# Patient Record
Sex: Male | Born: 1955 | Race: White | Hispanic: No | Marital: Single | Smoking: Current every day smoker
Health system: Southern US, Community
[De-identification: ages and names within clinical notes are randomized; demographics above are authoritative.]

## PROBLEM LIST (undated history)

## (undated) DIAGNOSIS — F209 Schizophrenia, unspecified: Secondary | ICD-10-CM

## (undated) DIAGNOSIS — F0390 Unspecified dementia without behavioral disturbance: Secondary | ICD-10-CM

## (undated) HISTORY — PX: SHOULDER SURGERY: SHX246

---

## 2002-03-22 ENCOUNTER — Encounter: Payer: Self-pay | Admitting: Endocrinology

## 2002-03-22 ENCOUNTER — Ambulatory Visit (HOSPITAL_COMMUNITY): Admission: RE | Admit: 2002-03-22 | Discharge: 2002-03-22 | Payer: Self-pay | Admitting: Endocrinology

## 2002-04-04 ENCOUNTER — Encounter: Payer: Self-pay | Admitting: Endocrinology

## 2002-04-04 ENCOUNTER — Ambulatory Visit (HOSPITAL_COMMUNITY): Admission: RE | Admit: 2002-04-04 | Discharge: 2002-04-04 | Payer: Self-pay | Admitting: Endocrinology

## 2004-01-15 ENCOUNTER — Ambulatory Visit: Payer: Self-pay | Admitting: Cardiology

## 2004-01-21 ENCOUNTER — Ambulatory Visit: Payer: Self-pay | Admitting: Cardiology

## 2004-01-29 ENCOUNTER — Ambulatory Visit: Payer: Self-pay | Admitting: Cardiology

## 2004-02-10 ENCOUNTER — Ambulatory Visit: Payer: Self-pay | Admitting: Cardiology

## 2004-03-30 ENCOUNTER — Ambulatory Visit: Payer: Self-pay | Admitting: Cardiology

## 2005-12-30 ENCOUNTER — Emergency Department (HOSPITAL_COMMUNITY): Admission: EM | Admit: 2005-12-30 | Discharge: 2005-12-30 | Payer: Self-pay | Admitting: Emergency Medicine

## 2007-10-19 ENCOUNTER — Ambulatory Visit: Payer: Self-pay | Admitting: Psychiatry

## 2007-10-19 ENCOUNTER — Inpatient Hospital Stay (HOSPITAL_COMMUNITY): Admission: AD | Admit: 2007-10-19 | Discharge: 2007-11-17 | Payer: Self-pay | Admitting: Psychiatry

## 2007-10-26 ENCOUNTER — Other Ambulatory Visit: Payer: Self-pay | Admitting: Emergency Medicine

## 2007-11-08 ENCOUNTER — Other Ambulatory Visit (HOSPITAL_COMMUNITY): Payer: Self-pay | Admitting: Psychiatry

## 2007-11-11 ENCOUNTER — Other Ambulatory Visit (HOSPITAL_COMMUNITY): Payer: Self-pay | Admitting: Psychiatry

## 2007-11-12 ENCOUNTER — Other Ambulatory Visit (HOSPITAL_COMMUNITY): Payer: Self-pay | Admitting: Psychiatry

## 2008-01-10 ENCOUNTER — Emergency Department (HOSPITAL_COMMUNITY): Admission: EM | Admit: 2008-01-10 | Discharge: 2008-01-10 | Payer: Self-pay | Admitting: Emergency Medicine

## 2008-09-14 ENCOUNTER — Ambulatory Visit: Payer: Self-pay | Admitting: Psychiatry

## 2008-09-14 ENCOUNTER — Other Ambulatory Visit: Payer: Self-pay

## 2008-09-15 ENCOUNTER — Inpatient Hospital Stay (HOSPITAL_COMMUNITY): Admission: AD | Admit: 2008-09-15 | Discharge: 2008-09-23 | Payer: Self-pay | Admitting: Psychiatry

## 2008-10-11 ENCOUNTER — Emergency Department (HOSPITAL_COMMUNITY): Admission: EM | Admit: 2008-10-11 | Discharge: 2008-10-11 | Payer: Self-pay | Admitting: Emergency Medicine

## 2009-03-22 ENCOUNTER — Other Ambulatory Visit: Payer: Self-pay | Admitting: Emergency Medicine

## 2009-03-22 ENCOUNTER — Ambulatory Visit: Payer: Self-pay | Admitting: Psychiatry

## 2009-03-23 ENCOUNTER — Inpatient Hospital Stay (HOSPITAL_COMMUNITY): Admission: AD | Admit: 2009-03-23 | Discharge: 2009-04-11 | Payer: Self-pay | Admitting: Psychiatry

## 2009-06-27 ENCOUNTER — Other Ambulatory Visit: Payer: Self-pay

## 2009-06-27 ENCOUNTER — Inpatient Hospital Stay (HOSPITAL_COMMUNITY): Admission: EM | Admit: 2009-06-27 | Discharge: 2009-07-25 | Payer: Self-pay | Admitting: Psychiatry

## 2009-06-27 ENCOUNTER — Ambulatory Visit: Payer: Self-pay | Admitting: Psychiatry

## 2009-08-11 ENCOUNTER — Other Ambulatory Visit: Payer: Self-pay | Admitting: Emergency Medicine

## 2009-08-12 ENCOUNTER — Ambulatory Visit: Payer: Self-pay | Admitting: Psychiatry

## 2009-08-12 ENCOUNTER — Inpatient Hospital Stay (HOSPITAL_COMMUNITY): Admission: AD | Admit: 2009-08-12 | Discharge: 2009-09-13 | Payer: Self-pay | Admitting: Psychiatry

## 2010-05-10 LAB — DIFFERENTIAL
Basophils Absolute: 0 10*3/uL (ref 0.0–0.1)
Basophils Absolute: 0 10*3/uL (ref 0.0–0.1)
Basophils Relative: 0 % (ref 0–1)
Eosinophils Absolute: 0 10*3/uL (ref 0.0–0.7)
Eosinophils Relative: 0 % (ref 0–5)
Eosinophils Relative: 0 % (ref 0–5)
Lymphocytes Relative: 10 % — ABNORMAL LOW (ref 12–46)
Lymphocytes Relative: 12 % (ref 12–46)
Lymphs Abs: 1 10*3/uL (ref 0.7–4.0)
Lymphs Abs: 1.5 10*3/uL (ref 0.7–4.0)
Monocytes Absolute: 0.4 10*3/uL (ref 0.1–1.0)
Monocytes Relative: 4 % (ref 3–12)
Neutro Abs: 8.7 10*3/uL — ABNORMAL HIGH (ref 1.7–7.7)
Neutro Abs: 9.8 10*3/uL — ABNORMAL HIGH (ref 1.7–7.7)
Neutrophils Relative %: 86 % — ABNORMAL HIGH (ref 43–77)
Neutrophils Relative %: 81 % — ABNORMAL HIGH (ref 43–77)

## 2010-05-10 LAB — BASIC METABOLIC PANEL
BUN: 3 mg/dL — ABNORMAL LOW (ref 6–23)
CO2: 28 mEq/L (ref 19–32)
Calcium: 9.6 mg/dL (ref 8.4–10.5)
Chloride: 104 mEq/L (ref 96–112)
Creatinine, Ser: 0.93 mg/dL (ref 0.4–1.5)
GFR calc Af Amer: >60 mL/min (ref >60)
GFR calc non Af Amer: >60 mL/min (ref >60)
Glucose, Bld: 143 mg/dL — ABNORMAL HIGH (ref 70–99)
Potassium: 3.6 mEq/L (ref 3.5–5.1)
Sodium: 140 mEq/L (ref 135–145)

## 2010-05-10 LAB — COMPREHENSIVE METABOLIC PANEL
AST: 65 U/L — ABNORMAL HIGH (ref 0–37)
BUN: 15 mg/dL (ref 6–23)
CO2: 28 mEq/L (ref 19–32)
Calcium: 9.9 mg/dL (ref 8.4–10.5)
Chloride: 103 mEq/L (ref 96–112)
Creatinine, Ser: 1.06 mg/dL (ref 0.4–1.5)
GFR calc Af Amer: >60 mL/min (ref >60)
GFR calc non Af Amer: >60 mL/min (ref >60)
Total Bilirubin: 1.4 mg/dL — ABNORMAL HIGH (ref 0.3–1.2)

## 2010-05-10 LAB — RAPID URINE DRUG SCREEN, HOSP PERFORMED
Amphetamines: NOT DETECTED
Amphetamines: NOT DETECTED
Barbiturates: NOT DETECTED
Barbiturates: NOT DETECTED
Benzodiazepines: NOT DETECTED
Benzodiazepines: POSITIVE — AB
Cocaine: NOT DETECTED
Cocaine: NOT DETECTED
Opiates: NOT DETECTED
Opiates: NOT DETECTED
Tetrahydrocannabinol: NOT DETECTED

## 2010-05-10 LAB — URINALYSIS, ROUTINE W REFLEX MICROSCOPIC
Bilirubin Urine: NEGATIVE
Glucose, UA: NEGATIVE mg/dL
Hgb urine dipstick: NEGATIVE
Ketones, ur: NEGATIVE mg/dL
Nitrite: NEGATIVE
Protein, ur: NEGATIVE mg/dL
Specific Gravity, Urine: <1.005 — ABNORMAL LOW (ref 1.005–1.030)
Urobilinogen, UA: 0.2 mg/dL (ref 0.0–1.0)
pH: 5.5 (ref 5.0–8.0)

## 2010-05-10 LAB — ACETAMINOPHEN LEVEL: Acetaminophen (Tylenol), Serum: <10 ug/mL — ABNORMAL LOW (ref 10–30)

## 2010-05-10 LAB — CBC
HCT: 49.3 % (ref 39.0–52.0)
HCT: 48 % (ref 39.0–52.0)
Hemoglobin: 17.1 g/dL — ABNORMAL HIGH (ref 13.0–17.0)
MCHC: 34.6 g/dL (ref 30.0–36.0)
MCHC: 34 g/dL (ref 30.0–36.0)
MCV: 91.4 fL (ref 78.0–100.0)
MCV: 92.4 fL (ref 78.0–100.0)
Platelets: 189 10*3/uL (ref 150–400)
RBC: 5.4 MIL/uL (ref 4.22–5.81)
RBC: 5.2 MIL/uL (ref 4.22–5.81)
RDW: 12.7 % (ref 11.5–15.5)
WBC: 10.1 10*3/uL (ref 4.0–10.5)
WBC: 12.1 10*3/uL — ABNORMAL HIGH (ref 4.0–10.5)

## 2010-05-10 LAB — ETHANOL: Alcohol, Ethyl (B): <5 mg/dL (ref 0–10)

## 2010-05-10 LAB — TSH: TSH: 19.697 u[IU]/mL — ABNORMAL HIGH (ref 0.350–4.500)

## 2010-05-12 LAB — BASIC METABOLIC PANEL
BUN: 7 mg/dL (ref 6–23)
CO2: 30 mEq/L (ref 19–32)
Chloride: 102 mEq/L (ref 96–112)
Creatinine, Ser: 0.76 mg/dL (ref 0.4–1.5)
Potassium: 3.9 mEq/L (ref 3.5–5.1)

## 2010-05-12 LAB — CBC
HCT: 44.6 % (ref 39.0–52.0)
MCHC: 35.7 g/dL (ref 30.0–36.0)
MCV: 91.8 fL (ref 78.0–100.0)
Platelets: 148 10*3/uL — ABNORMAL LOW (ref 150–400)
WBC: 5.1 10*3/uL (ref 4.0–10.5)

## 2010-05-12 LAB — DIFFERENTIAL
Basophils Relative: 0 % (ref 0–1)
Eosinophils Absolute: 0 10*3/uL (ref 0.0–0.7)
Eosinophils Relative: 1 % (ref 0–5)
Lymphs Abs: 1.3 10*3/uL (ref 0.7–4.0)
Monocytes Relative: 7 % (ref 3–12)

## 2010-05-12 LAB — GLUCOSE, RANDOM: Glucose, Bld: 138 mg/dL — ABNORMAL HIGH (ref 70–99)

## 2010-05-12 LAB — HEMOGLOBIN A1C
Hgb A1c MFr Bld: 5.8 % — ABNORMAL HIGH (ref <5.7)
Mean Plasma Glucose: 120 mg/dL — ABNORMAL HIGH (ref <117)

## 2010-05-12 LAB — RAPID URINE DRUG SCREEN, HOSP PERFORMED
Amphetamines: NOT DETECTED
Barbiturates: NOT DETECTED
Opiates: NOT DETECTED

## 2010-05-12 LAB — ETHANOL: Alcohol, Ethyl (B): <5 mg/dL (ref 0–10)

## 2010-05-12 LAB — TSH: TSH: 4.925 u[IU]/mL — ABNORMAL HIGH (ref 0.350–4.500)

## 2010-05-30 LAB — BASIC METABOLIC PANEL
CO2: 31 mEq/L (ref 19–32)
Calcium: 9.7 mg/dL (ref 8.4–10.5)
Chloride: 106 mEq/L (ref 96–112)
GFR calc Af Amer: >60 mL/min (ref >60)
Sodium: 142 mEq/L (ref 135–145)

## 2010-05-30 LAB — CBC
Hemoglobin: 16.1 g/dL (ref 13.0–17.0)
MCHC: 35 g/dL (ref 30.0–36.0)
RBC: 5.02 MIL/uL (ref 4.22–5.81)
WBC: 7.4 10*3/uL (ref 4.0–10.5)

## 2010-05-30 LAB — URINALYSIS, ROUTINE W REFLEX MICROSCOPIC
Bilirubin Urine: NEGATIVE
Hgb urine dipstick: NEGATIVE
Nitrite: NEGATIVE
Specific Gravity, Urine: 1.01 (ref 1.005–1.030)
pH: 7.5 (ref 5.0–8.0)

## 2010-05-30 LAB — ETHANOL: Alcohol, Ethyl (B): <5 mg/dL (ref 0–10)

## 2010-05-30 LAB — DIFFERENTIAL
Basophils Relative: 1 % (ref 0–1)
Lymphs Abs: 2.2 10*3/uL (ref 0.7–4.0)
Monocytes Absolute: 0.5 10*3/uL (ref 0.1–1.0)
Monocytes Relative: 7 % (ref 3–12)
Neutro Abs: 4.5 10*3/uL (ref 1.7–7.7)

## 2010-05-30 LAB — RAPID URINE DRUG SCREEN, HOSP PERFORMED
Barbiturates: NOT DETECTED
Opiates: NOT DETECTED

## 2010-05-31 LAB — DIFFERENTIAL
Basophils Absolute: 0 10*3/uL (ref 0.0–0.1)
Basophils Relative: 1 % (ref 0–1)
Eosinophils Relative: 2 % (ref 0–5)
Monocytes Absolute: 0.4 10*3/uL (ref 0.1–1.0)
Monocytes Relative: 5 % (ref 3–12)

## 2010-05-31 LAB — BASIC METABOLIC PANEL
CO2: 28 mEq/L (ref 19–32)
Calcium: 9.1 mg/dL (ref 8.4–10.5)
Chloride: 103 mEq/L (ref 96–112)
GFR calc Af Amer: >60 mL/min (ref >60)
Glucose, Bld: 127 mg/dL — ABNORMAL HIGH (ref 70–99)
Potassium: 3.6 mEq/L (ref 3.5–5.1)
Sodium: 139 mEq/L (ref 135–145)

## 2010-05-31 LAB — CBC
HCT: 45.4 % (ref 39.0–52.0)
Hemoglobin: 16 g/dL (ref 13.0–17.0)
MCHC: 35.2 g/dL (ref 30.0–36.0)
MCV: 91.5 fL (ref 78.0–100.0)
RBC: 4.96 MIL/uL (ref 4.22–5.81)
RDW: 14.2 % (ref 11.5–15.5)

## 2010-05-31 LAB — RAPID URINE DRUG SCREEN, HOSP PERFORMED
Cocaine: NOT DETECTED
Tetrahydrocannabinol: NOT DETECTED

## 2010-07-07 NOTE — Discharge Summary (Signed)
[name], MAURIELLO               ACCOUNT NO.:  [id number]   MEDICAL RECORD NO.:  [id number]          PATIENT TYPE:  IPS   LOCATION:  0401                          FACILITY:  BH   PHYSICIAN:  Anselm Jungling, MD  DATE OF BIRTH:  1955-04-26   DATE OF ADMISSION:  10/19/2007  DATE OF DISCHARGE:  10/26/2007                               DISCHARGE SUMMARY   IDENTIFYING DATA AND REASON FOR ADMISSION:  The patient is a 55 year old  single white male who was admitted due to exacerbation of symptoms of  chronic paranoid schizophrenia.  He came to [place] as a client of [facility] in [place].  He lives alone with this cat in a trailer,  and usually takes Prolixin prescribed by Dr. [place]ena Edis.  However, the  patient had been off his medication for an undetermined period of time.  The reasons for this were not clear.  Nonetheless, the patient presented  with strong auditory hallucinations and thought disorganization.  Please  refer to the admission note for further details pertaining to the  symptoms, circumstances and history that led to his hospitalization.  He  was given an initial Axis I diagnosis of schizophrenia, chronic paranoid  type, acute exacerbation.   MEDICAL AND LABORATORY:  The patient was medically and physically  assessed by the psychiatric nurse practitioner.  He was in good health  without any active or chronic medical problems.  There were no  significant medical issues.   HOSPITAL COURSE:  The patient was admitted to the adult inpatient  psychiatric service.  He presented as a well-nourished, normally-  developed, but disheveled male with disorganized thinking and impaired  communication.  He acknowledged auditory hallucinations.  He was  pleasant and cooperative, however.   We learned that he had received a Prolixin Decanoate injection on  approximately October 09, 2007, when his sister had taken him back to the  mental health center to get him caught up on his  medications.  Nonetheless, Prolixin Decanoate 25 mg was given here on October 21, 2007.  In addition, to more rapidly bring about his stabilization, he was  started on Prolixin orally 10 mg b.i.d.  He had prominent parkinsonian  EPS in response to this, so Cogentin was increased to 2 mg t.i.d. with  very good results.  The patient improved gradually over the week-long  hospitalization.  He was pleasant and cooperative throughout.  By the  eighth hospital day, he appeared appropriate for discharge.  He agreed  to the following aftercare plan.   AFTERCARE:  The patient was to follow up with Dr. [place]ena Edis at [facility],  with an appointment on October 31, 2007.   DISCHARGE MEDICATIONS:  1. Prolixin Decanoate 25 mg IM, last given October 21, 2007; next      dosage to be determined by Dr. [place]ena Edis.  2. Prolixin 10 mg b.i.d.  3. Cogentin 2 mg t.i.d.   DISCHARGE DIAGNOSES:  AXIS I:  Schizophrenia, chronic paranoid type,  acute exacerbation, resolving.  AXIS II:  Deferred.  AXIS III:  No acute or chronic illnesses.  AXIS IV:  Stressors severe.  AXIS V:  GAF on discharge 50.      Anselm Jungling, MD  Electronically Signed     SPB/MEDQ  D:  10/26/2007  T:  10/26/2007  Job:  [id number]

## 2010-07-07 NOTE — H&P (Signed)
NAMEARJAN, [name]               ACCOUNT NO.:  [id number]   MEDICAL RECORD NO.:  [id number]          PATIENT TYPE:  IPS   LOCATION:  0401                          FACILITY:  BH   PHYSICIAN:  Anselm Jungling, MD  DATE OF BIRTH:  10-30-1955   DATE OF ADMISSION:  10/19/2007  DATE OF DISCHARGE:                       PSYCHIATRIC ADMISSION ASSESSMENT   This is a voluntary admission to the services of Dr. Geralyn Flash.   IDENTIFYING INFORMATION:  This is a 55 year old single white male.  Why  is he here?  He states that the police came by and told me they had to  take me somewhere.  The commitment papers indicated that the patient  was actively psychotic and delusional.  He thought people were doing  things to him and that they needed to burn in hell.  He was not supposed  to be sleeping and pacing all night.  His sister is the one who took out  the commitment papers.  It indicated that he had recently been to [employer]. We called [facility], and they stated that he was admitted on  August 23, discharging on August 25.  His discharge medications were  Cogentin 1 mg b.i.d., Seroquel XR 200 mg nightly, Prolixin 5 mg p.o.  nightly, and Klonopin 0.5 mg p.r.n., no number of times a day given.  He  is also thought to get Prolixin Decanoate IM every three weeks, and they  think he is about six weeks out at this point.  He is known to be a  patient at [facility] [place].   SOCIAL HISTORY:  He reports that he graduated high school in 1976.  He  has never married.  He has no children.  He was last employed in the  [employer] in [place].  He lives alone with his cat in a trailer.   FAMILY HISTORY:  Negative.   ALCOHOL/DRUG HISTORY:  Negative.  Indeed, his UDS is completely clean,  and he has no alcohol.   PRIMARY CARE Natalyn Szymanowski:  Dr. Sherril Croon.   PSYCHIATRIST:  Dr. [place]ena Edis.  He can tell me all this.   MEDICAL PROBLEMS:  He has no known medical  problems.   MEDICATIONS:  He knows that he gets Prolixin 25 every 3 weeks.   DRUG ALLERGIES:  No known drug allergies.   POSITIVE PHYSICAL FINDINGS:  He was medically cleared in the ED at [facility].  He had no remarkable findings.  He has no physical complaints.  His vital signs on admission to the unit shows he is 68 inches tall.  He  weighs 180.  Temperature 98.1, blood pressure 134/82 to 110/79.  His  pulse was 115 to 107.  Respirations are 18.  MENTAL STATUS EXAM:  Tonight he is alert.  He was somewhat unkempt.  He  needs to shave and bathe.  He had appropriate clothing on.  He appears  to be adequately nourished.  His speech was brief and halting.  His mood  is anxiously depressed.  His affect is congruent.  He is somewhat  guarded.  Thought processes were clear, rationale, and goal-oriented.  Apparently, he has already had some Haldol today.  Judgment and insight  are fair.  Concentration and memory are superficially intact.  Intelligence is at least average.  He denies being suicidal or  homicidal.  He does not have any recall for any of the statements in the  commitment papers.  Earlier this morning, he did tell Dr. Electa Sniff that  he was hearing people talk to him, and he was religiously preoccupied.  He was found to be pleasant, soft-spoken, cooperative, and he agreed to  resume the Prolixin.   DIAGNOSES:   AXIS I:  Schizophrenic, paranoid.   AXIS II:  None known.   AXIS III:  None known.   AXIS IV:  Apparent noncompliance with medications post discharge.   AXIS V:  25.   PLAN:  Admit for safety and stabilization, to restart his medication,  and to get him back into [facility] as soon as possible.  Estimated length of stay is 3-5 days.      Mickie Leonarda Salon, P.A.-C.      Anselm Jungling, MD  Electronically Signed    MD/MEDQ  D:  10/20/2007  T:  10/20/2007  Job:  [id number]

## 2010-07-07 NOTE — H&P (Signed)
[name], [name]               ACCOUNT NO.:  [id number]   MEDICAL RECORD NO.:  [id number]          PATIENT TYPE:  IPS   LOCATION:  0402                          FACILITY:  BH   PHYSICIAN:  Anselm Jungling, MD  DATE OF BIRTH:  [birth year]   DATE OF ADMISSION:  09/15/2008  DATE OF DISCHARGE:                       PSYCHIATRIC ADMISSION ASSESSMENT   IDENTIFYING INFORMATION:  A 55 year old male, single.  This is a  voluntary admission.   HISTORY OF PRESENT ILLNESS:  Third [facility] admission for this 55 year old  with a history of chronic schizophrenia, paranoid type, who was brought  to the emergency room by his sister and brother-in-law who live across  the road from him.  He says he does not know why he is here.  Was told  that he was acting strange but really has no memory of the events.  Says  that he was minding his own business.  Mostly likes to smoke cigarettes  and watch TV.  The emergency room transcript notes that he was making  statements of having auditory hallucinations with voices telling him to  kill other people.  He denies any hallucinations today.   PAST PSYCHIATRIC HISTORY:  [facility] with [facility].  Currently is followed as an outpatient by mental health.  Reports he has  visited monthly at home for his injections.  Third [facility] admission.  History of schizophrenia, chronic paranoid type.  Has received Prolixin  Decanoate in the past.  Reports he is currently on Haldol Decanoate and  received an injection this past week, he believes.  Says that he drinks  alcohol rarely and denies any drug abuse.   SOCIAL HISTORY:  Never married.  No children.  Graduated high school in  1976.  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  Now disabled  due to his mental illness.   FAMILY HISTORY:  Not available.   HABITS:  Alcohol and drug history noted above.  He does also smoke  tobacco.   MEDICAL HISTORY:  Primary care practitioner, Dr. Sherril Croon, [facility].   MEDICAL PROBLEMS:  None.   PAST MEDICAL HISTORY:  Hyperthyroidism, stabilized.   CURRENT MEDICATIONS:  Benztropine 2 mg b.i.d., Haldol 25 mg IM once  monthly (last dose not known), Klonopin 0.5 mg b.i.d.  He received 20 mg  of Geodon in the emergency room and Ativan 4 mg in divided doses.   DRUG ALLERGIES:  None.   PHYSICAL EXAMINATION:  Physical exam was done in the emergency room as  noted in the record.   LABORATORY STUDIES:  CBC:  WBC 7.3, hemoglobin 16, hematocrit 45.4,  platelets 170,000.  Chemistry:  Sodium 139, potassium 3.6, chloride 103,  carbon dioxide 28, BUN 9, creatinine 0.86, and random glucose 127.  Alcohol level less than 5ive.  Urine drug screen positive for  benzodiazepines.   MENTAL STATUS EXAM:  Fully alert.  Blunted affect.  Somewhat dazed look.  Cooperative.  Good focus.  Asking why he is here.  Reporting that he  wants to go home.  Denying any hallucinations today.  Memory is  basically intact,  although he does not remember acting out or having any  problems with his behavior yesterday.  Insight limited.  No active  suicidal or homicidal thoughts.   DIAGNOSES:  AXIS I:     Schizophrenia, paranoid type, chronic, acute,  rule out acute exacerbation.  AXIS II:    No diagnosis.  AXIS III:   No diagnosis.  AXIS IV:    Deferred.  AXIS V:     Current 40.  Past year not known.   PLAN:  To voluntarily admit him to our intensive care unit for  stabilization.  We are going to start by getting in touch with his  sister and brother-in-law and just hear what their concerns were and why  they felt they needed to bring him to the hospital.  We will check with  mental health on his last dose of his Haldol and have provided Haldol  p.r.n. dose in case he does become agitated or have a recurrence of  hallucinations.  He is in our stabilization group team.      Young Berry. Lorin Picket, N.P.      Anselm Jungling, MD  Electronically Signed    MAS/MEDQ   D:  09/16/2008  T:  09/16/2008  Job:  [id number]

## 2010-07-10 NOTE — Discharge Summary (Signed)
[name], [name]               ACCOUNT NO.:  [id number]   MEDICAL RECORD NO.:  [id number]          PATIENT TYPE:  IPS   LOCATION:  0405                          FACILITY:  BH   PHYSICIAN:  Anselm Jungling, MD  DATE OF BIRTH:  [birth year]   DATE OF ADMISSION:  10/26/2007  DATE OF DISCHARGE:  11/17/2007                               DISCHARGE SUMMARY   IDENTIFYING DATA/REASON FOR ADMISSION:  The patient is a 55 year old  unmarried Caucasian male who was admitted for the second time in the  same month to the inpatient psychiatric service.  He came to [place] with a  long history of chronic schizophrenia.  He had a lengthy hospital stay  earlier during the month and was sent home to live in his usual  situation, in his own trailer, living alone with outpatient follow-up.  However, on the way home with his sister driving him, he became acutely  agitated and began making delusional statements about Satan.  He was  brought back and readmitted.  Please refer to the admission note for  further details pertaining to the symptoms, circumstances and history  that led to his hospitalization.  He was given an initial Axis I  diagnosis of schizophrenia, chronic paranoid type.   MEDICAL/LABORATORY:  The patient was medically and physically assessed  by the psychiatric nurse practitioner again.  He was in good health  without any active or chronic medical problems.   HOSPITAL COURSE:  The patient was readmitted to the inpatient service.  He presented as a well-nourished, adequately-developed adult male who  was disheveled, withdrawn, and anxious.  He gave very brief verbal  responses, but at times when it was possible to get  more verbal  response from him he tended to make delusional comments.  For instance,  he indicated that he had become frightened of going back to his home  after his last discharge because he stated his sister had planted a baby  snake in his home that he was supposed to have  sex with.   The patient had been previously treated with Prolixin over many years  and had done relatively well on that regimen.  In previous hospital stay  we renewed his Prolixin therapy anticipating that it would work as well  again as it had in the past.  With this rehospitalization we reexamined  our medication strategy and determined that Prolixin was probably not  likely to achieve an adequate level of recovery and that a trial of  Clozaril was indicated.  The patient was begun on Clozaril 25 mg daily  and it was increased in a stepwise fashion over the 3 weeks of his  inpatient stay.  It was well tolerated.  Over the course of his stay he  appeared to become more verbal, more spontaneous, and demonstrated more  affect.  We took this is a good sign of progress and response to  Clozaril.   Prior to discharge we made arrangements for him to be followed by the  [facility] and the patient and his  sister were  both in agreement about this.  He agreed to following the  aftercare plan.   AFTERCARE:  As above.  He was to begin working with [facility] on November 20, 2007.   DISCHARGE MEDICATIONS:  Clozaril 150 mg q.h.s., Prolixin 10 mg q.h.s.,  and Cogentin 2 mg t.i.d.   DISCHARGE DIAGNOSES:  AXIS I: Schizophrenia, chronic paranoid type,  chronic, resolving.  AXIS II: Deferred.  AXIS III: No acute or chronic illnesses.  AXIS IV: Stressors severe.  AXIS V: GAF on discharge 55.      Anselm Jungling, MD  Electronically Signed     SPB/MEDQ  D:  11/30/2007  T:  11/30/2007  Job:  [id number]

## 2010-07-10 NOTE — Discharge Summary (Signed)
[name], [name]               ACCOUNT NO.:  [id number]   MEDICAL RECORD NO.:  [id number]          PATIENT TYPE:  IPS   LOCATION:  0403                          FACILITY:  BH   PHYSICIAN:  Anselm Jungling, MD  DATE OF BIRTH:  08/25/1955   DATE OF ADMISSION:  09/15/2008  DATE OF DISCHARGE:  09/23/2008                               DISCHARGE SUMMARY   IDENTIFYING DATA AND REASON FOR ADMISSION:  This was an inpatient  psychiatric admission for [name], 55 year old single Caucasian male with  a long history of chronic schizophrenia.  He was admitted due to  exacerbation of symptoms.  Please refer to the admission note for  further details pertaining to symptoms, circumstances and history that  led to his hospitalization.  He was given an initial Axis I diagnosis of  schizophrenia, chronic paranoid type, acute exacerbation.   MEDICAL AND LABORATORY:  Nilay is in good health with a history of  hyperthyroidism, which apparently has been treated successfully in the  past.  There were no accidents, problems or issues.  His usual primary  care doctor is Dr. Sherril Croon.   HOSPITAL COURSE:  The patient was admitted to the adult inpatient  psychiatric service.  He presented as a disheveled, but normally-  developed adult male with obvious thought disorder including some  latency, thought blocking and disorganized thinking.   He has had previous admissions with [place], and in the those admissions he  had more flagrant paranoid delusions, but these were not in __________  at this time.   Per this admission, his family, who [place] beach vacation, dropped Ramondo off at the emergency department.  They reported that he was worsening, but it is not clear that that was  the case.  It seemed more likely that they were uncomfortable leaving  him home alone.   During his stay, Ivery was cooperative with medications including Haldol  and Cogentin.  He did report some  auditory hallucinations, but he  stated, They are not that bad.   Vonzell was generally pleasant and cooperative, although he kept to  himself and was rather withdrawn through most of his stay.   Zaquan did have a history of unpredictable behaviors in the past, some of  them dangerous, some of them at times when he otherwise appeared to be  calm and well compensated.  Because of this, it is somewhat  understandable that his family was not comfortable leaving him alone.  Also, the patient had been followed by the assertive community treatment  team.  However, because of the change in his funding to the better, due  to the apparently some back payment that he received with respect to  disability, rendered him disqualified for further acting services.  This  was unfortunate.   We contacted his family at the beach and made a decision that it would  be in the patient's best interest for him to remain with [place] until his  family returns.  Upon his family's return from their travels, that is,  approximately the ninth  hospital day, the patient appeared to be at  baseline level of functioning.  He and his family agreed to the  following aftercare plan.   AFTERCARE:  The patient is to follow up at [facility] in  [place], [place], with an appointment on August 4 at 8 a.m.   DISCHARGE MEDICATIONS:  1. Haldol Decanoate 50 mg IM q.4 weeks, last given on July 8.  2. Haldol 10 mg p.o. nightly.  3. Cogentin 2 mg b.i.d.   DISCHARGE DIAGNOSES:  Axis I: Schizophrenia, chronic paranoid type,  acute exacerbation, resolving.  Axis II: Deferred.  Axis III: No acute or chronic illnesses.  Axis IV: Stressors, severe.  Axis V:  Global Assessment of Functioning on discharge 55.      Anselm Jungling, MD  Electronically Signed     SPB/MEDQ  D:  09/25/2008  T:  09/25/2008  Job:  [id number]

## 2010-11-23 LAB — CBC
HCT: 38.5 — ABNORMAL LOW
HCT: 40.5
MCV: 90.8
MCV: 91
Platelets: 197
Platelets: 210
RBC: 4.24
RDW: 14.4
WBC: 5.6

## 2010-11-23 LAB — DIFFERENTIAL
Basophils Absolute: 0
Basophils Relative: 1
Eosinophils Absolute: 0.1
Eosinophils Absolute: 0.1
Eosinophils Relative: 1
Eosinophils Relative: 2
Lymphocytes Relative: 40
Lymphs Abs: 2.2
Monocytes Relative: 9
Neutrophils Relative %: 49
Neutrophils Relative %: 49

## 2010-11-23 LAB — HEPATIC FUNCTION PANEL
ALT: 30
Albumin: 4.1
Alkaline Phosphatase: 59
Total Bilirubin: 1.1
Total Protein: 7.2

## 2010-11-24 LAB — DIFFERENTIAL
Basophils Relative: 1
Eosinophils Relative: 1
Monocytes Absolute: 0.3
Monocytes Relative: 5
Neutro Abs: 3.6

## 2010-11-24 LAB — CBC
HCT: 46.7
Hemoglobin: 16
MCHC: 34.3
RBC: 5.27

## 2010-11-24 LAB — BASIC METABOLIC PANEL
CO2: 30
Calcium: 9.3
Chloride: 107
GFR calc Af Amer: >60
Potassium: 3.8
Sodium: 140

## 2010-11-24 LAB — RAPID URINE DRUG SCREEN, HOSP PERFORMED
Barbiturates: NOT DETECTED
Opiates: NOT DETECTED

## 2010-11-25 LAB — URINALYSIS, ROUTINE W REFLEX MICROSCOPIC
Bilirubin Urine: NEGATIVE
Hgb urine dipstick: NEGATIVE
Protein, ur: NEGATIVE
Urobilinogen, UA: 0.2

## 2010-11-25 LAB — CBC
MCHC: 34.9
MCV: 89.7
Platelets: 239
RDW: 14.9

## 2010-11-25 LAB — RAPID URINE DRUG SCREEN, HOSP PERFORMED
Amphetamines: NOT DETECTED
Barbiturates: NOT DETECTED
Benzodiazepines: NOT DETECTED
Opiates: NOT DETECTED
Tetrahydrocannabinol: NOT DETECTED

## 2010-11-25 LAB — BASIC METABOLIC PANEL
BUN: 7
CO2: 27
Chloride: 104
Creatinine, Ser: 0.87

## 2010-11-25 LAB — DIFFERENTIAL
Basophils Relative: 1
Eosinophils Absolute: 0
Neutrophils Relative %: 52

## 2012-09-16 DIAGNOSIS — E079 Disorder of thyroid, unspecified: Secondary | ICD-10-CM

## 2012-09-20 ENCOUNTER — Emergency Department (HOSPITAL_COMMUNITY): Admission: EM | Admit: 2012-09-20 | Discharge: 2012-09-21 | Payer: Medicare Other | Source: Home / Self Care

## 2012-09-20 ENCOUNTER — Encounter (HOSPITAL_COMMUNITY): Payer: Self-pay | Admitting: Emergency Medicine

## 2012-09-20 DIAGNOSIS — F17200 Nicotine dependence, unspecified, uncomplicated: Secondary | ICD-10-CM | POA: Insufficient documentation

## 2012-09-20 DIAGNOSIS — Z79899 Other long term (current) drug therapy: Secondary | ICD-10-CM

## 2012-09-20 DIAGNOSIS — F200 Paranoid schizophrenia: Principal | ICD-10-CM | POA: Diagnosis present

## 2012-09-20 DIAGNOSIS — F29 Unspecified psychosis not due to a substance or known physiological condition: Secondary | ICD-10-CM | POA: Insufficient documentation

## 2012-09-20 DIAGNOSIS — Z8659 Personal history of other mental and behavioral disorders: Secondary | ICD-10-CM | POA: Insufficient documentation

## 2012-09-20 HISTORY — DX: Schizophrenia, unspecified: F20.9

## 2012-09-20 LAB — COMPREHENSIVE METABOLIC PANEL
ALT: 15 U/L (ref 0–53)
AST: 18 U/L (ref 0–37)
Albumin: 4.2 g/dL (ref 3.5–5.2)
Alkaline Phosphatase: 52 U/L (ref 39–117)
GFR calc Af Amer: >90 mL/min (ref >90)
Glucose, Bld: 134 mg/dL — ABNORMAL HIGH (ref 70–99)
Potassium: 3.6 mEq/L (ref 3.5–5.1)
Sodium: 136 mEq/L (ref 135–145)
Total Protein: 8 g/dL (ref 6.0–8.3)

## 2012-09-20 LAB — CBC WITH DIFFERENTIAL/PLATELET
Eosinophils Absolute: 0.1 10*3/uL (ref 0.0–0.7)
Lymphocytes Relative: 19 % (ref 12–46)
Lymphs Abs: 2 10*3/uL (ref 0.7–4.0)
MCH: 33.2 pg (ref 26.0–34.0)
Neutrophils Relative %: 72 % (ref 43–77)
Platelets: 175 10*3/uL (ref 150–400)
RBC: 5.15 MIL/uL (ref 4.22–5.81)
WBC: 10 10*3/uL (ref 4.0–10.5)

## 2012-09-20 LAB — ETHANOL: Alcohol, Ethyl (B): <11 mg/dL (ref 0–11)

## 2012-09-20 MED ORDER — LORAZEPAM 1 MG PO TABS
2.0000 mg | ORAL_TABLET | Freq: Four times a day (QID) | ORAL | Status: DC | PRN
  Administered 2012-09-21: 2 mg via ORAL
  Filled 2012-09-20 (×3): qty 2

## 2012-09-20 MED ORDER — LEVOTHYROXINE SODIUM 75 MCG PO TABS
75.0000 ug | ORAL_TABLET | Freq: Every day | ORAL | Status: DC
  Administered 2012-09-21: 75 ug via ORAL
  Filled 2012-09-20 (×3): qty 1

## 2012-09-20 MED ORDER — CLONAZEPAM 0.5 MG PO TABS
1.0000 mg | ORAL_TABLET | Freq: Every day | ORAL | Status: DC
  Administered 2012-09-20: 1 mg via ORAL
  Filled 2012-09-20: qty 2

## 2012-09-20 MED ORDER — ZIPRASIDONE MESYLATE 20 MG IM SOLR: 20.0000 mg | Freq: Two times a day (BID) | INTRAMUSCULAR | Status: DC | PRN

## 2012-09-20 MED ORDER — FLUPHENAZINE HCL 5 MG PO TABS
30.0000 mg | ORAL_TABLET | Freq: Every day | ORAL | Status: DC
  Administered 2012-09-20: 30 mg via ORAL
  Filled 2012-09-20 (×2): qty 3

## 2012-09-20 MED ORDER — BENZTROPINE MESYLATE 1 MG PO TABS
2.0000 mg | ORAL_TABLET | Freq: Two times a day (BID) | ORAL | Status: DC
  Administered 2012-09-20 – 2012-09-21 (×2): 2 mg via ORAL
  Filled 2012-09-20 (×2): qty 2

## 2012-09-20 MED ORDER — TRAZODONE HCL 50 MG PO TABS
200.0000 mg | ORAL_TABLET | Freq: Every day | ORAL | Status: DC
  Administered 2012-09-20: 200 mg via ORAL
  Filled 2012-09-20 (×2): qty 2

## 2012-09-20 MED ORDER — PANTOPRAZOLE SODIUM 40 MG PO TBEC
40.0000 mg | DELAYED_RELEASE_TABLET | Freq: Every day | ORAL | Status: DC
  Administered 2012-09-21: 40 mg via ORAL
  Filled 2012-09-20 (×2): qty 1

## 2012-09-20 NOTE — ED Notes (Signed)
Pt had flat affect, laying in bed with arms crossed, denies wanting to hurt himself or anyone else. Pt states his sister was concerned about him and call for him to get a check up.

## 2012-09-20 NOTE — ED Provider Notes (Signed)
  CSN: [id number]     Arrival date & time 09/20/12  2022 History     First MD Initiated Contact with Patient 09/20/12 2023     Chief Complaint  Patient presents with  . V70.1   (Consider location/radiation/quality/duration/timing/severity/associated sxs/prior Treatment) HPI Comments: Patient was brought here by EMS after his sister called 911 complaining that he is behaving erratically.  He has a history of psychiatric issues.  His cogentin was recently changed and he is not behaving normally.  Per the sister, he is talking to himself and "acting like a robot".  His walking around in circles and not taking care of himself.  She is worried that he may wander off and get hurt.  He denies to me he is having any pain or other symptoms.  He denies any recent drug or alcohol use.  According to the sister he has had a prior admission to [facility] in 2011.  He was also recently seen at [facility] and sent home after an extended stay in the ED.  The history is provided by the patient.    Past Medical History  Diagnosis Date  . Schizophrenia    Past Surgical History  Procedure Laterality Date  . Shoulder surgery     No family history on file. History  Substance Use Topics  . Smoking status: Current Every Day Smoker -- 1.00 packs/day    Types: Cigarettes  . Smokeless tobacco: Not on file  . Alcohol Use: Yes     Comment: occasionaly    Review of Systems  All other systems reviewed and are negative.    Allergies  Review of patient's allergies indicates no known allergies.  Home Medications  No current outpatient prescriptions on file. There were no vitals taken for this visit. Physical Exam  Nursing note and vitals reviewed. Constitutional: He is oriented to person, place, and time. He appears well-developed and well-nourished.  Patient appears somewhat disheveled and unkepmt.  He is otherwise alert, and appears oriented to person, place, time, and somewhat to  situation.  HENT:  Head: Normocephalic and atraumatic.  Mouth/Throat: Oropharynx is clear and moist.  Eyes: Pupils are equal, round, and reactive to light.  Neck: Normal range of motion. Neck supple.  Cardiovascular: Normal rate, regular rhythm and normal heart sounds.   No murmur heard. Pulmonary/Chest: Effort normal and breath sounds normal. No respiratory distress. He has no wheezes.  Abdominal: Soft. Bowel sounds are normal. He exhibits no distension. There is no tenderness.  Musculoskeletal: Normal range of motion. He exhibits no edema.  Neurological: He is alert and oriented to person, place, and time. No cranial nerve deficit. He exhibits normal muscle tone. Coordination normal.  His movements are slow and drawn out.    Skin: Skin is warm and dry.  Psychiatric: His affect is blunt. His speech is delayed. He is slowed. He expresses no homicidal and no suicidal ideation. He expresses no suicidal plans and no homicidal plans.    ED Course   Procedures (including critical care time)  Labs Reviewed  CBC WITH DIFFERENTIAL  COMPREHENSIVE METABOLIC PANEL  URINE RAPID DRUG SCREEN (HOSP PERFORMED)  ETHANOL  URINALYSIS, ROUTINE W REFLEX MICROSCOPIC   No results found. No diagnosis found.  MDM  The patient appears somewhat disheveled and withdrawn.  He is slow to communicate.  He is seen talking to himself in the ED.  Medical workup initiated, telepsych consult placed.    Geoffery Lyons, MD 09/21/12 1054

## 2012-09-20 NOTE — ED Notes (Signed)
Assumed care of patient at this time.. Labs and urine ordered. Pt changed into paper scrubs. Belongings searched and place in belongings bag and labels and secured in nursing station. MD at the bedside. Pt is not suicidal.

## 2012-09-20 NOTE — ED Notes (Signed)
Sister [name] [phone].  Called to check on pt, pt lives with her.

## 2012-09-20 NOTE — ED Notes (Signed)
Pt states he is hungry, Steward Drone found meal tray for pt. Pt sitting on side of bed eating.

## 2012-09-20 NOTE — ED Notes (Signed)
Telepsych being done at the bedside

## 2012-09-20 NOTE — ED Notes (Signed)
Pt ambulatory to the bathroom. Heard commode flush, pt did not get specimen in cup. Pt said he would try again next time. Pt back in room in bed, laying down.

## 2012-09-20 NOTE — ED Provider Notes (Signed)
Tele psych consult has been completed. It is recommended that the patient be admitted to psychiatry unit. Patient will be given his normal medications, await placement.  Gilda Crease, MD 09/20/12 2228

## 2012-09-20 NOTE — ED Notes (Signed)
Pt ask if I knew anyone that had any sex instruments. Was told No, ask why he wanted to know, he did not know, was just asking.

## 2012-09-20 NOTE — ED Notes (Signed)
Sister called EMS, states pt had his medication adjusted 2 weeks ago and states he is acting strange, like a robot.

## 2012-09-21 ENCOUNTER — Encounter (HOSPITAL_COMMUNITY): Payer: Self-pay

## 2012-09-21 ENCOUNTER — Encounter (HOSPITAL_COMMUNITY): Payer: Self-pay | Admitting: Behavioral Health

## 2012-09-21 ENCOUNTER — Inpatient Hospital Stay (HOSPITAL_COMMUNITY)
Admission: AD | Admit: 2012-09-21 | Discharge: 2012-09-28 | DRG: 885 | Disposition: A | Discharge status: Home / Self Care | Payer: Medicare Other | Source: Intra-hospital | Attending: Psychiatry | Admitting: Psychiatry

## 2012-09-21 DIAGNOSIS — F200 Paranoid schizophrenia: Principal | ICD-10-CM

## 2012-09-21 LAB — URINALYSIS, ROUTINE W REFLEX MICROSCOPIC
Hgb urine dipstick: NEGATIVE
Ketones, ur: NEGATIVE mg/dL
Protein, ur: NEGATIVE mg/dL
Urobilinogen, UA: 1 mg/dL (ref 0.0–1.0)

## 2012-09-21 LAB — RAPID URINE DRUG SCREEN, HOSP PERFORMED
Amphetamines: NOT DETECTED
Barbiturates: NOT DETECTED
Tetrahydrocannabinol: NOT DETECTED

## 2012-09-21 MED ORDER — NICOTINE 21 MG/24HR TD PT24
21.0000 mg | MEDICATED_PATCH | Freq: Every day | TRANSDERMAL | Status: DC
  Administered 2012-09-22 – 2012-09-28 (×7): 21 mg via TRANSDERMAL
  Filled 2012-09-21 (×10): qty 1

## 2012-09-21 MED ORDER — PANTOPRAZOLE SODIUM 40 MG PO TBEC
40.0000 mg | DELAYED_RELEASE_TABLET | Freq: Every day | ORAL | Status: DC
  Administered 2012-09-22 – 2012-09-28 (×7): 40 mg via ORAL
  Filled 2012-09-21 (×12): qty 1

## 2012-09-21 MED ORDER — TRAZODONE HCL 100 MG PO TABS
200.0000 mg | ORAL_TABLET | Freq: Every day | ORAL | Status: DC
  Administered 2012-09-21 – 2012-09-27 (×7): 200 mg via ORAL
  Filled 2012-09-21 (×5): qty 2
  Filled 2012-09-21: qty 4
  Filled 2012-09-21 (×2): qty 2
  Filled 2012-09-21: qty 4
  Filled 2012-09-21 (×2): qty 2

## 2012-09-21 MED ORDER — MAGNESIUM HYDROXIDE 400 MG/5ML PO SUSP: 30.0000 mL | Freq: Every day | ORAL | Status: DC | PRN

## 2012-09-21 MED ORDER — LEVOTHYROXINE SODIUM 75 MCG PO TABS
75.0000 ug | ORAL_TABLET | Freq: Every day | ORAL | Status: DC
  Administered 2012-09-22 – 2012-09-28 (×7): 75 ug via ORAL
  Filled 2012-09-21 (×10): qty 1

## 2012-09-21 MED ORDER — ZIPRASIDONE MESYLATE 20 MG IM SOLR: 20.0000 mg | Freq: Two times a day (BID) | INTRAMUSCULAR | Status: DC | PRN

## 2012-09-21 MED ORDER — CLONAZEPAM 1 MG PO TABS
1.0000 mg | ORAL_TABLET | Freq: Every day | ORAL | Status: DC
  Administered 2012-09-21 – 2012-09-27 (×7): 1 mg via ORAL
  Filled 2012-09-21 (×7): qty 1

## 2012-09-21 MED ORDER — ALUM & MAG HYDROXIDE-SIMETH 200-200-20 MG/5ML PO SUSP: 30.0000 mL | ORAL | Status: DC | PRN

## 2012-09-21 MED ORDER — FLUPHENAZINE HCL 5 MG PO TABS
30.0000 mg | ORAL_TABLET | Freq: Every day | ORAL | Status: DC
  Administered 2012-09-21 – 2012-09-27 (×7): 30 mg via ORAL
  Filled 2012-09-21 (×2): qty 3
  Filled 2012-09-21: qty 4
  Filled 2012-09-21: qty 2
  Filled 2012-09-21 (×2): qty 3
  Filled 2012-09-21: qty 12
  Filled 2012-09-21 (×2): qty 3
  Filled 2012-09-21: qty 12
  Filled 2012-09-21 (×2): qty 3

## 2012-09-21 MED ORDER — BENZTROPINE MESYLATE 2 MG PO TABS
2.0000 mg | ORAL_TABLET | Freq: Two times a day (BID) | ORAL | Status: DC
  Administered 2012-09-21 – 2012-09-22 (×2): 2 mg via ORAL
  Filled 2012-09-21: qty 2
  Filled 2012-09-21 (×4): qty 1
  Filled 2012-09-21: qty 2

## 2012-09-21 MED ORDER — ACETAMINOPHEN 325 MG PO TABS: 650.0000 mg | ORAL_TABLET | Freq: Four times a day (QID) | ORAL | Status: DC | PRN

## 2012-09-21 NOTE — ED Notes (Signed)
Ambulatory to restroom with steady gait.

## 2012-09-21 NOTE — ED Provider Notes (Signed)
Pt accepted at [facility]. Bernette Mayers, MD 09/21/12 [phone]

## 2012-09-21 NOTE — Progress Notes (Signed)
Admission Note  D: Patient admitted to [facility] from [facility]. Patient is pleasant and cooperative. Per report, the patient was observed having erratic behavior and talking to himself, verbalizing that the doctor is a robot. Also, when speaking to self, the patient stated, "he's talking to God". During admission, pt. was logical and coherent. Per report, patient has a history of schizophrenia and recent change in medications.  A: Support and encouragement provided to patient. Oriented patient to the unit and informed of the rules/policies. Initiated Q15 minute checks for safety.  R: Patient receptive. Denies SI/HI/AVH. Patient remains safe on the unit.

## 2012-09-21 NOTE — ED Notes (Signed)
Patient up walking around in room talking to himself. Assist to restroom continue to be unable to urinate just a few drops.

## 2012-09-21 NOTE — BH Assessment (Signed)
Assessment Note   [name] is an 57 y.o. male.   Brought to ED by his sister who observed erratic behavior, talking to self, and pacing in circles. Pt has history of Schizophrenia and medications have been changed recently. On admission he told the ED physician that he is a robot. He feels that he is a robot because, "I broke my shoulder one time". He hears voices that he identifies as "God's voice" and heard God talking to him this morning. He has seen God before, but not recently. Pt is alert, oriented to self only, believes that he is still in a group home. He is cooperative and able to do ADLs with set up. He appears depressed and disheveled, but denies depression. States that he sleeps six hours a night and his appetite is good. Admission status changed from voluntary to involuntary for safety. Telepsych advised inpatient admission for stabilization of Schizophrenia. Plan is to submit for admission to [facility].   Axis I: Psychotic Disorder NOS Axis II: Deferred Axis III:  Past Medical History  Diagnosis Date  . Schizophrenia    Axis IV: medication changes Axis V: 11-20 some danger of hurting self or others possible OR occasionally fails to maintain minimal personal hygiene OR gross impairment in communication  Past Medical History:  Past Medical History  Diagnosis Date  . Schizophrenia     Past Surgical History  Procedure Laterality Date  . Shoulder surgery      Family History: No family history on file.  Social History:  reports that he has been smoking Cigarettes.  He has been smoking about 1.00 pack per day. He does not have any smokeless tobacco history on file. He reports that  drinks alcohol. He reports that he does not use illicit drugs.  Additional Social History:     CIWA: CIWA-Ar BP: 119/55 mmHg Pulse Rate: 99 COWS:    Allergies: No Known Allergies  Home Medications:  (Not in a hospital admission)  OB/GYN Status:  No LMP for male patient.  General  Assessment Data Location of Assessment: [facility] ACT Assessment: Yes Living Arrangements: Other relatives Can pt return to current living arrangement?: Yes Admission Status: Involuntary Is patient capable of signing voluntary admission?: No Transfer from: Home Referral Source: MD  Education Status Is patient currently in school?: No  Risk to self Suicidal Ideation: No Suicidal Intent: No Is patient at risk for suicide?: No Suicidal Plan?: No What has been your use of drugs/alcohol within the last 12 months?: denies Other Self Harm Risks: psychosis Family Suicide History: Unknown Recent stressful life event(s): Other (Comment) (medication changes) Persecutory voices/beliefs?: Yes Depression: No Substance abuse history and/or treatment for substance abuse?: No Suicide prevention information given to non-admitted patients: Not applicable  Risk to Others Homicidal Ideation: No Thoughts of Harm to Others: No Current Homicidal Intent: No Current Homicidal Plan: No Access to Homicidal Means: No History of harm to others?: No Assessment of Violence: None Noted Does patient have access to weapons?: No Criminal Charges Pending?: No Does patient have a court date: No  Psychosis Hallucinations: Auditory Delusions: Grandiose  Mental Status Report Appear/Hygiene: Disheveled Eye Contact: Fair Motor Activity: Freedom of movement Speech: Tangential;Incoherent Level of Consciousness: Alert Mood: Depressed Affect: Blunted Anxiety Level: None Thought Processes: Tangential;Flight of Ideas Judgement: Impaired Orientation: Person Obsessive Compulsive Thoughts/Behaviors: Severe  Cognitive Functioning Concentration: Decreased Memory: Recent Impaired;Remote Impaired IQ: Average Insight: Poor Impulse Control: Fair Appetite: Good Sleep: No Change Total Hours of Sleep: 6 Vegetative  Symptoms: None  ADLScreening [facility] Assessment Services) Patient's cognitive ability adequate to  safely complete daily activities?: No Patient able to express need for assistance with ADLs?: Yes Independently performs ADLs?: Yes (appropriate for developmental age)  Abuse/Neglect [facility]) Physical Abuse: Denies Verbal Abuse: Denies Sexual Abuse: Denies  Prior Inpatient Therapy Prior Inpatient Therapy: Yes Prior Therapy Dates: unknown  Prior Outpatient Therapy Prior Outpatient Therapy: Yes  ADL Screening (condition at time of admission) Patient's cognitive ability adequate to safely complete daily activities?: No Patient able to express need for assistance with ADLs?: Yes Independently performs ADLs?: Yes (appropriate for developmental age)       Abuse/Neglect Assessment (Assessment to be complete while patient is alone) Physical Abuse: Denies Verbal Abuse: Denies Sexual Abuse: Denies Values / Beliefs Cultural Requests During Hospitalization: None Spiritual Requests During Hospitalization: None        Additional Information CIRT Risk: No Elopement Risk: Yes Does patient have medical clearance?: Yes     Disposition:  Disposition Initial Assessment Completed for this Encounter: Yes Disposition of Patient: Inpatient treatment program Type of inpatient treatment program: Adult  On Site Evaluation by:   Reviewed with Physician:     Genia Del 09/21/2012 12:22 PM

## 2012-09-21 NOTE — ED Notes (Signed)
rpd called at this time to pick up ivc pt at this time going to [facility]. [name]

## 2012-09-21 NOTE — ED Notes (Addendum)
Patient is talking, making promises to quit smoking, states he is talking to God. Stated he would quit smoking if God would give him one last cigarette before bed. Pt stated is God is talking to him. Pt is now yelling stop talking like that Renae Fickle, " I will break your neck" . Ask pt who is Renae Fickle? He stated his daddy.

## 2012-09-21 NOTE — ED Notes (Signed)
Patient assist to restroom, unable to urinate at this time

## 2012-09-21 NOTE — Progress Notes (Signed)
Patient ID: [name], male   DOB: 08/31/1955, 57 y.o.   MRN: [id number]  D: Pt denies SI/HI/AVH. Pt is pleasant and cooperative. Pt lying in bed upon approach. Pt did not go to group tonight because he said he wanted to sleep. Pt was incoherent at times but redirectable. Pt states " I will take my meds when I get out of here, my sister gets them for me"   A: Pt was offered support and encouragement. Pt was given scheduled medications. Pt was encourage to attend groups. Q 15 minute checks were done for safety.   R:Pt  interacts well with peers and staff. Pt is taking medication. Pt has no complaints at this time.Pt receptive to treatment and safety maintained on unit.

## 2012-09-21 NOTE — ED Notes (Signed)
Pt is sleeping

## 2012-09-21 NOTE — ED Notes (Signed)
Pt given meal tray.

## 2012-09-21 NOTE — Tx Team (Signed)
Initial Interdisciplinary Treatment Plan  PATIENT STRENGTHS: (choose at least two) Ability for insight General fund of knowledge Motivation for treatment/growth  PATIENT STRESSORS: Health problems Medication change or noncompliance   PROBLEM LIST: Problem List/Patient Goals Date to be addressed Date deferred Reason deferred Estimated date of resolution  History of Schizophrenia      Medication Non-compliance                                                 DISCHARGE CRITERIA:  Ability to meet basic life and health needs Improved stabilization in mood, thinking, and/or behavior Motivation to continue treatment in a less acute level of care  PRELIMINARY DISCHARGE PLAN: Attend aftercare/continuing care group Outpatient therapy Return to previous living arrangement  PATIENT/FAMIILY INVOLVEMENT: This treatment plan has been presented to and reviewed with the patient, [name].  The patient and family have been given the opportunity to ask questions and make suggestions.  Harold Barban E 09/21/2012, 5:50 PM

## 2012-09-22 ENCOUNTER — Encounter (HOSPITAL_COMMUNITY): Payer: Self-pay | Admitting: Psychiatry

## 2012-09-22 DIAGNOSIS — F200 Paranoid schizophrenia: Principal | ICD-10-CM | POA: Diagnosis present

## 2012-09-22 MED ORDER — BENZTROPINE MESYLATE 2 MG PO TABS
2.0000 mg | ORAL_TABLET | Freq: Every day | ORAL | Status: DC
  Administered 2012-09-22 – 2012-09-27 (×6): 2 mg via ORAL
  Filled 2012-09-22: qty 1
  Filled 2012-09-22: qty 2
  Filled 2012-09-22 (×3): qty 1
  Filled 2012-09-22: qty 2
  Filled 2012-09-22 (×3): qty 1

## 2012-09-22 NOTE — Progress Notes (Signed)
Patient ID: [name], male   DOB: September 01, 1955, 57 y.o.   MRN: [id number]  D: Pt denies SI/HI/AV. Pt is pleasant and cooperative. "good day, enjoyed Karaoke last night. I got a phone call today I may be going home Monday."  A: Pt was offered support and encouragement. Pt was given scheduled medications. Pt was encourage to attend groups. Q 15 minute checks were done for safety.   R:Pt attends groups and interacts well with peers and staff. Pt is taking medication. Pt has no complaints at this time.Pt receptive to treatment and safety maintained on unit.

## 2012-09-22 NOTE — [facility] Group Notes (Signed)
[facility] LCSW Aftercare Discharge Planning Group Note   09/22/2012 7:58 AM  Participation Quality:  Engaged  Mood/Affect:  Flat  Depression Rating:  denies  Anxiety Rating:  denies  Thoughts of Suicide:  No Will you contract for safety?   NA  Current AVH:  No  Plan for Discharge/Comments:  States he is here because his sister was concerned about him.  When asked why, said it was because of his caffeine and nicotine intake, "but I quit both yesterday."  Limited insight.  Sees Dr Betti Cruz at [facility].  Says he was not sleeping well, and trazodone was added at his last visit.  Transportation Means: family  Supports: family  [name], [name]

## 2012-09-22 NOTE — Tx Team (Signed)
  Interdisciplinary Treatment Plan Update   Date Reviewed:  09/22/2012  Time Reviewed:  7:58 AM  Progress in Treatment:   Attending groups: Yes Participating in groups: Yes Taking medication as prescribed: Yes  Tolerating medication: Yes Family/Significant other contact made: No Patient understands diagnosis: No  Limited insight  Discussing patient identified problems/goals with staff: Yes  See initial plan Medical problems stabilized or resolved: Yes Denies suicidal/homicidal ideation: Yes  In tx team Patient has not harmed self or others: Yes  For review of initial/current patient goals, please see plan of care.  Estimated Length of Stay:  4-5 days  Reason for Continuation of Hospitalization: Hallucinations Medication stabilization  New Problems/Goals identified:  N/A  Discharge Plan or Barriers:   return home, follow up outpt  Additional Comments: Brought to ED by his sister who observed erratic behavior, talking to self, and pacing in circles. Pt has history of Schizophrenia and medications have been changed recently. On admission he told the ED physician that he is a robot. He feels that he is a robot because, "I broke my shoulder one time". He hears voices that he identifies as "God's voice" and heard God talking to him this morning. He has seen God before, but not recently. Pt is alert, oriented to self only, believes that he is still in a group home    Attendees:  Signature: Thedore Mins, MD 09/22/2012 7:58 AM   Signature: Richelle Ito, LCSW 09/22/2012 7:58 AM  Signature: Fransisca Kaufmann, NP 09/22/2012 7:58 AM  Signature: Nestor Ramp, RN  09/22/2012 7:58 AM  Signature:  09/22/2012 7:58 AM  Signature:  09/22/2012 7:58 AM  Signature:   09/22/2012 7:58 AM  Signature:    Signature:    Signature:    Signature:    Signature:    Signature:      Scribe for Treatment Team:   Richelle Ito, LCSW  09/22/2012 7:58 AM

## 2012-09-22 NOTE — BHH Counselor (Signed)
Adult Comprehensive Assessment  Patient ID: OMIR COOPRIDER, male   DOB: [birth year], 56 y.o.   MRN: [id number]  Information Source:    Current Stressors:  Educational / Learning stressors: N/A Employment / Job issues: Yes  Occupational Family Relationships: N/A Surveyor, quantity / Lack of resources (include bankruptcy): N/A Housing / Lack of housing: N/A Physical health (include injuries & life threatening diseases): N/A Social relationships: Yes  None Substance abuse: N/A Bereavement / Loss: N/A  Living/Environment/Situation:  Living Arrangements: Alone Living conditions (as described by patient or guardian): trailer on family property How long has patient lived in current situation?: 2 years-previous to that in different trailer What is atmosphere in current home: Comfortable  Family History:  Marital status: Single Does patient have children?: No  Childhood History:  By whom was/is the patient raised?: Both parents Description of patient's relationship with caregiver when they were a child: good Patient's description of current relationship with people who raised him/her: both deceased Does patient have siblings?: Yes Number of Siblings: 2 Description of patient's current relationship with siblings: sisters look in on me, worry about me Did patient suffer any verbal/emotional/physical/sexual abuse as a child?: No Did patient suffer from severe childhood neglect?: No Has patient ever been sexually abused/assaulted/raped as an adolescent or adult?: No Was the patient ever a victim of a crime or a disaster?: No Witnessed domestic violence?: No Has patient been effected by domestic violence as an adult?: No  Education:  Highest grade of school patient has completed: 12 Currently a student?: No Learning disability?: No  Employment/Work Situation:   Employment situation: On disability Why is patient on disability: mental health How long has patient been on disability:  13 Patient's job has been impacted by current illness: No What is the longest time patient has a held a job?: N/A Where was the patient employed at that time?: N/A Has patient ever been in the [employer]?: No Has patient ever served in Buyer, retail?: No  Financial Resources:   Surveyor, quantity resources: Insurance claims handler Does patient have a Lawyer or guardian?: Yes  Alcohol/Substance Abuse:   Alcohol/Substance Abuse Treatment Hx: Denies past history Has alcohol/substance abuse ever caused legal problems?: No  Social Support System:   Conservation officer, nature Support System: Passenger transport manager Support System: sisters Type of faith/religion: LDS How does patient's faith help to cope with current illness?: No  Leisure/Recreation:   Leisure and Hobbies: bowling  Strengths/Needs:   What things does the patient do well?: play cards In what areas does patient struggle / problems for patient: N/A  Discharge Plan:   Does patient have access to transportation?: Yes Will patient be returning to same living situation after discharge?: Yes Currently receiving community mental health services: Yes (From Whom) Arna Medici) Does patient have financial barriers related to discharge medications?: No  Summary/Recommendations:   Summary and Recommendations (to be completed by the evaluator): Shaquil is a 57 YO Caucasian male diagnosed with schizophrenia who has been with [place] several times in the past. and  has limited insight.  According to his sister, he has become more symptomatic recentely, and based on past experience wants to get him help before he becomes worse.  He can benefit from crises stabilization, referral for services, medication managment and therapeutic milieu.  [name] B. 09/22/2012

## 2012-09-22 NOTE — [facility] Group Notes (Signed)
[facility] LCSW Group Therapy  09/22/2012  1:05 PM  Type of Therapy:  Group therapy  Participation Level:  Active  Participation Quality:  Attentive  Affect:  Flat  Cognitive:  Oriented  Insight:  Limited  Engagement in Therapy:  Limited  Modes of Intervention:  Discussion, Socialization  Summary of Progress/Problems:  Chaplain was here to lead a group on themes of hope and courage.  [name] stated that hope and faith are the same for him.  They are both not seen.  He worked Holiday representative on [employer], and when finished, excused himself to go to the restroom and did not return.  [name] B 09/22/2012 1:40 PM

## 2012-09-22 NOTE — Progress Notes (Signed)
Patient ID: [name], male   DOB: [birth year], 56 y.o.   MRN: [id number] D. Patient presents with flat affect mood depressed. Patient states ''Oh I'm ok'' Patient states ''my sister brought me in because I'm not living right '' insight into illness remains poor. Patient denies any auditory or visual hallucinations however noted throughout shift patient mumbling to self at times. Patient responses with writer have been appropriate and denies any acute concerns at this time. Pt has been visible on the unit attending unit programming. A. Allowed patient to ventilate support and encouragement provided. R. Pt currently calm and cooperative. Will continue to monitor q 15 minutes for safety.
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
## 2012-09-22 NOTE — [facility] Suicide Risk Assessment (Signed)
Suicide Risk Assessment  Admission Assessment     Nursing information obtained from:  Patient Demographic factors:  Unemployed Current Mental Status:  NA Loss Factors:  NA Historical Factors:  NA Risk Reduction Factors:  Living with another person, especially a relative;Positive social support  CLINICAL FACTORS:   Schizophrenia:   Paranoid or undifferentiated type Currently Psychotic Previous Psychiatric Diagnoses and Treatments  COGNITIVE FEATURES THAT CONTRIBUTE TO RISK:  Closed-mindedness Polarized thinking Thought constriction (tunnel vision)    SUICIDE RISK:   Minimal: No identifiable suicidal ideation.  Patients presenting with no risk factors but with morbid ruminations; may be classified as minimal risk based on the severity of the depressive symptoms  PLAN OF CARE:1. Admit for crisis management and stabilization. 2. Medication management to reduce current symptoms to base line and improve the     patient's overall level of functioning 3. Treat health problems as indicated. 4. Develop treatment plan to decrease risk of relapse upon discharge and the need for     readmission. 5. Psycho-social education regarding relapse prevention and self care. 6. Health care follow up as needed for medical problems. 7. Restart home medications where appropriate.   I certify that inpatient services furnished can reasonably be expected to improve the patient's condition.  Thedore Mins, MD 09/22/2012, 11:15 AM

## 2012-09-23 MED ORDER — HYDROXYZINE HCL 50 MG PO TABS
50.0000 mg | ORAL_TABLET | Freq: Every evening | ORAL | Status: DC | PRN
Start: 2012-09-23 — End: 2012-09-28
  Filled 2012-09-23: qty 2

## 2012-09-23 MED ORDER — LORAZEPAM 1 MG PO TABS: 1.0000 mg | ORAL_TABLET | Freq: Every evening | ORAL | Status: DC | PRN

## 2012-09-23 NOTE — Progress Notes (Signed)
Patient ID: [name], male   DOB: [birth year], 58 y.o.   MRN: [id number] D. Patient presents with flat affect affect, apathetic mood. Patient with minimal interaction with peers and staff. Patient states '' I'm fine ''  He denies any acute concerns , only given blunted short responses '' I'm good. Don't need anything '' . A. Medications given as ordered. Patient denies any AH/VH or SI/HI. Patient declined to complete self inventory sheet despite encouragement.  Report given from prior shift that patient with poor sleep at hs, and noted to pace, patient did endorse poor sleep as well last night. Writer discussed with Vernona Rieger NP pt lack of sleep. R. No further voiced concerns at this time. Patient has been isolative to room, has not attended am unit programming but cooperative and calm.  Will continue to monitor q 15 minutes for safety and redirect as needed.
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
## 2012-09-23 NOTE — [facility] Group Notes (Signed)
[facility] Group Notes: (Clinical Social Work)   09/23/2012      Type of Therapy:  Group Therapy   Participation Level:  Did Not Attend    Ambrose Mantle, LCSW 09/23/2012, 12:17 PM

## 2012-09-23 NOTE — [facility] Group Notes (Signed)
[facility] Group Notes:  (Nursing/MHT/Case Management/Adjunct)  Date:  09/23/2012  Time:  10:25 AM  Type of Therapy:  Psychoeducational Skills  Participation Level:  Did Not Attend  Participation Quality:  did not attend  Affect:  did not attend  Cognitive:  n/a  Insight:  None  Engagement in Group:  did not attend  Modes of Intervention:  na  Summary of Progress/Problems:  [name] 09/23/2012, 10:25 AM

## 2012-09-24 NOTE — Progress Notes (Signed)
Spoke with pt 1:1 who denies any concerns, needs. Initially denies AVH but with prompting admits to hearing God's voice. States God reads scripture to him throughout the day. Pt also continues with delusional thinking and believes he is a robot who is God-like. Medicated per orders. Supported, encouraged. Pt isolative, guarded, keeping to self this evening. Currently resting in bed. Denies SI/HI/VH and remains safe.[name]

## 2012-09-24 NOTE — [facility] Group Notes (Signed)
[facility] Group Notes:  (Nursing/MHT/Case Management/Adjunct)  Date:  09/24/2012  Time:  11:01 AM  Type of Therapy:  Psychoeducational Skills  Participation Level:  None  Participation Quality:  Inattentive  Affect:  Flat  Cognitive:  Confused  Insight:  None  Engagement in Group:  None  Modes of Intervention:  Discussion, Education and Exploration  Summary of Progress/Problems: pschoeducational review with healthy support systems and review of self inventory pt did not engage in group with prompts. Malva Limes 09/24/2012, 11:01 AM

## 2012-09-24 NOTE — Progress Notes (Signed)
Patient ID: [name], male   DOB: 03/21/1955, 57 y.o.   MRN: [id number] D. Patient presents with flat affect, apathetic mood. Patient with minimal interaction with peers and staff,. Patient denies any acute concerns. He denies depression, or any A/V Hallucinations denies SI/HI. Patient remains with fixed delusions but does not discuss this without prompts. In no acute distress, no signs of acute decompensation. A. Medications given as ordered. Patient has attended unit programming, and noted to be more engaged during spirituality group, however continues to be isolative with minimal interaction with peers throughout rest of shift. R. No further voiced concerns at this time. Patient has been calm and coopeartive. Will continue to monitor q 15 minutes for safety and redirect as needed.

## 2012-09-24 NOTE — [facility] Group Notes (Signed)
[facility] Group Notes:  (Clinical Social Work)  09/24/2012   11:15am-12:00pm  Summary of Progress/Problems:  The main focus of today's process group was to listen to a variety of genres of music and to identify that different types of music provoke different responses.  The patient then was able to identify personally what was soothing for them, as well as energizing.  Handouts were used to record feelings evoked, as well as how patient can personally use this knowledge in sleep habits, with depression, and with other symptoms.  The patient expressed understanding of concepts, as well as knowledge of how each type of music affected them and how this can be used when they are at home as a tool in their recovery.  He had a flat affect throughout but was very engaged in the group, responding promptly with comments about his feelings evoked by the music genres.  Type of Therapy:  Music Therapy   Participation Level:  Active  Participation Quality:  Attentive and Sharing  Affect:  Blunted  Cognitive:  Oriented  Insight:  Engaged  Engagement in Therapy:  Engaged  Modes of Intervention:   Activity, Exploration  Ambrose Mantle, LCSW 09/24/2012, 12:28 PM

## 2012-09-24 NOTE — Progress Notes (Signed)
Clarke's status remains fairly unchanged. He appears at baseline. Initially denies AH but when specifically asked about hearing God's voice he confirms he does. When asked if he hears God all the time he responds "yes, all day, everyday" and implies this is a comfort to him. No VH. Medicated per orders. Support, encouragement given. He did not attend group this evening. Denies SI/HI and remains safe. Lawrence Marseilles

## 2012-09-24 NOTE — Progress Notes (Signed)
[facility] MD Progress Note  09/24/2012 1:32 PM [name]  MRN:  [id number]  Subjective:  Patient denies symptoms of depression and suicidal ideations, and has no hallucinations.  Sleep and appetite are "good", no physical discomforts voiced.  He was alert and oriented, appears to be at his baseline.  Mr. [name] stated he has been here at [name] before and needs to talk to Rod, Clifford social worker, tomorrow to see when he will be discharged.  He lives with his sister who is his guardian and support system along with his ACT team.  Diagnosis:   Axis I: Chronic Paranoid Schizophrenia Axis II: Deferred Axis III:  Past Medical History  Diagnosis Date  . Schizophrenia    Axis IV: other psychosocial or environmental problems, problems related to social environment and problems with primary support group Axis V: 41-50 serious symptoms  ADL's:  Intact  Sleep: Good  Appetite:  Good  Suicidal Ideation:  Denies Homicidal Ideation:  Denies  Psychiatric Specialty Exam: Review of Systems  Constitutional: Negative.   HENT: Negative.   Eyes: Negative.   Respiratory: Negative.   Cardiovascular: Negative.   Gastrointestinal: Negative.   Genitourinary: Negative.   Musculoskeletal: Negative.   Skin: Negative.   Neurological: Negative.   Endo/Heme/Allergies: Negative.   Psychiatric/Behavioral: Positive for hallucinations.    Blood pressure 126/69, pulse 83, temperature 97.3 F (36.3 C), temperature source Oral, resp. rate 24, height 5\' 8"  (1.727 m), weight 78.472 kg (173 lb).Body mass index is 26.31 kg/(m^2).  General Appearance: Casual  Eye Contact::  Fair  Speech:  Slow  Volume:  Decreased  Mood:  Depressed  Affect:  Flat  Thought Process:  Coherent  Orientation:  Full (Time, Place, and Person)  Thought Content:  WDL  Suicidal Thoughts:  No  Homicidal Thoughts:  No  Memory:  Immediate;   Fair Recent;   Fair Remote;   Fair  Judgement:  Fair  Insight:  Fair  Psychomotor Activity:   Decreased  Concentration:  Fair  Recall:  Fair  Akathisia:  No  Handed:  Right  AIMS (if indicated):     Assets:  Communication Skills Resilience Social Support  Sleep:  Number of Hours: 2.25   Current Medications: Current Facility-Administered Medications  Medication Dose Route Frequency Provider Last Rate Last Dose  . acetaminophen (TYLENOL) tablet 650 mg  650 mg Oral Q6H PRN Nelly Rout, MD      . alum & mag hydroxide-simeth (MAALOX/MYLANTA) 200-200-20 MG/5ML suspension 30 mL  30 mL Oral Q4H PRN Nelly Rout, MD      . benztropine (COGENTIN) tablet 2 mg  2 mg Oral QHS Mojeed Akintayo   2 mg at 09/23/12 2101  . clonazePAM (KLONOPIN) tablet 1 mg  1 mg Oral QHS Nelly Rout, MD   1 mg at 09/23/12 2101  . fluPHENAZine (PROLIXIN) tablet 30 mg  30 mg Oral QHS Nelly Rout, MD   30 mg at 09/23/12 2101  . hydrOXYzine (ATARAX/VISTARIL) tablet 50 mg  50 mg Oral QHS PRN Fransisca Kaufmann, NP      . levothyroxine (SYNTHROID, LEVOTHROID) tablet 75 mcg  75 mcg Oral QAC breakfast Nelly Rout, MD   75 mcg at 09/24/12 [phone]  . LORazepam (ATIVAN) tablet 1 mg  1 mg Oral QHS PRN Fransisca Kaufmann, NP      . magnesium hydroxide (MILK OF MAGNESIA) suspension 30 mL  30 mL Oral Daily PRN Nelly Rout, MD      . nicotine (NICODERM CQ - dosed in  mg/24 hours) patch 21 mg  21 mg Transdermal Q0600 Nelly Rout, MD   21 mg at 09/24/12 0813  . pantoprazole (PROTONIX) EC tablet 40 mg  40 mg Oral Daily Nelly Rout, MD   40 mg at 09/24/12 [id number]  . traZODone (DESYREL) tablet 200 mg  200 mg Oral QHS Nelly Rout, MD   200 mg at 09/23/12 2101  . ziprasidone (GEODON) injection 20 mg  20 mg Intramuscular Q12H PRN Nelly Rout, MD        Lab Results: No results found for this or any previous visit (from Clifford past 48 hour(s)).  Physical Findings: AIMS:  , ,  ,  ,    CIWA:    COWS:     Treatment Plan Summary: Daily contact with patient to assess and evaluate symptoms and progress in treatment Medication  management  Plan:  Review of chart, vital signs, medications, and notes. 1-Individual and group therapy 2-Medication management for psychosis related to his schizophrenia:  Medications reviewed with Clifford patient and no untoward effects voiced, no changes made 3-Coping skills for chronic mental illness 4-Continue crisis stabilization and management 5-Address health issues--monitoring vital signs, stable 6-Treatment plan in progress to prevent relapse of psychosis  Medical Decision Making Problem Points:  Established problem, stable/improving (1) and Review of psycho-social stressors (1) Data Points:  Review of medication regiment & side effects (2)  I certify that inpatient services furnished can reasonably be expected to improve Clifford patient's condition.   Nanine Means, PMH-NP 09/24/2012, 1:32 PM  Reviewed Clifford information documented and agree with Clifford treatment plan.  Achille Xiang,JANARDHAHA R. 09/25/2012 12:22 PM

## 2012-09-25 NOTE — Progress Notes (Signed)
Patient ID: [name], male   DOB: Oct 31, 1955, 57 y.o.   MRN: [id number] Patient remains slightly confused and has minimal interaction with staff and others.  He is mostly isolative to his room.  His sister came to visit today and they had a good visit.  He denies any SI/HI/AVH.  Continue to redirect patient as necessary; his behavior has been appropriate on the milieu.  Continue to monitor medication management and MD orders.  Safety checks completed every 15 minutes per protocol.

## 2012-09-25 NOTE — Progress Notes (Signed)
Recreation Therapy Notes  Date: 08.04.2014 Time: 9:30am Location: [address] Day Room  Group Topic: Leisure Education  Goal Area(s) Addresses:  Patient will verbalize activity of interest by end of group session. Patient will verbalize the ability to use positive leisure/recreation as a coping mechanism.  Behavioral Response: Engaged, Fixated, Attentive  Intervention: Game  Activity: Letters of Leisure. Patients were asked to select a card from LRT, using the card patients were asked to identify a recreation activity to start with the letter of the alphabet on the card. Additionally patient was asked to identify an emotion they experience when participating in that activity.   Education:  Leisure Education, Pharmacologist, Discharge Planning  Education Outcome: Needs additional education  Clinical Observations/Feedback: Patient presented with flat affect and soft, slurred speech. Patient selected X from LRT, patient stated "x-ray." LRT attempted to investigate if patient thinks taking an x-ray is a leisure/recreation activity, however patient was fixated on the act of taking an x-ray. Patient peer suggested exercise, which LRT stated patient could use for this activity, however patient remained fixated on "x-ray" being the correct answer. Patient did not state an emotion associated with taking x-rays.   Marykay Lex Dahl Higinbotham, LRT/CTRS  Jearl Klinefelter 09/25/2012 12:59 PM

## 2012-09-25 NOTE — [facility] Group Notes (Signed)
[facility] LCSW Group Therapy  09/25/2012 1:15 pm  Type of Therapy: Process Group Therapy  Participation Level:  Minimal  Participation Quality:  Appropriate  Affect:  Flat  Cognitive:  Oriented  Insight:  Improving  Engagement in Group:  Limited  Engagement in Therapy:  Limited  Modes of Intervention:  Activity, Clarification, Education, Problem-solving and Support  Summary of Progress/Problems: Today's group addressed the issue of overcoming obstacles.  Patients were asked to identify their biggest obstacle post d/c that stands in the way of their on-going success, and then problem solve as to how to manage this.  Geremy could think of no barriers.  He takes his medication regularly and goes to his follow up appointments.  The only thing he offered was that maybe he could cut back on his caffeine intake.  When he stated his sister takes care of everything for him, I pointed out that she is not able to care for him as well as she used to because of some physical limitations, and how hard it must be for him to think about having less and less support from her.  Other group members picked up on this, but Basilio did not/could not.  [name] B 09/25/2012   4:08 PM

## 2012-09-25 NOTE — Progress Notes (Signed)
Patient ID: [name], male   DOB: 1955-06-09, 57 y.o.   MRN: [id number]  D: Patient lying in bed. Appears a little preoccupied but responds appropriately. Reports that he will probably discharge sometime this week. Currently denies any SI, HI, or a/v hallucinations. Staying in room initially but did come out and watch some tv with other peers. A: Staff will monitor on q 15 minute checks, follow treatment plan, and give meds as ordered. R: Cooperative on unit at present.

## 2012-09-25 NOTE — Progress Notes (Signed)
Patient ID: [name], male   DOB: 1956/01/10, 57 y.o.   MRN: [id number] [facility] MD Progress Note  09/25/2012 10:56 AM [name]  MRN:  [id number] Subjective:   Patient will deny symptoms but when questioned about information written in the chart states "Yes I am hearing the voice of God. He tells me not to worry because I am a supreme robot."   Objective: Patient is visible on the unit today. Staff reports that patient has been acting confused and having trouble finding his room. When patient was asked basic mental status exam questions by writer he was able to answer all of them correctly and appeared oriented.   Diagnosis:   Axis I: Chronic Paranoid Schizophrenia Axis II: Deferred Axis III:  Past Medical History  Diagnosis Date  . Schizophrenia    Axis IV: other psychosocial or environmental problems, problems related to social environment and problems with primary support group Axis V: 41-50 serious symptoms  ADL's:  Intact  Sleep: Good  Appetite:  Good  Suicidal Ideation:  Denies Homicidal Ideation:  Denies  Psychiatric Specialty Exam: Review of Systems  Constitutional: Negative.   HENT: Negative.   Eyes: Negative.   Respiratory: Negative.   Cardiovascular: Negative.   Gastrointestinal: Negative.   Genitourinary: Negative.   Musculoskeletal: Negative.   Skin: Negative.   Neurological: Negative.   Endo/Heme/Allergies: Negative.   Psychiatric/Behavioral: Positive for hallucinations.    Blood pressure 92/62, pulse 104, temperature 98.1 F (36.7 C), temperature source Oral, resp. rate 20, height 5\' 8"  (1.727 m), weight 78.472 kg (173 lb).Body mass index is 26.31 kg/(m^2).  General Appearance: Casual  Eye Contact::  Fair  Speech:  Slow  Volume:  Decreased  Mood:  Depressed  Affect:  Flat  Thought Process:  Coherent  Orientation:  Full (Time, Place, and Person)  Thought Content:  WDL  Suicidal Thoughts:  No  Homicidal Thoughts:  No  Memory:  Immediate;    Fair Recent;   Fair Remote;   Fair  Judgement:  Fair  Insight:  Fair  Psychomotor Activity:  Decreased  Concentration:  Fair  Recall:  Fair  Akathisia:  No  Handed:  Right  AIMS (if indicated):     Assets:  Communication Skills Resilience Social Support  Sleep:  Number of Hours: 1.25   Current Medications: Current Facility-Administered Medications  Medication Dose Route Frequency Provider Last Rate Last Dose  . acetaminophen (TYLENOL) tablet 650 mg  650 mg Oral Q6H PRN Nelly Rout, MD      . alum & mag hydroxide-simeth (MAALOX/MYLANTA) 200-200-20 MG/5ML suspension 30 mL  30 mL Oral Q4H PRN Nelly Rout, MD      . benztropine (COGENTIN) tablet 2 mg  2 mg Oral QHS Mojeed Akintayo   2 mg at 09/24/12 2112  . clonazePAM (KLONOPIN) tablet 1 mg  1 mg Oral QHS Nelly Rout, MD   1 mg at 09/24/12 2112  . fluPHENAZine (PROLIXIN) tablet 30 mg  30 mg Oral QHS Nelly Rout, MD   30 mg at 09/24/12 2112  . hydrOXYzine (ATARAX/VISTARIL) tablet 50 mg  50 mg Oral QHS PRN Fransisca Kaufmann, NP      . levothyroxine (SYNTHROID, LEVOTHROID) tablet 75 mcg  75 mcg Oral QAC breakfast Nelly Rout, MD   75 mcg at 09/25/12 [phone]  . LORazepam (ATIVAN) tablet 1 mg  1 mg Oral QHS PRN Fransisca Kaufmann, NP      . magnesium hydroxide (MILK OF MAGNESIA) suspension 30 mL  30 mL  Oral Daily PRN Nelly Rout, MD      . nicotine (NICODERM CQ - dosed in mg/24 hours) patch 21 mg  21 mg Transdermal Q0600 Nelly Rout, MD   21 mg at 09/25/12 0803  . pantoprazole (PROTONIX) EC tablet 40 mg  40 mg Oral Daily Nelly Rout, MD   40 mg at 09/25/12 0800  . traZODone (DESYREL) tablet 200 mg  200 mg Oral QHS Nelly Rout, MD   200 mg at 09/24/12 2112  . ziprasidone (GEODON) injection 20 mg  20 mg Intramuscular Q12H PRN Nelly Rout, MD        Lab Results: No results found for this or any previous visit (from the past 48 hour(s)).  Physical Findings: AIMS:  , ,  ,  ,    CIWA:    COWS:     Treatment Plan Summary: Daily contact  with patient to assess and evaluate symptoms and progress in treatment Medication management  Plan:  Review of chart, vital signs, medications, and notes. 1-Individual and group therapy 2-Medication management for psychosis related to his schizophrenia:  Medications reviewed with the patient and no untoward effects voiced, no changes made 3-Coping skills for chronic mental illness. 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  4-Continue crisis stabilization and management 5-Address health issues--monitoring vital signs, stable 6-Treatment plan in progress to prevent relapse of psychosis  Medical Decision Making Problem Points:  Established problem, stable/improving (1) and Review of psycho-social stressors (1) Data Points:  Review of medication regiment & side effects (2)  I certify that inpatient services furnished can reasonably be expected to improve the patient's condition.   Fransisca Kaufmann, NP-C 09/25/2012, 10:56 AM

## 2012-09-25 NOTE — [facility] Group Notes (Signed)
[facility] LCSW Aftercare Discharge Planning Group Note   09/25/2012 8:24 AM  Participation Quality:  Minimal  Mood/Affect:  Flat  Depression Rating:  denies  Anxiety Rating:  denies  Thoughts of Suicide:  No Will you contract for safety?   NA  Current AVH:  No  Plan for Discharge/Comments:  [name] states he is great and ready to go home.  I noticed we had put up a yellow sign with his name outside his door, and asked him why that was there.  He had no idea, but others were quick to point out that he had been wandering into others' rooms.  Apparently has some confusion going on.  Transportation Means: sister   Supports: sister  Ida Rogue

## 2012-09-25 NOTE — Care Management Utilization Note (Signed)
   Per State Regulation 482.30  This chart was reviewed for necessity with respect to the patient's Admission/ Duration of stay.  Next review date: 09/28/12  Taejon Irani Morrison RN, BSN 

## 2012-09-25 NOTE — Progress Notes (Signed)
Adult Psychoeducational Group Note  Date:  09/25/2012 Time:  11:00am  Group Topic/Focus:  Goals Group:   The focus of this group is to help patients establish daily goals to achieve during treatment and discuss how the patient can incorporate goal setting into their daily lives to aide in recovery.  Participation Level:  Did Not Attend  Participation Quality:    Affect:    Cognitive:    Insight:   Engagement in Group:    Modes of Intervention:   Additional Comments:  Pt did not attend.  Shelly Bombard D 09/25/2012, 11:44 AM

## 2012-09-26 NOTE — [facility] Group Notes (Signed)
Adult Psychoeducational Group Note  Date:  09/26/2012 Time:  9:02 PM  Group Topic/Focus:  Wrap-Up Group:   The focus of this group is to help patients review their daily goal of treatment and discuss progress on daily workbooks.  Participation Level:  Did Not Attend  Participation Quality:  None  Affect:  None  Cognitive:  None  Insight: None  Engagement in Group:  None  Modes of Intervention:  Discussion  Additional Comments:  Advay did not attend group.  [name] A 09/26/2012, 9:02 PM

## 2012-09-26 NOTE — [facility] Group Notes (Signed)
[facility] LCSW Group Therapy  09/26/2012 , 12:30 PM   Type of Therapy:  Group Therapy  Participation Level:  Pt attended, but declined to participate.  Sat silently for entire group, and nodded off several times.      Summary of Progress/Problems: Today's group focused on the term Diagnosis.  Participants were asked to define the term, and then pronounce whether it is a negative, positive or neutral term.  [name] B 09/26/2012 , 12:30 PM

## 2012-09-26 NOTE — Progress Notes (Signed)
Patient ID: [name], male   DOB: [birth year], 57 y.o.   MRN: [id number]  [facility] MD Progress Note  09/26/2012 2:46 PM [name]  MRN:  [id number] Subjective:   Patient states "I'm still doing good. I'm just waiting to see if when I can go back and live with my sister.   Objective:  The patient continues to be at his baseline. He forwards little but will discuss his delusions when prompted which appear to be fixed.   Diagnosis:   Axis I: Chronic Paranoid Schizophrenia Axis II: Deferred Axis III:  Past Medical History  Diagnosis Date  . Schizophrenia    Axis IV: other psychosocial or environmental problems, problems related to social environment and problems with primary support group Axis V: 41-50 serious symptoms  ADL's:  Intact  Sleep: Good  Appetite:  Good  Suicidal Ideation:  Denies Homicidal Ideation:  Denies  Psychiatric Specialty Exam: Review of Systems  Constitutional: Negative.   HENT: Negative.   Eyes: Negative.   Respiratory: Negative.   Cardiovascular: Negative.   Gastrointestinal: Negative.   Genitourinary: Negative.   Musculoskeletal: Negative.   Skin: Negative.   Neurological: Negative.   Endo/Heme/Allergies: Negative.   Psychiatric/Behavioral: Positive for hallucinations.    Blood pressure 111/65, pulse 70, temperature 98 F (36.7 C), temperature source Oral, resp. rate 21, height 5\' 8"  (1.727 m), weight 78.472 kg (173 lb).Body mass index is 26.31 kg/(m^2).  General Appearance: Casual  Eye Contact::  Fair  Speech:  Slow  Volume:  Decreased  Mood:  Depressed  Affect:  Flat  Thought Process:  Coherent  Orientation:  Full (Time, Place, and Person)  Thought Content:  WDL  Suicidal Thoughts:  No  Homicidal Thoughts:  No  Memory:  Immediate;   Fair Recent;   Fair Remote;   Fair  Judgement:  Fair  Insight:  Fair  Psychomotor Activity:  Decreased  Concentration:  Fair  Recall:  Fair  Akathisia:  No  Handed:  Right  AIMS (if  indicated):     Assets:  Communication Skills Resilience Social Support  Sleep:  Number of Hours: 3   Current Medications: Current Facility-Administered Medications  Medication Dose Route Frequency Provider Last Rate Last Dose  . acetaminophen (TYLENOL) tablet 650 mg  650 mg Oral Q6H PRN Nelly Rout, MD      . alum & mag hydroxide-simeth (MAALOX/MYLANTA) 200-200-20 MG/5ML suspension 30 mL  30 mL Oral Q4H PRN Nelly Rout, MD      . benztropine (COGENTIN) tablet 2 mg  2 mg Oral QHS Mojeed Akintayo   2 mg at 09/25/12 2127  . clonazePAM (KLONOPIN) tablet 1 mg  1 mg Oral QHS Nelly Rout, MD   1 mg at 09/25/12 2127  . fluPHENAZine (PROLIXIN) tablet 30 mg  30 mg Oral QHS Nelly Rout, MD   30 mg at 09/25/12 2127  . hydrOXYzine (ATARAX/VISTARIL) tablet 50 mg  50 mg Oral QHS PRN Fransisca Kaufmann, NP      . levothyroxine (SYNTHROID, LEVOTHROID) tablet 75 mcg  75 mcg Oral QAC breakfast Nelly Rout, MD   75 mcg at 09/26/12 [phone]  . magnesium hydroxide (MILK OF MAGNESIA) suspension 30 mL  30 mL Oral Daily PRN Nelly Rout, MD      . nicotine (NICODERM CQ - dosed in mg/24 hours) patch 21 mg  21 mg Transdermal Q0600 Nelly Rout, MD   21 mg at 09/26/12 0744  . pantoprazole (PROTONIX) EC tablet 40 mg  40 mg Oral Daily  Nelly Rout, MD   40 mg at 09/26/12 0744  . traZODone (DESYREL) tablet 200 mg  200 mg Oral QHS Nelly Rout, MD   200 mg at 09/25/12 2127  . ziprasidone (GEODON) injection 20 mg  20 mg Intramuscular Q12H PRN Nelly Rout, MD        Lab Results: No results found for this or any previous visit (from the past 48 hour(s)).  Physical Findings: AIMS:  , ,  ,  ,    CIWA:    COWS:     Treatment Plan Summary: Daily contact with patient to assess and evaluate symptoms and progress in treatment Medication management  Plan:  Review of chart, vital signs, medications, and notes. 1-Individual and group therapy 2-Medication management for psychosis related to his schizophrenia:   Medications reviewed with the patient and no untoward effects voiced, no changes made 3-Coping skills for chronic mental illness. 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  4-Continue crisis stabilization and management. Case manager to contact patient's sister regarding discharge. Anticipate d/c 1-2 days.  5-Address health issues--monitoring vital signs, stable 6-Treatment plan in progress to prevent relapse of psychosis  Medical Decision Making Problem Points:  Established problem, stable/improving (1) and Review of psycho-social stressors (1) Data Points:  Review of medication regiment & side effects (2)  I certify that inpatient services furnished can reasonably be expected to improve the patient's condition.   Fransisca Kaufmann, NP-C 09/26/2012, 2:46 PM

## 2012-09-26 NOTE — Progress Notes (Signed)
Patient ID: [name], male   DOB: 05/04/1955, 57 y.o.   MRN: [id number] D: Patient remains with flat, blunted affect.  He stated he had a good visit with his sister yesterday.  Patient is probably at baseline now.  There is not much change in the past few days.  He goes to groups with minimal participation.  He denies any SI/HI.  Patient appears to have internal stimuli.  A: Continue to monitor medication management and MD orders.  Safety checks completed every 15 minutes per protocol. R:  Patient's behavior has been appropriate on the milieu.

## 2012-09-26 NOTE — Progress Notes (Signed)
Patient ID: KION HUNTSBERRY, male   DOB: 1955-04-26, 57 y.o.   MRN: [id number]  D:  Pt +ve AH, but they are not command. Pt denies SI/HI/VH. Pt is pleasant and cooperative. Pt states he had a good day "I ate everything on my plate and went to meetings".  A: Pt was offered support and encouragement. Pt was given scheduled medications. Pt was encourage to attend groups. Q 15 minute checks were done for safety.   R:Pt attends groups and interacts well with peers and staff. Pt is taking medication. Pt has no complaints at this time.Pt receptive to treatment and safety maintained on unit.

## 2012-09-27 NOTE — Progress Notes (Signed)
D:  [name] reports that he slept well and has no complaints at this time.  He denies SI/HI/AVH at this time.  He is attending groups, he does appear flat and depressed. A:  Safety checks q 15 minutes.  Emotional support provided.  Medications given as ordered. R: Safety  Maintained on unit.

## 2012-09-27 NOTE — Progress Notes (Signed)
Patient ID: [name], male   DOB: 1955/10/28, 57 y.o.   MRN: [id number] D: Patient stated he is doing well and ready for discharge tomorrow. When asked he will be different after discharge pt stated he plans to continue taking his medication as prescribed. Pt mood seem depressed with flat affect. Pt denies SI/HI/AVH and pain. Pt does not appear to be responding to internal stimuli. Pt attended evening wrap up group but was not engaged in discussion. Pt denies any needs or concerns. Cooperative with assessment. No acute distressed noted at this time.   A: Met with pt 1:1. Medications administered as prescribed. Writer encouraged pt to discuss feelings. Pt encouraged to come to staff with any question or concerns. 15 minutes checks for safety.   R: Patient remains safe. He is complaint with medications and denies any adverse reaction. Continue current POC.

## 2012-09-27 NOTE — Progress Notes (Signed)
Patient ID: [name], male   DOB: 1955-12-31, 57 y.o.   MRN: [id number]  [facility] MD Progress Note  09/27/2012 10:15 AM [name]  MRN:  [id number] Subjective:  "I am doing much better."  Objective: Patient reports that he has been sleeping better and thinks his medications are working for him. His thought process is more organized. However, he continues to have a fixed delusions about being a supreme  Robots that can communicate with God. He denies suicidal/homicidal ideation, intent and plan.  Diagnosis:   Axis I: Chronic Paranoid Schizophrenia Axis II: Deferred Axis III:  Past Medical History  Diagnosis Date   Axis IV: other psychosocial or environmental problems, problems related to social environment and problems with primary support group Axis V: 50-60 moderate symptoms  ADL's:  Intact  Sleep: Good  Appetite:  Good  Suicidal Ideation:  Denies Homicidal Ideation:  Denies  Psychiatric Specialty Exam: Review of Systems  Constitutional: Negative.   HENT: Negative.   Eyes: Negative.   Respiratory: Negative.   Cardiovascular: Negative.   Gastrointestinal: Negative.   Genitourinary: Negative.   Musculoskeletal: Negative.   Skin: Negative.   Neurological: Negative.   Endo/Heme/Allergies: Negative.     Blood pressure 111/65, pulse 70, temperature 98 F (36.7 C), temperature source Oral, resp. rate 21, height 5\' 8"  (1.727 m), weight 78.472 kg (173 lb).Body mass index is 26.31 kg/(m^2).  General Appearance: Casual  Eye Contact::  Fair  Speech:  Slow  Volume:  Decreased  Mood:  Depressed  Affect:  Flat  Thought Process:  Coherent  Orientation:  Full (Time, Place, and Person)  Thought Content:  WDL  Suicidal Thoughts:  No  Homicidal Thoughts:  No  Memory:  Immediate;   Fair Recent;   Fair Remote;   Fair  Judgement:  Fair  Insight:  Fair  Psychomotor Activity:  Decreased  Concentration:  Fair  Recall:  Fair  Akathisia:  No  Handed:  Right  AIMS (if  indicated):     Assets:  Communication Skills Resilience Social Support  Sleep:  Number of Hours: 5   Current Medications: Current Facility-Administered Medications  Medication Dose Route Frequency Provider Last Rate Last Dose  . acetaminophen (TYLENOL) tablet 650 mg  650 mg Oral Q6H PRN Nelly Rout, MD      . alum & mag hydroxide-simeth (MAALOX/MYLANTA) 200-200-20 MG/5ML suspension 30 mL  30 mL Oral Q4H PRN Nelly Rout, MD      . benztropine (COGENTIN) tablet 2 mg  2 mg Oral QHS Kynleigh Artz   2 mg at 09/26/12 2107  . clonazePAM (KLONOPIN) tablet 1 mg  1 mg Oral QHS Nelly Rout, MD   1 mg at 09/26/12 2107  . fluPHENAZine (PROLIXIN) tablet 30 mg  30 mg Oral QHS Nelly Rout, MD   30 mg at 09/26/12 2107  . hydrOXYzine (ATARAX/VISTARIL) tablet 50 mg  50 mg Oral QHS PRN Fransisca Kaufmann, NP      . levothyroxine (SYNTHROID, LEVOTHROID) tablet 75 mcg  75 mcg Oral QAC breakfast Nelly Rout, MD   75 mcg at 09/27/12 0701  . magnesium hydroxide (MILK OF MAGNESIA) suspension 30 mL  30 mL Oral Daily PRN Nelly Rout, MD      . nicotine (NICODERM CQ - dosed in mg/24 hours) patch 21 mg  21 mg Transdermal Q0600 Nelly Rout, MD   21 mg at 09/27/12 0924  . pantoprazole (PROTONIX) EC tablet 40 mg  40 mg Oral Daily Nelly Rout, MD  40 mg at 09/27/12 0756  . traZODone (DESYREL) tablet 200 mg  200 mg Oral QHS Nelly Rout, MD   200 mg at 09/26/12 2107  . ziprasidone (GEODON) injection 20 mg  20 mg Intramuscular Q12H PRN Nelly Rout, MD        Lab Results: No results found for this or any previous visit (from the past 48 hour(s)).  Physical Findings: AIMS:  , ,  ,  ,    CIWA:    COWS:     Treatment Plan Summary: Daily contact with patient to assess and evaluate symptoms and progress in treatment Medication management  Plan:  Review of chart, vital signs, medications, and notes. 1-Individual and group therapy 2-Medication management for psychosis related to his schizophrenia:   Medications reviewed with the patient and no untoward effects voiced, no changes made 3-Coping skills for chronic mental illness. 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  4-Continue crisis stabilization and management. Case manager to contact patient's sister regarding discharge.  5-Address health issues--monitoring vital signs, stable 6-Possible discharge on 09/28/12  Medical Decision Making Problem Points:  Established problem, stable/improving (1) and Review of psycho-social stressors (1) Data Points:  Review of medication regiment & side effects (2)  I certify that inpatient services furnished can reasonably be expected to improve the patient's condition.   Quavion Boule,MD 09/27/2012, 10:15 AM

## 2012-09-27 NOTE — [facility] Group Notes (Signed)
[facility] LCSW Aftercare Discharge Planning Group Note   09/27/2012 10:32 AM  Participation Quality:  Minimal  Mood/Affect:  Flat  Depression Rating:  denies  Anxiety Rating:  denies  Thoughts of Suicide:  No Will you contract for safety?   NA  Current AVH:  No  Plan for Discharge/Comments:  Denies all symptoms.  Happy that he will be returning home tomorrow.  Transportation Means: sister  Supports: sister  Ida Rogue

## 2012-09-27 NOTE — Progress Notes (Signed)
Recreation Therapy Notes  Date: 08.06.2014 Time: 9:30am Location: [address] Dayroom  Group Topic: Leisure Education  Goal Area(s) Addresses:  Patient will verbalize activity of interest by end of group session. Patient will verbalize the ability to use positive leisure/recreation as a coping mechanism.  Behavioral Response: Did not attend.  Marykay Lex Lorn Butcher, LRT/CTRS  Athalia Setterlund L 09/27/2012 1:06 PM

## 2012-09-27 NOTE — [facility] Group Notes (Signed)
Adult Psychoeducational Group Note  Date:  09/27/2012 Time:  10:08 PM  Group Topic/Focus:  Wrap-Up Group:   The focus of this group is to help patients review their daily goal of treatment and discuss progress on daily workbooks.  Participation Level:  Minimal  Participation Quality:  Appropriate  Affect:  Flat  Cognitive:  Lacking  Insight: Limited  Engagement in Group:  Limited  Modes of Intervention:  Discussion  Additional Comments:  [name] stated he had a good day and that he goes home tomorrow.  He said he stated on the unit, watched television and drank coffee.  [name] A 09/27/2012, 10:08 PM

## 2012-09-27 NOTE — [facility] Suicide Risk Assessment (Signed)
[facility] INPATIENT:  Family/Significant Other Suicide Prevention Education  Suicide Prevention Education:  Education Completed; No one has been identified by the patient as the family member/significant other with whom the patient will be residing, and identified as the person(s) who will aid the patient in the event of a mental health crisis (suicidal ideations/suicide attempt).  With written consent from the patient, the family member/significant other has been provided the following suicide prevention education, prior to the and/or following the discharge of the patient.  The suicide prevention education provided includes the following:  Suicide risk factors  Suicide prevention and interventions  National Suicide Hotline telephone number  [facility] assessment telephone number  [facility] Emergency Assistance 911  [facility] and/or Residential Mobile Crisis Unit telephone number  Request made of family/significant other to:  Remove weapons (e.g., guns, rifles, knives), all items previously/currently identified as safety concern.    Remove drugs/medications (over-the-counter, prescriptions, illicit drugs), all items previously/currently identified as a safety concern.  The family member/significant other verbalizes understanding of the suicide prevention education information provided.  The family member/significant other agrees to remove the items of safety concern listed above.  The patient did not endorse SI at the time of admission, nor did the patient c/o SI during the stay here.  SPE not required.   [name] B 09/27/2012, 12:35 PM

## 2012-09-27 NOTE — [facility] Suicide Risk Assessment (Signed)
Suicide Risk Assessment  Discharge Assessment     Demographic Factors:  Male, Low socioeconomic status and Unemployed  Mental Status Per Nursing Assessment::   On Admission:  NA  Current Mental Status by Physician: patient denies suicidal ideaton, intent or plan  Loss Factors: Financial problems/change in socioeconomic status  Historical Factors: Impulsivity  Risk Reduction Factors:   Sense of responsibility to family, Living with another person, especially a relative and Positive social support  Continued Clinical Symptoms:  Schizophrenia:   Paranoid or undifferentiated type  Cognitive Features That Contribute To Risk:  Closed-mindedness Polarized thinking    Suicide Risk:  Minimal: No identifiable suicidal ideation.  Patients presenting with no risk factors but with morbid ruminations; may be classified as minimal risk based on the severity of the depressive symptoms  Discharge Diagnoses:   AXIS I:  Paranoid schizophrenia, chronic condition  AXIS II:  Deferred AXIS III:   Past Medical History  Diagnosis Date  . Schizophrenia    AXIS IV:  other psychosocial or environmental problems and problems related to social environment AXIS V:  61-70 mild symptoms  Plan Of Care/Follow-up recommendations:  Activity:  as tolerated Diet:  healthy Tests:  routine blood test Other:  patient to keep her after care appointment  Is patient on multiple antipsychotic therapies at discharge:  No   Has Patient had three or more failed trials of antipsychotic monotherapy by history:  No  Recommended Plan for Multiple Antipsychotic Therapies: N/A  Gianluca Chhim,MD 09/28/2012, 9:20 AM

## 2012-09-27 NOTE — [facility] Group Notes (Signed)
[facility] Group Therapy  09/27/2012 , 1:17 PM    Type of Therapy:  [facility] [employer] Presentation  Participation Level:  Active  Participation Quality:  Attentive  Affect:  Blunted  Cognitive:  Oriented  Insight:  Limited  Engagement in Therapy:  Engaged  Modes of Intervention:  Discussion, Education and Socialization  Summary of Progress/Problems:  [name] from [facility] came to present his recovery story and play the guitar.  [name] sat quietly through group, sometimes listening intently and other times nodding off.  He did not wander in and out as he sometimes does.  Not disruptive.  No interaction with leader.  [name] B 09/27/2012 , 1:17 PM

## 2012-09-28 MED ORDER — CLONAZEPAM 1 MG PO TABS: 1.0000 mg | ORAL_TABLET | Freq: Every day | ORAL | Status: DC

## 2012-09-28 MED ORDER — LEVOTHYROXINE SODIUM 75 MCG PO TABS: 75.0000 ug | ORAL_TABLET | Freq: Every day | ORAL | Status: AC

## 2012-09-28 MED ORDER — TRAZODONE HCL 100 MG PO TABS: 200.0000 mg | ORAL_TABLET | Freq: Every day | ORAL | Status: DC

## 2012-09-28 MED ORDER — OMEPRAZOLE 20 MG PO CPDR: 20.0000 mg | DELAYED_RELEASE_CAPSULE | Freq: Every day | ORAL | Status: AC

## 2012-09-28 MED ORDER — BENZTROPINE MESYLATE 2 MG PO TABS: 2.0000 mg | ORAL_TABLET | Freq: Every day | ORAL | Status: AC

## 2012-09-28 MED ORDER — FLUPHENAZINE HCL 10 MG PO TABS: 30.0000 mg | ORAL_TABLET | Freq: Every day | ORAL | Status: DC

## 2012-09-28 MED ORDER — HYDROXYZINE HCL 50 MG PO TABS: 50.0000 mg | ORAL_TABLET | Freq: Every evening | ORAL | Status: DC | PRN

## 2012-09-28 NOTE — Progress Notes (Signed)
[facility] Adult Case Management Discharge Plan :  Will you be returning to the same living situation after discharge: Yes,  home At discharge, do you have transportation home?:Yes,  sister Do you have the ability to pay for your medications:Yes,  mental health  Release of information consent forms completed and in the chart;  Patient's signature needed at discharge.  Patient to Follow up at: Follow-up Information   Follow up with [facility] On 10/03/2012. (Go to your hospital follow up appointment between 8 and 9 on Tuesday)    Contact information:   [address]  [place]  [phone]      Patient denies SI/HI:   Yes,  yes    Safety Planning and Suicide Prevention discussed:  Yes,  yes  [name] B 09/28/2012, 10:06 AM

## 2012-09-28 NOTE — Discharge Summary (Signed)
Physician Discharge Summary Note  Patient:  [name] is an 57 y.o., male MRN:  [id number] DOB:  [birth year] Patient phone:  [phone] (home)  Patient address:   [address] [place] [address]   Date of Admission:  09/21/2012 Date of Discharge: 09/28/12  Discharge Diagnoses: Principal Problem:   Paranoid schizophrenia, chronic condition  Axis Diagnosis:  AXIS I: Paranoid schizophrenia, chronic condition  AXIS II: Deferred  AXIS III:  Past Medical History   Diagnosis  Date   .  Schizophrenia     AXIS IV: other psychosocial or environmental problems and problems related to social environment  AXIS V: 61-70 mild symptoms   Level of Care:  OP  Hospital Course:   Patient is 57 year old man, single, unemployed, disabled with long history of Paranoid Schizophrenia who was brought to the hospital by his sister who has observed erratic behavior, talking to self, and pacing in circles. Patient presents with delusions and psychosis. On admission he told the ED physician that he is a robot. He feels that he is a robot because, "I broke my shoulder one time". He claim to be hearing voices that he identifies as "God's voice" and heard God talking to him this morning telling him to read the scripture and follow him. He reports that he sees God occasionally but not recently. Pt is alert, oriented to self only, believes that he is still in a group home.   While a patient in this hospital, [name] was enrolled in group counseling and activities as well as received the following medication Current facility-administered medications:acetaminophen (TYLENOL) tablet 650 mg, 650 mg, Oral, Q6H PRN, Nelly Rout, MD;  alum & mag hydroxide-simeth (MAALOX/MYLANTA) 200-200-20 MG/5ML suspension 30 mL, 30 mL, Oral, Q4H PRN, Nelly Rout, MD;  benztropine (COGENTIN) tablet 2 mg, 2 mg, Oral, QHS, Mojeed Akintayo, 2 mg at 09/27/12 2116;  clonazePAM (KLONOPIN) tablet 1 mg, 1 mg, Oral, QHS, Nelly Rout,  MD, 1 mg at 09/27/12 2116 fluPHENAZine (PROLIXIN) tablet 30 mg, 30 mg, Oral, QHS, Nelly Rout, MD, 30 mg at 09/27/12 2116;  hydrOXYzine (ATARAX/VISTARIL) tablet 50 mg, 50 mg, Oral, QHS PRN, Fransisca Kaufmann, NP;  levothyroxine (SYNTHROID, LEVOTHROID) tablet 75 mcg, 75 mcg, Oral, QAC breakfast, Nelly Rout, MD, 75 mcg at 09/28/12 2130;  magnesium hydroxide (MILK OF MAGNESIA) suspension 30 mL, 30 mL, Oral, Daily PRN, Nelly Rout, MD nicotine (NICODERM CQ - dosed in mg/24 hours) patch 21 mg, 21 mg, Transdermal, Q0600, Nelly Rout, MD, 21 mg at 09/28/12 0618;  pantoprazole (PROTONIX) EC tablet 40 mg, 40 mg, Oral, Daily, Nelly Rout, MD, 40 mg at 09/28/12 8657;  traZODone (DESYREL) tablet 200 mg, 200 mg, Oral, QHS, Nelly Rout, MD, 200 mg at 09/27/12 2116;  ziprasidone (GEODON) injection 20 mg, 20 mg, Intramuscular, Q12H PRN, Nelly Rout, MD The patient's prior to admission medications were continued to include Prolixin 30 mg at hs for psychosis. The patient exhibited no behavior problems during his admission. He attended groups but remained to himself. The patient when prompted would talk about being "a supreme robot" which appears to be a fixed delusion that has been noted during prior admissions to this facility. [name] was medication compliant during his stay and was deemed to be at his baseline at discharge. The case worker contacted the patient's sister who the patient will be returning to live with. Patient attended treatment team meeting this am and met with treatment team members. Pt symptoms, treatment plan and response to  treatment discussed. [name] endorsed that their symptoms have improved. Pt also stated that they are stable for discharge.  In other to control Principal Problem:   Paranoid schizophrenia, chronic condition , they will continue psychiatric care on outpatient basis. They will follow-up at      Follow-up Information   Follow up with [facility] On 10/03/2012. (Go to your  hospital follow up appointment between 8 and 9 on Tuesday)    Contact information:   [address]  [place]  [phone]    .  In addition they were instructed to take all your medications as prescribed by your mental healthcare provider, to report any adverse effects and or reactions from your medicines to your outpatient provider promptly, patient is instructed and cautioned to not engage in alcohol and or illegal drug use while on prescription medicines, in the event of worsening symptoms, patient is instructed to call the crisis hotline, 911 and or go to the nearest ED for appropriate evaluation and treatment of symptoms.   Upon discharge, patient adamantly denies suicidal, homicidal ideations, auditory, visual hallucinations and or delusional thinking. They left [facility] with all personal belongings in no apparent distress.  Consults:  See electronic record for details  Significant Diagnostic Studies:  See electronic record for details  Discharge Vitals:   Blood pressure 104/65, pulse 69, temperature 97.5 F (36.4 C), temperature source Oral, resp. rate 18, height 5\' 8"  (1.727 m), weight 78.472 kg (173 lb)..  Mental Status Exam: See Mental Status Examination and Suicide Risk Assessment completed by Attending Physician prior to discharge.  Discharge destination:  Home  Is patient on multiple antipsychotic therapies at discharge:  No  Has Patient had three or more failed trials of antipsychotic monotherapy by history: N/A Recommended Plan for Multiple Antipsychotic Therapies: N/A    Medication List       Indication   benztropine 2 MG tablet  Commonly known as:  COGENTIN  Take 1 tablet (2 mg total) by mouth at bedtime.   Indication:  Extrapyramidal Reaction caused by Medications     clonazePAM 1 MG tablet  Commonly known as:  KLONOPIN  Take 1 tablet (1 mg total) by mouth at bedtime.   Indication:  Panic Disorder     fluPHENAZine 10 MG tablet  Commonly known as:  PROLIXIN  Take  3 tablets (30 mg total) by mouth at bedtime. For mood control.   Indication:  Psychosis     hydrOXYzine 50 MG tablet  Commonly known as:  ATARAX/VISTARIL  Take 1 tablet (50 mg total) by mouth at bedtime as needed (sleep).   Indication:  Sedation     levothyroxine 75 MCG tablet  Commonly known as:  SYNTHROID, LEVOTHROID  Take 1 tablet (75 mcg total) by mouth daily.   Indication:  Underactive Thyroid     omeprazole 20 MG capsule  Commonly known as:  PRILOSEC  Take 1 capsule (20 mg total) by mouth daily.   Indication:  Gastroesophageal Reflux Disease with Current Symptoms     traZODone 100 MG tablet  Commonly known as:  DESYREL  Take 2 tablets (200 mg total) by mouth at bedtime. For sleep.   Indication:  Trouble Sleeping       Follow-up Information   Follow up with [facility] On 10/03/2012. (Go to your hospital follow up appointment between 8 and 9 on Tuesday)    Contact information:   [address]  [place]  [phone]  Follow-up recommendations:   Activities: Resume typical activities Diet: Resume typical diet Tests: none Other: Follow up with outpatient provider and report any side effects to out patient prescriber.  Comments:  Take all your medications as prescribed by your mental healthcare provider. Report any adverse effects and or reactions from your medicines to your outpatient provider promptly. Patient is instructed and cautioned to not engage in alcohol and or illegal drug use while on prescription medicines. In the event of worsening symptoms, patient is instructed to call the crisis hotline, 911 and or go to the nearest ED for appropriate evaluation and treatment of symptoms. Follow-up with your primary care provider for your other medical issues, concerns and or health care needs.  SignedFransisca Kaufmann NP-C 09/28/2012 12:07 PM

## 2012-09-28 NOTE — Progress Notes (Signed)
Patient ID: [name], male   DOB: 05/26/1955, 57 y.o.   MRN: [id number] Patient discharged home per MD order.  Patient received all personal belongings, prescriptions, discharge instructions and medication samples.  Patient will be returning to his sister's home and he will follow up with Arna Medici. Patient denies any SI/HI/AVH.

## 2012-10-02 NOTE — Discharge Summary (Signed)
Seen and agreed. Teondre Jarosz, MD 

## 2012-10-03 NOTE — Progress Notes (Addendum)
Patient Discharge Instructions:  After Visit Summary (AVS):   Faxed to:  10/03/12 Discharge Summary Note:   Faxed to:  10/03/12 Psychiatric Admission Assessment Note:   Faxed to:  10/03/12 Suicide Risk Assessment - Discharge Assessment:   Faxed to:  10/03/12 Faxed/Sent to the Next Level Care provider:  10/03/12 Faxed to [facility] @ [phone]  Jerelene Redden, 10/03/2012, 3:17 PM

## 2015-03-21 DIAGNOSIS — I10 Essential (primary) hypertension: Secondary | ICD-10-CM | POA: Diagnosis not present

## 2015-03-21 DIAGNOSIS — F209 Schizophrenia, unspecified: Secondary | ICD-10-CM | POA: Diagnosis not present

## 2015-03-21 DIAGNOSIS — Z6833 Body mass index (BMI) 33.0-33.9, adult: Secondary | ICD-10-CM | POA: Diagnosis not present

## 2015-03-21 DIAGNOSIS — F17200 Nicotine dependence, unspecified, uncomplicated: Secondary | ICD-10-CM | POA: Diagnosis not present

## 2015-03-21 DIAGNOSIS — Z720 Tobacco use: Secondary | ICD-10-CM | POA: Diagnosis not present

## 2015-04-22 DIAGNOSIS — F209 Schizophrenia, unspecified: Secondary | ICD-10-CM | POA: Diagnosis not present

## 2015-05-19 DIAGNOSIS — F209 Schizophrenia, unspecified: Secondary | ICD-10-CM | POA: Diagnosis not present

## 2015-05-29 DIAGNOSIS — K219 Gastro-esophageal reflux disease without esophagitis: Secondary | ICD-10-CM | POA: Diagnosis not present

## 2015-05-29 DIAGNOSIS — R440 Auditory hallucinations: Secondary | ICD-10-CM | POA: Diagnosis not present

## 2015-05-29 DIAGNOSIS — R000 Tachycardia, unspecified: Secondary | ICD-10-CM | POA: Diagnosis not present

## 2015-05-29 DIAGNOSIS — Z72820 Sleep deprivation: Secondary | ICD-10-CM | POA: Diagnosis not present

## 2015-05-29 DIAGNOSIS — F209 Schizophrenia, unspecified: Secondary | ICD-10-CM | POA: Diagnosis not present

## 2015-05-29 DIAGNOSIS — Z79899 Other long term (current) drug therapy: Secondary | ICD-10-CM | POA: Diagnosis not present

## 2015-05-30 DIAGNOSIS — F250 Schizoaffective disorder, bipolar type: Secondary | ICD-10-CM | POA: Diagnosis present

## 2015-05-30 DIAGNOSIS — F209 Schizophrenia, unspecified: Secondary | ICD-10-CM | POA: Diagnosis not present

## 2015-05-30 DIAGNOSIS — K219 Gastro-esophageal reflux disease without esophagitis: Secondary | ICD-10-CM | POA: Diagnosis present

## 2015-05-30 DIAGNOSIS — E039 Hypothyroidism, unspecified: Secondary | ICD-10-CM | POA: Diagnosis present

## 2015-05-30 DIAGNOSIS — Z72820 Sleep deprivation: Secondary | ICD-10-CM | POA: Diagnosis not present

## 2015-05-30 DIAGNOSIS — R000 Tachycardia, unspecified: Secondary | ICD-10-CM | POA: Diagnosis not present

## 2015-05-30 DIAGNOSIS — Z79899 Other long term (current) drug therapy: Secondary | ICD-10-CM | POA: Diagnosis not present

## 2015-06-12 DIAGNOSIS — F209 Schizophrenia, unspecified: Secondary | ICD-10-CM | POA: Diagnosis not present

## 2015-06-12 DIAGNOSIS — Z720 Tobacco use: Secondary | ICD-10-CM | POA: Diagnosis not present

## 2015-06-12 DIAGNOSIS — F17210 Nicotine dependence, cigarettes, uncomplicated: Secondary | ICD-10-CM | POA: Diagnosis not present

## 2015-06-12 DIAGNOSIS — I10 Essential (primary) hypertension: Secondary | ICD-10-CM | POA: Diagnosis not present

## 2015-06-12 DIAGNOSIS — Z299 Encounter for prophylactic measures, unspecified: Secondary | ICD-10-CM | POA: Diagnosis not present

## 2015-06-12 DIAGNOSIS — N400 Enlarged prostate without lower urinary tract symptoms: Secondary | ICD-10-CM | POA: Diagnosis not present

## 2015-06-24 DIAGNOSIS — F209 Schizophrenia, unspecified: Secondary | ICD-10-CM | POA: Diagnosis not present

## 2015-07-15 DIAGNOSIS — F209 Schizophrenia, unspecified: Secondary | ICD-10-CM | POA: Diagnosis not present

## 2015-08-11 DIAGNOSIS — F209 Schizophrenia, unspecified: Secondary | ICD-10-CM | POA: Diagnosis not present

## 2015-08-13 DIAGNOSIS — M79604 Pain in right leg: Secondary | ICD-10-CM | POA: Diagnosis not present

## 2015-08-13 DIAGNOSIS — I10 Essential (primary) hypertension: Secondary | ICD-10-CM | POA: Diagnosis not present

## 2015-08-13 DIAGNOSIS — N400 Enlarged prostate without lower urinary tract symptoms: Secondary | ICD-10-CM | POA: Diagnosis not present

## 2015-08-13 DIAGNOSIS — F209 Schizophrenia, unspecified: Secondary | ICD-10-CM | POA: Diagnosis not present

## 2015-09-15 DIAGNOSIS — F209 Schizophrenia, unspecified: Secondary | ICD-10-CM | POA: Diagnosis not present

## 2015-11-07 DIAGNOSIS — I10 Essential (primary) hypertension: Secondary | ICD-10-CM | POA: Diagnosis not present

## 2015-11-07 DIAGNOSIS — N400 Enlarged prostate without lower urinary tract symptoms: Secondary | ICD-10-CM | POA: Diagnosis not present

## 2015-11-07 DIAGNOSIS — Z299 Encounter for prophylactic measures, unspecified: Secondary | ICD-10-CM | POA: Diagnosis not present

## 2015-11-07 DIAGNOSIS — G4700 Insomnia, unspecified: Secondary | ICD-10-CM | POA: Diagnosis not present

## 2015-11-07 DIAGNOSIS — F209 Schizophrenia, unspecified: Secondary | ICD-10-CM | POA: Diagnosis not present

## 2015-11-21 DIAGNOSIS — Z23 Encounter for immunization: Secondary | ICD-10-CM | POA: Diagnosis not present

## 2015-11-24 DIAGNOSIS — F209 Schizophrenia, unspecified: Secondary | ICD-10-CM | POA: Diagnosis not present

## 2015-12-15 DIAGNOSIS — Z6832 Body mass index (BMI) 32.0-32.9, adult: Secondary | ICD-10-CM | POA: Diagnosis not present

## 2015-12-15 DIAGNOSIS — R030 Elevated blood-pressure reading, without diagnosis of hypertension: Secondary | ICD-10-CM | POA: Diagnosis not present

## 2015-12-15 DIAGNOSIS — Z713 Dietary counseling and surveillance: Secondary | ICD-10-CM | POA: Diagnosis not present

## 2015-12-15 DIAGNOSIS — F209 Schizophrenia, unspecified: Secondary | ICD-10-CM | POA: Diagnosis not present

## 2015-12-15 DIAGNOSIS — Z299 Encounter for prophylactic measures, unspecified: Secondary | ICD-10-CM | POA: Diagnosis not present

## 2015-12-15 DIAGNOSIS — L03211 Cellulitis of face: Secondary | ICD-10-CM | POA: Diagnosis not present

## 2015-12-15 DIAGNOSIS — L0201 Cutaneous abscess of face: Secondary | ICD-10-CM | POA: Diagnosis not present

## 2015-12-30 DIAGNOSIS — R5383 Other fatigue: Secondary | ICD-10-CM | POA: Diagnosis not present

## 2015-12-30 DIAGNOSIS — Z125 Encounter for screening for malignant neoplasm of prostate: Secondary | ICD-10-CM | POA: Diagnosis not present

## 2015-12-30 DIAGNOSIS — Z79899 Other long term (current) drug therapy: Secondary | ICD-10-CM | POA: Diagnosis not present

## 2015-12-30 DIAGNOSIS — E039 Hypothyroidism, unspecified: Secondary | ICD-10-CM | POA: Diagnosis not present

## 2015-12-30 DIAGNOSIS — Z1389 Encounter for screening for other disorder: Secondary | ICD-10-CM | POA: Diagnosis not present

## 2015-12-30 DIAGNOSIS — Z7189 Other specified counseling: Secondary | ICD-10-CM | POA: Diagnosis not present

## 2015-12-30 DIAGNOSIS — Z0000 Encounter for general adult medical examination without abnormal findings: Secondary | ICD-10-CM | POA: Diagnosis not present

## 2015-12-30 DIAGNOSIS — Z1211 Encounter for screening for malignant neoplasm of colon: Secondary | ICD-10-CM | POA: Diagnosis not present

## 2015-12-30 DIAGNOSIS — Z299 Encounter for prophylactic measures, unspecified: Secondary | ICD-10-CM | POA: Diagnosis not present

## 2016-02-19 DIAGNOSIS — F209 Schizophrenia, unspecified: Secondary | ICD-10-CM | POA: Diagnosis not present

## 2016-04-06 DIAGNOSIS — F209 Schizophrenia, unspecified: Secondary | ICD-10-CM | POA: Diagnosis not present

## 2016-04-06 DIAGNOSIS — Z6832 Body mass index (BMI) 32.0-32.9, adult: Secondary | ICD-10-CM | POA: Diagnosis not present

## 2016-04-06 DIAGNOSIS — Z713 Dietary counseling and surveillance: Secondary | ICD-10-CM | POA: Diagnosis not present

## 2016-04-06 DIAGNOSIS — Z299 Encounter for prophylactic measures, unspecified: Secondary | ICD-10-CM | POA: Diagnosis not present

## 2016-04-06 DIAGNOSIS — E119 Type 2 diabetes mellitus without complications: Secondary | ICD-10-CM | POA: Diagnosis not present

## 2016-04-06 DIAGNOSIS — F17210 Nicotine dependence, cigarettes, uncomplicated: Secondary | ICD-10-CM | POA: Diagnosis not present

## 2016-04-06 DIAGNOSIS — N400 Enlarged prostate without lower urinary tract symptoms: Secondary | ICD-10-CM | POA: Diagnosis not present

## 2016-04-06 DIAGNOSIS — I10 Essential (primary) hypertension: Secondary | ICD-10-CM | POA: Diagnosis not present

## 2016-06-07 DIAGNOSIS — F209 Schizophrenia, unspecified: Secondary | ICD-10-CM | POA: Diagnosis not present

## 2016-07-12 DIAGNOSIS — I10 Essential (primary) hypertension: Secondary | ICD-10-CM | POA: Diagnosis not present

## 2016-07-12 DIAGNOSIS — Z299 Encounter for prophylactic measures, unspecified: Secondary | ICD-10-CM | POA: Diagnosis not present

## 2016-07-12 DIAGNOSIS — Z6830 Body mass index (BMI) 30.0-30.9, adult: Secondary | ICD-10-CM | POA: Diagnosis not present

## 2016-07-12 DIAGNOSIS — F209 Schizophrenia, unspecified: Secondary | ICD-10-CM | POA: Diagnosis not present

## 2016-07-12 DIAGNOSIS — E039 Hypothyroidism, unspecified: Secondary | ICD-10-CM | POA: Diagnosis not present

## 2016-07-12 DIAGNOSIS — N400 Enlarged prostate without lower urinary tract symptoms: Secondary | ICD-10-CM | POA: Diagnosis not present

## 2016-07-12 DIAGNOSIS — M79604 Pain in right leg: Secondary | ICD-10-CM | POA: Diagnosis not present

## 2016-07-12 DIAGNOSIS — L409 Psoriasis, unspecified: Secondary | ICD-10-CM | POA: Diagnosis not present

## 2016-07-12 DIAGNOSIS — E119 Type 2 diabetes mellitus without complications: Secondary | ICD-10-CM | POA: Diagnosis not present

## 2016-07-12 DIAGNOSIS — F17210 Nicotine dependence, cigarettes, uncomplicated: Secondary | ICD-10-CM | POA: Diagnosis not present

## 2016-07-12 DIAGNOSIS — K219 Gastro-esophageal reflux disease without esophagitis: Secondary | ICD-10-CM | POA: Diagnosis not present

## 2016-09-28 DIAGNOSIS — F209 Schizophrenia, unspecified: Secondary | ICD-10-CM | POA: Diagnosis not present

## 2016-10-18 DIAGNOSIS — Z299 Encounter for prophylactic measures, unspecified: Secondary | ICD-10-CM | POA: Diagnosis not present

## 2016-10-18 DIAGNOSIS — Z713 Dietary counseling and surveillance: Secondary | ICD-10-CM | POA: Diagnosis not present

## 2016-10-18 DIAGNOSIS — F17210 Nicotine dependence, cigarettes, uncomplicated: Secondary | ICD-10-CM | POA: Diagnosis not present

## 2016-10-18 DIAGNOSIS — N400 Enlarged prostate without lower urinary tract symptoms: Secondary | ICD-10-CM | POA: Diagnosis not present

## 2016-10-18 DIAGNOSIS — I10 Essential (primary) hypertension: Secondary | ICD-10-CM | POA: Diagnosis not present

## 2016-10-18 DIAGNOSIS — Z6828 Body mass index (BMI) 28.0-28.9, adult: Secondary | ICD-10-CM | POA: Diagnosis not present

## 2016-10-18 DIAGNOSIS — E1165 Type 2 diabetes mellitus with hyperglycemia: Secondary | ICD-10-CM | POA: Diagnosis not present

## 2016-10-18 DIAGNOSIS — F209 Schizophrenia, unspecified: Secondary | ICD-10-CM | POA: Diagnosis not present

## 2016-10-18 DIAGNOSIS — E039 Hypothyroidism, unspecified: Secondary | ICD-10-CM | POA: Diagnosis not present

## 2016-10-18 DIAGNOSIS — E119 Type 2 diabetes mellitus without complications: Secondary | ICD-10-CM | POA: Diagnosis not present

## 2016-11-18 DIAGNOSIS — Z23 Encounter for immunization: Secondary | ICD-10-CM | POA: Diagnosis not present

## 2017-01-05 DIAGNOSIS — F209 Schizophrenia, unspecified: Secondary | ICD-10-CM | POA: Diagnosis not present

## 2017-01-25 DIAGNOSIS — Z7189 Other specified counseling: Secondary | ICD-10-CM | POA: Diagnosis not present

## 2017-01-25 DIAGNOSIS — E039 Hypothyroidism, unspecified: Secondary | ICD-10-CM | POA: Diagnosis not present

## 2017-01-25 DIAGNOSIS — E1165 Type 2 diabetes mellitus with hyperglycemia: Secondary | ICD-10-CM | POA: Diagnosis not present

## 2017-01-25 DIAGNOSIS — Z6829 Body mass index (BMI) 29.0-29.9, adult: Secondary | ICD-10-CM | POA: Diagnosis not present

## 2017-01-25 DIAGNOSIS — Z79899 Other long term (current) drug therapy: Secondary | ICD-10-CM | POA: Diagnosis not present

## 2017-01-25 DIAGNOSIS — Z1331 Encounter for screening for depression: Secondary | ICD-10-CM | POA: Diagnosis not present

## 2017-01-25 DIAGNOSIS — Z1211 Encounter for screening for malignant neoplasm of colon: Secondary | ICD-10-CM | POA: Diagnosis not present

## 2017-01-25 DIAGNOSIS — I10 Essential (primary) hypertension: Secondary | ICD-10-CM | POA: Diagnosis not present

## 2017-01-25 DIAGNOSIS — Z1339 Encounter for screening examination for other mental health and behavioral disorders: Secondary | ICD-10-CM | POA: Diagnosis not present

## 2017-01-25 DIAGNOSIS — Z0000 Encounter for general adult medical examination without abnormal findings: Secondary | ICD-10-CM | POA: Diagnosis not present

## 2017-01-25 DIAGNOSIS — R5383 Other fatigue: Secondary | ICD-10-CM | POA: Diagnosis not present

## 2017-01-25 DIAGNOSIS — Z125 Encounter for screening for malignant neoplasm of prostate: Secondary | ICD-10-CM | POA: Diagnosis not present

## 2017-01-25 DIAGNOSIS — Z299 Encounter for prophylactic measures, unspecified: Secondary | ICD-10-CM | POA: Diagnosis not present

## 2017-04-27 DIAGNOSIS — Z6829 Body mass index (BMI) 29.0-29.9, adult: Secondary | ICD-10-CM | POA: Diagnosis not present

## 2017-04-27 DIAGNOSIS — E1165 Type 2 diabetes mellitus with hyperglycemia: Secondary | ICD-10-CM | POA: Diagnosis not present

## 2017-04-27 DIAGNOSIS — F17210 Nicotine dependence, cigarettes, uncomplicated: Secondary | ICD-10-CM | POA: Diagnosis not present

## 2017-04-27 DIAGNOSIS — I10 Essential (primary) hypertension: Secondary | ICD-10-CM | POA: Diagnosis not present

## 2017-04-27 DIAGNOSIS — Z299 Encounter for prophylactic measures, unspecified: Secondary | ICD-10-CM | POA: Diagnosis not present

## 2017-04-27 DIAGNOSIS — E039 Hypothyroidism, unspecified: Secondary | ICD-10-CM | POA: Diagnosis not present

## 2017-04-29 DIAGNOSIS — F209 Schizophrenia, unspecified: Secondary | ICD-10-CM | POA: Diagnosis not present

## 2017-08-04 DIAGNOSIS — F209 Schizophrenia, unspecified: Secondary | ICD-10-CM | POA: Diagnosis not present

## 2017-08-04 DIAGNOSIS — Z6827 Body mass index (BMI) 27.0-27.9, adult: Secondary | ICD-10-CM | POA: Diagnosis not present

## 2017-08-04 DIAGNOSIS — I10 Essential (primary) hypertension: Secondary | ICD-10-CM | POA: Diagnosis not present

## 2017-08-04 DIAGNOSIS — F17210 Nicotine dependence, cigarettes, uncomplicated: Secondary | ICD-10-CM | POA: Diagnosis not present

## 2017-08-04 DIAGNOSIS — E1165 Type 2 diabetes mellitus with hyperglycemia: Secondary | ICD-10-CM | POA: Diagnosis not present

## 2017-08-04 DIAGNOSIS — Z299 Encounter for prophylactic measures, unspecified: Secondary | ICD-10-CM | POA: Diagnosis not present

## 2017-08-17 DIAGNOSIS — F209 Schizophrenia, unspecified: Secondary | ICD-10-CM | POA: Diagnosis not present

## 2017-11-10 DIAGNOSIS — Z6825 Body mass index (BMI) 25.0-25.9, adult: Secondary | ICD-10-CM | POA: Diagnosis not present

## 2017-11-10 DIAGNOSIS — I10 Essential (primary) hypertension: Secondary | ICD-10-CM | POA: Diagnosis not present

## 2017-11-10 DIAGNOSIS — E1165 Type 2 diabetes mellitus with hyperglycemia: Secondary | ICD-10-CM | POA: Diagnosis not present

## 2017-11-10 DIAGNOSIS — E039 Hypothyroidism, unspecified: Secondary | ICD-10-CM | POA: Diagnosis not present

## 2017-11-10 DIAGNOSIS — Z299 Encounter for prophylactic measures, unspecified: Secondary | ICD-10-CM | POA: Diagnosis not present

## 2017-11-10 DIAGNOSIS — F17210 Nicotine dependence, cigarettes, uncomplicated: Secondary | ICD-10-CM | POA: Diagnosis not present

## 2017-11-10 DIAGNOSIS — F209 Schizophrenia, unspecified: Secondary | ICD-10-CM | POA: Diagnosis not present

## 2017-11-14 DIAGNOSIS — Z23 Encounter for immunization: Secondary | ICD-10-CM | POA: Diagnosis not present

## 2017-12-07 DIAGNOSIS — F209 Schizophrenia, unspecified: Secondary | ICD-10-CM | POA: Diagnosis not present

## 2018-02-02 DIAGNOSIS — F209 Schizophrenia, unspecified: Secondary | ICD-10-CM | POA: Diagnosis not present

## 2018-03-02 DIAGNOSIS — F209 Schizophrenia, unspecified: Secondary | ICD-10-CM | POA: Diagnosis not present

## 2018-03-10 DIAGNOSIS — Z6825 Body mass index (BMI) 25.0-25.9, adult: Secondary | ICD-10-CM | POA: Diagnosis not present

## 2018-03-10 DIAGNOSIS — I10 Essential (primary) hypertension: Secondary | ICD-10-CM | POA: Diagnosis not present

## 2018-03-10 DIAGNOSIS — Z0000 Encounter for general adult medical examination without abnormal findings: Secondary | ICD-10-CM | POA: Diagnosis not present

## 2018-03-10 DIAGNOSIS — F17210 Nicotine dependence, cigarettes, uncomplicated: Secondary | ICD-10-CM | POA: Diagnosis not present

## 2018-03-10 DIAGNOSIS — Z1211 Encounter for screening for malignant neoplasm of colon: Secondary | ICD-10-CM | POA: Diagnosis not present

## 2018-03-10 DIAGNOSIS — F209 Schizophrenia, unspecified: Secondary | ICD-10-CM | POA: Diagnosis not present

## 2018-03-10 DIAGNOSIS — Z1339 Encounter for screening examination for other mental health and behavioral disorders: Secondary | ICD-10-CM | POA: Diagnosis not present

## 2018-03-10 DIAGNOSIS — R5383 Other fatigue: Secondary | ICD-10-CM | POA: Diagnosis not present

## 2018-03-10 DIAGNOSIS — Z299 Encounter for prophylactic measures, unspecified: Secondary | ICD-10-CM | POA: Diagnosis not present

## 2018-03-10 DIAGNOSIS — Z7189 Other specified counseling: Secondary | ICD-10-CM | POA: Diagnosis not present

## 2018-03-10 DIAGNOSIS — Z1331 Encounter for screening for depression: Secondary | ICD-10-CM | POA: Diagnosis not present

## 2018-03-10 DIAGNOSIS — E1165 Type 2 diabetes mellitus with hyperglycemia: Secondary | ICD-10-CM | POA: Diagnosis not present

## 2018-03-30 DIAGNOSIS — F209 Schizophrenia, unspecified: Secondary | ICD-10-CM | POA: Diagnosis not present

## 2018-04-05 DIAGNOSIS — I10 Essential (primary) hypertension: Secondary | ICD-10-CM | POA: Diagnosis not present

## 2018-04-05 DIAGNOSIS — E1165 Type 2 diabetes mellitus with hyperglycemia: Secondary | ICD-10-CM | POA: Diagnosis not present

## 2018-04-05 DIAGNOSIS — Z299 Encounter for prophylactic measures, unspecified: Secondary | ICD-10-CM | POA: Diagnosis not present

## 2018-04-05 DIAGNOSIS — F17210 Nicotine dependence, cigarettes, uncomplicated: Secondary | ICD-10-CM | POA: Diagnosis not present

## 2018-04-05 DIAGNOSIS — E039 Hypothyroidism, unspecified: Secondary | ICD-10-CM | POA: Diagnosis not present

## 2018-04-05 DIAGNOSIS — Z6825 Body mass index (BMI) 25.0-25.9, adult: Secondary | ICD-10-CM | POA: Diagnosis not present

## 2018-04-05 DIAGNOSIS — F209 Schizophrenia, unspecified: Secondary | ICD-10-CM | POA: Diagnosis not present

## 2018-04-27 DIAGNOSIS — F209 Schizophrenia, unspecified: Secondary | ICD-10-CM | POA: Diagnosis not present

## 2018-06-01 DIAGNOSIS — I10 Essential (primary) hypertension: Secondary | ICD-10-CM | POA: Diagnosis not present

## 2018-06-01 DIAGNOSIS — Z6825 Body mass index (BMI) 25.0-25.9, adult: Secondary | ICD-10-CM | POA: Diagnosis not present

## 2018-06-01 DIAGNOSIS — E1165 Type 2 diabetes mellitus with hyperglycemia: Secondary | ICD-10-CM | POA: Diagnosis not present

## 2018-06-01 DIAGNOSIS — F209 Schizophrenia, unspecified: Secondary | ICD-10-CM | POA: Diagnosis not present

## 2018-06-01 DIAGNOSIS — E039 Hypothyroidism, unspecified: Secondary | ICD-10-CM | POA: Diagnosis not present

## 2018-06-01 DIAGNOSIS — Z299 Encounter for prophylactic measures, unspecified: Secondary | ICD-10-CM | POA: Diagnosis not present

## 2018-06-22 DIAGNOSIS — F209 Schizophrenia, unspecified: Secondary | ICD-10-CM | POA: Diagnosis not present

## 2018-07-11 DIAGNOSIS — Z6824 Body mass index (BMI) 24.0-24.9, adult: Secondary | ICD-10-CM | POA: Diagnosis not present

## 2018-07-11 DIAGNOSIS — F209 Schizophrenia, unspecified: Secondary | ICD-10-CM | POA: Diagnosis not present

## 2018-07-11 DIAGNOSIS — Z299 Encounter for prophylactic measures, unspecified: Secondary | ICD-10-CM | POA: Diagnosis not present

## 2018-07-11 DIAGNOSIS — I10 Essential (primary) hypertension: Secondary | ICD-10-CM | POA: Diagnosis not present

## 2018-07-11 DIAGNOSIS — F17210 Nicotine dependence, cigarettes, uncomplicated: Secondary | ICD-10-CM | POA: Diagnosis not present

## 2018-07-11 DIAGNOSIS — E1165 Type 2 diabetes mellitus with hyperglycemia: Secondary | ICD-10-CM | POA: Diagnosis not present

## 2018-07-20 DIAGNOSIS — F209 Schizophrenia, unspecified: Secondary | ICD-10-CM | POA: Diagnosis not present

## 2018-08-17 DIAGNOSIS — F209 Schizophrenia, unspecified: Secondary | ICD-10-CM | POA: Diagnosis not present

## 2018-09-14 DIAGNOSIS — F209 Schizophrenia, unspecified: Secondary | ICD-10-CM | POA: Diagnosis not present

## 2018-10-12 DIAGNOSIS — F209 Schizophrenia, unspecified: Secondary | ICD-10-CM | POA: Diagnosis not present

## 2018-10-19 DIAGNOSIS — E1165 Type 2 diabetes mellitus with hyperglycemia: Secondary | ICD-10-CM | POA: Diagnosis not present

## 2018-10-19 DIAGNOSIS — E039 Hypothyroidism, unspecified: Secondary | ICD-10-CM | POA: Diagnosis not present

## 2018-10-19 DIAGNOSIS — Z6822 Body mass index (BMI) 22.0-22.9, adult: Secondary | ICD-10-CM | POA: Diagnosis not present

## 2018-10-19 DIAGNOSIS — I10 Essential (primary) hypertension: Secondary | ICD-10-CM | POA: Diagnosis not present

## 2018-10-19 DIAGNOSIS — F209 Schizophrenia, unspecified: Secondary | ICD-10-CM | POA: Diagnosis not present

## 2018-10-19 DIAGNOSIS — E43 Unspecified severe protein-calorie malnutrition: Secondary | ICD-10-CM | POA: Diagnosis not present

## 2018-10-19 DIAGNOSIS — Z299 Encounter for prophylactic measures, unspecified: Secondary | ICD-10-CM | POA: Diagnosis not present

## 2018-11-09 DIAGNOSIS — Z23 Encounter for immunization: Secondary | ICD-10-CM | POA: Diagnosis not present

## 2018-11-09 DIAGNOSIS — F209 Schizophrenia, unspecified: Secondary | ICD-10-CM | POA: Diagnosis not present

## 2018-12-05 DIAGNOSIS — F209 Schizophrenia, unspecified: Secondary | ICD-10-CM | POA: Diagnosis not present

## 2018-12-05 DIAGNOSIS — Z299 Encounter for prophylactic measures, unspecified: Secondary | ICD-10-CM | POA: Diagnosis not present

## 2018-12-05 DIAGNOSIS — Z6822 Body mass index (BMI) 22.0-22.9, adult: Secondary | ICD-10-CM | POA: Diagnosis not present

## 2018-12-05 DIAGNOSIS — E1165 Type 2 diabetes mellitus with hyperglycemia: Secondary | ICD-10-CM | POA: Diagnosis not present

## 2018-12-05 DIAGNOSIS — E039 Hypothyroidism, unspecified: Secondary | ICD-10-CM | POA: Diagnosis not present

## 2018-12-05 DIAGNOSIS — I10 Essential (primary) hypertension: Secondary | ICD-10-CM | POA: Diagnosis not present

## 2018-12-05 DIAGNOSIS — E43 Unspecified severe protein-calorie malnutrition: Secondary | ICD-10-CM | POA: Diagnosis not present

## 2018-12-07 DIAGNOSIS — F209 Schizophrenia, unspecified: Secondary | ICD-10-CM | POA: Diagnosis not present

## 2019-01-04 DIAGNOSIS — F209 Schizophrenia, unspecified: Secondary | ICD-10-CM | POA: Diagnosis not present

## 2019-02-01 DIAGNOSIS — F209 Schizophrenia, unspecified: Secondary | ICD-10-CM | POA: Diagnosis not present

## 2019-03-01 DIAGNOSIS — F209 Schizophrenia, unspecified: Secondary | ICD-10-CM | POA: Diagnosis not present

## 2019-03-14 DIAGNOSIS — R5383 Other fatigue: Secondary | ICD-10-CM | POA: Diagnosis not present

## 2019-03-14 DIAGNOSIS — Z1339 Encounter for screening examination for other mental health and behavioral disorders: Secondary | ICD-10-CM | POA: Diagnosis not present

## 2019-03-14 DIAGNOSIS — Z0000 Encounter for general adult medical examination without abnormal findings: Secondary | ICD-10-CM | POA: Diagnosis not present

## 2019-03-14 DIAGNOSIS — Z125 Encounter for screening for malignant neoplasm of prostate: Secondary | ICD-10-CM | POA: Diagnosis not present

## 2019-03-14 DIAGNOSIS — Z299 Encounter for prophylactic measures, unspecified: Secondary | ICD-10-CM | POA: Diagnosis not present

## 2019-03-14 DIAGNOSIS — E039 Hypothyroidism, unspecified: Secondary | ICD-10-CM | POA: Diagnosis not present

## 2019-03-14 DIAGNOSIS — Z79899 Other long term (current) drug therapy: Secondary | ICD-10-CM | POA: Diagnosis not present

## 2019-03-14 DIAGNOSIS — Z7189 Other specified counseling: Secondary | ICD-10-CM | POA: Diagnosis not present

## 2019-03-14 DIAGNOSIS — Z1331 Encounter for screening for depression: Secondary | ICD-10-CM | POA: Diagnosis not present

## 2019-03-14 DIAGNOSIS — I10 Essential (primary) hypertension: Secondary | ICD-10-CM | POA: Diagnosis not present

## 2019-03-14 DIAGNOSIS — Z1211 Encounter for screening for malignant neoplasm of colon: Secondary | ICD-10-CM | POA: Diagnosis not present

## 2019-03-14 DIAGNOSIS — Z6824 Body mass index (BMI) 24.0-24.9, adult: Secondary | ICD-10-CM | POA: Diagnosis not present

## 2019-03-14 DIAGNOSIS — E7800 Pure hypercholesterolemia, unspecified: Secondary | ICD-10-CM | POA: Diagnosis not present

## 2019-03-14 DIAGNOSIS — F17210 Nicotine dependence, cigarettes, uncomplicated: Secondary | ICD-10-CM | POA: Diagnosis not present

## 2019-03-22 DIAGNOSIS — F209 Schizophrenia, unspecified: Secondary | ICD-10-CM | POA: Diagnosis not present

## 2019-03-29 DIAGNOSIS — F209 Schizophrenia, unspecified: Secondary | ICD-10-CM | POA: Diagnosis not present

## 2019-04-26 DIAGNOSIS — F209 Schizophrenia, unspecified: Secondary | ICD-10-CM | POA: Diagnosis not present

## 2019-05-10 DIAGNOSIS — Z23 Encounter for immunization: Secondary | ICD-10-CM | POA: Diagnosis not present

## 2019-05-24 DIAGNOSIS — Z299 Encounter for prophylactic measures, unspecified: Secondary | ICD-10-CM | POA: Diagnosis not present

## 2019-05-24 DIAGNOSIS — E1165 Type 2 diabetes mellitus with hyperglycemia: Secondary | ICD-10-CM | POA: Diagnosis not present

## 2019-05-24 DIAGNOSIS — F209 Schizophrenia, unspecified: Secondary | ICD-10-CM | POA: Diagnosis not present

## 2019-05-24 DIAGNOSIS — I10 Essential (primary) hypertension: Secondary | ICD-10-CM | POA: Diagnosis not present

## 2019-05-24 DIAGNOSIS — F17210 Nicotine dependence, cigarettes, uncomplicated: Secondary | ICD-10-CM | POA: Diagnosis not present

## 2019-06-07 DIAGNOSIS — Z23 Encounter for immunization: Secondary | ICD-10-CM | POA: Diagnosis not present

## 2019-06-18 DIAGNOSIS — E11319 Type 2 diabetes mellitus with unspecified diabetic retinopathy without macular edema: Secondary | ICD-10-CM | POA: Diagnosis not present

## 2019-06-21 DIAGNOSIS — F209 Schizophrenia, unspecified: Secondary | ICD-10-CM | POA: Diagnosis not present

## 2019-07-12 DIAGNOSIS — F209 Schizophrenia, unspecified: Secondary | ICD-10-CM | POA: Diagnosis not present

## 2019-07-19 DIAGNOSIS — F209 Schizophrenia, unspecified: Secondary | ICD-10-CM | POA: Diagnosis not present

## 2019-09-05 DIAGNOSIS — F17210 Nicotine dependence, cigarettes, uncomplicated: Secondary | ICD-10-CM | POA: Diagnosis not present

## 2019-09-05 DIAGNOSIS — I10 Essential (primary) hypertension: Secondary | ICD-10-CM | POA: Diagnosis not present

## 2019-09-05 DIAGNOSIS — E1165 Type 2 diabetes mellitus with hyperglycemia: Secondary | ICD-10-CM | POA: Diagnosis not present

## 2019-09-05 DIAGNOSIS — D692 Other nonthrombocytopenic purpura: Secondary | ICD-10-CM | POA: Diagnosis not present

## 2019-09-05 DIAGNOSIS — Z299 Encounter for prophylactic measures, unspecified: Secondary | ICD-10-CM | POA: Diagnosis not present

## 2019-09-21 DIAGNOSIS — E039 Hypothyroidism, unspecified: Secondary | ICD-10-CM | POA: Diagnosis not present

## 2019-09-21 DIAGNOSIS — E1165 Type 2 diabetes mellitus with hyperglycemia: Secondary | ICD-10-CM | POA: Diagnosis not present

## 2019-09-21 DIAGNOSIS — E785 Hyperlipidemia, unspecified: Secondary | ICD-10-CM | POA: Diagnosis not present

## 2019-10-23 DIAGNOSIS — E1165 Type 2 diabetes mellitus with hyperglycemia: Secondary | ICD-10-CM | POA: Diagnosis not present

## 2019-10-23 DIAGNOSIS — E7849 Other hyperlipidemia: Secondary | ICD-10-CM | POA: Diagnosis not present

## 2019-10-23 DIAGNOSIS — I10 Essential (primary) hypertension: Secondary | ICD-10-CM | POA: Diagnosis not present

## 2019-11-22 DIAGNOSIS — E785 Hyperlipidemia, unspecified: Secondary | ICD-10-CM | POA: Diagnosis not present

## 2019-11-22 DIAGNOSIS — E1165 Type 2 diabetes mellitus with hyperglycemia: Secondary | ICD-10-CM | POA: Diagnosis not present

## 2019-11-22 DIAGNOSIS — E039 Hypothyroidism, unspecified: Secondary | ICD-10-CM | POA: Diagnosis not present

## 2019-12-21 DIAGNOSIS — E1165 Type 2 diabetes mellitus with hyperglycemia: Secondary | ICD-10-CM | POA: Diagnosis not present

## 2019-12-21 DIAGNOSIS — E785 Hyperlipidemia, unspecified: Secondary | ICD-10-CM | POA: Diagnosis not present

## 2019-12-21 DIAGNOSIS — E039 Hypothyroidism, unspecified: Secondary | ICD-10-CM | POA: Diagnosis not present

## 2019-12-25 DIAGNOSIS — Z23 Encounter for immunization: Secondary | ICD-10-CM | POA: Diagnosis not present

## 2020-01-11 DIAGNOSIS — E1165 Type 2 diabetes mellitus with hyperglycemia: Secondary | ICD-10-CM | POA: Diagnosis not present

## 2020-01-11 DIAGNOSIS — Z299 Encounter for prophylactic measures, unspecified: Secondary | ICD-10-CM | POA: Diagnosis not present

## 2020-01-11 DIAGNOSIS — I10 Essential (primary) hypertension: Secondary | ICD-10-CM | POA: Diagnosis not present

## 2020-01-11 DIAGNOSIS — Z2821 Immunization not carried out because of patient refusal: Secondary | ICD-10-CM | POA: Diagnosis not present

## 2020-01-22 DIAGNOSIS — E039 Hypothyroidism, unspecified: Secondary | ICD-10-CM | POA: Diagnosis not present

## 2020-01-22 DIAGNOSIS — E785 Hyperlipidemia, unspecified: Secondary | ICD-10-CM | POA: Diagnosis not present

## 2020-01-22 DIAGNOSIS — E1165 Type 2 diabetes mellitus with hyperglycemia: Secondary | ICD-10-CM | POA: Diagnosis not present

## 2020-02-22 DIAGNOSIS — E785 Hyperlipidemia, unspecified: Secondary | ICD-10-CM | POA: Diagnosis not present

## 2020-02-22 DIAGNOSIS — E039 Hypothyroidism, unspecified: Secondary | ICD-10-CM | POA: Diagnosis not present

## 2020-02-22 DIAGNOSIS — E1165 Type 2 diabetes mellitus with hyperglycemia: Secondary | ICD-10-CM | POA: Diagnosis not present

## 2020-03-24 DIAGNOSIS — Z87891 Personal history of nicotine dependence: Secondary | ICD-10-CM | POA: Diagnosis not present

## 2020-03-24 DIAGNOSIS — Z299 Encounter for prophylactic measures, unspecified: Secondary | ICD-10-CM | POA: Diagnosis not present

## 2020-03-24 DIAGNOSIS — R5383 Other fatigue: Secondary | ICD-10-CM | POA: Diagnosis not present

## 2020-03-24 DIAGNOSIS — Z7189 Other specified counseling: Secondary | ICD-10-CM | POA: Diagnosis not present

## 2020-03-24 DIAGNOSIS — Z0000 Encounter for general adult medical examination without abnormal findings: Secondary | ICD-10-CM | POA: Diagnosis not present

## 2020-03-24 DIAGNOSIS — E785 Hyperlipidemia, unspecified: Secondary | ICD-10-CM | POA: Diagnosis not present

## 2020-03-24 DIAGNOSIS — E039 Hypothyroidism, unspecified: Secondary | ICD-10-CM | POA: Diagnosis not present

## 2020-03-24 DIAGNOSIS — E1165 Type 2 diabetes mellitus with hyperglycemia: Secondary | ICD-10-CM | POA: Diagnosis not present

## 2020-03-24 DIAGNOSIS — E7800 Pure hypercholesterolemia, unspecified: Secondary | ICD-10-CM | POA: Diagnosis not present

## 2020-03-24 DIAGNOSIS — I10 Essential (primary) hypertension: Secondary | ICD-10-CM | POA: Diagnosis not present

## 2020-03-24 DIAGNOSIS — Z79899 Other long term (current) drug therapy: Secondary | ICD-10-CM | POA: Diagnosis not present

## 2020-05-13 DIAGNOSIS — E039 Hypothyroidism, unspecified: Secondary | ICD-10-CM | POA: Diagnosis not present

## 2020-05-13 DIAGNOSIS — E1165 Type 2 diabetes mellitus with hyperglycemia: Secondary | ICD-10-CM | POA: Diagnosis not present

## 2020-05-13 DIAGNOSIS — I10 Essential (primary) hypertension: Secondary | ICD-10-CM | POA: Diagnosis not present

## 2020-05-13 DIAGNOSIS — Z299 Encounter for prophylactic measures, unspecified: Secondary | ICD-10-CM | POA: Diagnosis not present

## 2020-05-21 DIAGNOSIS — E039 Hypothyroidism, unspecified: Secondary | ICD-10-CM | POA: Diagnosis not present

## 2020-05-21 DIAGNOSIS — E785 Hyperlipidemia, unspecified: Secondary | ICD-10-CM | POA: Diagnosis not present

## 2020-05-21 DIAGNOSIS — I10 Essential (primary) hypertension: Secondary | ICD-10-CM | POA: Diagnosis not present

## 2020-08-21 DIAGNOSIS — I10 Essential (primary) hypertension: Secondary | ICD-10-CM | POA: Diagnosis not present

## 2020-08-21 DIAGNOSIS — J449 Chronic obstructive pulmonary disease, unspecified: Secondary | ICD-10-CM | POA: Diagnosis not present

## 2020-09-15 DIAGNOSIS — E1165 Type 2 diabetes mellitus with hyperglycemia: Secondary | ICD-10-CM | POA: Diagnosis not present

## 2020-09-15 DIAGNOSIS — D692 Other nonthrombocytopenic purpura: Secondary | ICD-10-CM | POA: Diagnosis not present

## 2020-09-15 DIAGNOSIS — I10 Essential (primary) hypertension: Secondary | ICD-10-CM | POA: Diagnosis not present

## 2020-09-15 DIAGNOSIS — Z299 Encounter for prophylactic measures, unspecified: Secondary | ICD-10-CM | POA: Diagnosis not present

## 2020-12-22 DIAGNOSIS — I10 Essential (primary) hypertension: Secondary | ICD-10-CM | POA: Diagnosis not present

## 2020-12-22 DIAGNOSIS — J449 Chronic obstructive pulmonary disease, unspecified: Secondary | ICD-10-CM | POA: Diagnosis not present

## 2021-02-18 DIAGNOSIS — L723 Sebaceous cyst: Secondary | ICD-10-CM | POA: Diagnosis not present

## 2021-02-18 DIAGNOSIS — E1165 Type 2 diabetes mellitus with hyperglycemia: Secondary | ICD-10-CM | POA: Diagnosis not present

## 2021-02-18 DIAGNOSIS — Z299 Encounter for prophylactic measures, unspecified: Secondary | ICD-10-CM | POA: Diagnosis not present

## 2021-02-18 DIAGNOSIS — L089 Local infection of the skin and subcutaneous tissue, unspecified: Secondary | ICD-10-CM | POA: Diagnosis not present

## 2021-02-18 DIAGNOSIS — I10 Essential (primary) hypertension: Secondary | ICD-10-CM | POA: Diagnosis not present

## 2021-02-24 DIAGNOSIS — L089 Local infection of the skin and subcutaneous tissue, unspecified: Secondary | ICD-10-CM | POA: Diagnosis not present

## 2021-02-24 DIAGNOSIS — L723 Sebaceous cyst: Secondary | ICD-10-CM | POA: Diagnosis not present

## 2021-02-25 DIAGNOSIS — L723 Sebaceous cyst: Secondary | ICD-10-CM | POA: Diagnosis not present

## 2021-02-25 DIAGNOSIS — L089 Local infection of the skin and subcutaneous tissue, unspecified: Secondary | ICD-10-CM | POA: Diagnosis not present

## 2021-02-26 DIAGNOSIS — Z7984 Long term (current) use of oral hypoglycemic drugs: Secondary | ICD-10-CM | POA: Diagnosis not present

## 2021-02-26 DIAGNOSIS — E039 Hypothyroidism, unspecified: Secondary | ICD-10-CM | POA: Diagnosis not present

## 2021-02-26 DIAGNOSIS — K219 Gastro-esophageal reflux disease without esophagitis: Secondary | ICD-10-CM | POA: Diagnosis not present

## 2021-02-26 DIAGNOSIS — Z87891 Personal history of nicotine dependence: Secondary | ICD-10-CM | POA: Diagnosis not present

## 2021-02-26 DIAGNOSIS — I10 Essential (primary) hypertension: Secondary | ICD-10-CM | POA: Diagnosis not present

## 2021-02-26 DIAGNOSIS — L720 Epidermal cyst: Secondary | ICD-10-CM | POA: Diagnosis not present

## 2021-02-26 DIAGNOSIS — E119 Type 2 diabetes mellitus without complications: Secondary | ICD-10-CM | POA: Diagnosis not present

## 2021-02-26 DIAGNOSIS — L089 Local infection of the skin and subcutaneous tissue, unspecified: Secondary | ICD-10-CM | POA: Diagnosis not present

## 2021-02-26 DIAGNOSIS — Z79899 Other long term (current) drug therapy: Secondary | ICD-10-CM | POA: Diagnosis not present

## 2021-02-27 DIAGNOSIS — E039 Hypothyroidism, unspecified: Secondary | ICD-10-CM | POA: Diagnosis not present

## 2021-02-27 DIAGNOSIS — Z79899 Other long term (current) drug therapy: Secondary | ICD-10-CM | POA: Diagnosis not present

## 2021-02-27 DIAGNOSIS — L02212 Cutaneous abscess of back [any part, except buttock]: Secondary | ICD-10-CM | POA: Diagnosis not present

## 2021-02-27 DIAGNOSIS — Z87891 Personal history of nicotine dependence: Secondary | ICD-10-CM | POA: Diagnosis not present

## 2021-02-27 DIAGNOSIS — Z7984 Long term (current) use of oral hypoglycemic drugs: Secondary | ICD-10-CM | POA: Diagnosis not present

## 2021-02-27 DIAGNOSIS — K219 Gastro-esophageal reflux disease without esophagitis: Secondary | ICD-10-CM | POA: Diagnosis not present

## 2021-02-27 DIAGNOSIS — I872 Venous insufficiency (chronic) (peripheral): Secondary | ICD-10-CM | POA: Diagnosis not present

## 2021-02-27 DIAGNOSIS — E119 Type 2 diabetes mellitus without complications: Secondary | ICD-10-CM | POA: Diagnosis not present

## 2021-02-27 DIAGNOSIS — Z791 Long term (current) use of non-steroidal anti-inflammatories (NSAID): Secondary | ICD-10-CM | POA: Diagnosis not present

## 2021-02-27 DIAGNOSIS — I10 Essential (primary) hypertension: Secondary | ICD-10-CM | POA: Diagnosis not present

## 2021-03-04 DIAGNOSIS — L02212 Cutaneous abscess of back [any part, except buttock]: Secondary | ICD-10-CM | POA: Diagnosis not present

## 2021-03-04 DIAGNOSIS — Z7984 Long term (current) use of oral hypoglycemic drugs: Secondary | ICD-10-CM | POA: Diagnosis not present

## 2021-03-04 DIAGNOSIS — K219 Gastro-esophageal reflux disease without esophagitis: Secondary | ICD-10-CM | POA: Diagnosis not present

## 2021-03-04 DIAGNOSIS — Z791 Long term (current) use of non-steroidal anti-inflammatories (NSAID): Secondary | ICD-10-CM | POA: Diagnosis not present

## 2021-03-04 DIAGNOSIS — Z79899 Other long term (current) drug therapy: Secondary | ICD-10-CM | POA: Diagnosis not present

## 2021-03-04 DIAGNOSIS — E039 Hypothyroidism, unspecified: Secondary | ICD-10-CM | POA: Diagnosis not present

## 2021-03-04 DIAGNOSIS — Z87891 Personal history of nicotine dependence: Secondary | ICD-10-CM | POA: Diagnosis not present

## 2021-03-04 DIAGNOSIS — I10 Essential (primary) hypertension: Secondary | ICD-10-CM | POA: Diagnosis not present

## 2021-03-04 DIAGNOSIS — E119 Type 2 diabetes mellitus without complications: Secondary | ICD-10-CM | POA: Diagnosis not present

## 2021-03-05 DIAGNOSIS — I872 Venous insufficiency (chronic) (peripheral): Secondary | ICD-10-CM | POA: Diagnosis not present

## 2021-03-10 DIAGNOSIS — R220 Localized swelling, mass and lump, head: Secondary | ICD-10-CM | POA: Diagnosis not present

## 2021-03-10 DIAGNOSIS — I10 Essential (primary) hypertension: Secondary | ICD-10-CM | POA: Diagnosis not present

## 2021-03-10 DIAGNOSIS — Z299 Encounter for prophylactic measures, unspecified: Secondary | ICD-10-CM | POA: Diagnosis not present

## 2021-03-10 DIAGNOSIS — M7989 Other specified soft tissue disorders: Secondary | ICD-10-CM | POA: Diagnosis not present

## 2021-03-13 DIAGNOSIS — M7989 Other specified soft tissue disorders: Secondary | ICD-10-CM | POA: Diagnosis not present

## 2021-03-16 DIAGNOSIS — R079 Chest pain, unspecified: Secondary | ICD-10-CM | POA: Diagnosis not present

## 2021-03-16 DIAGNOSIS — M25569 Pain in unspecified knee: Secondary | ICD-10-CM | POA: Diagnosis not present

## 2021-03-16 DIAGNOSIS — R410 Disorientation, unspecified: Secondary | ICD-10-CM | POA: Diagnosis not present

## 2021-03-16 DIAGNOSIS — R531 Weakness: Secondary | ICD-10-CM | POA: Diagnosis not present

## 2021-03-16 DIAGNOSIS — I10 Essential (primary) hypertension: Secondary | ICD-10-CM | POA: Diagnosis not present

## 2021-03-16 DIAGNOSIS — R609 Edema, unspecified: Secondary | ICD-10-CM | POA: Diagnosis not present

## 2021-03-16 DIAGNOSIS — M25561 Pain in right knee: Secondary | ICD-10-CM | POA: Diagnosis not present

## 2021-03-16 DIAGNOSIS — K219 Gastro-esophageal reflux disease without esophagitis: Secondary | ICD-10-CM | POA: Diagnosis not present

## 2021-03-16 DIAGNOSIS — Z20822 Contact with and (suspected) exposure to covid-19: Secondary | ICD-10-CM | POA: Diagnosis not present

## 2021-03-16 DIAGNOSIS — E039 Hypothyroidism, unspecified: Secondary | ICD-10-CM | POA: Diagnosis not present

## 2021-03-16 DIAGNOSIS — E869 Volume depletion, unspecified: Secondary | ICD-10-CM | POA: Diagnosis not present

## 2021-03-16 DIAGNOSIS — Z743 Need for continuous supervision: Secondary | ICD-10-CM | POA: Diagnosis not present

## 2021-03-16 DIAGNOSIS — R52 Pain, unspecified: Secondary | ICD-10-CM | POA: Diagnosis not present

## 2021-03-16 DIAGNOSIS — Z79899 Other long term (current) drug therapy: Secondary | ICD-10-CM | POA: Diagnosis not present

## 2021-03-16 DIAGNOSIS — E119 Type 2 diabetes mellitus without complications: Secondary | ICD-10-CM | POA: Diagnosis not present

## 2021-03-16 DIAGNOSIS — R059 Cough, unspecified: Secondary | ICD-10-CM | POA: Diagnosis not present

## 2021-03-16 DIAGNOSIS — Z87891 Personal history of nicotine dependence: Secondary | ICD-10-CM | POA: Diagnosis not present

## 2021-03-16 DIAGNOSIS — G8929 Other chronic pain: Secondary | ICD-10-CM | POA: Diagnosis not present

## 2021-03-17 ENCOUNTER — Inpatient Hospital Stay (HOSPITAL_COMMUNITY)
Admission: EM | Admit: 2021-03-17 | Discharge: 2021-03-20 | DRG: 682 | Disposition: A | Discharge status: Home Health | Payer: Medicare Other | Attending: Family Medicine | Admitting: Family Medicine

## 2021-03-17 ENCOUNTER — Encounter (HOSPITAL_COMMUNITY): Payer: Self-pay | Admitting: Emergency Medicine

## 2021-03-17 ENCOUNTER — Other Ambulatory Visit: Payer: Self-pay

## 2021-03-17 ENCOUNTER — Emergency Department (HOSPITAL_COMMUNITY): Payer: Medicare Other

## 2021-03-17 DIAGNOSIS — R079 Chest pain, unspecified: Secondary | ICD-10-CM | POA: Diagnosis not present

## 2021-03-17 DIAGNOSIS — Z79899 Other long term (current) drug therapy: Secondary | ICD-10-CM

## 2021-03-17 DIAGNOSIS — F209 Schizophrenia, unspecified: Secondary | ICD-10-CM | POA: Diagnosis present

## 2021-03-17 DIAGNOSIS — R4182 Altered mental status, unspecified: Secondary | ICD-10-CM | POA: Diagnosis not present

## 2021-03-17 DIAGNOSIS — R059 Cough, unspecified: Secondary | ICD-10-CM | POA: Diagnosis not present

## 2021-03-17 DIAGNOSIS — I499 Cardiac arrhythmia, unspecified: Secondary | ICD-10-CM | POA: Diagnosis not present

## 2021-03-17 DIAGNOSIS — G8929 Other chronic pain: Secondary | ICD-10-CM | POA: Diagnosis not present

## 2021-03-17 DIAGNOSIS — R109 Unspecified abdominal pain: Secondary | ICD-10-CM | POA: Diagnosis not present

## 2021-03-17 DIAGNOSIS — E871 Hypo-osmolality and hyponatremia: Secondary | ICD-10-CM | POA: Diagnosis present

## 2021-03-17 DIAGNOSIS — G9341 Metabolic encephalopathy: Secondary | ICD-10-CM | POA: Diagnosis not present

## 2021-03-17 DIAGNOSIS — R339 Retention of urine, unspecified: Secondary | ICD-10-CM | POA: Diagnosis not present

## 2021-03-17 DIAGNOSIS — E878 Other disorders of electrolyte and fluid balance, not elsewhere classified: Secondary | ICD-10-CM | POA: Diagnosis not present

## 2021-03-17 DIAGNOSIS — R338 Other retention of urine: Secondary | ICD-10-CM

## 2021-03-17 DIAGNOSIS — E869 Volume depletion, unspecified: Secondary | ICD-10-CM | POA: Diagnosis not present

## 2021-03-17 DIAGNOSIS — N179 Acute kidney failure, unspecified: Principal | ICD-10-CM | POA: Diagnosis present

## 2021-03-17 DIAGNOSIS — E876 Hypokalemia: Secondary | ICD-10-CM | POA: Diagnosis not present

## 2021-03-17 DIAGNOSIS — F17210 Nicotine dependence, cigarettes, uncomplicated: Secondary | ICD-10-CM | POA: Diagnosis present

## 2021-03-17 DIAGNOSIS — J9811 Atelectasis: Secondary | ICD-10-CM | POA: Diagnosis not present

## 2021-03-17 DIAGNOSIS — R000 Tachycardia, unspecified: Secondary | ICD-10-CM | POA: Diagnosis not present

## 2021-03-17 DIAGNOSIS — Z20822 Contact with and (suspected) exposure to covid-19: Secondary | ICD-10-CM | POA: Diagnosis not present

## 2021-03-17 DIAGNOSIS — E079 Disorder of thyroid, unspecified: Secondary | ICD-10-CM | POA: Diagnosis not present

## 2021-03-17 DIAGNOSIS — R410 Disorientation, unspecified: Secondary | ICD-10-CM | POA: Diagnosis not present

## 2021-03-17 DIAGNOSIS — K5900 Constipation, unspecified: Secondary | ICD-10-CM | POA: Diagnosis not present

## 2021-03-17 DIAGNOSIS — Z743 Need for continuous supervision: Secondary | ICD-10-CM | POA: Diagnosis not present

## 2021-03-17 DIAGNOSIS — M25561 Pain in right knee: Secondary | ICD-10-CM | POA: Diagnosis not present

## 2021-03-17 DIAGNOSIS — R404 Transient alteration of awareness: Secondary | ICD-10-CM | POA: Diagnosis not present

## 2021-03-17 DIAGNOSIS — R6889 Other general symptoms and signs: Secondary | ICD-10-CM | POA: Diagnosis not present

## 2021-03-17 DIAGNOSIS — Z79890 Hormone replacement therapy (postmenopausal): Secondary | ICD-10-CM

## 2021-03-17 DIAGNOSIS — R531 Weakness: Secondary | ICD-10-CM | POA: Diagnosis not present

## 2021-03-17 LAB — COMPREHENSIVE METABOLIC PANEL
ALT: 18 U/L (ref 0–44)
AST: 19 U/L (ref 15–41)
Albumin: 4 g/dL (ref 3.5–5.0)
Alkaline Phosphatase: 78 U/L (ref 38–126)
Anion gap: 11 (ref 5–15)
BUN: 35 mg/dL — ABNORMAL HIGH (ref 8–23)
CO2: 20 mmol/L — ABNORMAL LOW (ref 22–32)
Calcium: 8.6 mg/dL — ABNORMAL LOW (ref 8.9–10.3)
Chloride: 97 mmol/L — ABNORMAL LOW (ref 98–111)
Creatinine, Ser: 2.69 mg/dL — ABNORMAL HIGH (ref 0.61–1.24)
GFR, Estimated: 26 mL/min — ABNORMAL LOW (ref >60)
Glucose, Bld: 143 mg/dL — ABNORMAL HIGH (ref 70–99)
Potassium: 2.9 mmol/L — ABNORMAL LOW (ref 3.5–5.1)
Sodium: 128 mmol/L — ABNORMAL LOW (ref 135–145)
Total Bilirubin: 1 mg/dL (ref 0.3–1.2)
Total Protein: 8 g/dL (ref 6.5–8.1)

## 2021-03-17 LAB — URINALYSIS, ROUTINE W REFLEX MICROSCOPIC
Bilirubin Urine: NEGATIVE
Glucose, UA: NEGATIVE mg/dL
Hgb urine dipstick: NEGATIVE
Ketones, ur: NEGATIVE mg/dL
Leukocytes,Ua: NEGATIVE
Nitrite: NEGATIVE
Protein, ur: NEGATIVE mg/dL
Specific Gravity, Urine: 1.011 (ref 1.005–1.030)
pH: 6 (ref 5.0–8.0)

## 2021-03-17 LAB — CBC WITH DIFFERENTIAL/PLATELET
Abs Immature Granulocytes: 0.04 10*3/uL (ref 0.00–0.07)
Basophils Absolute: 0 10*3/uL (ref 0.0–0.1)
Basophils Relative: 0 %
Eosinophils Absolute: 0 10*3/uL (ref 0.0–0.5)
Eosinophils Relative: 0 %
HCT: 34 % — ABNORMAL LOW (ref 39.0–52.0)
Hemoglobin: 11.6 g/dL — ABNORMAL LOW (ref 13.0–17.0)
Immature Granulocytes: 1 %
Lymphocytes Relative: 13 %
Lymphs Abs: 1.1 10*3/uL (ref 0.7–4.0)
MCH: 31 pg (ref 26.0–34.0)
MCHC: 34.1 g/dL (ref 30.0–36.0)
MCV: 90.9 fL (ref 80.0–100.0)
Monocytes Absolute: 0.7 10*3/uL (ref 0.1–1.0)
Monocytes Relative: 8 %
Neutro Abs: 6.6 10*3/uL (ref 1.7–7.7)
Neutrophils Relative %: 78 %
Platelets: 230 10*3/uL (ref 150–400)
RBC: 3.74 MIL/uL — ABNORMAL LOW (ref 4.22–5.81)
RDW: 13.6 % (ref 11.5–15.5)
WBC: 8.4 10*3/uL (ref 4.0–10.5)
nRBC: 0 % (ref 0.0–0.2)

## 2021-03-17 LAB — AMMONIA: Ammonia: 14 umol/L (ref 9–35)

## 2021-03-17 LAB — CBG MONITORING, ED: Glucose-Capillary: 130 mg/dL — ABNORMAL HIGH (ref 70–99)

## 2021-03-17 LAB — ETHANOL: Alcohol, Ethyl (B): <10 mg/dL (ref <10)

## 2021-03-17 LAB — LACTIC ACID, PLASMA: Lactic Acid, Venous: 1 mmol/L (ref 0.5–1.9)

## 2021-03-17 LAB — RESP PANEL BY RT-PCR (FLU A&B, COVID) ARPGX2
Influenza A by PCR: NEGATIVE
Influenza B by PCR: NEGATIVE
SARS Coronavirus 2 by RT PCR: NEGATIVE

## 2021-03-17 NOTE — ED Provider Notes (Signed)
[name] Provider Note   CSN: [id number] Arrival date & time: 03/17/21  2154     History  Chief Complaint  Patient presents with   Altered Mental Status    [name] is a 66 y.o. male.  HPI     This is a 66 year old male with a history of schizophrenia who presents with altered mental status.  There is no family at bedside to provide collateral information.  Per EMS, family reported he has had increased altered mental status and weakness over the last 24 hours.  General decline over the last year.  Patient is able to tell me his name.  He states that he does not hurt anywhere.  Otherwise he does not provide any information.  Level 5 caveat for altered mental status.  Home Medications Prior to Admission medications   Medication Sig Start Date End Date Taking? Authorizing Provider  benztropine (COGENTIN) 2 MG tablet Take 1 tablet (2 mg total) by mouth at bedtime. 09/28/12   Niel Hummer, NP  clonazePAM (KLONOPIN) 1 MG tablet Take 1 tablet (1 mg total) by mouth at bedtime. 09/28/12   Niel Hummer, NP  fluPHENAZine (PROLIXIN) 10 MG tablet Take 3 tablets (30 mg total) by mouth at bedtime. For mood control. 09/28/12   Niel Hummer, NP  hydrOXYzine (ATARAX/VISTARIL) 50 MG tablet Take 1 tablet (50 mg total) by mouth at bedtime as needed (sleep). 09/28/12   Niel Hummer, NP  levothyroxine (SYNTHROID, LEVOTHROID) 75 MCG tablet Take 1 tablet (75 mcg total) by mouth daily. 09/28/12   Niel Hummer, NP  omeprazole (PRILOSEC) 20 MG capsule Take 1 capsule (20 mg total) by mouth daily. 09/28/12   Niel Hummer, NP  traZODone (DESYREL) 100 MG tablet Take 2 tablets (200 mg total) by mouth at bedtime. For sleep. 09/28/12   Niel Hummer, NP      Allergies    Patient has no known allergies.    Review of Systems   Review of Systems  Unable to perform ROS: Mental status change   Physical Exam Updated Vital Signs BP 110/84    Pulse 86    Temp (!) 96.5 F (35.8 C)  (Rectal)    Resp 19    Ht 1.727 m (5\' 8" )    Wt 78.5 kg    SpO2 100%    BMI 26.31 kg/m  Physical Exam Vitals and nursing note reviewed.  Constitutional:      Appearance: He is well-developed.     Comments: Somnolent but arousable  HENT:     Head: Normocephalic and atraumatic.     Nose: Nose normal.  Eyes:     Pupils: Pupils are equal, round, and reactive to light.  Cardiovascular:     Rate and Rhythm: Regular rhythm. Tachycardia present.     Heart sounds: Normal heart sounds. No murmur heard. Pulmonary:     Effort: Pulmonary effort is normal. No respiratory distress.     Breath sounds: Normal breath sounds. No wheezing.  Abdominal:     General: Bowel sounds are normal. There is distension.     Palpations: Abdomen is soft.     Tenderness: There is no abdominal tenderness. There is no guarding or rebound.  Musculoskeletal:     Cervical back: Neck supple.     Right lower leg: No edema.     Left lower leg: No edema.  Lymphadenopathy:     Cervical: No cervical adenopathy.  Skin:  General: Skin is warm and dry.  Neurological:     Mental Status: He is alert.     Comments: Oriented only to himself, will follow simple commands, appears to follow commands bilaterally and symmetrically  Psychiatric:     Comments: Unable to assess    ED Results / Procedures / Treatments   Labs (all labs ordered are listed, but only abnormal results are displayed) Labs Reviewed  COMPREHENSIVE METABOLIC PANEL - Abnormal; Notable for the following components:      Result Value   Sodium 128 (*)    Potassium 2.9 (*)    Chloride 97 (*)    CO2 20 (*)    Glucose, Bld 143 (*)    BUN 35 (*)    Creatinine, Ser 2.69 (*)    Calcium 8.6 (*)    GFR, Estimated 26 (*)    All other components within normal limits  CBC WITH DIFFERENTIAL/PLATELET - Abnormal; Notable for the following components:   RBC 3.74 (*)    Hemoglobin 11.6 (*)    HCT 34.0 (*)    All other components within normal limits  CBG  MONITORING, ED - Abnormal; Notable for the following components:   Glucose-Capillary 130 (*)    All other components within normal limits  RESP PANEL BY RT-PCR (FLU A&B, COVID) ARPGX2  CULTURE, BLOOD (ROUTINE X 2)  CULTURE, BLOOD (ROUTINE X 2)  URINE CULTURE  URINALYSIS, ROUTINE W REFLEX MICROSCOPIC  RAPID URINE DRUG SCREEN, HOSP PERFORMED  ETHANOL  AMMONIA  LACTIC ACID, PLASMA  LACTIC ACID, PLASMA  CBG MONITORING, ED    EKG EKG Interpretation  Date/Time:  Tuesday March 17 2021 21:59:49 EST Ventricular Rate:  120 PR Interval:  142 QRS Duration: 96 QT Interval:  361 QTC Calculation: 459 R Axis:   26 Text Interpretation: Sinus tachycardia Supraventricular bigeminy Baseline wander in lead(s) V4 Confirmed by Thayer Jew [phone]) on 03/18/2021 12:17:15 AM  Radiology CT ABDOMEN PELVIS WO CONTRAST  Result Date: 03/18/2021 CLINICAL DATA:  Abdominal pain EXAM: CT ABDOMEN AND PELVIS WITHOUT CONTRAST TECHNIQUE: Multidetector CT imaging of the abdomen and pelvis was performed following the standard protocol without IV contrast. RADIATION DOSE REDUCTION: This exam was performed according to the departmental dose-optimization program which includes automated exposure control, adjustment of the mA and/or kV according to patient size and/or use of iterative reconstruction technique. COMPARISON:  None. FINDINGS: Lower chest: Scarring in the lung bases.  No acute abnormality. Hepatobiliary: Probable small layering gallstone within the gallbladder. No suspicious focal hepatic abnormality or biliary ductal dilatation. Pancreas: Atrophy.  No focal abnormality or ductal dilatation. Spleen: No focal abnormality.  Normal size. Adrenals/Urinary Tract: Foley catheter within the bladder which is decompressed. No stones or hydronephrosis. No renal or adrenal mass. Stomach/Bowel: Large stool burden throughout the colon. No evidence of bowel obstruction. Stomach and small bowel grossly unremarkable.  Vascular/Lymphatic: No evidence of aneurysm or adenopathy. Reproductive: Fall No visible focal abnormality. Other: No free fluid or free air. Musculoskeletal: No acute bony abnormality. IMPRESSION: Large stool burden throughout the colon. No acute findings. Electronically Signed   By: Rolm Baptise M.D.   On: 03/18/2021 00:29   CT HEAD WO CONTRAST  Result Date: 03/17/2021 CLINICAL DATA:  Altered mental status. EXAM: CT HEAD WITHOUT CONTRAST TECHNIQUE: Contiguous axial images were obtained from the base of the skull through the vertex without intravenous contrast. RADIATION DOSE REDUCTION: This exam was performed according to the departmental dose-optimization program which includes automated exposure control, adjustment of the mA  and/or kV according to patient size and/or use of iterative reconstruction technique. COMPARISON:  Head CT dated 03/16/2021. FINDINGS: Brain: Mild age-related atrophy and chronic microvascular ischemic changes. There is no acute intracranial hemorrhage. No mass effect or midline shift. No extra-axial fluid collection. Vascular: No hyperdense vessel or unexpected calcification. Skull: Normal. Negative for fracture or focal lesion. Sinuses/Orbits: No acute finding. Other: None IMPRESSION: 1. No acute intracranial pathology. 2. Mild age-related atrophy and chronic microvascular ischemic changes. Electronically Signed   By: Anner Crete M.D.   On: 03/17/2021 23:17   DG Chest Port 1 View  Result Date: 03/17/2021 CLINICAL DATA:  Altered mental status. EXAM: PORTABLE CHEST 1 VIEW COMPARISON:  Chest radiograph dated 03/16/2021. FINDINGS: Shallow inspiration. Minimal bibasilar atelectasis. No focal consolidation, pleural effusion or pneumothorax. The cardiac silhouette is within limits. No acute osseous pathology. Degenerative changes of the left shoulder. IMPRESSION: Shallow inspiration with minimal bibasilar atelectasis. Electronically Signed   By: Anner Crete M.D.   On: 03/17/2021  23:10    Procedures .Critical Care Performed by: Merryl Hacker, MD Authorized by: Merryl Hacker, MD   Critical care provider statement:    Critical care time (minutes):  60   Critical care was necessary to treat or prevent imminent or life-threatening deterioration of the following conditions:  Metabolic crisis and dehydration   Critical care was time spent personally by me on the following activities:  Development of treatment plan with patient or surrogate, discussions with consultants, evaluation of patient's response to treatment, examination of patient, ordering and review of laboratory studies, ordering and review of radiographic studies, ordering and performing treatments and interventions, pulse oximetry, re-evaluation of patient's condition and review of old charts    Medications Ordered in ED Medications  potassium chloride 10 mEq in 100 mL IVPB (10 mEq Intravenous New Bag/Given 03/18/21 0133)  polyethylene glycol (MIRALAX / GLYCOLAX) packet 17 g (17 g Oral Given 03/18/21 0135)  senna-docusate (Senokot-S) tablet 1 tablet (1 tablet Oral Given 03/18/21 0136)  0.9 % NaCl with KCl 40 mEq / L  infusion (has no administration in time range)  sodium chloride 0.9 % bolus 1,000 mL (1,000 mLs Intravenous New Bag/Given 03/18/21 0045)    ED Course/ Medical Decision Making/ A&P                           Medical Decision Making Amount and/or Complexity of Data Reviewed Labs: ordered. Radiology: ordered.  Risk Prescription drug management. Decision regarding hospitalization.   This patient presents to the ED for concern of altered mental status, this involves an extensive number of treatment options, and is a complaint that carries with it a high risk of complications and morbidity.  The differential diagnosis includes sepsis and infection, intoxication, medications, stroke  MDM:    This is a 66 year old male with history of schizophrenia with reported increased altered mental  status.  He is nontoxic on exam.  He is notably tachycardic.  He has a distended abdomen.  He is only oriented to himself.  He is quite somnolent.  Work-up initiated.  Initial vital signs with a temperature of 98.1.  Rectal temperature 96.5.  Sepsis work-up was initiated.  He has not been hypotensive.  He was given a liter of fluids.  Upon catheterization, he was noted to have 2300 cc in his bladder.  Foley catheter was placed.  Unclear etiology of acute retention.  No obvious urinary tract infection.  No leukocytosis.  He does have hyponatremia, hypokalemia with a potassium of 2.9.  Creatinine also acutely elevated at 2.69.  BUN is elevated at 35 which could be contributing to his altered mental status.  Given his abdominal distention and acute urinary retention, CT scan was obtained.  CT scan is largely unremarkable and is only notable for constipation.  Patient was hydrated and given IV potassium.  We will plan for admission for ongoing treatment.  May benefit from bowel cleanout.  Lactate normal, COVID and influenza testing negative. (Labs, imaging)  Labs: I Ordered, and personally interpreted labs.  The pertinent results include: Potassium 2.9, creatinine 2.69, sodium 128, normal lactic  Imaging Studies ordered: I ordered imaging studies including CT abdomen pelvis with constipation I independently visualized and interpreted imaging. I agree with the radiologist interpretation  Additional history obtained from family.  External records from outside source obtained and reviewed including prior visits  Critical Interventions: IV potassium, IV hydration  Consultations: I requested consultation with the hospitalist,  and discussed lab and imaging findings as well as pertinent plan - they recommend: Admit  Cardiac Monitoring: The patient was maintained on a cardiac monitor.  I personally viewed and interpreted the cardiac monitored which showed an underlying rhythm of: Sinus  tachycardia  Reevaluation: After the interventions noted above, I reevaluated the patient and found that they have :improved   Considered admission for: Acute kidney injury, hypokalemia, acute urinary retention  Social Determinants of Health: History of schizophrenia  Disposition: Admit  Co morbidities that complicate the patient evaluation  Past Medical History:  Diagnosis Date   Schizophrenia ([place])      Medicines Meds ordered this encounter  Medications   potassium chloride 10 mEq in 100 mL IVPB   sodium chloride 0.9 % bolus 1,000 mL   polyethylene glycol (MIRALAX / GLYCOLAX) packet 17 g   senna-docusate (Senokot-S) tablet 1 tablet   0.9 % NaCl with KCl 40 mEq / L  infusion    I have reviewed the patients home medicines and have made adjustments as needed  Problem List / ED Course: Problem List Items Addressed This Visit   None Visit Diagnoses     Acute urinary retention    -  Primary   AKI (acute kidney injury) ([place])       Hypokalemia       Constipation, unspecified constipation type                       Final Clinical Impression(s) / ED Diagnoses Final diagnoses:  Acute urinary retention  AKI (acute kidney injury) ([place])  Hypokalemia  Constipation, unspecified constipation type    Rx / DC Orders ED Discharge Orders     None         Dina Rich, Barbette Hair, MD 03/18/21 [phone]

## 2021-03-17 NOTE — ED Triage Notes (Signed)
Per EMS pt's family states pt has been going downhill last 8 months and today is more altered and weaker.

## 2021-03-18 ENCOUNTER — Emergency Department (HOSPITAL_COMMUNITY): Payer: Medicare Other

## 2021-03-18 DIAGNOSIS — G9341 Metabolic encephalopathy: Secondary | ICD-10-CM | POA: Diagnosis present

## 2021-03-18 DIAGNOSIS — N179 Acute kidney failure, unspecified: Principal | ICD-10-CM | POA: Diagnosis present

## 2021-03-18 DIAGNOSIS — E871 Hypo-osmolality and hyponatremia: Secondary | ICD-10-CM | POA: Diagnosis not present

## 2021-03-18 DIAGNOSIS — E876 Hypokalemia: Secondary | ICD-10-CM | POA: Diagnosis not present

## 2021-03-18 LAB — CBC WITH DIFFERENTIAL/PLATELET
Abs Immature Granulocytes: 0.05 10*3/uL (ref 0.00–0.07)
Basophils Absolute: 0 10*3/uL (ref 0.0–0.1)
Basophils Relative: 0 %
Eosinophils Absolute: 0 10*3/uL (ref 0.0–0.5)
Eosinophils Relative: 0 %
HCT: 31.8 % — ABNORMAL LOW (ref 39.0–52.0)
Hemoglobin: 10.7 g/dL — ABNORMAL LOW (ref 13.0–17.0)
Immature Granulocytes: 1 %
Lymphocytes Relative: 13 %
Lymphs Abs: 1 10*3/uL (ref 0.7–4.0)
MCH: 30.4 pg (ref 26.0–34.0)
MCHC: 33.6 g/dL (ref 30.0–36.0)
MCV: 90.3 fL (ref 80.0–100.0)
Monocytes Absolute: 0.5 10*3/uL (ref 0.1–1.0)
Monocytes Relative: 7 %
Neutro Abs: 5.8 10*3/uL (ref 1.7–7.7)
Neutrophils Relative %: 79 %
Platelets: 205 10*3/uL (ref 150–400)
RBC: 3.52 MIL/uL — ABNORMAL LOW (ref 4.22–5.81)
RDW: 13.6 % (ref 11.5–15.5)
WBC: 7.5 10*3/uL (ref 4.0–10.5)
nRBC: 0 % (ref 0.0–0.2)

## 2021-03-18 LAB — COMPREHENSIVE METABOLIC PANEL
ALT: 18 U/L (ref 0–44)
AST: 18 U/L (ref 15–41)
Albumin: 3.4 g/dL — ABNORMAL LOW (ref 3.5–5.0)
Alkaline Phosphatase: 66 U/L (ref 38–126)
Anion gap: 12 (ref 5–15)
BUN: 34 mg/dL — ABNORMAL HIGH (ref 8–23)
CO2: 19 mmol/L — ABNORMAL LOW (ref 22–32)
Calcium: 8.4 mg/dL — ABNORMAL LOW (ref 8.9–10.3)
Chloride: 102 mmol/L (ref 98–111)
Creatinine, Ser: 2.04 mg/dL — ABNORMAL HIGH (ref 0.61–1.24)
GFR, Estimated: 36 mL/min — ABNORMAL LOW (ref >60)
Glucose, Bld: 124 mg/dL — ABNORMAL HIGH (ref 70–99)
Potassium: 3.6 mmol/L (ref 3.5–5.1)
Sodium: 133 mmol/L — ABNORMAL LOW (ref 135–145)
Total Bilirubin: 1.1 mg/dL (ref 0.3–1.2)
Total Protein: 6.9 g/dL (ref 6.5–8.1)

## 2021-03-18 LAB — TSH: TSH: 4.366 u[IU]/mL (ref 0.350–4.500)

## 2021-03-18 LAB — RAPID URINE DRUG SCREEN, HOSP PERFORMED
Amphetamines: NOT DETECTED
Barbiturates: NOT DETECTED
Benzodiazepines: NOT DETECTED
Cocaine: NOT DETECTED
Opiates: NOT DETECTED
Tetrahydrocannabinol: NOT DETECTED

## 2021-03-18 LAB — LACTIC ACID, PLASMA: Lactic Acid, Venous: 1.2 mmol/L (ref 0.5–1.9)

## 2021-03-18 LAB — MAGNESIUM: Magnesium: 1.5 mg/dL — ABNORMAL LOW (ref 1.7–2.4)

## 2021-03-18 LAB — HIV ANTIBODY (ROUTINE TESTING W REFLEX): HIV Screen 4th Generation wRfx: NONREACTIVE

## 2021-03-18 MED ORDER — ACETAMINOPHEN 650 MG RE SUPP: 650.0000 mg | Freq: Four times a day (QID) | RECTAL | Status: DC | PRN

## 2021-03-18 MED ORDER — LEVOTHYROXINE SODIUM 75 MCG PO TABS
75.0000 ug | ORAL_TABLET | Freq: Every day | ORAL | Status: DC
  Administered 2021-03-18 – 2021-03-20 (×3): 75 ug via ORAL
  Filled 2021-03-18 (×3): qty 1

## 2021-03-18 MED ORDER — POTASSIUM CHLORIDE 10 MEQ/100ML IV SOLN
10.0000 meq | INTRAVENOUS | Status: AC
  Administered 2021-03-18 (×2): 10 meq via INTRAVENOUS
  Filled 2021-03-18 (×2): qty 100

## 2021-03-18 MED ORDER — CLONAZEPAM 0.5 MG PO TABS: 1.0000 mg | ORAL_TABLET | Freq: Every evening | ORAL | Status: DC | PRN

## 2021-03-18 MED ORDER — SODIUM CHLORIDE 0.9 % IV BOLUS
1000.0000 mL | Freq: Once | INTRAVENOUS | Status: AC
  Administered 2021-03-18: 01:00:00 1000 mL via INTRAVENOUS

## 2021-03-18 MED ORDER — OXYCODONE HCL 5 MG PO TABS: 5.0000 mg | ORAL_TABLET | ORAL | Status: DC | PRN

## 2021-03-18 MED ORDER — ENSURE ENLIVE PO LIQD
237.0000 mL | Freq: Two times a day (BID) | ORAL | Status: DC
  Administered 2021-03-18 – 2021-03-20 (×4): 237 mL via ORAL

## 2021-03-18 MED ORDER — ORAL CARE MOUTH RINSE
15.0000 mL | Freq: Two times a day (BID) | OROMUCOSAL | Status: DC
  Administered 2021-03-18 – 2021-03-19 (×3): 15 mL via OROMUCOSAL

## 2021-03-18 MED ORDER — HEPARIN SODIUM (PORCINE) 5000 UNIT/ML IJ SOLN
5000.0000 [IU] | Freq: Three times a day (TID) | INTRAMUSCULAR | Status: DC
  Administered 2021-03-18 – 2021-03-20 (×7): 5000 [IU] via SUBCUTANEOUS
  Filled 2021-03-18 (×8): qty 1

## 2021-03-18 MED ORDER — POTASSIUM CHLORIDE IN NACL 40-0.9 MEQ/L-% IV SOLN
INTRAVENOUS | Status: DC
  Filled 2021-03-18 (×8): qty 1000

## 2021-03-18 MED ORDER — METHOCARBAMOL 1000 MG/10ML IJ SOLN
500.0000 mg | Freq: Four times a day (QID) | INTRAVENOUS | Status: DC | PRN
  Filled 2021-03-18: qty 5

## 2021-03-18 MED ORDER — ACETAMINOPHEN 325 MG PO TABS: 650.0000 mg | ORAL_TABLET | Freq: Four times a day (QID) | ORAL | Status: DC | PRN

## 2021-03-18 MED ORDER — POLYETHYLENE GLYCOL 3350 17 G PO PACK
17.0000 g | PACK | Freq: Two times a day (BID) | ORAL | Status: DC
  Administered 2021-03-18 – 2021-03-20 (×6): 17 g via ORAL
  Filled 2021-03-18 (×6): qty 1

## 2021-03-18 MED ORDER — ONDANSETRON HCL 4 MG PO TABS: 4.0000 mg | ORAL_TABLET | Freq: Four times a day (QID) | ORAL | Status: DC | PRN

## 2021-03-18 MED ORDER — ONDANSETRON HCL 4 MG/2ML IJ SOLN
4.0000 mg | Freq: Four times a day (QID) | INTRAMUSCULAR | Status: DC | PRN
Start: 2021-03-18 — End: 2021-03-21

## 2021-03-18 MED ORDER — CYCLOBENZAPRINE HCL 10 MG PO TABS
10.0000 mg | ORAL_TABLET | Freq: Once | ORAL | Status: AC
  Administered 2021-03-18: 04:00:00 10 mg via ORAL
  Filled 2021-03-18: qty 1

## 2021-03-18 MED ORDER — SENNOSIDES-DOCUSATE SODIUM 8.6-50 MG PO TABS
1.0000 | ORAL_TABLET | Freq: Every day | ORAL | Status: DC
  Administered 2021-03-18 – 2021-03-19 (×3): 1 via ORAL
  Filled 2021-03-18 (×3): qty 1

## 2021-03-18 MED ORDER — MORPHINE SULFATE (PF) 2 MG/ML IV SOLN: 2.0000 mg | INTRAVENOUS | Status: DC | PRN

## 2021-03-18 MED ORDER — MAGNESIUM SULFATE 2 GM/50ML IV SOLN
2.0000 g | Freq: Once | INTRAVENOUS | Status: AC
  Administered 2021-03-18: 09:00:00 2 g via INTRAVENOUS
  Filled 2021-03-18: qty 50

## 2021-03-18 MED ORDER — BENZTROPINE MESYLATE 1 MG PO TABS
2.0000 mg | ORAL_TABLET | Freq: Every day | ORAL | Status: DC
  Administered 2021-03-18 – 2021-03-19 (×2): 2 mg via ORAL
  Filled 2021-03-18 (×2): qty 2

## 2021-03-18 MED ORDER — CHLORHEXIDINE GLUCONATE CLOTH 2 % EX PADS
6.0000 | MEDICATED_PAD | Freq: Every day | CUTANEOUS | Status: DC
  Administered 2021-03-18 – 2021-03-20 (×3): 6 via TOPICAL

## 2021-03-18 MED ORDER — FLUPHENAZINE HCL 10 MG PO TABS
30.0000 mg | ORAL_TABLET | Freq: Every day | ORAL | Status: DC
  Filled 2021-03-18 (×2): qty 3

## 2021-03-18 NOTE — Progress Notes (Signed)
Patient ID: [name], male   DOB: March 14, 1955, 66 y.o.   MRN: [id number] Chart reviewed.  Renal function getting better.  Replete magnesium level.  New Foley catheter for obstruction.

## 2021-03-18 NOTE — ED Notes (Signed)
Pt has severe left calf cramp. Attempted to massage cramp. Unsuccessful. Will notify hospitalist.

## 2021-03-18 NOTE — ED Notes (Signed)
Pt to ct 

## 2021-03-18 NOTE — ED Notes (Signed)
Lab called to re-run UA d/t results being inconsistent.

## 2021-03-18 NOTE — Progress Notes (Signed)
°  Transition of Care [facility]) Screening Note   Patient Details  Name: ZEKIAH CARUTH Date of Birth: Dec 18, 1955   [facility]) CM/SW Contact:    [name], LCSWA Phone Number: 03/18/2021, 11:32 AM    Transition of Care Department [facility]) has reviewed patient and no TOC needs have been identified at this time. We will continue to monitor patient advancement through interdisciplinary progression rounds. If new patient transition needs arise, please place a TOC consult.

## 2021-03-18 NOTE — H&P (Addendum)
TRH H&P    Patient Demographics:    [name], is a 66 y.o. male  MRN: [id number]  DOB - 05-12-1955  Admit Date - 03/17/2021  Referring MD/NP/PA: Wilkie AyeHorton  Outpatient Primary MD for the patient is Pcp, No  Patient coming from: Home  Chief complaint- altered mental status   HPI:    [name]  is a 66 y.o. male, with history of schizophrenia presents to the ED with a chief complaint of altered mental status.  Per EMS family reported that patient's mental status and generalized weakness have gotten much worse over the last 24 hours.  They also reported general decline over the last year.  Patient unfortunately is not able to provide much history.  Its difficult to know his baseline given history of schizophrenia.  He does know his name, where he is at, the year.  He does not know why he is here.  He does state he has a leg cramp, and otherwise just turned his head and goes back to sleep.  Patient was tachycardic with a distended abdomen on presentation.  Sepsis work-up was initiated, with no lactic acidosis, no leukocytosis, afebrile, patient was tacky but was not hypotensive.  Patient had a Foley catheter placed that returned 2300 cc of urine.  His CT abdomen pelvis showed large stool burden.  Likely constipation was a cause of urinary retention.  On lab work patient did have hyponatremia, hypokalemia, hypochloremia, an AKI creatinine 2.69 up from 0.69.  BUN is up to 35 from 7.  EKG did show sinus tachycardia 120, QTc 459.  1 L normal saline was given, and 20 mEq potassium given.  Chest x-ray showed shallow inspiration with minimal bibasilar atelectasis.  Alcohol level less than 10.  CT head showed no acute findings.  UDS negative, UA was not indicative of UTI.  Urine culture blood culture pending.  Admission was requested for further work-up and management of altered mental status, hypokalemia, and AKI.       Review of systems:    Unfortunately review of systems could not be obtained secondary to patient's altered mental status    Past History of the following :    Past Medical History:  Diagnosis Date   Schizophrenia [facility](HCC)       Past Surgical History:  Procedure Laterality Date   SHOULDER SURGERY        Social History:      Social History   Tobacco Use   Smoking status: Every Day    Packs/day: 1.00    Types: Cigarettes   Smokeless tobacco: Not on file  Substance Use Topics   Alcohol use: Yes    Comment: occasionaly       Family History :    No family history on file. Family history could not be reviewed secondary to patient's altered mental status   Home Medications:   Prior to Admission medications   Medication Sig Start Date End Date Taking? Authorizing Provider  benztropine (COGENTIN) 2 MG tablet Take 1 tablet (2 mg total) by mouth at  bedtime. 09/28/12   Thermon Leyland, NP  clonazePAM (KLONOPIN) 1 MG tablet Take 1 tablet (1 mg total) by mouth at bedtime. 09/28/12   Thermon Leyland, NP  fluPHENAZine (PROLIXIN) 10 MG tablet Take 3 tablets (30 mg total) by mouth at bedtime. For mood control. 09/28/12   Thermon Leyland, NP  hydrOXYzine (ATARAX/VISTARIL) 50 MG tablet Take 1 tablet (50 mg total) by mouth at bedtime as needed (sleep). 09/28/12   Thermon Leyland, NP  levothyroxine (SYNTHROID, LEVOTHROID) 75 MCG tablet Take 1 tablet (75 mcg total) by mouth daily. 09/28/12   Thermon Leyland, NP  omeprazole (PRILOSEC) 20 MG capsule Take 1 capsule (20 mg total) by mouth daily. 09/28/12   Thermon Leyland, NP  traZODone (DESYREL) 100 MG tablet Take 2 tablets (200 mg total) by mouth at bedtime. For sleep. 09/28/12   Thermon Leyland, NP     Allergies:    No Known Allergies   Physical Exam:   Vitals  Blood pressure 132/81, pulse (!) 106, temperature (!) 96.5 F (35.8 C), temperature source Rectal, resp. rate 18, height 5\' 8"  (1.727 m), weight 78.5 kg, SpO2 100 %.   1.  General: Patient  lying supine in bed,  no acute distress   2. Psychiatric: Somnolent and oriented x 3, flat affect, and partially cooperative with exam   3. Neurologic: Speech and language are normal, face is symmetric, moves all 4 extremities voluntarily, unknown baseline   4. HEENMT:  Head is atraumatic, normocephalic, pupils reactive to light, neck is supple, trachea is midline, mucous membranes are moist   5. Respiratory : Lungs are clear to auscultation bilaterally without wheezing, rhonchi, rales, no cyanosis, no increase in work of breathing or accessory muscle use   6. Cardiovascular : Heart rate tachycardic, rhythm is regular, no murmurs, rubs or gallops, no peripheral edema, peripheral pulses palpated   7. Gastrointestinal:  Abdomen is soft, nondistended, nontender to palpation bowel sounds active, no masses or organomegaly palpated   8. Skin:  Skin is warm, dry and intact without rashes, acute lesions, or ulcers on limited exam   9.Musculoskeletal:  Right knee brace, no acute deformities or trauma, no asymmetry in tone, no peripheral edema, peripheral pulses palpated, no tenderness to palpation in the extremities     Data Review:    CBC Recent Labs  Lab 03/17/21 2213  WBC 8.4  HGB 11.6*  HCT 34.0*  PLT 230  MCV 90.9  MCH 31.0  MCHC 34.1  RDW 13.6  LYMPHSABS 1.1  MONOABS 0.7  EOSABS 0.0  BASOSABS 0.0   ------------------------------------------------------------------------------------------------------------------  Results for orders placed or performed during the hospital encounter of 03/17/21 (from the past 48 hour(s))  CBG monitoring, ED     Status: Abnormal   Collection Time: 03/17/21 10:01 PM  Result Value Ref Range   Glucose-Capillary 130 (H) 70 - 99 mg/dL    Comment: Glucose reference range applies only to samples taken after fasting for at least 8 hours.  Comprehensive metabolic panel     Status: Abnormal   Collection Time: 03/17/21 10:13 PM  Result Value  Ref Range   Sodium 128 (L) 135 - 145 mmol/L   Potassium 2.9 (L) 3.5 - 5.1 mmol/L   Chloride 97 (L) 98 - 111 mmol/L   CO2 20 (L) 22 - 32 mmol/L   Glucose, Bld 143 (H) 70 - 99 mg/dL    Comment: Glucose reference range applies only to samples taken after fasting for at  least 8 hours.   BUN 35 (H) 8 - 23 mg/dL   Creatinine, Ser 9.60 (H) 0.61 - 1.24 mg/dL   Calcium 8.6 (L) 8.9 - 10.3 mg/dL   Total Protein 8.0 6.5 - 8.1 g/dL   Albumin 4.0 3.5 - 5.0 g/dL   AST 19 15 - 41 U/L   ALT 18 0 - 44 U/L   Alkaline Phosphatase 78 38 - 126 U/L   Total Bilirubin 1.0 0.3 - 1.2 mg/dL   GFR, Estimated 26 (L) >60 mL/min    Comment: (NOTE) Calculated using the CKD-EPI Creatinine Equation (2021)    Anion gap 11 5 - 15    Comment: Performed at [facility], [address], [place], [place] [address]  CBC WITH DIFFERENTIAL     Status: Abnormal   Collection Time: 03/17/21 10:13 PM  Result Value Ref Range   WBC 8.4 4.0 - 10.5 K/uL   RBC 3.74 (L) 4.22 - 5.81 MIL/uL   Hemoglobin 11.6 (L) 13.0 - 17.0 g/dL   HCT 81.1 (L) 91.4 - 78.2 %   MCV 90.9 80.0 - 100.0 fL   MCH 31.0 26.0 - 34.0 pg   MCHC 34.1 30.0 - 36.0 g/dL   RDW 95.6 21.3 - 08.6 %   Platelets 230 150 - 400 K/uL   nRBC 0.0 0.0 - 0.2 %   Neutrophils Relative % 78 %   Neutro Abs 6.6 1.7 - 7.7 K/uL   Lymphocytes Relative 13 %   Lymphs Abs 1.1 0.7 - 4.0 K/uL   Monocytes Relative 8 %   Monocytes Absolute 0.7 0.1 - 1.0 K/uL   Eosinophils Relative 0 %   Eosinophils Absolute 0.0 0.0 - 0.5 K/uL   Basophils Relative 0 %   Basophils Absolute 0.0 0.0 - 0.1 K/uL   Immature Granulocytes 1 %   Abs Immature Granulocytes 0.04 0.00 - 0.07 K/uL    Comment: Performed at [facility], [address], [place], [place] [address]  Ethanol     Status: None   Collection Time: 03/17/21 10:13 PM  Result Value Ref Range   Alcohol, Ethyl (B) <10 <10 mg/dL    Comment: (NOTE) Lowest detectable limit for serum alcohol is 10 mg/dL.  For medical purposes only. Performed  at [facility], [address] [place], [place] [address]   Blood culture (routine x 2)     Status: None (Preliminary result)   Collection Time: 03/17/21 10:13 PM   Specimen: Blood  Result Value Ref Range   Specimen Description RIGHT ANTECUBITAL    Special Requests      BOTTLES DRAWN AEROBIC AND ANAEROBIC Blood Culture results may not be optimal due to an excessive volume of blood received in culture bottles Performed at [facility], [address], [place], [place] [address]    Culture PENDING    Report Status PENDING   Resp Panel by RT-PCR (Flu A&B, Covid) Nasopharyngeal Swab     Status: None   Collection Time: 03/17/21 10:18 PM   Specimen: Nasopharyngeal Swab; Nasopharyngeal(NP) swabs in vial transport medium  Result Value Ref Range   SARS Coronavirus 2 by RT PCR NEGATIVE NEGATIVE    Comment: (NOTE) SARS-CoV-2 target nucleic acids are NOT DETECTED.  The SARS-CoV-2 RNA is generally detectable in upper respiratory specimens during the acute phase of infection. The lowest concentration of SARS-CoV-2 viral copies this assay can detect is 138 copies/mL. A negative result does not preclude SARS-Cov-2 infection and should not be used as the sole basis for  treatment or other patient management decisions. A negative result may occur with  improper specimen collection/handling, submission of specimen other than nasopharyngeal swab, presence of viral mutation(s) within the areas targeted by this assay, and inadequate number of viral copies(<138 copies/mL). A negative result must be combined with clinical observations, patient history, and epidemiological information. The expected result is Negative.  Fact Sheet for Patients:  BloggerCourse.comhttps://www.fda.gov/media/152166/download  Fact Sheet for Healthcare Providers:  SeriousBroker.ithttps://www.fda.gov/media/152162/download  This test is no t yet approved or cleared by the [place] FDA and  has been authorized for detection and/or diagnosis of SARS-CoV-2  by FDA under an Emergency Use Authorization (EUA). This EUA will remain  in effect (meaning this test can be used) for the duration of the COVID-19 declaration under Section 564(b)(1) of the Act, 21 U.S.C.section 360bbb-3(b)(1), unless the authorization is terminated  or revoked sooner.       Influenza A by PCR NEGATIVE NEGATIVE   Influenza B by PCR NEGATIVE NEGATIVE    Comment: (NOTE) The Xpert Xpress SARS-CoV-2/FLU/RSV plus assay is intended as an aid in the diagnosis of influenza from Nasopharyngeal swab specimens and should not be used as a sole basis for treatment. Nasal washings and aspirates are unacceptable for Xpert Xpress SARS-CoV-2/FLU/RSV testing.  Fact Sheet for Patients: BloggerCourse.comhttps://www.fda.gov/media/152166/download  Fact Sheet for Healthcare Providers: SeriousBroker.ithttps://www.fda.gov/media/152162/download  This test is not yet approved or cleared by the [place] FDA and has been authorized for detection and/or diagnosis of SARS-CoV-2 by FDA under an Emergency Use Authorization (EUA). This EUA will remain in effect (meaning this test can be used) for the duration of the COVID-19 declaration under Section 564(b)(1) of the Act, 21 U.S.C. section 360bbb-3(b)(1), unless the authorization is terminated or revoked.  Performed at [facility], [address] [place], [place] [address]   Urinalysis, Routine w reflex microscopic     Status: None   Collection Time: 03/17/21 10:35 PM  Result Value Ref Range   Color, Urine YELLOW YELLOW   APPearance CLEAR CLEAR   Specific Gravity, Urine 1.011 1.005 - 1.030   pH 6.0 5.0 - 8.0   Glucose, UA NEGATIVE NEGATIVE mg/dL   Hgb urine dipstick NEGATIVE NEGATIVE   Bilirubin Urine NEGATIVE NEGATIVE   Ketones, ur NEGATIVE NEGATIVE mg/dL   Protein, ur NEGATIVE NEGATIVE mg/dL   Nitrite NEGATIVE NEGATIVE   Leukocytes,Ua NEGATIVE NEGATIVE    Comment: Performed at [facility], [address] [place], [place] [address]  Ammonia     Status: None    Collection Time: 03/17/21 11:34 PM  Result Value Ref Range   Ammonia 14 9 - 35 umol/L    Comment: Performed at [facility], [address] [place], [place] [address]  Blood culture (routine x 2)     Status: None (Preliminary result)   Collection Time: 03/17/21 11:34 PM   Specimen: Right Antecubital; Blood  Result Value Ref Range   Specimen Description RIGHT ANTECUBITAL    Special Requests      BOTTLES DRAWN AEROBIC AND ANAEROBIC Blood Culture adequate volume Performed at [facility], [address] [place], [place] [address]    Culture PENDING    Report Status PENDING   Lactic acid, plasma     Status: None   Collection Time: 03/17/21 11:34 PM  Result Value Ref Range   Lactic Acid, Venous 1.0 0.5 - 1.9 mmol/L    Comment: Performed at [facility], [address] [place], [place] [address]  Urine rapid drug screen (hosp performed)  Status: None   Collection Time: 03/17/21 11:35 PM  Result Value Ref Range   Opiates NONE DETECTED NONE DETECTED   Cocaine NONE DETECTED NONE DETECTED   Benzodiazepines NONE DETECTED NONE DETECTED   Amphetamines NONE DETECTED NONE DETECTED   Tetrahydrocannabinol NONE DETECTED NONE DETECTED   Barbiturates NONE DETECTED NONE DETECTED    Comment: (NOTE) DRUG SCREEN FOR MEDICAL PURPOSES ONLY.  IF CONFIRMATION IS NEEDED FOR ANY PURPOSE, NOTIFY LAB WITHIN 5 DAYS.  LOWEST DETECTABLE LIMITS FOR URINE DRUG SCREEN Drug Class                     Cutoff (ng/mL) Amphetamine and metabolites    1000 Barbiturate and metabolites    200 Benzodiazepine                 200 Tricyclics and metabolites     300 Opiates and metabolites        300 Cocaine and metabolites        300 THC                            50 Performed at [facility], [address], [place], [place] [address]   Lactic acid, plasma     Status: None   Collection Time: 03/18/21  1:41 AM  Result Value Ref Range   Lactic Acid, Venous 1.2 0.5 - 1.9 mmol/L    Comment: Performed at [facility], [address], [place], [place] [address]    Chemistries  Recent Labs  Lab 03/17/21 2213  NA 128*  K 2.9*  CL 97*  CO2 20*  GLUCOSE 143*  BUN 35*  CREATININE 2.69*  CALCIUM 8.6*  AST 19  ALT 18  ALKPHOS 78  BILITOT 1.0   ------------------------------------------------------------------------------------------------------------------  ------------------------------------------------------------------------------------------------------------------ GFR: Estimated Creatinine Clearance: 26.8 mL/min (A) (by C-G formula based on SCr of 2.69 mg/dL (H)). Liver Function Tests: Recent Labs  Lab 03/17/21 2213  AST 19  ALT 18  ALKPHOS 78  BILITOT 1.0  PROT 8.0  ALBUMIN 4.0   No results for input(s): LIPASE, AMYLASE in the last 168 hours. Recent Labs  Lab 03/17/21 2334  AMMONIA 14   Coagulation Profile: No results for input(s): INR, PROTIME in the last 168 hours. Cardiac Enzymes: No results for input(s): CKTOTAL, CKMB, CKMBINDEX, TROPONINI in the last 168 hours. BNP (last 3 results) No results for input(s): PROBNP in the last 8760 hours. HbA1C: No results for input(s): HGBA1C in the last 72 hours. CBG: Recent Labs  Lab 03/17/21 2201  GLUCAP 130*   Lipid Profile: No results for input(s): CHOL, HDL, LDLCALC, TRIG, CHOLHDL, LDLDIRECT in the last 72 hours. Thyroid Function Tests: No results for input(s): TSH, T4TOTAL, FREET4, T3FREE, THYROIDAB in the last 72 hours. Anemia Panel: No results for input(s): VITAMINB12, FOLATE, FERRITIN, TIBC, IRON, RETICCTPCT in the last 72 hours.  --------------------------------------------------------------------------------------------------------------- Urine analysis:    Component Value Date/Time   COLORURINE YELLOW 03/17/2021 2235   APPEARANCEUR CLEAR 03/17/2021 2235   LABSPEC 1.011 03/17/2021 2235   PHURINE 6.0 03/17/2021 2235   GLUCOSEU NEGATIVE 03/17/2021 2235   HGBUR NEGATIVE 03/17/2021 2235   BILIRUBINUR NEGATIVE  03/17/2021 2235   KETONESUR NEGATIVE 03/17/2021 2235   PROTEINUR NEGATIVE 03/17/2021 2235   UROBILINOGEN 1.0 09/21/2012 0530   NITRITE NEGATIVE 03/17/2021 2235   LEUKOCYTESUR NEGATIVE 03/17/2021 2235      Imaging Results:    CT ABDOMEN PELVIS WO CONTRAST  Result  Date: 03/18/2021 CLINICAL DATA:  Abdominal pain EXAM: CT ABDOMEN AND PELVIS WITHOUT CONTRAST TECHNIQUE: Multidetector CT imaging of the abdomen and pelvis was performed following the standard protocol without IV contrast. RADIATION DOSE REDUCTION: This exam was performed according to the departmental dose-optimization program which includes automated exposure control, adjustment of the mA and/or kV according to patient size and/or use of iterative reconstruction technique. COMPARISON:  None. FINDINGS: Lower chest: Scarring in the lung bases.  No acute abnormality. Hepatobiliary: Probable small layering gallstone within the gallbladder. No suspicious focal hepatic abnormality or biliary ductal dilatation. Pancreas: Atrophy.  No focal abnormality or ductal dilatation. Spleen: No focal abnormality.  Normal size. Adrenals/Urinary Tract: Foley catheter within the bladder which is decompressed. No stones or hydronephrosis. No renal or adrenal mass. Stomach/Bowel: Large stool burden throughout the colon. No evidence of bowel obstruction. Stomach and small bowel grossly unremarkable. Vascular/Lymphatic: No evidence of aneurysm or adenopathy. Reproductive: Fall No visible focal abnormality. Other: No free fluid or free air. Musculoskeletal: No acute bony abnormality. IMPRESSION: Large stool burden throughout the colon. No acute findings. Electronically Signed   By: Charlett Nose M.D.   On: 03/18/2021 00:29   CT HEAD WO CONTRAST  Result Date: 03/17/2021 CLINICAL DATA:  Altered mental status. EXAM: CT HEAD WITHOUT CONTRAST TECHNIQUE: Contiguous axial images were obtained from the base of the skull through the vertex without intravenous contrast.  RADIATION DOSE REDUCTION: This exam was performed according to the departmental dose-optimization program which includes automated exposure control, adjustment of the mA and/or kV according to patient size and/or use of iterative reconstruction technique. COMPARISON:  Head CT dated 03/16/2021. FINDINGS: Brain: Mild age-related atrophy and chronic microvascular ischemic changes. There is no acute intracranial hemorrhage. No mass effect or midline shift. No extra-axial fluid collection. Vascular: No hyperdense vessel or unexpected calcification. Skull: Normal. Negative for fracture or focal lesion. Sinuses/Orbits: No acute finding. Other: None IMPRESSION: 1. No acute intracranial pathology. 2. Mild age-related atrophy and chronic microvascular ischemic changes. Electronically Signed   By: Elgie Collard M.D.   On: 03/17/2021 23:17   DG Chest Port 1 View  Result Date: 03/17/2021 CLINICAL DATA:  Altered mental status. EXAM: PORTABLE CHEST 1 VIEW COMPARISON:  Chest radiograph dated 03/16/2021. FINDINGS: Shallow inspiration. Minimal bibasilar atelectasis. No focal consolidation, pleural effusion or pneumothorax. The cardiac silhouette is within limits. No acute osseous pathology. Degenerative changes of the left shoulder. IMPRESSION: Shallow inspiration with minimal bibasilar atelectasis. Electronically Signed   By: Elgie Collard M.D.   On: 03/17/2021 23:10       Assessment & Plan:    Principal Problem:   Acute metabolic encephalopathy Active Problems:   Hypokalemia   AKI (acute kidney injury) (HCC)   Hyponatremia   Acute metabolic encephalopathy Somnolent, family reported increasing generalized weakness CT head shows nothing acute BUN slightly elevated 35 status post renal AKI treated as below Ammonia level normal at 14 Sepsis work-up did not reveal Replace electrolytes as indicated Sedating medications Atarax, trazodone Continue to monitor Hypokalemia Potassium 2.9 Related to renal  pathology and poor p.o. intake and patient has had declining health over the last year Replace Check magnesium Recheck potassium in a.m. Hyponatremia Likely related to poor p.o. intake Continue IV hydration 1 L normal saline given in the ED, continuing maintenance fluids Recheck in a.m. AKI Postrenal with urinary retention, Foley catheter draining 2300 cc Foley in place, continue to hydrate Thyroid disease Check TSH especially given metabolic encephalopathy Schizophrenia Fluphenazine Continue as needed Klonopin Constipation  Large stool burden on CT Likely because of urinary retention MiraLAX twice daily Senna daily at bedtime   DVT Prophylaxis-   Heparin - SCDs   AM Labs Ordered, also please review Full Orders  Family Communication: No family at bedside  Code Status: Full  Admission status: Observation Disposition: Anticipated Discharge date 24-48 hours discharge to home  Time spent in minutes : 65   Aysel Gilchrest B Zierle-Ghosh DO

## 2021-03-18 NOTE — ED Notes (Signed)
Pt is alert to person, place- knows he is at hospital but thinks it is [place], and year. He also knows biden is president.

## 2021-03-19 DIAGNOSIS — F17210 Nicotine dependence, cigarettes, uncomplicated: Secondary | ICD-10-CM | POA: Diagnosis present

## 2021-03-19 DIAGNOSIS — F209 Schizophrenia, unspecified: Secondary | ICD-10-CM | POA: Diagnosis present

## 2021-03-19 DIAGNOSIS — R4182 Altered mental status, unspecified: Secondary | ICD-10-CM | POA: Diagnosis present

## 2021-03-19 DIAGNOSIS — Z79899 Other long term (current) drug therapy: Secondary | ICD-10-CM | POA: Diagnosis not present

## 2021-03-19 DIAGNOSIS — R339 Retention of urine, unspecified: Secondary | ICD-10-CM | POA: Diagnosis present

## 2021-03-19 DIAGNOSIS — N179 Acute kidney failure, unspecified: Secondary | ICD-10-CM | POA: Diagnosis present

## 2021-03-19 DIAGNOSIS — Z20822 Contact with and (suspected) exposure to covid-19: Secondary | ICD-10-CM | POA: Diagnosis present

## 2021-03-19 DIAGNOSIS — G9341 Metabolic encephalopathy: Secondary | ICD-10-CM | POA: Diagnosis not present

## 2021-03-19 DIAGNOSIS — E876 Hypokalemia: Secondary | ICD-10-CM | POA: Diagnosis present

## 2021-03-19 DIAGNOSIS — E079 Disorder of thyroid, unspecified: Secondary | ICD-10-CM | POA: Diagnosis present

## 2021-03-19 DIAGNOSIS — E878 Other disorders of electrolyte and fluid balance, not elsewhere classified: Secondary | ICD-10-CM | POA: Diagnosis present

## 2021-03-19 DIAGNOSIS — K5900 Constipation, unspecified: Secondary | ICD-10-CM | POA: Diagnosis present

## 2021-03-19 DIAGNOSIS — E871 Hypo-osmolality and hyponatremia: Secondary | ICD-10-CM | POA: Diagnosis present

## 2021-03-19 DIAGNOSIS — Z79890 Hormone replacement therapy (postmenopausal): Secondary | ICD-10-CM | POA: Diagnosis not present

## 2021-03-19 LAB — CBC WITH DIFFERENTIAL/PLATELET
Abs Immature Granulocytes: 0.04 10*3/uL (ref 0.00–0.07)
Basophils Absolute: 0 10*3/uL (ref 0.0–0.1)
Basophils Relative: 1 %
Eosinophils Absolute: 0.1 10*3/uL (ref 0.0–0.5)
Eosinophils Relative: 2 %
HCT: 31.7 % — ABNORMAL LOW (ref 39.0–52.0)
Hemoglobin: 10.8 g/dL — ABNORMAL LOW (ref 13.0–17.0)
Immature Granulocytes: 1 %
Lymphocytes Relative: 22 %
Lymphs Abs: 1.3 10*3/uL (ref 0.7–4.0)
MCH: 31.2 pg (ref 26.0–34.0)
MCHC: 34.1 g/dL (ref 30.0–36.0)
MCV: 91.6 fL (ref 80.0–100.0)
Monocytes Absolute: 0.4 10*3/uL (ref 0.1–1.0)
Monocytes Relative: 7 %
Neutro Abs: 4.1 10*3/uL (ref 1.7–7.7)
Neutrophils Relative %: 67 %
Platelets: 214 10*3/uL (ref 150–400)
RBC: 3.46 MIL/uL — ABNORMAL LOW (ref 4.22–5.81)
RDW: 13.9 % (ref 11.5–15.5)
WBC: 5.9 10*3/uL (ref 4.0–10.5)
nRBC: 0 % (ref 0.0–0.2)

## 2021-03-19 LAB — BASIC METABOLIC PANEL
Anion gap: 8 (ref 5–15)
BUN: 16 mg/dL (ref 8–23)
CO2: 24 mmol/L (ref 22–32)
Calcium: 8.8 mg/dL — ABNORMAL LOW (ref 8.9–10.3)
Chloride: 107 mmol/L (ref 98–111)
Creatinine, Ser: 0.98 mg/dL (ref 0.61–1.24)
GFR, Estimated: >60 mL/min (ref >60)
Glucose, Bld: 135 mg/dL — ABNORMAL HIGH (ref 70–99)
Potassium: 5.1 mmol/L (ref 3.5–5.1)
Sodium: 139 mmol/L (ref 135–145)

## 2021-03-19 LAB — URINE CULTURE: Culture: NO GROWTH

## 2021-03-19 MED ORDER — FLEET ENEMA 7-19 GM/118ML RE ENEM: 1.0000 | ENEMA | Freq: Every day | RECTAL | Status: DC | PRN

## 2021-03-19 MED ORDER — TAMSULOSIN HCL 0.4 MG PO CAPS
0.4000 mg | ORAL_CAPSULE | Freq: Every day | ORAL | Status: DC
  Administered 2021-03-19 – 2021-03-20 (×2): 0.4 mg via ORAL
  Filled 2021-03-19 (×2): qty 1

## 2021-03-19 NOTE — Plan of Care (Signed)
°  Problem: Acute Rehab PT Goals(only PT should resolve) Goal: Pt Will Go Supine/Side To Sit Outcome: Progressing Flowsheets (Taken 03/19/2021 1436) Pt will go Supine/Side to Sit:  with modified independence  with supervision Goal: Patient Will Transfer Sit To/From Stand Outcome: Progressing Flowsheets (Taken 03/19/2021 1436) Patient will transfer sit to/from stand: with supervision Goal: Pt Will Transfer Bed To Chair/Chair To Bed Outcome: Progressing Flowsheets (Taken 03/19/2021 1436) Pt will Transfer Bed to Chair/Chair to Bed: with supervision Goal: Pt Will Ambulate Outcome: Progressing Flowsheets (Taken 03/19/2021 1436) Pt will Ambulate:  50 feet  with supervision  with min guard assist  with rolling walker   2:37 PM, 03/19/21 Lonell Grandchild, MPT Physical Therapist with [facility] [phone] office [phone] mobile phone

## 2021-03-19 NOTE — TOC Initial Note (Signed)
Transition of Care [facility]) - Initial/Assessment Note    Patient Details  Name: [name] MRN: [id number] Date of Birth: 01/01/1956  Transition of Care [facility]) CM/SW Contact:    [name], LCSWA Phone Number: 03/19/2021, 2:54 PM  Clinical Narrative:                 CSW spoke to [name] pts sister to complete assessment. Pt lives with [name] and his wife. Pt is independent in completing his ADLs. Pts nephew provides all transportation for pt. Pt has not had HH services. CSW spoke with [name] about PT recommending HH and she states she thinks pt would benefit from having an RN and PT in the home if possible. Pt has a walker to use as needed with ambulation. TOC to follow.   Expected Discharge Plan: Home w Home Health Services Barriers to Discharge: Continued Medical Work up   Patient Goals and CMS Choice Patient states their goals for this hospitalization and ongoing recovery are:: Home with [facility] CMS Medicare.gov Compare Post Acute Care list provided to:: Patient Represenative (must comment) Choice offered to / list presented to : Sibling  Expected Discharge Plan and Services Expected Discharge Plan: Home w Home Health Services In-house Referral: Clinical Social Work Discharge Planning Services: CM Consult Post Acute Care Choice: Home Health Living arrangements for the past 2 months: Apartment                                      Prior Living Arrangements/Services Living arrangements for the past 2 months: Apartment Lives with:: Relatives Patient language and need for interpreter reviewed:: Yes Do you feel safe going back to the place where you live?: Yes      Need for Family Participation in Patient Care: Yes (Comment) Care giver support system in place?: Yes (comment) Current home services: DME Criminal Activity/Legal Involvement Pertinent to Current Situation/Hospitalization: No - Comment as needed  Activities of Daily Living Home Assistive  Devices/Equipment: None ADL Screening (condition at time of admission) Patient's cognitive ability adequate to safely complete daily activities?: Yes Is the patient deaf or have difficulty hearing?: No Does the patient have difficulty seeing, even when wearing glasses/contacts?: No Does the patient have difficulty concentrating, remembering, or making decisions?: Yes Patient able to express need for assistance with ADLs?: Yes Does the patient have difficulty dressing or bathing?: No Independently performs ADLs?: No Communication: Independent Dressing (OT): Needs assistance Is this a change from baseline?: Pre-admission baseline Grooming: Needs assistance Is this a change from baseline?: Pre-admission baseline Feeding: Independent Bathing: Needs assistance Is this a change from baseline?: Pre-admission baseline In/Out Bed: Needs assistance Is this a change from baseline?: Pre-admission baseline Walks in Home: Needs assistance Is this a change from baseline?: Pre-admission baseline Does the patient have difficulty walking or climbing stairs?: Yes Weakness of Legs: Both Weakness of Arms/Hands: Both  Permission Sought/Granted                  Emotional Assessment Appearance:: Appears stated age       Alcohol / Substance Use: Not Applicable Psych Involvement: No (comment)  Admission diagnosis:  Hypokalemia [E87.6] AKI (acute kidney injury) (HCC) [N17.9] Acute urinary retention [R33.8] Constipation, unspecified constipation type [K59.00] Acute metabolic encephalopathy [G93.41] Patient Active Problem List   Diagnosis Date Noted   Acute metabolic encephalopathy 03/18/2021   Hypokalemia 03/18/2021   AKI (acute kidney  injury) (HCC) 03/18/2021   Hyponatremia 03/18/2021   Paranoid schizophrenia, chronic condition (HCC) 09/22/2012   PCP:  Pcp, No Pharmacy:   [facility] - [place], [place] - [address] [place] [address] Phone: [phone] Fax:  [phone]     Social Determinants of Health (SDOH) Interventions    Readmission Risk Interventions No flowsheet data found.

## 2021-03-19 NOTE — Plan of Care (Signed)

## 2021-03-19 NOTE — Progress Notes (Signed)
Verified need for catheter. Family spoke of patient having a wound that needed to be packed. On first round, assessed patient and he had a small 1.5 inch by .5 cm incision to right side, mid way up. There was a small gauze and tape applied. Removed, yellow drainage to site. Patient tolerated cleansing. Left wound open to air at thsi time. There is no depth to pack.

## 2021-03-19 NOTE — Progress Notes (Signed)
Patient assisted back to bed from the chair using the walker. Placed small mepilex to site on his right side.

## 2021-03-19 NOTE — Evaluation (Signed)
Physical Therapy Evaluation Patient Details Name: KALANI STHILAIRE MRN: [id number] DOB: 05-21-1955 Today's Date: 03/19/2021  History of Present Illness  [name]  is a 66 y.o. male, with history of schizophrenia presents to the ED with a chief complaint of altered mental status.  Per EMS family reported that patient's mental status and generalized weakness have gotten much worse over the last 24 hours.  They also reported general decline over the last year.  Patient unfortunately is not able to provide much history.  Its difficult to know his baseline given history of schizophrenia.  He does know his name, where he is at, the year.  He does not know why he is here.  He does state he has a leg cramp, and otherwise just turned his head and goes back to sleep.  Patient was tachycardic with a distended abdomen on presentation.  Sepsis work-up was initiated, with no lactic acidosis, no leukocytosis, afebrile, patient was tacky but was not hypotensive.  Patient had a Foley catheter placed that returned 2300 cc of urine.  His CT abdomen pelvis showed large stool burden.  Likely constipation was a cause of urinary retention.  On lab work patient did have hyponatremia, hypokalemia, hypochloremia, an AKI creatinine 2.69 up from 0.69.  BUN is up to 35 from 7.  EKG did show sinus tachycardia 120, QTc 459.  1 L normal saline was given, and 20 mEq potassium given.  Chest x-ray showed shallow inspiration with minimal bibasilar atelectasis.  Alcohol level less than 10.  CT head showed no acute findings.  UDS negative, UA was not indicative of UTI.  Urine culture blood culture pending.  Admission was requested for further work-up and management of altered mental status, hypokalemia, and AKI.   Clinical Impression  Patient apprehensive to participate with c/o fatigue after walking to bathroom with nursing staff, but agreeable after encouragement.  Patient demonstrates slightly labored movement for sitting up at bedside,  transferring to chair and able to ambulate to doorway and back using RW without loss of balance.  Patient tolerated sitting up in chair after therapy.  Patient will benefit from continued skilled physical therapy in hospital and recommended venue below to increase strength, balance, endurance for safe ADLs and gait.         Recommendations for follow up therapy are one component of a multi-disciplinary discharge planning process, led by the attending physician.  Recommendations may be updated based on patient status, additional functional criteria and insurance authorization.  Follow Up Recommendations Home health PT    Assistance Recommended at Discharge Intermittent Supervision/Assistance  Patient can return home with the following  A little help with walking and/or transfers;A little help with bathing/dressing/bathroom;Assistance with cooking/housework;Help with stairs or ramp for entrance    Equipment Recommendations None recommended by PT  Recommendations for Other Services       Functional Status Assessment Patient has had a recent decline in their functional status and demonstrates the ability to make significant improvements in function in a reasonable and predictable amount of time.     Precautions / Restrictions Precautions Precautions: Fall Restrictions Weight Bearing Restrictions: No      Mobility  Bed Mobility Overal bed mobility: Needs Assistance Bed Mobility: Supine to Sit     Supine to sit: Min assist     General bed mobility comments: increased time, labored movement    Transfers Overall transfer level: Needs assistance Equipment used: Rolling walker (2 wheels) Transfers: Sit to/from Stand, Bed to chair/wheelchair/BSC Sit  to Stand: Min guard   Step pivot transfers: Min guard       General transfer comment: slightly labored movement, increased time    Ambulation/Gait Ambulation/Gait assistance: Min guard Gait Distance (Feet): 22 Feet Assistive  device: Rolling walker (2 wheels) Gait Pattern/deviations: Decreased step length - right, Decreased step length - left, Decreased stride length Gait velocity: decreased     General Gait Details: slow slightly labored cadence without loss of balance, limited mostly due to fatigue  Stairs            Wheelchair Mobility    Modified Rankin (Stroke Patients Only)       Balance Overall balance assessment: Needs assistance Sitting-balance support: Feet supported, No upper extremity supported Sitting balance-Leahy Scale: Good Sitting balance - Comments: seated at EOB   Standing balance support: Bilateral upper extremity supported, During functional activity Standing balance-Leahy Scale: Fair Standing balance comment: fair/good using RW                             Pertinent Vitals/Pain Pain Assessment Pain Assessment: No/denies pain    Home Living Family/patient expects to be discharged to:: Private residence Living Arrangements: Non-relatives/Friends Available Help at Discharge: Friend(s);Available PRN/intermittently Type of Home: Apartment Home Access: Stairs to enter Entrance Stairs-Rails: Right;Left;Can reach both Entrance Stairs-Number of Steps: 12   Home Layout: One level Home Equipment: Agricultural consultant (2 wheels);Cane - single point      Prior Function Prior Level of Function : Needs assist             Mobility Comments: household ambulator leaning on walls, furniture ADLs Comments: assisted by friend     Hand Dominance        Extremity/Trunk Assessment   Upper Extremity Assessment Upper Extremity Assessment: Generalized weakness    Lower Extremity Assessment Lower Extremity Assessment: Generalized weakness    Cervical / Trunk Assessment Cervical / Trunk Assessment: Normal  Communication   Communication: Expressive difficulties;Other (comment) (difficult to understand)  Cognition Arousal/Alertness: Awake/alert Behavior During  Therapy: WFL for tasks assessed/performed Overall Cognitive Status: No family/caregiver present to determine baseline cognitive functioning                                          General Comments      Exercises     Assessment/Plan    PT Assessment Patient needs continued PT services  PT Problem List Decreased strength;Decreased activity tolerance;Decreased balance;Decreased mobility       PT Treatment Interventions DME instruction;Gait training;Stair training;Functional mobility training;Therapeutic activities;Therapeutic exercise;Patient/family education;Balance training    PT Goals (Current goals can be found in the Care Plan section)  Acute Rehab PT Goals Patient Stated Goal: return home with friend to assist PT Goal Formulation: With patient Time For Goal Achievement: 03/23/21 Potential to Achieve Goals: Good    Frequency Min 3X/week     Co-evaluation               AM-PAC PT "6 Clicks" Mobility  Outcome Measure Help needed turning from your back to your side while in a flat bed without using bedrails?: None Help needed moving from lying on your back to sitting on the side of a flat bed without using bedrails?: A Little Help needed moving to and from a bed to a chair (including a wheelchair)?: A Little Help needed  standing up from a chair using your arms (e.g., wheelchair or bedside chair)?: A Little Help needed to walk in hospital room?: A Little Help needed climbing 3-5 steps with a railing? : A Lot 6 Click Score: 18    End of Session   Activity Tolerance: Patient tolerated treatment well;Patient limited by fatigue Patient left: in chair;with call bell/phone within reach Nurse Communication: Mobility status PT Visit Diagnosis: Unsteadiness on feet (R26.81);Other abnormalities of gait and mobility (R26.89);Muscle weakness (generalized) (M62.81)    Time: [id number] PT Time Calculation (min) (ACUTE ONLY): 20 min   Charges:   PT  Evaluation $PT Eval Moderate Complexity: 1 Mod PT Treatments $Therapeutic Activity: 8-22 mins        2:35 PM, 03/19/21 Ocie BobJames Caiden Monsivais, MPT Physical Therapist with [facility] 336 [phone] office [phone] mobile phone

## 2021-03-19 NOTE — Progress Notes (Signed)
Patient ID: MARTIN CHAUHAN, male   DOB: [birth year], 66 y.o.   MRN: [id number]                                                                               HPI:    Zakhary Elena  is a 66 y.o. male, with history of schizophrenia presents to the ED with a chief complaint of altered mental status.  Per EMS family reported that patient's mental status and generalized weakness have gotten much worse over the last 24 hours.  They also reported general decline over the last year.  Patient unfortunately is not able to provide much history.  Its difficult to know his baseline given history of schizophrenia.  He does know his name, where he is at, the year.  He does not know why he is here.  He does state he has a leg cramp, and otherwise just turned his head and goes back to sleep.  Patient was tachycardic with a distended abdomen on presentation.  Sepsis work-up was initiated, with no lactic acidosis, no leukocytosis, afebrile, patient was tacky but was not hypotensive.  Patient had a Foley catheter placed that returned 2300 cc of urine.  His CT abdomen pelvis showed large stool burden.  Likely constipation was a cause of urinary retention.  On lab work patient did have hyponatremia, hypokalemia, hypochloremia, an AKI creatinine 2.69 up from 0.69.  BUN is up to 35 from 7.  EKG did show sinus tachycardia 120, QTc 459.  1 L normal saline was given, and 20 mEq potassium given.  Chest x-ray showed shallow inspiration with minimal bibasilar atelectasis.  Alcohol level less than 10.  CT head showed no acute findings.  UDS negative, UA was not indicative of UTI.  Urine culture blood culture pending.  Admission was requested for further work-up and management of altered mental status, hypokalemia, and AKI.  Subjective Patient reports he is compliant with his home medication and he lives with friends.  He reports he is feeling better.  He reports he is very constipated   Physical Exam:   Vitals  Blood pressure (!)  151/88, pulse (!) 107, temperature 98.2 F (36.8 C), temperature source Oral, resp. rate 18, height 6\' 1"  (1.854 m), weight 88.1 kg, SpO2 97 %.   1.  General: Patient lying supine in bed,  no acute distress   2. Psychiatric: Somnolent and oriented x 3, flat affect, and partially cooperative with exam   3. Neurologic: Speech and language are normal, face is symmetric, moves all 4 extremities voluntarily, unknown baseline   4. HEENMT:  Head is atraumatic, normocephalic, pupils reactive to light, neck is supple, trachea is midline, mucous membranes are moist   5. Respiratory : Lungs are clear to auscultation bilaterally without wheezing, rhonchi, rales, no cyanosis, no increase in work of breathing or accessory muscle use   6. Cardiovascular : Heart rate tachycardic, rhythm is regular, no murmurs, rubs or gallops, no peripheral edema, peripheral pulses palpated   7. Gastrointestinal:  Abdomen is soft, nondistended, nontender to palpation bowel sounds active, no masses or organomegaly palpated   8. Skin:  Skin is warm, dry and intact without rashes, acute lesions, or  ulcers on limited exam   9.Musculoskeletal:  no acute deformities or trauma, no asymmetry in tone, no peripheral edema, peripheral pulses palpated, no tenderness to palpation in the extremities     Data Review:    CBC Recent Labs  Lab 03/17/21 2213 03/18/21 0610 03/19/21 0605  WBC 8.4 7.5 5.9  HGB 11.6* 10.7* 10.8*  HCT 34.0* 31.8* 31.7*  PLT 230 205 214  MCV 90.9 90.3 91.6  MCH 31.0 30.4 31.2  MCHC 34.1 33.6 34.1  RDW 13.6 13.6 13.9  LYMPHSABS 1.1 1.0 1.3  MONOABS 0.7 0.5 0.4  EOSABS 0.0 0.0 0.1  BASOSABS 0.0 0.0 0.0   ------------------------------------------------------------------------------------------------------------------  Results for orders placed or performed during the hospital encounter of 03/17/21 (from the past 48 hour(s))  CBG monitoring, ED     Status: Abnormal   Collection Time:  03/17/21 10:01 PM  Result Value Ref Range   Glucose-Capillary 130 (H) 70 - 99 mg/dL    Comment: Glucose reference range applies only to samples taken after fasting for at least 8 hours.  Comprehensive metabolic panel     Status: Abnormal   Collection Time: 03/17/21 10:13 PM  Result Value Ref Range   Sodium 128 (L) 135 - 145 mmol/L   Potassium 2.9 (L) 3.5 - 5.1 mmol/L   Chloride 97 (L) 98 - 111 mmol/L   CO2 20 (L) 22 - 32 mmol/L   Glucose, Bld 143 (H) 70 - 99 mg/dL    Comment: Glucose reference range applies only to samples taken after fasting for at least 8 hours.   BUN 35 (H) 8 - 23 mg/dL   Creatinine, Ser 2.69 (H) 0.61 - 1.24 mg/dL   Calcium 8.6 (L) 8.9 - 10.3 mg/dL   Total Protein 8.0 6.5 - 8.1 g/dL   Albumin 4.0 3.5 - 5.0 g/dL   AST 19 15 - 41 U/L   ALT 18 0 - 44 U/L   Alkaline Phosphatase 78 38 - 126 U/L   Total Bilirubin 1.0 0.3 - 1.2 mg/dL   GFR, Estimated 26 (L) >60 mL/min    Comment: (NOTE) Calculated using the CKD-EPI Creatinine Equation (2021)    Anion gap 11 5 - 15    Comment: Performed at [facility], [address], [place], [place] [address]  CBC WITH DIFFERENTIAL     Status: Abnormal   Collection Time: 03/17/21 10:13 PM  Result Value Ref [place]   WBC 8.4 4.0 - 10.5 K/uL   RBC 3.74 (L) 4.22 - 5.81 MIL/uL   Hemoglobin 11.6 (L) 13.0 - 17.0 g/dL   HCT 34.0 (L) 39.0 - 52.0 %   MCV 90.9 80.0 - 100.0 fL   MCH 31.0 26.0 - 34.0 pg   MCHC 34.1 30.0 - 36.0 g/dL   RDW 13.6 11.5 - 15.5 %   Platelets 230 150 - 400 K/uL   nRBC 0.0 0.0 - 0.2 %   Neutrophils Relative % 78 %   Neutro Abs 6.6 1.7 - 7.7 K/uL   Lymphocytes Relative 13 %   Lymphs Abs 1.1 0.7 - 4.0 K/uL   Monocytes Relative 8 %   Monocytes Absolute 0.7 0.1 - 1.0 K/uL   Eosinophils Relative 0 %   Eosinophils Absolute 0.0 0.0 - 0.5 K/uL   Basophils Relative 0 %   Basophils Absolute 0.0 0.0 - 0.1 K/uL   Immature Granulocytes 1 %   Abs Immature Granulocytes 0.04 0.00 - 0.07 K/uL    Comment: Performed at [facility], [address] [place], [place] [address]  Ethanol     Status: None   Collection Time: 03/17/21 10:13 PM  Result Value Ref Range   Alcohol, Ethyl (B) <10 <10 mg/dL    Comment: (NOTE) Lowest detectable limit for serum alcohol is 10 mg/dL.  For medical purposes only. Performed at [facility], [address], [place], [place] [address]   Blood culture (routine x 2)     Status: None (Preliminary result)   Collection Time: 03/17/21 10:13 PM   Specimen: Right Antecubital; Blood  Result Value Ref Range   Specimen Description RIGHT ANTECUBITAL    Special Requests      BOTTLES DRAWN AEROBIC AND ANAEROBIC Blood Culture results may not be optimal due to an excessive volume of blood received in culture bottles   Culture      NO GROWTH 2 DAYS Performed at [facility], [address], [place], [place] [address]    Report Status PENDING   Resp Panel by RT-PCR (Flu A&B, Covid) Nasopharyngeal Swab     Status: None   Collection Time: 03/17/21 10:18 PM   Specimen: Nasopharyngeal Swab; Nasopharyngeal(NP) swabs in vial transport medium  Result Value Ref Range   SARS Coronavirus 2 by RT PCR NEGATIVE NEGATIVE    Comment: (NOTE) SARS-CoV-2 target nucleic acids are NOT DETECTED.  The SARS-CoV-2 RNA is generally detectable in upper respiratory specimens during the acute phase of infection. The lowest concentration of SARS-CoV-2 viral copies this assay can detect is 138 copies/mL. A negative result does not preclude SARS-Cov-2 infection and should not be used as the sole basis for treatment or other patient management decisions. A negative result may occur with  improper specimen collection/handling, submission of specimen other than nasopharyngeal swab, presence of viral mutation(s) within the areas targeted by this assay, and inadequate number of viral copies(<138 copies/mL). A negative result must be combined with clinical observations, patient history, and epidemiological information. The  expected result is Negative.  Fact Sheet for Patients:  EntrepreneurPulse.com.au  Fact Sheet for Healthcare Providers:  IncredibleEmployment.be  This test is no t yet approved or cleared by the [place] FDA and  has been authorized for detection and/or diagnosis of SARS-CoV-2 by FDA under an Emergency Use Authorization (EUA). This EUA will remain  in effect (meaning this test can be used) for the duration of the COVID-19 declaration under Section 564(b)(1) of the Act, 21 U.S.C.section 360bbb-3(b)(1), unless the authorization is terminated  or revoked sooner.       Influenza A by PCR NEGATIVE NEGATIVE   Influenza B by PCR NEGATIVE NEGATIVE    Comment: (NOTE) The Xpert Xpress SARS-CoV-2/FLU/RSV plus assay is intended as an aid in the diagnosis of influenza from Nasopharyngeal swab specimens and should not be used as a sole basis for treatment. Nasal washings and aspirates are unacceptable for Xpert Xpress SARS-CoV-2/FLU/RSV testing.  Fact Sheet for Patients: EntrepreneurPulse.com.au  Fact Sheet for Healthcare Providers: IncredibleEmployment.be  This test is not yet approved or cleared by the [place] FDA and has been authorized for detection and/or diagnosis of SARS-CoV-2 by FDA under an Emergency Use Authorization (EUA). This EUA will remain in effect (meaning this test can be used) for the duration of the COVID-19 declaration under Section 564(b)(1) of the Act, 21 U.S.C. section 360bbb-3(b)(1), unless the authorization is terminated or revoked.  Performed at [facility], [address] [place], [place] [address]   Urinalysis, Routine w reflex microscopic     Status: None   Collection Time:  03/17/21 10:35 PM  Result Value Ref Range   Color, Urine YELLOW YELLOW   APPearance CLEAR CLEAR   Specific Gravity, Urine 1.011 1.005 - 1.030   pH 6.0 5.0 - 8.0   Glucose, UA NEGATIVE NEGATIVE mg/dL    Hgb urine dipstick NEGATIVE NEGATIVE   Bilirubin Urine NEGATIVE NEGATIVE   Ketones, ur NEGATIVE NEGATIVE mg/dL   Protein, ur NEGATIVE NEGATIVE mg/dL   Nitrite NEGATIVE NEGATIVE   Leukocytes,Ua NEGATIVE NEGATIVE    Comment: Performed at [facility], [address] [place], [place] [address]  Urine Culture     Status: None   Collection Time: 03/17/21 10:35 PM   Specimen: Urine, Clean Catch  Result Value Ref Range   Specimen Description      URINE, CLEAN CATCH Performed at [facility], [address] [place], [place] [address]    Special Requests      NONE Performed at [facility], [address] [place], [place] [address]    Culture      NO GROWTH Performed at [facility], [place] [address] [place], [place] [address]    Report Status 03/19/2021 FINAL   Ammonia     Status: None   Collection Time: 03/17/21 11:34 PM  Result Value Ref Range   Ammonia 14 9 - 35 umol/L    Comment: Performed at [facility], [address] [place], [place] [address]  Blood culture (routine x 2)     Status: None (Preliminary result)   Collection Time: 03/17/21 11:34 PM   Specimen: Right Antecubital; Blood  Result Value Ref Range   Specimen Description RIGHT ANTECUBITAL    Special Requests      BOTTLES DRAWN AEROBIC AND ANAEROBIC Blood Culture adequate volume   Culture      NO GROWTH 2 DAYS Performed at [facility], [address] [place], [place] [address]    Report Status PENDING   Lactic acid, plasma     Status: None   Collection Time: 03/17/21 11:34 PM  Result Value Ref Range   Lactic Acid, Venous 1.0 0.5 - 1.9 mmol/L    Comment: Performed at [facility], [address], [place],  [address]  Urine rapid drug screen (hosp performed)     Status: None   Collection Time: 03/17/21 11:35 PM  Result Value Ref Range   Opiates NONE DETECTED NONE DETECTED   Cocaine NONE DETECTED NONE DETECTED   Benzodiazepines NONE DETECTED NONE DETECTED   Amphetamines NONE DETECTED NONE DETECTED    Tetrahydrocannabinol NONE DETECTED NONE DETECTED   Barbiturates NONE DETECTED NONE DETECTED    Comment: (NOTE) DRUG SCREEN FOR MEDICAL PURPOSES ONLY.  IF CONFIRMATION IS NEEDED FOR ANY PURPOSE, NOTIFY LAB WITHIN 5 DAYS.  LOWEST DETECTABLE LIMITS FOR URINE DRUG SCREEN Drug Class                     Cutoff (ng/mL) Amphetamine and metabolites    1000 Barbiturate and metabolites    200 Benzodiazepine                 [id number] Tricyclics and metabolites     300 Opiates and metabolites        300 Cocaine and metabolites        300 THC                            50 Performed at [facility], [address], [place],  [place] [address]   Lactic acid, plasma     Status: None   Collection Time: 03/18/21  1:41 AM  Result Value Ref Range   Lactic Acid, Venous 1.2 0.5 - 1.9 mmol/L    Comment: Performed at [facility], [address], [place], [place] [address]  HIV Antibody (routine testing w rflx)     Status: None   Collection Time: 03/18/21  6:10 AM  Result Value Ref Range   HIV Screen 4th Generation wRfx Non Reactive Non Reactive    Comment: Performed at [facility], [place] [address], [place], [place] [address]  Comprehensive metabolic panel     Status: Abnormal   Collection Time: 03/18/21  6:10 AM  Result Value Ref Range   Sodium 133 (L) 135 - 145 mmol/L   Potassium 3.6 3.5 - 5.1 mmol/L    Comment: DELTA CHECK NOTED   Chloride 102 98 - 111 mmol/L   CO2 19 (L) 22 - 32 mmol/L   Glucose, Bld 124 (H) 70 - 99 mg/dL    Comment: Glucose reference range applies only to samples taken after fasting for at least 8 hours.   BUN 34 (H) 8 - 23 mg/dL   Creatinine, Ser 2.04 (H) 0.61 - 1.24 mg/dL   Calcium 8.4 (L) 8.9 - 10.3 mg/dL   Total Protein 6.9 6.5 - 8.1 g/dL   Albumin 3.4 (L) 3.5 - 5.0 g/dL   AST 18 15 - 41 U/L   ALT 18 0 - 44 U/L   Alkaline Phosphatase 66 38 - 126 U/L   Total Bilirubin 1.1 0.3 - 1.2 mg/dL   GFR, Estimated 36 (L) >60 mL/min    Comment: (NOTE) Calculated using the CKD-EPI  Creatinine Equation (2021)    Anion gap 12 5 - 15    Comment: Performed at [facility], [address], [place], [place] [address]  Magnesium     Status: Abnormal   Collection Time: 03/18/21  6:10 AM  Result Value Ref Range   Magnesium 1.5 (L) 1.7 - 2.4 mg/dL    Comment: Performed at [facility], [address], [place], [place] [address]  CBC WITH DIFFERENTIAL     Status: Abnormal   Collection Time: 03/18/21  6:10 AM  Result Value Ref Range   WBC 7.5 4.0 - 10.5 K/uL   RBC 3.52 (L) 4.22 - 5.81 MIL/uL   Hemoglobin 10.7 (L) 13.0 - 17.0 g/dL   HCT 31.8 (L) 39.0 - 52.0 %   MCV 90.3 80.0 - 100.0 fL   MCH 30.4 26.0 - 34.0 pg   MCHC 33.6 30.0 - 36.0 g/dL   RDW 13.6 11.5 - 15.5 %   Platelets 205 150 - 400 K/uL   nRBC 0.0 0.0 - 0.2 %   Neutrophils Relative % 79 %   Neutro Abs 5.8 1.7 - 7.7 K/uL   Lymphocytes Relative 13 %   Lymphs Abs 1.0 0.7 - 4.0 K/uL   Monocytes Relative 7 %   Monocytes Absolute 0.5 0.1 - 1.0 K/uL   Eosinophils Relative 0 %   Eosinophils Absolute 0.0 0.0 - 0.5 K/uL   Basophils Relative 0 %   Basophils Absolute 0.0 0.0 - 0.1 K/uL   Immature Granulocytes 1 %   Abs Immature Granulocytes 0.05 0.00 - 0.07 K/uL    Comment: Performed at [facility], [address], [place], [place] [address]  TSH     Status: None   Collection Time: 03/18/21  6:11 AM  Result Value  Ref Range   TSH 4.366 0.350 - 4.500 uIU/mL    Comment: Performed by a 3rd Generation assay with a functional sensitivity of <=0.01 uIU/mL. Performed at [facility], [address], [place], [place] [address]   CBC with Differential/Platelet     Status: Abnormal   Collection Time: 03/19/21  6:05 AM  Result Value Ref Range   WBC 5.9 4.0 - 10.5 K/uL   RBC 3.46 (L) 4.22 - 5.81 MIL/uL   Hemoglobin 10.8 (L) 13.0 - 17.0 g/dL   HCT 31.7 (L) 39.0 - 52.0 %   MCV 91.6 80.0 - 100.0 fL   MCH 31.2 26.0 - 34.0 pg   MCHC 34.1 30.0 - 36.0 g/dL   RDW 13.9 11.5 - 15.5 %   Platelets 214 150 - 400 K/uL   nRBC 0.0 0.0 -  0.2 %   Neutrophils Relative % 67 %   Neutro Abs 4.1 1.7 - 7.7 K/uL   Lymphocytes Relative 22 %   Lymphs Abs 1.3 0.7 - 4.0 K/uL   Monocytes Relative 7 %   Monocytes Absolute 0.4 0.1 - 1.0 K/uL   Eosinophils Relative 2 %   Eosinophils Absolute 0.1 0.0 - 0.5 K/uL   Basophils Relative 1 %   Basophils Absolute 0.0 0.0 - 0.1 K/uL   Immature Granulocytes 1 %   Abs Immature Granulocytes 0.04 0.00 - 0.07 K/uL    Comment: Performed at [facility], [address] [place], [place] [id number]  Basic metabolic panel     Status: Abnormal   Collection Time: 03/19/21  6:05 AM  Result Value Ref Range   Sodium 139 135 - 145 mmol/L   Potassium 5.1 3.5 - 5.1 mmol/L    Comment: DELTA CHECK NOTED   Chloride 107 98 - 111 mmol/L   CO2 24 22 - 32 mmol/L   Glucose, Bld 135 (H) 70 - 99 mg/dL    Comment: Glucose reference range applies only to samples taken after fasting for at least 8 hours.   BUN 16 8 - 23 mg/dL   Creatinine, Ser 0.98 0.61 - 1.24 mg/dL    Comment: DELTA CHECK NOTED   Calcium 8.8 (L) 8.9 - 10.3 mg/dL   GFR, Estimated >60 >60 mL/min    Comment: (NOTE) Calculated using the CKD-EPI Creatinine Equation (2021)    Anion gap 8 5 - 15    Comment: Performed at [facility], [address], [place], [place] [address]    Chemistries  Recent Labs  Lab 03/17/21 2213 03/18/21 0610 03/19/21 0605  NA 128* 133* 139  K 2.9* 3.6 5.1  CL 97* 102 107  CO2 20* 19* 24  GLUCOSE 143* 124* 135*  BUN 35* 34* 16  CREATININE 2.69* 2.04* 0.98  CALCIUM 8.6* 8.4* 8.8*  MG  --  1.5*  --   AST 19 18  --   ALT 18 18  --   ALKPHOS 78 66  --   BILITOT 1.0 1.1  --    ------------------------------------------------------------------------------------------------------------------  ------------------------------------------------------------------------------------------------------------------ GFR: Estimated Creatinine Clearance: 86.1 mL/min (by C-G formula based on SCr of 0.98 mg/dL). Liver Function  Tests: Recent Labs  Lab 03/17/21 2213 03/18/21 0610  AST 19 18  ALT 18 18  ALKPHOS 78 66  BILITOT 1.0 1.1  PROT 8.0 6.9  ALBUMIN 4.0 3.4*   No results for input(s): LIPASE, AMYLASE in the last 168 hours. Recent Labs  Lab 03/17/21 2334  AMMONIA 14   Coagulation Profile: No results for input(s): INR, PROTIME  in the last 168 hours. Cardiac Enzymes: No results for input(s): CKTOTAL, CKMB, CKMBINDEX, TROPONINI in the last 168 hours. BNP (last 3 results) No results for input(s): PROBNP in the last 8760 hours. HbA1C: No results for input(s): HGBA1C in the last 72 hours. CBG: Recent Labs  Lab 03/17/21 2201  GLUCAP 130*   Lipid Profile: No results for input(s): CHOL, HDL, LDLCALC, TRIG, CHOLHDL, LDLDIRECT in the last 72 hours. Thyroid Function Tests: Recent Labs    03/18/21 0611  TSH 4.366   Anemia Panel: No results for input(s): VITAMINB12, FOLATE, FERRITIN, TIBC, IRON, RETICCTPCT in the last 72 hours.  --------------------------------------------------------------------------------------------------------------- Urine analysis:    Component Value Date/Time   COLORURINE YELLOW 03/17/2021 2235   APPEARANCEUR CLEAR 03/17/2021 2235   LABSPEC 1.011 03/17/2021 2235   PHURINE 6.0 03/17/2021 2235   GLUCOSEU NEGATIVE 03/17/2021 2235   HGBUR NEGATIVE 03/17/2021 2235   BILIRUBINUR NEGATIVE 03/17/2021 2235   KETONESUR NEGATIVE 03/17/2021 2235   PROTEINUR NEGATIVE 03/17/2021 2235   UROBILINOGEN 1.0 09/21/2012 0530   NITRITE NEGATIVE 03/17/2021 2235   LEUKOCYTESUR NEGATIVE 03/17/2021 2235      Imaging Results:    CT ABDOMEN PELVIS WO CONTRAST  Result Date: 03/18/2021 CLINICAL DATA:  Abdominal pain EXAM: CT ABDOMEN AND PELVIS WITHOUT CONTRAST TECHNIQUE: Multidetector CT imaging of the abdomen and pelvis was performed following the standard protocol without IV contrast. RADIATION DOSE REDUCTION: This exam was performed according to the departmental dose-optimization program  which includes automated exposure control, adjustment of the mA and/or kV according to patient size and/or use of iterative reconstruction technique. COMPARISON:  None. FINDINGS: Lower chest: Scarring in the lung bases.  No acute abnormality. Hepatobiliary: Probable small layering gallstone within the gallbladder. No suspicious focal hepatic abnormality or biliary ductal dilatation. Pancreas: Atrophy.  No focal abnormality or ductal dilatation. Spleen: No focal abnormality.  Normal size. Adrenals/Urinary Tract: Foley catheter within the bladder which is decompressed. No stones or hydronephrosis. No renal or adrenal mass. Stomach/Bowel: Large stool burden throughout the colon. No evidence of bowel obstruction. Stomach and small bowel grossly unremarkable. Vascular/Lymphatic: No evidence of aneurysm or adenopathy. Reproductive: Fall No visible focal abnormality. Other: No free fluid or free air. Musculoskeletal: No acute bony abnormality. IMPRESSION: Large stool burden throughout the colon. No acute findings. Electronically Signed   By: Rolm Baptise M.D.   On: 03/18/2021 00:29   CT HEAD WO CONTRAST  Result Date: 03/17/2021 CLINICAL DATA:  Altered mental status. EXAM: CT HEAD WITHOUT CONTRAST TECHNIQUE: Contiguous axial images were obtained from the base of the skull through the vertex without intravenous contrast. RADIATION DOSE REDUCTION: This exam was performed according to the departmental dose-optimization program which includes automated exposure control, adjustment of the mA and/or kV according to patient size and/or use of iterative reconstruction technique. COMPARISON:  Head CT dated 03/16/2021. FINDINGS: Brain: Mild age-related atrophy and chronic microvascular ischemic changes. There is no acute intracranial hemorrhage. No mass effect or midline shift. No extra-axial fluid collection. Vascular: No hyperdense vessel or unexpected calcification. Skull: Normal. Negative for fracture or focal lesion.  Sinuses/Orbits: No acute finding. Other: None IMPRESSION: 1. No acute intracranial pathology. 2. Mild age-related atrophy and chronic microvascular ischemic changes. Electronically Signed   By: Anner Crete M.D.   On: 03/17/2021 23:17   DG Chest Port 1 View  Result Date: 03/17/2021 CLINICAL DATA:  Altered mental status. EXAM: PORTABLE CHEST 1 VIEW COMPARISON:  Chest radiograph dated 03/16/2021. FINDINGS: Shallow inspiration. Minimal bibasilar atelectasis. No focal consolidation, pleural  effusion or pneumothorax. The cardiac silhouette is within limits. No acute osseous pathology. Degenerative changes of the left shoulder. IMPRESSION: Shallow inspiration with minimal bibasilar atelectasis. Electronically Signed   By: Anner Crete M.D.   On: 03/17/2021 23:10       Assessment & Plan:    Principal Problem:   Acute metabolic encephalopathy Active Problems:   Hypokalemia   AKI (acute kidney injury) ([place])   Hyponatremia   Acute metabolic encephalopathy Resolved encephalopathy  CT head shows nothing acute BUN slightly elevated 35 status post renal AKI treated as below Ammonia level normal at 14 Sepsis work-up did not reveal Replace electrolytes as indicated Sedating medications Atarax, trazodone Continue to monitor Hypokalemia Potassium 2.9 Related to renal pathology and poor p.o. intake and patient has had declining health over the last year Replace Check magnesium Recheck potassium in a.m. Hyponatremia Likely related to poor p.o. intake Continue IV hydration 1 L normal saline given in the ED, continuing maintenance fluids Recheck in a.m. AKI Postrenal with urinary retention, Foley catheter draining 2300 cc Foley in place, continue to hydrate Thyroid disease Check TSH especially given metabolic encephalopathy Schizophrenia Fluphenazine Continue as needed Klonopin Constipation Large stool burden on CT Likely because of urinary retention MiraLAX twice daily Senna  daily at bedtime Fleets enema today Bladder training after constipation addressed successfully   DVT Prophylaxis-   Heparin - SCDs   AM Labs Ordered, also please review Full Orders  Family Communication: No family at bedside  Code Status: Full   Geordan Xu A

## 2021-03-20 LAB — BASIC METABOLIC PANEL
Anion gap: 13 (ref 5–15)
BUN: 12 mg/dL (ref 8–23)
CO2: 26 mmol/L (ref 22–32)
Calcium: 9.5 mg/dL (ref 8.9–10.3)
Chloride: 99 mmol/L (ref 98–111)
Creatinine, Ser: 0.83 mg/dL (ref 0.61–1.24)
GFR, Estimated: >60 mL/min (ref >60)
Glucose, Bld: 142 mg/dL — ABNORMAL HIGH (ref 70–99)
Potassium: 5.2 mmol/L — ABNORMAL HIGH (ref 3.5–5.1)
Sodium: 138 mmol/L (ref 135–145)

## 2021-03-20 LAB — CBC WITH DIFFERENTIAL/PLATELET
Abs Immature Granulocytes: 0.06 10*3/uL (ref 0.00–0.07)
Basophils Absolute: 0 10*3/uL (ref 0.0–0.1)
Basophils Relative: 1 %
Eosinophils Absolute: 0 10*3/uL (ref 0.0–0.5)
Eosinophils Relative: 1 %
HCT: 35.2 % — ABNORMAL LOW (ref 39.0–52.0)
Hemoglobin: 11.7 g/dL — ABNORMAL LOW (ref 13.0–17.0)
Immature Granulocytes: 1 %
Lymphocytes Relative: 21 %
Lymphs Abs: 1.3 10*3/uL (ref 0.7–4.0)
MCH: 31 pg (ref 26.0–34.0)
MCHC: 33.2 g/dL (ref 30.0–36.0)
MCV: 93.1 fL (ref 80.0–100.0)
Monocytes Absolute: 0.4 10*3/uL (ref 0.1–1.0)
Monocytes Relative: 7 %
Neutro Abs: 4.4 10*3/uL (ref 1.7–7.7)
Neutrophils Relative %: 69 %
Platelets: 251 10*3/uL (ref 150–400)
RBC: 3.78 MIL/uL — ABNORMAL LOW (ref 4.22–5.81)
RDW: 13.8 % (ref 11.5–15.5)
WBC: 6.3 10*3/uL (ref 4.0–10.5)
nRBC: 0 % (ref 0.0–0.2)

## 2021-03-20 MED ORDER — SENNOSIDES-DOCUSATE SODIUM 8.6-50 MG PO TABS: 1.0000 | ORAL_TABLET | Freq: Every evening | ORAL | 0 refills | Status: DC | PRN

## 2021-03-20 MED ORDER — POLYETHYLENE GLYCOL 3350 17 G PO PACK: 17.0000 g | PACK | Freq: Two times a day (BID) | ORAL | 0 refills | Status: AC

## 2021-03-20 MED ORDER — TAMSULOSIN HCL 0.4 MG PO CAPS: 0.4000 mg | ORAL_CAPSULE | Freq: Every day | ORAL | 0 refills | Status: DC

## 2021-03-20 NOTE — Progress Notes (Signed)
Initial Nutrition Assessment  DOCUMENTATION CODES:  Not applicable  INTERVENTION:  Continue current diet as ordered, encourage PO intake Continue Ensure Enlive po BID, each supplement provides 350 kcal and 20 grams of protein MVI with minerals daily  NUTRITION DIAGNOSIS:  Inadequate oral intake related to decreased appetite as evidenced by per patient/family report.  GOAL:  Patient will meet greater than or equal to 90% of their needs  MONITOR:  PO intake, Supplement acceptance  REASON FOR ASSESSMENT:  Malnutrition Screening Tool    ASSESSMENT:  66 year old male with history of schizophrenia presented to ED from home with AMS and weakness.  Pt found to have altered electrolytes and AKI on admission. Pt also found to have large stool burden with urinary retention. Noted to be a poor historian.  Pt stable to be dc, but currently on voiding trial after foley removed. Overall fair intake this admission but family reports poor PO pta and general decline. Ensure supplements in place for pt.  Limited weight hx available, unable to determine if loss has occurred.   Average Meal Intake: 1/24-1/27: 63% average intake x 6 recorded meals  Nutritionally Relevant Medications: Scheduled Meds:  feeding supplement  237 mL Oral BID BM   levothyroxine  75 mcg Oral Daily   polyethylene glycol  17 g Oral BID   senna-docusate  1 tablet Oral QHS   PRN Meds: ondansetron, sodium phosphate  Labs Reviewed: Potassium 5.2 Mg 1.5   NUTRITION - FOCUSED PHYSICAL EXAM: Defer to in-person assessment  Diet Order:   Diet Order             Diet - low sodium heart healthy           Diet Heart Room service appropriate? Yes; Fluid consistency: Thin  Diet effective now                   EDUCATION NEEDS:  Not appropriate for education at this time  Skin:  Skin Assessment: Reviewed RN Assessment  Last BM:  1/27 - type 3  Height:  Ht Readings from Last 1 Encounters:  03/18/21 6\' 1"   (1.854 m)   Weight:  Wt Readings from Last 1 Encounters:  03/18/21 88.1 kg    Ideal Body Weight:  83.6 kg  BMI:  Body mass index is 25.62 kg/m.  Estimated Nutritional Needs:  Kcal:  2200-2400 kcal/d Protein:  110-120 g/d Fluid:  >2.2L/d  03/20/21, RD, LDN Clinical Dietitian RD pager # available in [facility]  After hours/weekend pager # available in [facility]

## 2021-03-20 NOTE — Progress Notes (Signed)
Patient's catheter removed this morning, upon several attempts to void patient was unable to and bladder scan showed greater than of urine. Coude catheter inserted per Dr. Jarrett Ables and patient will discharge home with catheter with the plan to follow up with outpatient urology. Patient's sister, Bobak Oguinn has been made aware of this and information provided on AVS for urology office.

## 2021-03-20 NOTE — Progress Notes (Signed)
Physical Therapy Treatment Patient Details Name: [name] MRN: [id number] DOB: 1956-01-10 Today's Date: 03/20/2021   History of Present Illness [name]  is a 66 y.o. male, with history of schizophrenia presents to the ED with a chief complaint of altered mental status.  Per EMS family reported that patient's mental status and generalized weakness have gotten much worse over the last 24 hours.  They also reported general decline over the last year.  Patient unfortunately is not able to provide much history.  Its difficult to know his baseline given history of schizophrenia.  He does know his name, where he is at, the year.  He does not know why he is here.  He does state he has a leg cramp, and otherwise just turned his head and goes back to sleep.  Patient was tachycardic with a distended abdomen on presentation.  Sepsis work-up was initiated, with no lactic acidosis, no leukocytosis, afebrile, patient was tacky but was not hypotensive.  Patient had a Foley catheter placed that returned 2300 cc of urine.  His CT abdomen pelvis showed large stool burden.  Likely constipation was a cause of urinary retention.  On lab work patient did have hyponatremia, hypokalemia, hypochloremia, an AKI creatinine 2.69 up from 0.69.  BUN is up to 35 from 7.  EKG did show sinus tachycardia 120, QTc 459.  1 L normal saline was given, and 20 mEq potassium given.  Chest x-ray showed shallow inspiration with minimal bibasilar atelectasis.  Alcohol level less than 10.  CT head showed no acute findings.  UDS negative, UA was not indicative of UTI.  Urine culture blood culture pending.  Admission was requested for further work-up and management of altered mental status, hypokalemia, and AKI.    PT Comments    Patient requires increased time and min guard to min assist with all mobility today. He demonstrates good sitting balance EOB while completing exercises. He transfers to standing and ambulates with RW and assist.  Patient ends session seated in chair. Patient will benefit from continued skilled physical therapy in hospital and recommended venue below to increase strength, balance, endurance for safe ADLs and gait.   Recommendations for follow up therapy are one component of a multi-disciplinary discharge planning process, led by the attending physician.  Recommendations may be updated based on patient status, additional functional criteria and insurance authorization.  Follow Up Recommendations  Home health PT     Assistance Recommended at Discharge Intermittent Supervision/Assistance  Patient can return home with the following A little help with walking and/or transfers;A little help with bathing/dressing/bathroom;Assistance with cooking/housework;Help with stairs or ramp for entrance   Equipment Recommendations  None recommended by PT    Recommendations for Other Services       Precautions / Restrictions Precautions Precautions: Fall Restrictions Weight Bearing Restrictions: No     Mobility  Bed Mobility Overal bed mobility: Needs Assistance Bed Mobility: Supine to Sit     Supine to sit: Min guard     General bed mobility comments: increased time, labored movement    Transfers Overall transfer level: Needs assistance Equipment used: Rolling walker (2 wheels)   Sit to Stand: Min guard, Min assist Stand pivot transfers: Min guard Step pivot transfers: Min guard       General transfer comment: slightly labored movement, increased time    Ambulation/Gait Ambulation/Gait assistance: Min guard Gait Distance (Feet): 4 Feet Assistive device: Rolling walker (2 wheels) Gait Pattern/deviations: Decreased step length - right, Decreased step  length - left, Decreased stride length Gait velocity: decreased     General Gait Details: slow slightly labored cadence without loss of balance, limited mostly due to fatigue, unsteady   Stairs             Wheelchair Mobility     Modified Rankin (Stroke Patients Only)       Balance Overall balance assessment: Needs assistance Sitting-balance support: Feet supported, No upper extremity supported Sitting balance-Leahy Scale: Good Sitting balance - Comments: seated at EOB   Standing balance support: Bilateral upper extremity supported, During functional activity Standing balance-Leahy Scale: Fair Standing balance comment: fair using RW                            Cognition Arousal/Alertness: Awake/alert Behavior During Therapy: WFL for tasks assessed/performed Overall Cognitive Status: No family/caregiver present to determine baseline cognitive functioning                                          Exercises General Exercises - Lower Extremity Ankle Circles/Pumps: AROM, Both, 10 reps, Seated Long Arc Quad: AROM, Both, 10 reps, Seated Heel Slides: AROM, Both, 10 reps, Seated Hip Flexion/Marching: AROM, Both, 10 reps, Seated    General Comments        Pertinent Vitals/Pain Pain Assessment Pain Assessment: No/denies pain    Home Living                          Prior Function            PT Goals (current goals can now be found in the care plan section) Acute Rehab PT Goals Patient Stated Goal: return home with friend to assist PT Goal Formulation: With patient Time For Goal Achievement: 03/23/21 Potential to Achieve Goals: Good Progress towards PT goals: Progressing toward goals    Frequency    Min 3X/week      PT Plan Current plan remains appropriate    Co-evaluation              AM-PAC PT "6 Clicks" Mobility   Outcome Measure  Help needed turning from your back to your side while in a flat bed without using bedrails?: None Help needed moving from lying on your back to sitting on the side of a flat bed without using bedrails?: A Little Help needed moving to and from a bed to a chair (including a wheelchair)?: A Little Help needed  standing up from a chair using your arms (e.g., wheelchair or bedside chair)?: A Little Help needed to walk in hospital room?: A Little Help needed climbing 3-5 steps with a railing? : A Lot 6 Click Score: 18    End of Session   Activity Tolerance: Patient tolerated treatment well;Patient limited by fatigue Patient left: in chair;with call bell/phone within reach Nurse Communication: Mobility status PT Visit Diagnosis: Unsteadiness on feet (R26.81);Other abnormalities of gait and mobility (R26.89);Muscle weakness (generalized) (M62.81)     Time: 5638-7564 PT Time Calculation (min) (ACUTE ONLY): 9 min  Charges:  $Therapeutic Exercise: 8-22 mins                     11:25 AM, 03/20/21 Wyman Songster PT, DPT Physical Therapist at [facility]

## 2021-03-20 NOTE — Discharge Summary (Addendum)
Physician Discharge Summary  Patient ID: [name] MRN: [id number] DOB/AGE: 28-Oct-1955 66 y.o.  Admit date: 03/17/2021 Discharge date: 03/20/2021  Admission Diagnoses:  Discharge Diagnoses:  Principal Problem:   Acute metabolic encephalopathy Active Problems:   Hypokalemia   AKI (acute kidney injury) (HCC)   Hyponatremia   Discharged Condition: stable  Hospital Course: [name]  is a 66 y.o. male, with history of schizophrenia presents to the ED with a chief complaint of altered mental status.  Per EMS family reported that patient's mental status and generalized weakness have gotten much worse over the last 24 hours.  They also reported general decline over the last year.  Patient unfortunately is not able to provide much history.  Its difficult to know his baseline given history of schizophrenia.  He does know his name, where he is at, the year.  He does not know why he is here.  He does state he has a leg cramp, and otherwise just turned his head and goes back to sleep.  Patient was tachycardic with a distended abdomen on presentation.  Sepsis work-up was initiated, with no lactic acidosis, no leukocytosis, afebrile, patient was tacky but was not hypotensive.  Patient had a Foley catheter placed that returned 2300 cc of urine.  His CT abdomen pelvis showed large stool burden.  Likely constipation was a cause of urinary retention.  On lab work patient did have hyponatremia, hypokalemia, hypochloremia, an AKI creatinine 2.69 up from 0.69.  BUN is up to 35 from 7.  EKG did show sinus tachycardia 120, QTc 459.  1 L normal saline was given, and 20 mEq potassium given.  Chest x-ray showed shallow inspiration with minimal bibasilar atelectasis.  Alcohol level less than 10.  CT head showed no acute findings.  UDS negative, UA was not indicative of UTI.  Urine culture blood culture pending.  Admission was requested for further work-up and management of altered mental status, hypokalemia, and  AKI.  Acute metabolic encephalopathy Resolved encephalopathy  CT head shows nothing acute BUN slightly elevated 35 status post renal AKI treated as below Ammonia level normal at 14 Sepsis work-up did not reveal Replace electrolytes as indicated Sedating medications Atarax, trazodone held Continue to monitor Hypokalemia Potassium 2.9 Related to renal pathology and poor p.o. intake and patient has had declining health over the last year Replace magnesium repleted Hyponatremia Likely related to poor p.o. intake Continue IV hydration 1 L normal saline given in the ED, continuing maintenance fluids Improved AKI due to urinary retention likely from his constipation Postrenal with urinary retention, Foley catheter draining 2300 cc Foley discharged prior to going home and was able to urinate on his own.  Suspect likely secondary to constipation  Schizophrenia Fluphenazine Continue as needed Klonopin Constipation Large stool burden on CT Likely because of urinary retention MiraLAX twice daily Senna daily at bedtime Fleets enema today with successful bowel movement   At follow-up on primary care physician I would reassess his BMP to make sure his creatinine continues to be normal patient has been started on Flomax.  Would also check a mag level to make sure that is okay.  He did get physical therapy evaluation here who recommended home health physical therapy which has been set up.  Patient is to follow-up with primary care physician in 1 to 2 weeks.      Discharge Exam: Blood pressure (!) 146/77, pulse 99, temperature 98.1 F (36.7 C), resp. rate 19, height 6\' 1"  (1.854 m), weight 88.1  kg, SpO2 100 %. General appearance: alert and cooperative Head: Normocephalic, without obvious abnormality Resp: clear to auscultation bilaterally Cardio: regular rate and rhythm, S1, S2 normal, no murmur, click, rub or gallop Extremities: extremities normal, atraumatic, no cyanosis or  edema Pulses: 2+ and symmetric Skin: Skin color, texture, turgor normal. No rashes or lesions  Disposition: Discharge disposition: 01-Home or Self Care       Discharge Instructions     Diet - low sodium heart healthy   Complete by: As directed    Discharge instructions   Complete by: As directed    Follow-up with primary care physician in 1 week   Increase activity slowly   Complete by: As directed    No wound care   Complete by: As directed       Allergies as of 03/20/2021   No Known Allergies      Medication List     STOP taking these medications    fluPHENAZine 10 MG tablet Commonly known as: PROLIXIN   hydrOXYzine 50 MG tablet Commonly known as: ATARAX       TAKE these medications    benztropine 2 MG tablet Commonly known as: COGENTIN Take 1 tablet (2 mg total) by mouth at bedtime. What changed: when to take this   clonazePAM 1 MG tablet Commonly known as: KLONOPIN Take 1 tablet (1 mg total) by mouth at bedtime.   Hinda Glatter Sustenna 234 MG/1.5ML Susy injection Generic drug: paliperidone Inject 1 mL into the muscle every 30 (thirty) days.   levothyroxine 75 MCG tablet Commonly known as: SYNTHROID Take 1 tablet (75 mcg total) by mouth daily.   lisinopril 2.5 MG tablet Commonly known as: ZESTRIL Take 2.5 mg by mouth every morning.   metFORMIN 500 MG 24 hr tablet Commonly known as: GLUCOPHAGE-XR Take 1 tablet by mouth 2 (two) times daily.   omeprazole 20 MG capsule Commonly known as: PRILOSEC Take 1 capsule (20 mg total) by mouth daily.   paliperidone 3 MG 24 hr tablet Commonly known as: INVEGA Take 1 tablet by mouth 2 (two) times daily.   polyethylene glycol 17 g packet Commonly known as: MIRALAX / GLYCOLAX Take 17 g by mouth 2 (two) times daily.   QUEtiapine 200 MG tablet Commonly known as: SEROQUEL Take 600 mg by mouth at bedtime.   rosuvastatin 5 MG tablet Commonly known as: CRESTOR Take 5 mg by mouth once a week.    senna-docusate 8.6-50 MG tablet Commonly known as: Senokot-S Take 1 tablet by mouth at bedtime as needed for mild constipation.   tamsulosin 0.4 MG Caps capsule Commonly known as: FLOMAX Take 1 capsule (0.4 mg total) by mouth daily. Start taking on: March 21, 2021   traZODone 100 MG tablet Commonly known as: DESYREL Take 2 tablets (200 mg total) by mouth at bedtime. For sleep.         Signed: Ivan Lacher A 03/20/2021, 11:01 AM  Patient has some urinary issues again prior to discharge with bladder scan showing over 800 cc of urine after Foley was removed.  Patient will need to have Foley catheter placed back and discharged with Foley catheter and follow-up with urology next week.  Continue stool softeners.  Continue Flomax.

## 2021-03-20 NOTE — TOC Transition Note (Signed)
Transition of Care [facility]) - CM/SW Discharge Note   Patient Details  Name: [name] MRN: [id number] Date of Birth: Jul 11, 1955  Transition of Care [facility]) CM/SW Contact:  Iona Beard, [place] Phone Number: 03/20/2021, 1:10 PM   Clinical Narrative:    CSW reached out to [place], [place], and [place]. CSW spoke to [place] with [facility] who accepts pts [facility] referral for PT and RN services. TOC signing off.   Final next level of care: [place] Barriers to Discharge: Barriers Resolved   Patient Goals and CMS Choice Patient states their goals for this hospitalization and ongoing recovery are:: Home with [facility] CMS Medicare.gov Compare Post Acute Care list provided to:: Patient Represenative (must comment) Choice offered to / list presented to : Sibling  Discharge Placement                       Discharge Plan and Services In-house Referral: Clinical Social Work Discharge Planning Services: CM Consult Post Acute Care Choice: Home Health                    HH Arranged: RN, PT [facility] Agency: [place] ([place]) Date HH Agency Contacted: 03/20/21   Representative spoke with at [place]: [place] ([place]) Interventions     Readmission Risk Interventions No flowsheet data found.

## 2021-03-21 DIAGNOSIS — Z7984 Long term (current) use of oral hypoglycemic drugs: Secondary | ICD-10-CM | POA: Diagnosis not present

## 2021-03-21 DIAGNOSIS — T83090A Other mechanical complication of cystostomy catheter, initial encounter: Secondary | ICD-10-CM | POA: Diagnosis not present

## 2021-03-21 DIAGNOSIS — E119 Type 2 diabetes mellitus without complications: Secondary | ICD-10-CM | POA: Diagnosis not present

## 2021-03-21 DIAGNOSIS — I10 Essential (primary) hypertension: Secondary | ICD-10-CM | POA: Diagnosis not present

## 2021-03-21 DIAGNOSIS — Z466 Encounter for fitting and adjustment of urinary device: Secondary | ICD-10-CM | POA: Diagnosis not present

## 2021-03-21 DIAGNOSIS — E039 Hypothyroidism, unspecified: Secondary | ICD-10-CM | POA: Diagnosis not present

## 2021-03-21 DIAGNOSIS — Z87891 Personal history of nicotine dependence: Secondary | ICD-10-CM | POA: Diagnosis not present

## 2021-03-21 DIAGNOSIS — I959 Hypotension, unspecified: Secondary | ICD-10-CM | POA: Diagnosis not present

## 2021-03-21 DIAGNOSIS — Z79899 Other long term (current) drug therapy: Secondary | ICD-10-CM | POA: Diagnosis not present

## 2021-03-21 DIAGNOSIS — K219 Gastro-esophageal reflux disease without esophagitis: Secondary | ICD-10-CM | POA: Diagnosis not present

## 2021-03-22 LAB — CULTURE, BLOOD (ROUTINE X 2)
Culture: NO GROWTH
Culture: NO GROWTH
Special Requests: ADEQUATE

## 2021-03-24 DIAGNOSIS — E1165 Type 2 diabetes mellitus with hyperglycemia: Secondary | ICD-10-CM | POA: Diagnosis not present

## 2021-03-24 DIAGNOSIS — Z299 Encounter for prophylactic measures, unspecified: Secondary | ICD-10-CM | POA: Diagnosis not present

## 2021-03-24 DIAGNOSIS — E039 Hypothyroidism, unspecified: Secondary | ICD-10-CM | POA: Diagnosis not present

## 2021-03-25 DIAGNOSIS — K5900 Constipation, unspecified: Secondary | ICD-10-CM | POA: Diagnosis not present

## 2021-03-25 DIAGNOSIS — R339 Retention of urine, unspecified: Secondary | ICD-10-CM | POA: Diagnosis not present

## 2021-03-25 DIAGNOSIS — F17210 Nicotine dependence, cigarettes, uncomplicated: Secondary | ICD-10-CM | POA: Diagnosis not present

## 2021-03-25 DIAGNOSIS — Z466 Encounter for fitting and adjustment of urinary device: Secondary | ICD-10-CM | POA: Diagnosis not present

## 2021-03-25 DIAGNOSIS — E876 Hypokalemia: Secondary | ICD-10-CM | POA: Diagnosis not present

## 2021-03-25 DIAGNOSIS — E079 Disorder of thyroid, unspecified: Secondary | ICD-10-CM | POA: Diagnosis not present

## 2021-03-25 DIAGNOSIS — Z7984 Long term (current) use of oral hypoglycemic drugs: Secondary | ICD-10-CM | POA: Diagnosis not present

## 2021-03-25 DIAGNOSIS — Z993 Dependence on wheelchair: Secondary | ICD-10-CM | POA: Diagnosis not present

## 2021-03-25 DIAGNOSIS — Z9181 History of falling: Secondary | ICD-10-CM | POA: Diagnosis not present

## 2021-03-25 DIAGNOSIS — E871 Hypo-osmolality and hyponatremia: Secondary | ICD-10-CM | POA: Diagnosis not present

## 2021-03-25 DIAGNOSIS — E119 Type 2 diabetes mellitus without complications: Secondary | ICD-10-CM | POA: Diagnosis not present

## 2021-03-25 DIAGNOSIS — Z791 Long term (current) use of non-steroidal anti-inflammatories (NSAID): Secondary | ICD-10-CM | POA: Diagnosis not present

## 2021-03-25 DIAGNOSIS — N179 Acute kidney failure, unspecified: Secondary | ICD-10-CM | POA: Diagnosis not present

## 2021-03-26 DIAGNOSIS — E871 Hypo-osmolality and hyponatremia: Secondary | ICD-10-CM | POA: Diagnosis not present

## 2021-03-26 DIAGNOSIS — R339 Retention of urine, unspecified: Secondary | ICD-10-CM | POA: Diagnosis not present

## 2021-03-26 DIAGNOSIS — F17210 Nicotine dependence, cigarettes, uncomplicated: Secondary | ICD-10-CM | POA: Diagnosis not present

## 2021-03-26 DIAGNOSIS — Z9181 History of falling: Secondary | ICD-10-CM | POA: Diagnosis not present

## 2021-03-26 DIAGNOSIS — Z7984 Long term (current) use of oral hypoglycemic drugs: Secondary | ICD-10-CM | POA: Diagnosis not present

## 2021-03-26 DIAGNOSIS — K5900 Constipation, unspecified: Secondary | ICD-10-CM | POA: Diagnosis not present

## 2021-03-26 DIAGNOSIS — Z791 Long term (current) use of non-steroidal anti-inflammatories (NSAID): Secondary | ICD-10-CM | POA: Diagnosis not present

## 2021-03-26 DIAGNOSIS — E876 Hypokalemia: Secondary | ICD-10-CM | POA: Diagnosis not present

## 2021-03-26 DIAGNOSIS — E079 Disorder of thyroid, unspecified: Secondary | ICD-10-CM | POA: Diagnosis not present

## 2021-03-26 DIAGNOSIS — N179 Acute kidney failure, unspecified: Secondary | ICD-10-CM | POA: Diagnosis not present

## 2021-03-26 DIAGNOSIS — Z993 Dependence on wheelchair: Secondary | ICD-10-CM | POA: Diagnosis not present

## 2021-03-26 DIAGNOSIS — Z466 Encounter for fitting and adjustment of urinary device: Secondary | ICD-10-CM | POA: Diagnosis not present

## 2021-03-26 DIAGNOSIS — E119 Type 2 diabetes mellitus without complications: Secondary | ICD-10-CM | POA: Diagnosis not present

## 2021-03-27 ENCOUNTER — Ambulatory Visit (INDEPENDENT_AMBULATORY_CARE_PROVIDER_SITE_OTHER): Payer: Medicare Other | Admitting: Urology

## 2021-03-27 ENCOUNTER — Encounter: Payer: Self-pay | Admitting: Urology

## 2021-03-27 ENCOUNTER — Other Ambulatory Visit: Payer: Self-pay

## 2021-03-27 VITALS — BP 117/72 | HR 98 | Wt 200.0 lb

## 2021-03-27 DIAGNOSIS — K5900 Constipation, unspecified: Secondary | ICD-10-CM

## 2021-03-27 DIAGNOSIS — R339 Retention of urine, unspecified: Secondary | ICD-10-CM | POA: Insufficient documentation

## 2021-03-27 MED ORDER — CIPROFLOXACIN HCL 500 MG PO TABS
500.0000 mg | ORAL_TABLET | Freq: Once | ORAL | Status: AC
  Administered 2021-03-27: 500 mg via ORAL

## 2021-03-27 NOTE — Progress Notes (Signed)

## 2021-03-27 NOTE — Progress Notes (Signed)
Fill and Pull Catheter Removal  Patient is present today for a catheter removal.  Patient was cleaned and prepped in a sterile fashion 548ml of sterile water/ saline was instilled into the bladder when the patient felt the urge to urinate. 32ml of water was then drained from the balloon.  A 16FR coude foley cath was removed from the bladder no complications were noted .  Patient as then given some time to void on their own.  Patient cannot void  15ml on their own after some time.  Patient tolerated well.  Performed by: Estill Bamberg RN  Follow up/ Additional notes: Simple Catheter Placement  Due to urinary retention patient is present today for a foley cath placement.  Patient was cleaned and prepped in a sterile fashion with betadine. A 16 FR coude foley catheter was inserted, urine return was noted  567ml, urine was yellow in color.  The balloon was filled with 10cc of sterile water.  A leg bag was attached for drainage. Patient was also given a night bag to take home and was given instruction on how to change from one bag to another.  Patient was given instruction on proper catheter care.  Patient tolerated well, no complications were noted   Performed by: Estill Bamberg RN  Additional notes/ Follow up: see MD notes

## 2021-03-27 NOTE — Progress Notes (Signed)
Assessment: 1. Urinary retention   2. Constipation, unspecified constipation type     Plan: I reviewed his records from his recent hospitalization at [facility] including provider notes, lab results, and imaging studies.  I personally reviewed the CT study from 03/18/2021 with results as noted below. Foley replaced following unsuccessful voiding trial. Continue tamsulosin 0.4 mg daily. Cipro x1 following catheter removal/reinsertion. Continue aggressive management of constipation as this is likely a factor in his recent episode of urinary retention. Return to office in 7-10 days for voiding trial.  Chief Complaint:  Chief Complaint  Patient presents with   Urinary Retention    History of Present Illness:  [name] is a 66 y.o. year old male who is seen for evaluation of urinary retention.  He presented to [facility] on 03/17/2021 with altered mental status.  He does have a history of schizophrenia.  He was found to have a distended abdomen on presentation.  A Foley catheter was placed with return of 2300 mL of urine.  CT imaging from 03/18/2021 demonstrated no stones or hydronephrosis, no renal masses, and a large stool burden throughout the colon.  He was started on Flomax.  He was treated for constipation.  His creatinine was 2.69 on admission and improved to 0.83 prior to discharge on 03/20/2021. His Foley has been draining well.  He continues to have problems with constipation.  His nephew reports that he has not had a bowel movement in the past week.  He is taking MiraLAX and Senokot S.  No prior history of retention. His nephew reports that he has been pulling at the Foley catheter during periods of confusion.   Past Medical History:  Past Medical History:  Diagnosis Date   Schizophrenia [facility])     Past Surgical History:  Past Surgical History:  Procedure Laterality Date   SHOULDER SURGERY      Allergies:  No Known Allergies  Family History:  No family history  on file.  Social History:  Social History   Tobacco Use   Smoking status: Every Day    Packs/day: 1.00    Types: Cigarettes  Substance Use Topics   Alcohol use: Yes    Comment: occasionaly   Drug use: No    Review of symptoms:  Constitutional:  Negative for unexplained weight loss, night sweats, fever, chills ENT:  Negative for nose bleeds, sinus pain, painful swallowing CV:  Negative for chest pain, shortness of breath, exercise intolerance, palpitations, loss of consciousness Resp:  Negative for cough, wheezing, shortness of breath GI:  Negative for nausea, vomiting, diarrhea, bloody stools GU:  Positives noted in HPI; otherwise negative for gross hematuria, dysuria, urinary incontinence Neuro:  Negative for seizures, poor balance, limb weakness, slurred speech Psych:  Negative for lack of energy, depression, anxiety Endocrine:  Negative for polydipsia, polyuria, symptoms of hypoglycemia (dizziness, hunger, sweating) Hematologic:  Negative for anemia, purpura, petechia, prolonged or excessive bleeding, use of anticoagulants  Allergic:  Negative for difficulty breathing or choking as a result of exposure to anything; no shellfish allergy; no allergic response (rash/itch) to materials, foods  Physical exam: BP 117/72    Pulse 98    Wt 200 lb (90.7 kg)    BMI 26.39 kg/m  GENERAL APPEARANCE:  Well appearing, well developed, well nourished, NAD HEENT: Atraumatic, Normocephalic, oropharynx clear. NECK: Supple without lymphadenopathy or thyromegaly. LUNGS: Clear to auscultation bilaterally. HEART: Regular Rate and Rhythm without murmurs, gallops, or rubs. ABDOMEN: Soft, non-tender, No Masses. EXTREMITIES:  Without clubbing, cyanosis, or edema. NEUROLOGIC:  Alert, in wheelchair, CN II-XII grossly intact.  MENTAL STATUS:  Appropriate. BACK:  Non-tender to palpation.  No CVAT SKIN:  Warm, dry and intact.    Results: None  Voiding trial: Volume instilled: 500 ml Volume voided:   0 ml

## 2021-03-30 DIAGNOSIS — R609 Edema, unspecified: Secondary | ICD-10-CM | POA: Diagnosis not present

## 2021-03-31 DIAGNOSIS — E079 Disorder of thyroid, unspecified: Secondary | ICD-10-CM | POA: Diagnosis not present

## 2021-03-31 DIAGNOSIS — E871 Hypo-osmolality and hyponatremia: Secondary | ICD-10-CM | POA: Diagnosis not present

## 2021-03-31 DIAGNOSIS — K5900 Constipation, unspecified: Secondary | ICD-10-CM | POA: Diagnosis not present

## 2021-03-31 DIAGNOSIS — Z466 Encounter for fitting and adjustment of urinary device: Secondary | ICD-10-CM | POA: Diagnosis not present

## 2021-03-31 DIAGNOSIS — F17210 Nicotine dependence, cigarettes, uncomplicated: Secondary | ICD-10-CM | POA: Diagnosis not present

## 2021-03-31 DIAGNOSIS — Z791 Long term (current) use of non-steroidal anti-inflammatories (NSAID): Secondary | ICD-10-CM | POA: Diagnosis not present

## 2021-03-31 DIAGNOSIS — N179 Acute kidney failure, unspecified: Secondary | ICD-10-CM | POA: Diagnosis not present

## 2021-03-31 DIAGNOSIS — Z9181 History of falling: Secondary | ICD-10-CM | POA: Diagnosis not present

## 2021-03-31 DIAGNOSIS — Z993 Dependence on wheelchair: Secondary | ICD-10-CM | POA: Diagnosis not present

## 2021-03-31 DIAGNOSIS — Z7984 Long term (current) use of oral hypoglycemic drugs: Secondary | ICD-10-CM | POA: Diagnosis not present

## 2021-03-31 DIAGNOSIS — R339 Retention of urine, unspecified: Secondary | ICD-10-CM | POA: Diagnosis not present

## 2021-03-31 DIAGNOSIS — E876 Hypokalemia: Secondary | ICD-10-CM | POA: Diagnosis not present

## 2021-03-31 DIAGNOSIS — E119 Type 2 diabetes mellitus without complications: Secondary | ICD-10-CM | POA: Diagnosis not present

## 2021-04-01 DIAGNOSIS — Z466 Encounter for fitting and adjustment of urinary device: Secondary | ICD-10-CM | POA: Diagnosis not present

## 2021-04-01 DIAGNOSIS — Z993 Dependence on wheelchair: Secondary | ICD-10-CM | POA: Diagnosis not present

## 2021-04-01 DIAGNOSIS — Z791 Long term (current) use of non-steroidal anti-inflammatories (NSAID): Secondary | ICD-10-CM | POA: Diagnosis not present

## 2021-04-01 DIAGNOSIS — Z7984 Long term (current) use of oral hypoglycemic drugs: Secondary | ICD-10-CM | POA: Diagnosis not present

## 2021-04-01 DIAGNOSIS — E876 Hypokalemia: Secondary | ICD-10-CM | POA: Diagnosis not present

## 2021-04-01 DIAGNOSIS — F17210 Nicotine dependence, cigarettes, uncomplicated: Secondary | ICD-10-CM | POA: Diagnosis not present

## 2021-04-01 DIAGNOSIS — E871 Hypo-osmolality and hyponatremia: Secondary | ICD-10-CM | POA: Diagnosis not present

## 2021-04-01 DIAGNOSIS — R339 Retention of urine, unspecified: Secondary | ICD-10-CM | POA: Diagnosis not present

## 2021-04-01 DIAGNOSIS — K5900 Constipation, unspecified: Secondary | ICD-10-CM | POA: Diagnosis not present

## 2021-04-01 DIAGNOSIS — Z9181 History of falling: Secondary | ICD-10-CM | POA: Diagnosis not present

## 2021-04-01 DIAGNOSIS — E079 Disorder of thyroid, unspecified: Secondary | ICD-10-CM | POA: Diagnosis not present

## 2021-04-01 DIAGNOSIS — N179 Acute kidney failure, unspecified: Secondary | ICD-10-CM | POA: Diagnosis not present

## 2021-04-01 DIAGNOSIS — E119 Type 2 diabetes mellitus without complications: Secondary | ICD-10-CM | POA: Diagnosis not present

## 2021-04-02 DIAGNOSIS — E876 Hypokalemia: Secondary | ICD-10-CM | POA: Diagnosis not present

## 2021-04-02 DIAGNOSIS — Z466 Encounter for fitting and adjustment of urinary device: Secondary | ICD-10-CM | POA: Diagnosis not present

## 2021-04-02 DIAGNOSIS — R339 Retention of urine, unspecified: Secondary | ICD-10-CM | POA: Diagnosis not present

## 2021-04-02 DIAGNOSIS — Z993 Dependence on wheelchair: Secondary | ICD-10-CM | POA: Diagnosis not present

## 2021-04-02 DIAGNOSIS — E871 Hypo-osmolality and hyponatremia: Secondary | ICD-10-CM | POA: Diagnosis not present

## 2021-04-02 DIAGNOSIS — E119 Type 2 diabetes mellitus without complications: Secondary | ICD-10-CM | POA: Diagnosis not present

## 2021-04-02 DIAGNOSIS — F17210 Nicotine dependence, cigarettes, uncomplicated: Secondary | ICD-10-CM | POA: Diagnosis not present

## 2021-04-02 DIAGNOSIS — Z7984 Long term (current) use of oral hypoglycemic drugs: Secondary | ICD-10-CM | POA: Diagnosis not present

## 2021-04-02 DIAGNOSIS — N179 Acute kidney failure, unspecified: Secondary | ICD-10-CM | POA: Diagnosis not present

## 2021-04-02 DIAGNOSIS — E079 Disorder of thyroid, unspecified: Secondary | ICD-10-CM | POA: Diagnosis not present

## 2021-04-02 DIAGNOSIS — Z791 Long term (current) use of non-steroidal anti-inflammatories (NSAID): Secondary | ICD-10-CM | POA: Diagnosis not present

## 2021-04-02 DIAGNOSIS — Z9181 History of falling: Secondary | ICD-10-CM | POA: Diagnosis not present

## 2021-04-02 DIAGNOSIS — K5900 Constipation, unspecified: Secondary | ICD-10-CM | POA: Diagnosis not present

## 2021-04-06 ENCOUNTER — Other Ambulatory Visit: Payer: Self-pay

## 2021-04-06 ENCOUNTER — Ambulatory Visit (INDEPENDENT_AMBULATORY_CARE_PROVIDER_SITE_OTHER): Payer: Medicare Other

## 2021-04-06 VITALS — BP 134/68 | HR 75

## 2021-04-06 DIAGNOSIS — R339 Retention of urine, unspecified: Secondary | ICD-10-CM

## 2021-04-06 NOTE — Progress Notes (Addendum)
Fill and Pull Catheter Removal  Patient is present today for a catheter removal.  Patient was cleaned and prepped in a sterile fashion of sterile water/ saline was instilled into the bladder when the patient felt the urge to urinate. 59ml of water was then drained from the balloon.  A 16FR foley cath was removed from the bladder no complications were noted .  Patient as then given some time to void on their own.  Patient cannot void  on their own after some time.  Patient tolerated well.  Performed by: Shanda Bumps CMA  Follow up/ Additional notes: Patient went home to try to urinate. He was understanding that if he is unable to urinate to return to the office around 3pm today to have a catheter placed.  Simple Catheter Placement  Due to urinary retention patient is present today for a foley cath placement.  Patient was cleaned and prepped in a sterile fashion with betadine. A 16 FR foley catheter was inserted, urine return was noted  , urine was yellow in color.  The balloon was filled with 10cc of sterile water.  A leg bag was attached for drainage. Patient was also given a night bag to take home and was given instruction on how to change from one bag to another.  Patient was given instruction on proper catheter care.  Patient tolerated well, complications were noted as: none    Performed by: Shanda Bumps CMA  Additional notes/ Follow up: Follow up with Dr. Pete Glatter on 02/20

## 2021-04-07 DIAGNOSIS — N179 Acute kidney failure, unspecified: Secondary | ICD-10-CM | POA: Diagnosis not present

## 2021-04-07 DIAGNOSIS — Z466 Encounter for fitting and adjustment of urinary device: Secondary | ICD-10-CM | POA: Diagnosis not present

## 2021-04-07 DIAGNOSIS — Z7984 Long term (current) use of oral hypoglycemic drugs: Secondary | ICD-10-CM | POA: Diagnosis not present

## 2021-04-07 DIAGNOSIS — K5900 Constipation, unspecified: Secondary | ICD-10-CM | POA: Diagnosis not present

## 2021-04-07 DIAGNOSIS — Z9181 History of falling: Secondary | ICD-10-CM | POA: Diagnosis not present

## 2021-04-07 DIAGNOSIS — R339 Retention of urine, unspecified: Secondary | ICD-10-CM | POA: Diagnosis not present

## 2021-04-07 DIAGNOSIS — E871 Hypo-osmolality and hyponatremia: Secondary | ICD-10-CM | POA: Diagnosis not present

## 2021-04-07 DIAGNOSIS — E876 Hypokalemia: Secondary | ICD-10-CM | POA: Diagnosis not present

## 2021-04-07 DIAGNOSIS — E079 Disorder of thyroid, unspecified: Secondary | ICD-10-CM | POA: Diagnosis not present

## 2021-04-07 DIAGNOSIS — Z791 Long term (current) use of non-steroidal anti-inflammatories (NSAID): Secondary | ICD-10-CM | POA: Diagnosis not present

## 2021-04-07 DIAGNOSIS — Z993 Dependence on wheelchair: Secondary | ICD-10-CM | POA: Diagnosis not present

## 2021-04-07 DIAGNOSIS — E119 Type 2 diabetes mellitus without complications: Secondary | ICD-10-CM | POA: Diagnosis not present

## 2021-04-07 DIAGNOSIS — F17210 Nicotine dependence, cigarettes, uncomplicated: Secondary | ICD-10-CM | POA: Diagnosis not present

## 2021-04-09 DIAGNOSIS — Z7984 Long term (current) use of oral hypoglycemic drugs: Secondary | ICD-10-CM | POA: Diagnosis not present

## 2021-04-09 DIAGNOSIS — K5900 Constipation, unspecified: Secondary | ICD-10-CM | POA: Diagnosis not present

## 2021-04-09 DIAGNOSIS — F17210 Nicotine dependence, cigarettes, uncomplicated: Secondary | ICD-10-CM | POA: Diagnosis not present

## 2021-04-09 DIAGNOSIS — N179 Acute kidney failure, unspecified: Secondary | ICD-10-CM | POA: Diagnosis not present

## 2021-04-09 DIAGNOSIS — Z791 Long term (current) use of non-steroidal anti-inflammatories (NSAID): Secondary | ICD-10-CM | POA: Diagnosis not present

## 2021-04-09 DIAGNOSIS — E079 Disorder of thyroid, unspecified: Secondary | ICD-10-CM | POA: Diagnosis not present

## 2021-04-09 DIAGNOSIS — E119 Type 2 diabetes mellitus without complications: Secondary | ICD-10-CM | POA: Diagnosis not present

## 2021-04-09 DIAGNOSIS — Z993 Dependence on wheelchair: Secondary | ICD-10-CM | POA: Diagnosis not present

## 2021-04-09 DIAGNOSIS — E876 Hypokalemia: Secondary | ICD-10-CM | POA: Diagnosis not present

## 2021-04-09 DIAGNOSIS — E871 Hypo-osmolality and hyponatremia: Secondary | ICD-10-CM | POA: Diagnosis not present

## 2021-04-09 DIAGNOSIS — Z9181 History of falling: Secondary | ICD-10-CM | POA: Diagnosis not present

## 2021-04-09 DIAGNOSIS — R339 Retention of urine, unspecified: Secondary | ICD-10-CM | POA: Diagnosis not present

## 2021-04-09 DIAGNOSIS — Z466 Encounter for fitting and adjustment of urinary device: Secondary | ICD-10-CM | POA: Diagnosis not present

## 2021-04-13 ENCOUNTER — Encounter: Payer: Self-pay | Admitting: Urology

## 2021-04-13 ENCOUNTER — Ambulatory Visit (INDEPENDENT_AMBULATORY_CARE_PROVIDER_SITE_OTHER): Payer: Medicare Other | Admitting: Urology

## 2021-04-13 ENCOUNTER — Other Ambulatory Visit: Payer: Self-pay

## 2021-04-13 VITALS — BP 104/68 | HR 106

## 2021-04-13 DIAGNOSIS — Z9181 History of falling: Secondary | ICD-10-CM | POA: Diagnosis not present

## 2021-04-13 DIAGNOSIS — E079 Disorder of thyroid, unspecified: Secondary | ICD-10-CM | POA: Diagnosis not present

## 2021-04-13 DIAGNOSIS — N179 Acute kidney failure, unspecified: Secondary | ICD-10-CM | POA: Diagnosis not present

## 2021-04-13 DIAGNOSIS — R339 Retention of urine, unspecified: Secondary | ICD-10-CM

## 2021-04-13 DIAGNOSIS — Z993 Dependence on wheelchair: Secondary | ICD-10-CM | POA: Diagnosis not present

## 2021-04-13 DIAGNOSIS — E871 Hypo-osmolality and hyponatremia: Secondary | ICD-10-CM | POA: Diagnosis not present

## 2021-04-13 DIAGNOSIS — E119 Type 2 diabetes mellitus without complications: Secondary | ICD-10-CM | POA: Diagnosis not present

## 2021-04-13 DIAGNOSIS — Z791 Long term (current) use of non-steroidal anti-inflammatories (NSAID): Secondary | ICD-10-CM | POA: Diagnosis not present

## 2021-04-13 DIAGNOSIS — Z466 Encounter for fitting and adjustment of urinary device: Secondary | ICD-10-CM | POA: Diagnosis not present

## 2021-04-13 DIAGNOSIS — E876 Hypokalemia: Secondary | ICD-10-CM | POA: Diagnosis not present

## 2021-04-13 DIAGNOSIS — F17210 Nicotine dependence, cigarettes, uncomplicated: Secondary | ICD-10-CM | POA: Diagnosis not present

## 2021-04-13 DIAGNOSIS — K5900 Constipation, unspecified: Secondary | ICD-10-CM | POA: Diagnosis not present

## 2021-04-13 DIAGNOSIS — Z7984 Long term (current) use of oral hypoglycemic drugs: Secondary | ICD-10-CM | POA: Diagnosis not present

## 2021-04-13 LAB — MICROSCOPIC EXAMINATION
RBC, Urine: >30 /hpf — AB (ref 0–2)
Renal Epithel, UA: NONE SEEN /hpf

## 2021-04-13 LAB — URINALYSIS, ROUTINE W REFLEX MICROSCOPIC
Bilirubin, UA: NEGATIVE
Glucose, UA: NEGATIVE
Nitrite, UA: NEGATIVE
Specific Gravity, UA: >1.03 — ABNORMAL HIGH (ref 1.005–1.030)
Urobilinogen, Ur: 2 mg/dL — ABNORMAL HIGH (ref 0.2–1.0)
pH, UA: 6.5 (ref 5.0–7.5)

## 2021-04-13 MED ORDER — SILODOSIN 8 MG PO CAPS: 8.0000 mg | ORAL_CAPSULE | Freq: Every day | ORAL | 11 refills | Status: DC

## 2021-04-13 NOTE — Progress Notes (Signed)
° °  Assessment: 1. Urinary retention     Plan: Continue foley  Continue management of constipation Begin Rapaflo 8 mg daily in place of tamsulosin.  Prescription sent. Return to office in 7-10 days for cystoscopy and voiding trial. Urine culture sent today.  Chief Complaint:  Chief Complaint  Patient presents with   Urinary Retention    History of Present Illness:  [name] is a 66 y.o. year old male who is seen for further evaluation of urinary retention.  He presented to [facility] on 03/17/2021 with altered mental status.  He does have a history of schizophrenia.  He was found to have a distended abdomen on presentation.  A Foley catheter was placed with return of 2300 mL of urine.  CT imaging from 03/18/2021 demonstrated no stones or hydronephrosis, no renal masses, and a large stool burden throughout the colon.  He was started on Flomax.  He was treated for constipation.  His creatinine was 2.69 on admission and improved to 0.83 prior to discharge on 03/20/2021. At his initial visit on 03/27/2021, his Foley catheter was draining well.  He continued to have problems with constipation.  His nephew reported that he had not had a bowel movement in the past week.  He was taking MiraLAX and Senokot S.  No prior history of retention. His nephew reports that he has been pulling at the Foley catheter during periods of confusion. A voiding trial was attempted on 03/27/2021 but was unsuccessful and the catheter was replaced. The patient returned for another voiding trial on 04/06/2021.  He was unable to void in the office and returned later that day unable to void requiring replacement of a Foley catheter.  He returns today for follow-up.  His Foley has been draining well.  He continues to have issues with constipation.  He had multiple bowel movements approximately 4 days ago.  No recent bowel movement.  He continues on MiraLAX and Senokot.  He also continues to take Flomax daily.  Portions of  the above documentation were copied from a prior visit for review purposes only.  Past Medical History:  Past Medical History:  Diagnosis Date   Schizophrenia [facility])     Past Surgical History:  Past Surgical History:  Procedure Laterality Date   SHOULDER SURGERY      Allergies:  No Known Allergies  Family History:  No family history on file.  Social History:  Social History   Tobacco Use   Smoking status: Every Day    Packs/day: 1.00    Types: Cigarettes  Substance Use Topics   Alcohol use: Yes    Comment: occasionaly   Drug use: No    ROS: Constitutional:  Negative for fever, chills, weight loss CV: Negative for chest pain, previous MI, hypertension Respiratory:  Negative for shortness of breath, wheezing, sleep apnea, frequent cough GI:  Negative for nausea, vomiting, bloody stool, GERD  Physical exam: BP 104/68    Pulse (!) 106  GENERAL APPEARANCE:  Chronically ill appearing, well nourished, NAD HEENT:  Atraumatic, normocephalic, oropharynx clear NECK:  Supple without lymphadenopathy or thyromegaly ABDOMEN:  Soft, non-tender, no masses EXTREMITIES:  Without clubbing, cyanosis, or edema NEUROLOGIC:  Alert, does not answer questions, in wheelchair, CN II-XII grossly intact MENTAL STATUS:  appropriate BACK:  Non-tender to palpation, No CVAT SKIN:  Warm, dry, and intact   Results: U/A:  6-10 WBC, >30 RBC, many bacteria

## 2021-04-15 DIAGNOSIS — E119 Type 2 diabetes mellitus without complications: Secondary | ICD-10-CM | POA: Diagnosis not present

## 2021-04-15 DIAGNOSIS — Z87891 Personal history of nicotine dependence: Secondary | ICD-10-CM | POA: Diagnosis not present

## 2021-04-15 DIAGNOSIS — K5900 Constipation, unspecified: Secondary | ICD-10-CM | POA: Diagnosis not present

## 2021-04-15 DIAGNOSIS — Z7984 Long term (current) use of oral hypoglycemic drugs: Secondary | ICD-10-CM | POA: Diagnosis not present

## 2021-04-15 DIAGNOSIS — A4902 Methicillin resistant Staphylococcus aureus infection, unspecified site: Secondary | ICD-10-CM | POA: Diagnosis not present

## 2021-04-15 DIAGNOSIS — B372 Candidiasis of skin and nail: Secondary | ICD-10-CM | POA: Diagnosis not present

## 2021-04-15 DIAGNOSIS — Z9181 History of falling: Secondary | ICD-10-CM | POA: Diagnosis not present

## 2021-04-15 DIAGNOSIS — E876 Hypokalemia: Secondary | ICD-10-CM | POA: Diagnosis not present

## 2021-04-15 DIAGNOSIS — R339 Retention of urine, unspecified: Secondary | ICD-10-CM | POA: Diagnosis not present

## 2021-04-15 DIAGNOSIS — E871 Hypo-osmolality and hyponatremia: Secondary | ICD-10-CM | POA: Diagnosis not present

## 2021-04-15 DIAGNOSIS — B9562 Methicillin resistant Staphylococcus aureus infection as the cause of diseases classified elsewhere: Secondary | ICD-10-CM | POA: Diagnosis not present

## 2021-04-15 DIAGNOSIS — F17210 Nicotine dependence, cigarettes, uncomplicated: Secondary | ICD-10-CM | POA: Diagnosis not present

## 2021-04-15 DIAGNOSIS — Z791 Long term (current) use of non-steroidal anti-inflammatories (NSAID): Secondary | ICD-10-CM | POA: Diagnosis not present

## 2021-04-15 DIAGNOSIS — N179 Acute kidney failure, unspecified: Secondary | ICD-10-CM | POA: Diagnosis not present

## 2021-04-15 DIAGNOSIS — Z993 Dependence on wheelchair: Secondary | ICD-10-CM | POA: Diagnosis not present

## 2021-04-15 DIAGNOSIS — Z466 Encounter for fitting and adjustment of urinary device: Secondary | ICD-10-CM | POA: Diagnosis not present

## 2021-04-15 DIAGNOSIS — E079 Disorder of thyroid, unspecified: Secondary | ICD-10-CM | POA: Diagnosis not present

## 2021-04-17 DIAGNOSIS — E1165 Type 2 diabetes mellitus with hyperglycemia: Secondary | ICD-10-CM | POA: Diagnosis not present

## 2021-04-17 DIAGNOSIS — I10 Essential (primary) hypertension: Secondary | ICD-10-CM | POA: Diagnosis not present

## 2021-04-17 DIAGNOSIS — E039 Hypothyroidism, unspecified: Secondary | ICD-10-CM | POA: Diagnosis not present

## 2021-04-17 DIAGNOSIS — Z7189 Other specified counseling: Secondary | ICD-10-CM | POA: Diagnosis not present

## 2021-04-17 DIAGNOSIS — Z299 Encounter for prophylactic measures, unspecified: Secondary | ICD-10-CM | POA: Diagnosis not present

## 2021-04-17 DIAGNOSIS — Z0000 Encounter for general adult medical examination without abnormal findings: Secondary | ICD-10-CM | POA: Diagnosis not present

## 2021-04-17 DIAGNOSIS — Z6824 Body mass index (BMI) 24.0-24.9, adult: Secondary | ICD-10-CM | POA: Diagnosis not present

## 2021-04-17 LAB — URINE CULTURE

## 2021-04-17 MED ORDER — SULFAMETHOXAZOLE-TRIMETHOPRIM 800-160 MG PO TABS: 1.0000 | ORAL_TABLET | Freq: Two times a day (BID) | ORAL | 0 refills | Status: AC

## 2021-04-17 NOTE — Addendum Note (Signed)
Addended by: Milderd Meager on: 04/17/2021 09:10 AM   Modules accepted: Orders

## 2021-04-20 ENCOUNTER — Telehealth: Payer: Self-pay

## 2021-04-20 NOTE — Telephone Encounter (Signed)
-----   Message from Primus Bravo, MD sent at 04/17/2021  9:10 AM EST ----- Please notify patient to begin Bactrim DS BID x 7 days for UTI.

## 2021-04-20 NOTE — Telephone Encounter (Signed)
Spoke with sister, patient received medication 02/24

## 2021-04-21 ENCOUNTER — Telehealth: Payer: Self-pay

## 2021-04-21 DIAGNOSIS — N179 Acute kidney failure, unspecified: Secondary | ICD-10-CM | POA: Diagnosis not present

## 2021-04-21 DIAGNOSIS — J449 Chronic obstructive pulmonary disease, unspecified: Secondary | ICD-10-CM | POA: Diagnosis not present

## 2021-04-21 DIAGNOSIS — Z7984 Long term (current) use of oral hypoglycemic drugs: Secondary | ICD-10-CM | POA: Diagnosis not present

## 2021-04-21 DIAGNOSIS — E079 Disorder of thyroid, unspecified: Secondary | ICD-10-CM | POA: Diagnosis not present

## 2021-04-21 DIAGNOSIS — E119 Type 2 diabetes mellitus without complications: Secondary | ICD-10-CM | POA: Diagnosis not present

## 2021-04-21 DIAGNOSIS — F17210 Nicotine dependence, cigarettes, uncomplicated: Secondary | ICD-10-CM | POA: Diagnosis not present

## 2021-04-21 DIAGNOSIS — Z9181 History of falling: Secondary | ICD-10-CM | POA: Diagnosis not present

## 2021-04-21 DIAGNOSIS — Z791 Long term (current) use of non-steroidal anti-inflammatories (NSAID): Secondary | ICD-10-CM | POA: Diagnosis not present

## 2021-04-21 DIAGNOSIS — Z466 Encounter for fitting and adjustment of urinary device: Secondary | ICD-10-CM | POA: Diagnosis not present

## 2021-04-21 DIAGNOSIS — K5900 Constipation, unspecified: Secondary | ICD-10-CM | POA: Diagnosis not present

## 2021-04-21 DIAGNOSIS — E871 Hypo-osmolality and hyponatremia: Secondary | ICD-10-CM | POA: Diagnosis not present

## 2021-04-21 DIAGNOSIS — R339 Retention of urine, unspecified: Secondary | ICD-10-CM | POA: Diagnosis not present

## 2021-04-21 DIAGNOSIS — Z993 Dependence on wheelchair: Secondary | ICD-10-CM | POA: Diagnosis not present

## 2021-04-21 DIAGNOSIS — I10 Essential (primary) hypertension: Secondary | ICD-10-CM | POA: Diagnosis not present

## 2021-04-21 DIAGNOSIS — E876 Hypokalemia: Secondary | ICD-10-CM | POA: Diagnosis not present

## 2021-04-21 NOTE — Telephone Encounter (Signed)
-----   Message from Milderd Meager, MD sent at 04/21/2021  7:57 AM EST ----- Yes, he needs to continue Bactrim since the Clindamycin will not treat his UTI.  ----- Message ----- From: Gustavus Messing, LPN Sent: 10/26/90  10:09 AM EST To: Milderd Meager, MD  Spoke to sister and she states brother went to ER on 04/15/21 and was started on clindamycin (CLEOCIN) 300 MG capsule po qid for 10 days. Would you like to continue the Bactrim? ----- Message ----- From: Milderd Meager., MD Sent: 04/17/2021   9:10 AM EST To: [facility]  Please notify patient to begin Bactrim DS BID x 7 days for UTI.

## 2021-04-21 NOTE — Telephone Encounter (Signed)
Patient started on abt 2/24.  See other telephone note.

## 2021-04-22 DIAGNOSIS — E079 Disorder of thyroid, unspecified: Secondary | ICD-10-CM | POA: Diagnosis not present

## 2021-04-22 DIAGNOSIS — Z9181 History of falling: Secondary | ICD-10-CM | POA: Diagnosis not present

## 2021-04-22 DIAGNOSIS — R339 Retention of urine, unspecified: Secondary | ICD-10-CM | POA: Diagnosis not present

## 2021-04-22 DIAGNOSIS — N179 Acute kidney failure, unspecified: Secondary | ICD-10-CM | POA: Diagnosis not present

## 2021-04-22 DIAGNOSIS — E871 Hypo-osmolality and hyponatremia: Secondary | ICD-10-CM | POA: Diagnosis not present

## 2021-04-22 DIAGNOSIS — Z993 Dependence on wheelchair: Secondary | ICD-10-CM | POA: Diagnosis not present

## 2021-04-22 DIAGNOSIS — F17210 Nicotine dependence, cigarettes, uncomplicated: Secondary | ICD-10-CM | POA: Diagnosis not present

## 2021-04-22 DIAGNOSIS — Z7984 Long term (current) use of oral hypoglycemic drugs: Secondary | ICD-10-CM | POA: Diagnosis not present

## 2021-04-22 DIAGNOSIS — K5900 Constipation, unspecified: Secondary | ICD-10-CM | POA: Diagnosis not present

## 2021-04-22 DIAGNOSIS — E119 Type 2 diabetes mellitus without complications: Secondary | ICD-10-CM | POA: Diagnosis not present

## 2021-04-22 DIAGNOSIS — Z466 Encounter for fitting and adjustment of urinary device: Secondary | ICD-10-CM | POA: Diagnosis not present

## 2021-04-22 DIAGNOSIS — Z791 Long term (current) use of non-steroidal anti-inflammatories (NSAID): Secondary | ICD-10-CM | POA: Diagnosis not present

## 2021-04-22 DIAGNOSIS — E876 Hypokalemia: Secondary | ICD-10-CM | POA: Diagnosis not present

## 2021-04-23 ENCOUNTER — Other Ambulatory Visit: Payer: Self-pay

## 2021-04-23 ENCOUNTER — Ambulatory Visit (INDEPENDENT_AMBULATORY_CARE_PROVIDER_SITE_OTHER): Payer: Medicare Other | Admitting: Urology

## 2021-04-23 VITALS — BP 68/44 | HR 112

## 2021-04-23 DIAGNOSIS — I959 Hypotension, unspecified: Secondary | ICD-10-CM | POA: Diagnosis not present

## 2021-04-23 DIAGNOSIS — K8020 Calculus of gallbladder without cholecystitis without obstruction: Secondary | ICD-10-CM | POA: Diagnosis not present

## 2021-04-23 DIAGNOSIS — R339 Retention of urine, unspecified: Secondary | ICD-10-CM | POA: Diagnosis not present

## 2021-04-23 DIAGNOSIS — R9431 Abnormal electrocardiogram [ECG] [EKG]: Secondary | ICD-10-CM | POA: Diagnosis not present

## 2021-04-23 DIAGNOSIS — Z87440 Personal history of urinary (tract) infections: Secondary | ICD-10-CM

## 2021-04-23 DIAGNOSIS — I700 Atherosclerosis of aorta: Secondary | ICD-10-CM | POA: Diagnosis not present

## 2021-04-23 DIAGNOSIS — K5900 Constipation, unspecified: Secondary | ICD-10-CM | POA: Diagnosis not present

## 2021-04-23 DIAGNOSIS — Z87891 Personal history of nicotine dependence: Secondary | ICD-10-CM | POA: Diagnosis not present

## 2021-04-23 NOTE — Progress Notes (Signed)
MD aware of patient BP. BP checked 3 times and in both arms. Patient is alert and oriented to self/place. ?Nephew reports he had difficulty with getting him to stand this am to get him dressed.  ?

## 2021-04-23 NOTE — Progress Notes (Signed)
? ?Assessment: ?1. Urinary retention   ?2. History of UTI   ?3. Hypotension, unspecified hypotension type   ? ? ?Plan: ?Recommend postponing cystoscopy and voiding trial today due to hypotension. ?I have recommended evaluation in the ER due to his low blood pressure and tachycardia. ?Continue foley  ?Continue management of constipation ?Continue Rapaflo 8 mg daily  ?Return to office in 4-5 days for cystoscopy and voiding trial. ?Complete Bactrim ? ?Chief Complaint:  ?Chief Complaint  ?Patient presents with  ? Urinary Retention  ? ? ?History of Present Illness: ? ?[name] is a 66 y.o. year old male who is seen for further evaluation of urinary retention.  He presented to [facility] on 03/17/2021 with altered mental status.  He does have a history of schizophrenia.  He was found to have a distended abdomen on presentation.  A Foley catheter was placed with return of 2300 mL of urine.  CT imaging from 03/18/2021 demonstrated no stones or hydronephrosis, no renal masses, and a large stool burden throughout the colon.  He was started on Flomax.  He was treated for constipation.  His creatinine was 2.69 on admission and improved to 0.83 prior to discharge on 03/20/2021. ?At his initial visit on 03/27/2021, his Foley catheter was draining well.  He continued to have problems with constipation.  His nephew reported that he had not had a bowel movement in the past week.  He was taking MiraLAX and Senokot S.  No prior history of retention. ?His nephew reports that he has been pulling at the Foley catheter during periods of confusion. ?A voiding trial was attempted on 03/27/2021 but was unsuccessful and the catheter was replaced. ?The patient returned for another voiding trial on 04/06/2021.  He was unable to void in the office and returned later that day unable to void requiring replacement of a Foley catheter. ? ?At his visit on 04/13/21, he continued with a foley catheter.  He continued to have issues with constipation  although he had multiple bowel movements approximately 4 days prior.  No recent bowel movement.  He continues on MiraLAX and Senokot.  He also continued to take Flomax daily. ?He was continued with the Foley catheter.  He was changed to Rapaflo 8 mg daily in place of tamsulosin. ?Urine culture grew >100 K MRSA.  He was treated with Bactrim. ?He presents today for cystoscopy and voiding trial. ? ?He continues on Bactrim and Rapaflo.  His catheter has been draining well.  No gross hematuria.  No fevers or chills.  He has had some slight dizziness this morning. ? ?Portions of the above documentation were copied from a prior visit for review purposes only. ? ?Past Medical History:  ?Past Medical History:  ?Diagnosis Date  ? Schizophrenia (HCC)   ? ? ?Past Surgical History:  ?Past Surgical History:  ?Procedure Laterality Date  ? SHOULDER SURGERY    ? ? ?Allergies:  ?No Known Allergies ? ?Family History:  ?No family history on file. ? ?Social History:  ?Social History  ? ?Tobacco Use  ? Smoking status: Every Day  ?  Packs/day: 1.00  ?  Types: Cigarettes  ?Substance Use Topics  ? Alcohol use: Yes  ?  Comment: occasionaly  ? Drug use: No  ? ? ?ROS: ?Constitutional:  Negative for fever, chills, weight loss ?CV: Negative for chest pain, previous MI, hypertension ?Respiratory:  Negative for shortness of breath, wheezing, sleep apnea, frequent cough ?GI:  Negative for nausea, vomiting, bloody stool, GERD ? ?  Physical exam: ?BP (!) 68/44   Pulse (!) 112  ?GENERAL APPEARANCE:  Well appearing, well developed, well nourished, NAD ?HEENT:  Atraumatic, normocephalic, oropharynx clear ?NECK:  Supple without lymphadenopathy or thyromegaly ?ABDOMEN:  Soft, non-tender, no masses ?EXTREMITIES:  Moves all extremities well, without clubbing, cyanosis, or edema ?NEUROLOGIC:  Alert and oriented x 3, in wheelchair, CN II-XII grossly intact ?MENTAL STATUS:  appropriate ?BACK:  Non-tender to palpation, No CVAT ?SKIN:  Warm, dry, and intact ?GU:   foley draining yellow urine ? ? ?Results: ?None ? ?

## 2021-04-27 ENCOUNTER — Other Ambulatory Visit: Payer: Medicare Other | Admitting: Urology

## 2021-04-27 NOTE — Progress Notes (Deleted)
? ?Assessment: ?No diagnosis found. ? ? ?Plan: ?Recommend postponing cystoscopy and voiding trial today due to hypotension. ?I have recommended evaluation in the ER due to his low blood pressure and tachycardia. ?Continue foley  ?Continue management of constipation ?Continue Rapaflo 8 mg daily  ?Return to office in 4-5 days for cystoscopy and voiding trial. ?Complete Bactrim ? ?Chief Complaint:  ?No chief complaint on file. ? ? ?History of Present Illness: ? ?[name] is a 66 y.o. year old male who is seen for further evaluation of urinary retention.  He presented to [facility] on 03/17/2021 with altered mental status.  He does have a history of schizophrenia.  He was found to have a distended abdomen on presentation.  A Foley catheter was placed with return of 2300 mL of urine.  CT imaging from 03/18/2021 demonstrated no stones or hydronephrosis, no renal masses, and a large stool burden throughout the colon.  He was started on Flomax.  He was treated for constipation.  His creatinine was 2.69 on admission and improved to 0.83 prior to discharge on 03/20/2021. ?At his initial visit on 03/27/2021, his Foley catheter was draining well.  He continued to have problems with constipation.  His nephew reported that he had not had a bowel movement in the past week.  He was taking MiraLAX and Senokot S.  No prior history of retention. ?His nephew reports that he has been pulling at the Foley catheter during periods of confusion. ?A voiding trial was attempted on 03/27/2021 but was unsuccessful and the catheter was replaced. ?The patient returned for another voiding trial on 04/06/2021.  He was unable to void in the office and returned later that day unable to void requiring replacement of a Foley catheter. ? ?At his visit on 04/13/21, he continued with a foley catheter.  He continued to have issues with constipation although he had multiple bowel movements approximately 4 days prior.  No recent bowel movement.  He continues  on MiraLAX and Senokot.  He also continued to take Flomax daily. ?He was continued with the Foley catheter.  He was changed to Rapaflo 8 mg daily in place of tamsulosin. ?Urine culture grew >100 K MRSA.  He was treated with Bactrim. ?He presents today for cystoscopy and voiding trial. ? ?He continues on Bactrim and Rapaflo.  His catheter has been draining well.  No gross hematuria.  No fevers or chills.  He has had some slight dizziness this morning. ? ?Portions of the above documentation were copied from a prior visit for review purposes only. ? ?Past Medical History:  ?Past Medical History:  ?Diagnosis Date  ? Schizophrenia (HCC)   ? ? ?Past Surgical History:  ?Past Surgical History:  ?Procedure Laterality Date  ? SHOULDER SURGERY    ? ? ?Allergies:  ?No Known Allergies ? ?Family History:  ?No family history on file. ? ?Social History:  ?Social History  ? ?Tobacco Use  ? Smoking status: Every Day  ?  Packs/day: 1.00  ?  Types: Cigarettes  ?Substance Use Topics  ? Alcohol use: Yes  ?  Comment: occasionaly  ? Drug use: No  ? ? ?ROS: ?Constitutional:  Negative for fever, chills, weight loss ?CV: Negative for chest pain, previous MI, hypertension ?Respiratory:  Negative for shortness of breath, wheezing, sleep apnea, frequent cough ?GI:  Negative for nausea, vomiting, bloody stool, GERD ? ?Physical exam: ?There were no vitals taken for this visit. ?GENERAL APPEARANCE:  Well appearing, well developed, well nourished, NAD ?HEENT:  Atraumatic, normocephalic, oropharynx clear ?NECK:  Supple without lymphadenopathy or thyromegaly ?ABDOMEN:  Soft, non-tender, no masses ?EXTREMITIES:  Moves all extremities well, without clubbing, cyanosis, or edema ?NEUROLOGIC:  Alert and oriented x 3, in wheelchair, CN II-XII grossly intact ?MENTAL STATUS:  appropriate ?BACK:  Non-tender to palpation, No CVAT ?SKIN:  Warm, dry, and intact ?GU:  foley draining yellow urine ? ? ?Results: ?None ? ?

## 2021-04-28 DIAGNOSIS — Z993 Dependence on wheelchair: Secondary | ICD-10-CM | POA: Diagnosis not present

## 2021-04-28 DIAGNOSIS — R339 Retention of urine, unspecified: Secondary | ICD-10-CM | POA: Diagnosis not present

## 2021-04-28 DIAGNOSIS — K5900 Constipation, unspecified: Secondary | ICD-10-CM | POA: Diagnosis not present

## 2021-04-28 DIAGNOSIS — Z466 Encounter for fitting and adjustment of urinary device: Secondary | ICD-10-CM | POA: Diagnosis not present

## 2021-04-28 DIAGNOSIS — E871 Hypo-osmolality and hyponatremia: Secondary | ICD-10-CM | POA: Diagnosis not present

## 2021-04-28 DIAGNOSIS — E119 Type 2 diabetes mellitus without complications: Secondary | ICD-10-CM | POA: Diagnosis not present

## 2021-04-28 DIAGNOSIS — E079 Disorder of thyroid, unspecified: Secondary | ICD-10-CM | POA: Diagnosis not present

## 2021-04-28 DIAGNOSIS — Z791 Long term (current) use of non-steroidal anti-inflammatories (NSAID): Secondary | ICD-10-CM | POA: Diagnosis not present

## 2021-04-28 DIAGNOSIS — Z7984 Long term (current) use of oral hypoglycemic drugs: Secondary | ICD-10-CM | POA: Diagnosis not present

## 2021-04-28 DIAGNOSIS — F17210 Nicotine dependence, cigarettes, uncomplicated: Secondary | ICD-10-CM | POA: Diagnosis not present

## 2021-04-28 DIAGNOSIS — N179 Acute kidney failure, unspecified: Secondary | ICD-10-CM | POA: Diagnosis not present

## 2021-04-28 DIAGNOSIS — E876 Hypokalemia: Secondary | ICD-10-CM | POA: Diagnosis not present

## 2021-04-28 DIAGNOSIS — Z9181 History of falling: Secondary | ICD-10-CM | POA: Diagnosis not present

## 2021-04-29 ENCOUNTER — Ambulatory Visit (INDEPENDENT_AMBULATORY_CARE_PROVIDER_SITE_OTHER): Payer: Medicare Other | Admitting: Urology

## 2021-04-29 ENCOUNTER — Encounter: Payer: Self-pay | Admitting: Urology

## 2021-04-29 ENCOUNTER — Other Ambulatory Visit: Payer: Self-pay

## 2021-04-29 VITALS — BP 103/62 | HR 70

## 2021-04-29 DIAGNOSIS — R339 Retention of urine, unspecified: Secondary | ICD-10-CM | POA: Diagnosis not present

## 2021-04-29 DIAGNOSIS — Z87440 Personal history of urinary (tract) infections: Secondary | ICD-10-CM

## 2021-04-29 MED ORDER — SULFAMETHOXAZOLE-TRIMETHOPRIM 800-160 MG PO TABS: 1.0000 | ORAL_TABLET | Freq: Two times a day (BID) | ORAL | 0 refills | Status: AC

## 2021-04-29 NOTE — Progress Notes (Signed)
? ?Assessment: ?1. Urinary retention   ?2. History of UTI   ? ? ?Plan: ?Given the findings on cystoscopy, his urinary retention does not appear to be secondary to bladder outlet obstruction.  He may have a hypotonic bladder or may have retention secondary to his multiple medications and/or constipation. ?Continue management of constipation ?Continue Rapaflo 8 mg daily  ?Bactrim DS BID x 5 days. Rx sent. ?Recommend further evaluation with urodynamics at [facility] ?Return to office after urodynamics completed ?I discussed the findings and recommendation with the patient and his nephew.  They would like to let him try to void at home today rather than place a Foley catheter this morning.  They know to return to the office later today if he is unable to void.  I asked them to contact the office with his status later today. ? ? ?Chief Complaint:  ?Chief Complaint  ?Patient presents with  ? Urinary Retention  ? ? ?History of Present Illness: ? ?[name] is a 66 y.o. year old male who is seen for further evaluation of urinary retention.  He presented to [facility] on 03/17/2021 with altered mental status.  He does have a history of schizophrenia.  He was found to have a distended abdomen on presentation.  A Foley catheter was placed with return of 2300 mL of urine.  CT imaging from 03/18/2021 demonstrated no stones or hydronephrosis, no renal masses, and a large stool burden throughout the colon.  He was started on Flomax.  He was treated for constipation.  His creatinine was 2.69 on admission and improved to 0.83 prior to discharge on 03/20/2021. ?At his initial visit on 03/27/2021, his Foley catheter was draining well.  He continued to have problems with constipation.  His nephew reported that he had not had a bowel movement in the past week.  He was taking MiraLAX and Senokot S.  No prior history of retention. ?His nephew reports that he has been pulling at the Foley catheter during periods of confusion. ?A  voiding trial was attempted on 03/27/2021 but was unsuccessful and the catheter was replaced. ?The patient returned for another voiding trial on 04/06/2021.  He was unable to void in the office and returned later that day unable to void requiring replacement of a Foley catheter. ? ?At his visit on 04/13/21, he continued with a foley catheter.  He continued to have issues with constipation although he had multiple bowel movements approximately 4 days prior.  No recent bowel movement.  He continues on MiraLAX and Senokot.  He also continued to take Flomax daily. ?He was continued with the Foley catheter.  He was changed to Rapaflo 8 mg daily in place of tamsulosin. ?Urine culture grew >100 K MRSA.  He was treated with Bactrim. ? ?He continues on Bactrim and Rapaflo.  His catheter has been draining well.  No gross hematuria.  No fevers or chills.   ?He presents today for cystoscopy and voiding trial. ?His catheter has been draining well. ? ?Portions of the above documentation were copied from a prior visit for review purposes only. ? ?Past Medical History:  ?Past Medical History:  ?Diagnosis Date  ? Schizophrenia (HCC)   ? ? ?Past Surgical History:  ?Past Surgical History:  ?Procedure Laterality Date  ? SHOULDER SURGERY    ? ? ?Allergies:  ?No Known Allergies ? ?Family History:  ?No family history on file. ? ?Social History:  ?Social History  ? ?Tobacco Use  ? Smoking status: Every  Day  ?  Packs/day: 1.00  ?  Types: Cigarettes  ?Substance Use Topics  ? Alcohol use: Yes  ?  Comment: occasionaly  ? Drug use: No  ? ? ?ROS: ?Constitutional:  Negative for fever, chills, weight loss ?CV: Negative for chest pain, previous MI, hypertension ?Respiratory:  Negative for shortness of breath, wheezing, sleep apnea, frequent cough ?GI:  Negative for nausea, vomiting, bloody stool, GERD ? ?Physical exam: ?BP 103/62   Pulse 70  ?GENERAL APPEARANCE:  Well appearing, well developed, well nourished, NAD ?HEENT:  Atraumatic, normocephalic,  oropharynx clear ?NECK:  Supple without lymphadenopathy or thyromegaly ?ABDOMEN:  Soft, non-tender, no masses ?EXTREMITIES:  Moves all extremities well, without clubbing, cyanosis, or edema ?NEUROLOGIC:  Alert and oriented x 3, in wheelchair, CN II-XII grossly intact ?MENTAL STATUS:  appropriate ?BACK:  Non-tender to palpation, No CVAT ?SKIN:  Warm, dry, and intact ?GU:  foley draining yellow urine ? ?Results: ?None ? ?Procedure:  Flexible Cystourethroscopy ? ?Pre-operative Diagnosis:  Urinary retention ? ?Post-operative Diagnosis:  Urinary retention ? ?Anesthesia:  local with lidocaine jelly ? ?Surgical Narrative: ? ?After appropriate informed consent was obtained, the patient was prepped and draped in the usual sterile fashion in the supine position.  The patient was correctly identified and the proper procedure delineated prior to proceeding.  Sterile lidocaine gel was instilled in the urethra. ?The flexible cystoscope was introduced without difficulty. ? ?Findings: ? ?Anterior urethra: distal erosion  ? ?Posterior urethra:  no significant enlargement of prostate ? ?Bladder:  large capacity bladder with few trabeculations; mucosal edema on posterior bladder wall consistent with indwelling foley ? ?Ureteral orifices:  left patulous, unable to visualize right ? ?Additional findings: none ? ?Saline bladder wash for cytology was not performed.   ? ?The cystoscope was then removed.  The patient tolerated the procedure well. ?Patient filled with 500 ml of NS during procedure. ? ?He was unable to void. ? ? ?

## 2021-04-29 NOTE — Progress Notes (Signed)
Catheter Removal ? ?Patient is present today for a catheter removal.  38ml of water was drained from the balloon. A 16FR foley cath was removed from the bladder no complications were noted . Patient tolerated well. ? ?Performed by: Guss Bunde CMA ? ?Follow up/ Additional notes: Pt had cystoscopy with MD, 500cc of sterile saline was instilled.  Pt given time to void, unable Simple Catheter Placement ? ?Due to urinary retention patient is present today for a foley cath placement.  Patient was cleaned and prepped in a sterile fashion with betadine. A 16 FR foley catheter was inserted, urine return was noted  , urine was yellow in color.  The balloon was filled with 10cc of sterile water.  A bedside bag was attached for drainage. Patient was also given a night bag to take home and was given instruction on how to change from one bag to another.  Patient was given instruction on proper catheter care.  Patient tolerated well, no complications were noted  ? ?Performed by: Guss Bunde CMA ? ?Additional notes/ Follow up: MD to see   ?

## 2021-04-29 NOTE — Progress Notes (Signed)
Simple Catheter Placement ? ?Due to urinary retention patient is present today for a foley cath placement.  Patient was cleaned and prepped in a sterile fashion with betadine. A 16 FR foley catheter was inserted, urine return was noted  , urine was yellow in color.  The balloon was filled with 10cc of sterile water.  A bedside bag was attached for drainage.  Patient was given instruction on proper catheter care.  Patient tolerated well, no complications were noted  ? ?Performed by: Lemont Sitzmann LPN ? ?Additional notes/ Follow up: After urodynamics ?

## 2021-04-30 DIAGNOSIS — D692 Other nonthrombocytopenic purpura: Secondary | ICD-10-CM | POA: Diagnosis not present

## 2021-04-30 DIAGNOSIS — I10 Essential (primary) hypertension: Secondary | ICD-10-CM | POA: Diagnosis not present

## 2021-04-30 DIAGNOSIS — Z6824 Body mass index (BMI) 24.0-24.9, adult: Secondary | ICD-10-CM | POA: Diagnosis not present

## 2021-04-30 DIAGNOSIS — E871 Hypo-osmolality and hyponatremia: Secondary | ICD-10-CM | POA: Diagnosis not present

## 2021-04-30 DIAGNOSIS — Z299 Encounter for prophylactic measures, unspecified: Secondary | ICD-10-CM | POA: Diagnosis not present

## 2021-05-01 DIAGNOSIS — R339 Retention of urine, unspecified: Secondary | ICD-10-CM | POA: Diagnosis not present

## 2021-05-01 DIAGNOSIS — F17210 Nicotine dependence, cigarettes, uncomplicated: Secondary | ICD-10-CM | POA: Diagnosis not present

## 2021-05-01 DIAGNOSIS — Z7984 Long term (current) use of oral hypoglycemic drugs: Secondary | ICD-10-CM | POA: Diagnosis not present

## 2021-05-01 DIAGNOSIS — E876 Hypokalemia: Secondary | ICD-10-CM | POA: Diagnosis not present

## 2021-05-01 DIAGNOSIS — N179 Acute kidney failure, unspecified: Secondary | ICD-10-CM | POA: Diagnosis not present

## 2021-05-01 DIAGNOSIS — Z791 Long term (current) use of non-steroidal anti-inflammatories (NSAID): Secondary | ICD-10-CM | POA: Diagnosis not present

## 2021-05-01 DIAGNOSIS — E871 Hypo-osmolality and hyponatremia: Secondary | ICD-10-CM | POA: Diagnosis not present

## 2021-05-01 DIAGNOSIS — Z9181 History of falling: Secondary | ICD-10-CM | POA: Diagnosis not present

## 2021-05-01 DIAGNOSIS — Z466 Encounter for fitting and adjustment of urinary device: Secondary | ICD-10-CM | POA: Diagnosis not present

## 2021-05-01 DIAGNOSIS — K5900 Constipation, unspecified: Secondary | ICD-10-CM | POA: Diagnosis not present

## 2021-05-01 DIAGNOSIS — E079 Disorder of thyroid, unspecified: Secondary | ICD-10-CM | POA: Diagnosis not present

## 2021-05-01 DIAGNOSIS — E119 Type 2 diabetes mellitus without complications: Secondary | ICD-10-CM | POA: Diagnosis not present

## 2021-05-01 DIAGNOSIS — Z993 Dependence on wheelchair: Secondary | ICD-10-CM | POA: Diagnosis not present

## 2021-05-04 DIAGNOSIS — N179 Acute kidney failure, unspecified: Secondary | ICD-10-CM | POA: Diagnosis not present

## 2021-05-04 DIAGNOSIS — Z466 Encounter for fitting and adjustment of urinary device: Secondary | ICD-10-CM | POA: Diagnosis not present

## 2021-05-04 DIAGNOSIS — Z9181 History of falling: Secondary | ICD-10-CM | POA: Diagnosis not present

## 2021-05-04 DIAGNOSIS — E119 Type 2 diabetes mellitus without complications: Secondary | ICD-10-CM | POA: Diagnosis not present

## 2021-05-04 DIAGNOSIS — F17210 Nicotine dependence, cigarettes, uncomplicated: Secondary | ICD-10-CM | POA: Diagnosis not present

## 2021-05-04 DIAGNOSIS — Z791 Long term (current) use of non-steroidal anti-inflammatories (NSAID): Secondary | ICD-10-CM | POA: Diagnosis not present

## 2021-05-04 DIAGNOSIS — E871 Hypo-osmolality and hyponatremia: Secondary | ICD-10-CM | POA: Diagnosis not present

## 2021-05-04 DIAGNOSIS — Z7984 Long term (current) use of oral hypoglycemic drugs: Secondary | ICD-10-CM | POA: Diagnosis not present

## 2021-05-04 DIAGNOSIS — K5900 Constipation, unspecified: Secondary | ICD-10-CM | POA: Diagnosis not present

## 2021-05-04 DIAGNOSIS — R339 Retention of urine, unspecified: Secondary | ICD-10-CM | POA: Diagnosis not present

## 2021-05-04 DIAGNOSIS — E079 Disorder of thyroid, unspecified: Secondary | ICD-10-CM | POA: Diagnosis not present

## 2021-05-04 DIAGNOSIS — Z993 Dependence on wheelchair: Secondary | ICD-10-CM | POA: Diagnosis not present

## 2021-05-04 DIAGNOSIS — E876 Hypokalemia: Secondary | ICD-10-CM | POA: Diagnosis not present

## 2021-05-07 DIAGNOSIS — E119 Type 2 diabetes mellitus without complications: Secondary | ICD-10-CM | POA: Diagnosis not present

## 2021-05-07 DIAGNOSIS — Z993 Dependence on wheelchair: Secondary | ICD-10-CM | POA: Diagnosis not present

## 2021-05-07 DIAGNOSIS — Z466 Encounter for fitting and adjustment of urinary device: Secondary | ICD-10-CM | POA: Diagnosis not present

## 2021-05-07 DIAGNOSIS — R339 Retention of urine, unspecified: Secondary | ICD-10-CM | POA: Diagnosis not present

## 2021-05-07 DIAGNOSIS — E039 Hypothyroidism, unspecified: Secondary | ICD-10-CM | POA: Diagnosis not present

## 2021-05-07 DIAGNOSIS — Z9181 History of falling: Secondary | ICD-10-CM | POA: Diagnosis not present

## 2021-05-07 DIAGNOSIS — F17210 Nicotine dependence, cigarettes, uncomplicated: Secondary | ICD-10-CM | POA: Diagnosis not present

## 2021-05-07 DIAGNOSIS — N179 Acute kidney failure, unspecified: Secondary | ICD-10-CM | POA: Diagnosis not present

## 2021-05-07 DIAGNOSIS — Z791 Long term (current) use of non-steroidal anti-inflammatories (NSAID): Secondary | ICD-10-CM | POA: Diagnosis not present

## 2021-05-07 DIAGNOSIS — E871 Hypo-osmolality and hyponatremia: Secondary | ICD-10-CM | POA: Diagnosis not present

## 2021-05-07 DIAGNOSIS — Z7984 Long term (current) use of oral hypoglycemic drugs: Secondary | ICD-10-CM | POA: Diagnosis not present

## 2021-05-07 DIAGNOSIS — I10 Essential (primary) hypertension: Secondary | ICD-10-CM | POA: Diagnosis not present

## 2021-05-07 DIAGNOSIS — E876 Hypokalemia: Secondary | ICD-10-CM | POA: Diagnosis not present

## 2021-05-07 DIAGNOSIS — E079 Disorder of thyroid, unspecified: Secondary | ICD-10-CM | POA: Diagnosis not present

## 2021-05-07 DIAGNOSIS — R220 Localized swelling, mass and lump, head: Secondary | ICD-10-CM | POA: Diagnosis not present

## 2021-05-07 DIAGNOSIS — K5900 Constipation, unspecified: Secondary | ICD-10-CM | POA: Diagnosis not present

## 2021-05-25 DIAGNOSIS — R338 Other retention of urine: Secondary | ICD-10-CM | POA: Diagnosis not present

## 2021-06-17 ENCOUNTER — Ambulatory Visit (INDEPENDENT_AMBULATORY_CARE_PROVIDER_SITE_OTHER): Payer: Medicare Other | Admitting: Urology

## 2021-06-17 ENCOUNTER — Encounter: Payer: Self-pay | Admitting: Urology

## 2021-06-17 ENCOUNTER — Encounter: Payer: Self-pay | Admitting: *Deleted

## 2021-06-17 VITALS — BP 118/77 | HR 94

## 2021-06-17 DIAGNOSIS — Z87440 Personal history of urinary (tract) infections: Secondary | ICD-10-CM | POA: Diagnosis not present

## 2021-06-17 DIAGNOSIS — N312 Flaccid neuropathic bladder, not elsewhere classified: Secondary | ICD-10-CM | POA: Diagnosis not present

## 2021-06-17 DIAGNOSIS — R339 Retention of urine, unspecified: Secondary | ICD-10-CM | POA: Diagnosis not present

## 2021-06-17 NOTE — Progress Notes (Signed)
? ?Assessment: ?1. Hypotonic bladder   ?2. Urinary retention   ?3. History of UTI   ? ? ?Plan: ?I reviewed the urodynamic results with Mr. [name] and his nephew today.  These findings suggest a hypotonic bladder other than significant bladder outlet obstruction.  This coincides with the findings on cystoscopy. ?I discussed options for management including continued Foley catheter drainage, intermittent catheterization, and SP tube placement.  He is not a candidate for intermittent catheterization.  The patient and his nephew are interested in a trial of a suprapubic tube. ?We will make arrangements for placement by interventional radiology. ?Continue management of constipation ?Continue Rapaflo 8 mg daily  ?Return to office approximately 4 weeks after SP tube placement. ?Urine culture sent today ? ?Chief Complaint:  ?Chief Complaint  ?Patient presents with  ? Urinary Retention  ? ? ?History of Present Illness: ? ?[name] is a 66 y.o. year old male who is seen for further evaluation of urinary retention.  He presented to [facility] on 03/17/2021 with altered mental status.  He does have a history of schizophrenia.  He was found to have a distended abdomen on presentation.  A Foley catheter was placed with return of 2300 mL of urine.  CT imaging from 03/18/2021 demonstrated no stones or hydronephrosis, no renal masses, and a large stool burden throughout the colon.  He was started on Flomax.  He was treated for constipation.  His creatinine was 2.69 on admission and improved to 0.83 prior to discharge on 03/20/2021. ?At his initial visit on 03/27/2021, his Foley catheter was draining well.  He continued to have problems with constipation.  He was taking MiraLAX and Senokot S.  No prior history of retention. ?His nephew reports that he has been pulling at the Foley catheter during periods of confusion. ?A voiding trial was attempted on 03/27/2021 but was unsuccessful and the catheter was replaced. ?The patient  returned for another voiding trial on 04/06/2021.  He was unable to void in the office and returned later that day unable to void requiring replacement of a Foley catheter. ? ?At his visit on 04/13/21, he continued with a foley catheter.  He continued to have issues with constipation although he had multiple bowel movements approximately 4 days prior.  No recent bowel movement.  He continues on MiraLAX and Senokot.  He also continued to take Flomax daily. ?He was continued with the Foley catheter.  He was changed to Rapaflo 8 mg daily in place of tamsulosin. ?Urine culture grew >100 K MRSA.  He was treated with Bactrim. ? ?He was evaluated with cystoscopy on 04/29/2021.  Cystoscopy showed no significant enlargement of the prostate, a large capacity bladder with few trabeculations, and mucosal edema consistent with chronic Foley catheter irritation.  He was unable to void following the procedure. ?He underwent evaluation with urodynamics on 05/25/2021.  The patient had a bladder capacity of 987 mL with decreased sensation.  There was instability noted with leakage.  The patient was able to generate a slight voluntary contraction and void a small amount with straining.  Max detrusor pressure was 20 cm of water with a max flow of 5 mL/s. ? ?Portions of the above documentation were copied from a prior visit for review purposes only. ? ?Past Medical History:  ?Past Medical History:  ?Diagnosis Date  ? Schizophrenia (HCC)   ? ? ?Past Surgical History:  ?Past Surgical History:  ?Procedure Laterality Date  ? SHOULDER SURGERY    ? ? ?Allergies:  ?  No Known Allergies ? ?Family History:  ?No family history on file. ? ?Social History:  ?Social History  ? ?Tobacco Use  ? Smoking status: Every Day  ?  Packs/day: 1.00  ?  Types: Cigarettes  ?Substance Use Topics  ? Alcohol use: Yes  ?  Comment: occasionaly  ? Drug use: No  ? ? ?ROS: ?Constitutional:  Negative for fever, chills, weight loss ?CV: Negative for chest pain, previous MI,  hypertension ?Respiratory:  Negative for shortness of breath, wheezing, sleep apnea, frequent cough ?GI:  Negative for nausea, vomiting, bloody stool, GERD ? ?Physical exam: ?BP 118/77   Pulse 94  ?GENERAL APPEARANCE:  Well appearing, well developed, well nourished, NAD ?HEENT:  Atraumatic, normocephalic, oropharynx clear ?NECK:  Supple without lymphadenopathy or thyromegaly ?ABDOMEN:  Soft, non-tender, no masses ?EXTREMITIES:  Moves all extremities well, without clubbing, cyanosis, or edema ?NEUROLOGIC:  Alert and oriented x 3, in wheelchair, CN II-XII grossly intact ?MENTAL STATUS:  appropriate ?BACK:  Non-tender to palpation, No CVAT ?SKIN:  Warm, dry, and intact ? ?Results: ?None ? ?

## 2021-06-17 NOTE — Progress Notes (Unsigned)
Clifford Balm, MD  [name] ?Ok  ? ?CT suprapubic cath  ?Must have Foley in place before procedure  ? ?DDH   ?  ?   ?Previous Messages ?  ?----- Message -----  ?From: [name]  ?Sent: 06/17/2021   1:16 PM EDT  ?To: Ir Procedure Requests  ?Subject: CT Cath placement                              ? ? ? ?Procedure:  CT Cath placement  ? ?Reason:  Hypotonic bladder// Consult IR for CT guided placement of suprapubic catheter  ? ?History: CT done @ [facility] 04/23/2021  ? ?Provider:  Di Kindle  ? ?Provider Contact: [phone]   ?

## 2021-06-21 LAB — URINE CULTURE

## 2021-06-22 ENCOUNTER — Other Ambulatory Visit: Payer: Self-pay | Admitting: Student

## 2021-06-23 ENCOUNTER — Other Ambulatory Visit: Payer: Self-pay

## 2021-06-23 ENCOUNTER — Ambulatory Visit (HOSPITAL_COMMUNITY)
Admission: RE | Admit: 2021-06-23 | Discharge: 2021-06-23 | Disposition: A | Discharge status: Home / Self Care | Payer: Medicare Other | Source: Ambulatory Visit | Attending: Urology | Admitting: Urology

## 2021-06-23 ENCOUNTER — Encounter (HOSPITAL_COMMUNITY): Payer: Self-pay

## 2021-06-23 DIAGNOSIS — Z435 Encounter for attention to cystostomy: Secondary | ICD-10-CM | POA: Diagnosis not present

## 2021-06-23 DIAGNOSIS — N320 Bladder-neck obstruction: Secondary | ICD-10-CM | POA: Insufficient documentation

## 2021-06-23 DIAGNOSIS — N312 Flaccid neuropathic bladder, not elsewhere classified: Secondary | ICD-10-CM | POA: Diagnosis not present

## 2021-06-23 DIAGNOSIS — F209 Schizophrenia, unspecified: Secondary | ICD-10-CM | POA: Insufficient documentation

## 2021-06-23 DIAGNOSIS — R339 Retention of urine, unspecified: Secondary | ICD-10-CM | POA: Diagnosis not present

## 2021-06-23 DIAGNOSIS — N400 Enlarged prostate without lower urinary tract symptoms: Secondary | ICD-10-CM | POA: Insufficient documentation

## 2021-06-23 LAB — CBC
HCT: 33.4 % — ABNORMAL LOW (ref 39.0–52.0)
Hemoglobin: 11 g/dL — ABNORMAL LOW (ref 13.0–17.0)
MCH: 28.8 pg (ref 26.0–34.0)
MCHC: 32.9 g/dL (ref 30.0–36.0)
MCV: 87.4 fL (ref 80.0–100.0)
Platelets: 206 10*3/uL (ref 150–400)
RBC: 3.82 MIL/uL — ABNORMAL LOW (ref 4.22–5.81)
RDW: 14.7 % (ref 11.5–15.5)
WBC: 6.1 10*3/uL (ref 4.0–10.5)
nRBC: 0 % (ref 0.0–0.2)

## 2021-06-23 LAB — PROTIME-INR
INR: 1.2 (ref 0.8–1.2)
Prothrombin Time: 14.8 seconds (ref 11.4–15.2)

## 2021-06-23 LAB — GLUCOSE, CAPILLARY: Glucose-Capillary: 131 mg/dL — ABNORMAL HIGH (ref 70–99)

## 2021-06-23 MED ORDER — FENTANYL CITRATE (PF) 100 MCG/2ML IJ SOLN
INTRAMUSCULAR | Status: AC | PRN
Start: 2021-06-23 — End: 2021-06-23
  Administered 2021-06-23: 50 ug via INTRAVENOUS

## 2021-06-23 MED ORDER — LIDOCAINE HCL 1 % IJ SOLN
INTRAMUSCULAR | Status: AC
  Filled 2021-06-23: qty 10

## 2021-06-23 MED ORDER — SODIUM CHLORIDE 0.9% FLUSH: 5.0000 mL | Freq: Three times a day (TID) | INTRAVENOUS | Status: DC

## 2021-06-23 MED ORDER — SODIUM CHLORIDE 0.9 % IV SOLN: INTRAVENOUS | Status: DC

## 2021-06-23 MED ORDER — MIDAZOLAM HCL 2 MG/2ML IJ SOLN
INTRAMUSCULAR | Status: AC
  Filled 2021-06-23: qty 2

## 2021-06-23 MED ORDER — FENTANYL CITRATE (PF) 100 MCG/2ML IJ SOLN
INTRAMUSCULAR | Status: AC
  Filled 2021-06-23: qty 2

## 2021-06-23 MED ORDER — MIDAZOLAM HCL 2 MG/2ML IJ SOLN
INTRAMUSCULAR | Status: AC | PRN
  Administered 2021-06-23: 1 mg via INTRAVENOUS

## 2021-06-23 NOTE — H&P (Signed)
Chief Complaint: Patient was seen in consultation today for suprapubic catheter placement  at the request of Stoneking,Bradley J.  Referring Physician(s): Stoneking,Bradley J.  Supervising Physician: Gilmer Mor  Patient Status: [facility] - Out-pt  History of Present Illness: [name] is a 66 y.o. male with medical history significant for schizophrenia, urinary retention, hypotonic bladder and bladder outlet obstruction.  Patient was seen at [facility] on 03/17/2021 with altered mental status.  Due to distended abdomen Foley catheter was placed with return of 2300 mL of urine.  Patient was subsequently discharged with Foley catheter.  A voiding trial was attempted 03/27/2021 but unsuccessful and catheter was replaced.  Patient had cystoscopy 04/29/2021 that showed significant enlargement of the prostate, large capacity bladder and mucosal edema consistent with chronic Foley catheter irritation.  Dr. Di Kindle has referred patient to IR for suprapubic catheter placement with moderate sedation.  Procedure was approved by Dr. Oley Balm, IR.  Past Medical History:  Diagnosis Date   Schizophrenia [facility])     Past Surgical History:  Procedure Laterality Date   SHOULDER SURGERY      Allergies: Patient has no known allergies.  Medications: Prior to Admission medications   Medication Sig Start Date End Date Taking? Authorizing Provider  benztropine (COGENTIN) 2 MG tablet Take 1 tablet (2 mg total) by mouth at bedtime. Patient taking differently: Take 2 mg by mouth 2 (two) times daily. 09/28/12  Yes Thermon Leyland, NP  clindamycin (CLEOCIN) 300 MG capsule Take 300 mg by mouth 4 (four) times daily. 04/16/21  Yes [provider]  clonazePAM (KLONOPIN) 1 MG tablet Take 1 tablet (1 mg total) by mouth at bedtime. 09/28/12  Yes Thermon Leyland, NP  donepezil (ARICEPT) 5 MG tablet Take 5 mg by mouth daily. 04/17/21  Yes [provider]  levothyroxine (SYNTHROID,  LEVOTHROID) 75 MCG tablet Take 1 tablet (75 mcg total) by mouth daily. 09/28/12  Yes Thermon Leyland, NP  meloxicam (MOBIC) 15 MG tablet Take 15 mg by mouth daily. 04/10/21  Yes [provider]  metFORMIN (GLUCOPHAGE-XR) 500 MG 24 hr tablet Take 1 tablet by mouth 2 (two) times daily. 03/13/21  Yes [provider]  omeprazole (PRILOSEC) 20 MG capsule Take 1 capsule (20 mg total) by mouth daily. 09/28/12  Yes Thermon Leyland, NP  paliperidone (INVEGA) 3 MG 24 hr tablet Take 1 tablet by mouth 2 (two) times daily. 03/13/21  Yes [provider]  polyethylene glycol (MIRALAX / GLYCOLAX) 17 g packet Take 17 g by mouth 2 (two) times daily. 03/20/21  Yes Haydee Monica, MD  QUEtiapine (SEROQUEL) 200 MG tablet Take 600 mg by mouth at bedtime. 03/13/21  Yes [provider]  rosuvastatin (CRESTOR) 5 MG tablet Take 5 mg by mouth once a week. 03/13/21  Yes [provider]  silodosin (RAPAFLO) 8 MG CAPS capsule Take 1 capsule (8 mg total) by mouth daily. 04/13/21  Yes Stoneking, Danford Bad., MD  traZODone (DESYREL) 100 MG tablet Take 2 tablets (200 mg total) by mouth at bedtime. For sleep. 09/28/12  Yes Thermon Leyland, NP  paliperidone (INVEGA SUSTENNA) 234 MG/1.5ML SUSY injection Inject 1 mL into the muscle every 30 (thirty) days. 01/23/21   [provider]  senna-docusate (SENOKOT-S) 8.6-50 MG tablet Take 1 tablet by mouth at bedtime as needed for mild constipation. 03/20/21   Haydee Monica, MD  terbinafine (LAMISIL) 1 % cream Apply topically 2 (two) times daily. 04/16/21   [provider]     No family history on file.  Social History   Socioeconomic History   Marital status: Single    Spouse name: Not on file   Number of children: Not on file   Years of education: Not on file   Highest education level: Not on file  Occupational History   Not on file  Tobacco Use   Smoking status: Every Day    Packs/day: 1.00    Types: Cigarettes   Smokeless tobacco:  Not on file  Substance and Sexual Activity   Alcohol use: Yes    Comment: occasionaly   Drug use: No   Sexual activity: Not on file  Other Topics Concern   Not on file  Social History Narrative   Not on file   Social Determinants of Health   Financial Resource Strain: Not on file  Food Insecurity: Not on file  Transportation Needs: Not on file  Physical Activity: Not on file  Stress: Not on file  Social Connections: Not on file     Review of Systems: A 12 point ROS discussed and pertinent positives are indicated in the HPI above.  All other systems are negative.  Review of Systems  All other systems reviewed and are negative.  Vital Signs: BP (!) 152/82   Pulse (!) 116   Temp 97.8 F (36.6 C) (Oral)   Resp 17   Ht 5\' 9"  (1.753 m)   Wt 194 lb (88 kg)   SpO2 97%   BMI 28.65 kg/m   Physical Exam Constitutional:      General: He is not in acute distress.    Appearance: Normal appearance. He is not ill-appearing.  HENT:     Head: Normocephalic and atraumatic.     Mouth/Throat:     Mouth: Mucous membranes are dry.     Pharynx: Oropharynx is clear.  Eyes:     Extraocular Movements: Extraocular movements intact.     Pupils: Pupils are equal, round, and reactive to light.  Cardiovascular:     Rate and Rhythm: Regular rhythm. Tachycardia present.     Pulses: Normal pulses.  Pulmonary:     Effort: Pulmonary effort is normal. No respiratory distress.     Breath sounds: Normal breath sounds.  Abdominal:     General: Bowel sounds are normal. There is no distension.     Palpations: Abdomen is soft.     Tenderness: There is no abdominal tenderness. There is no guarding.  Musculoskeletal:     Right lower leg: No edema.     Left lower leg: No edema.  Skin:    General: Skin is warm and dry.  Neurological:     Mental Status: He is alert and oriented to person, place, and time.  Psychiatric:        Mood and Affect: Mood normal.        Behavior: Behavior normal.         Thought Content: Thought content normal.        Judgment: Judgment normal.    Imaging: No results found.  Labs:  CBC: Recent Labs    03/17/21 2213 03/18/21 0610 03/19/21 0605 03/20/21 0523  WBC 8.4 7.5 5.9 6.3  HGB 11.6* 10.7* 10.8* 11.7*  HCT 34.0* 31.8* 31.7* 35.2*  PLT 230 205 214 251    COAGS: No results for input(s): INR, APTT in the last 8760 hours.  BMP: Recent Labs    03/17/21 2213 03/18/21 9147 03/19/21 8295 03/20/21 6213  NA 128* 133* 139 138  K 2.9* 3.6 5.1 5.2*  CL 97* 102 107 99  CO2 20* 19* 24 26  GLUCOSE 143* 124* 135* 142*  BUN 35* 34* 16 12  CALCIUM 8.6* 8.4* 8.8* 9.5  CREATININE 2.69* 2.04* 0.98 0.83  GFRNONAA 26* 36* >60 >60    LIVER FUNCTION TESTS: Recent Labs    03/17/21 2213 03/18/21 0610  BILITOT 1.0 1.1  AST 19 18  ALT 18 18  ALKPHOS 78 66  PROT 8.0 6.9  ALBUMIN 4.0 3.4*    TUMOR MARKERS: No results for input(s): AFPTM, CEA, CA199, CHROMGRNA in the last 8760 hours.  Assessment and Plan: History of medical history significant for schizophrenia, urinary retention, hypotonic bladder and bladder outlet obstruction.  Patient was seen at [facility] on 03/17/2021 with altered mental status.  Due to distended abdomen Foley catheter was placed with return of 2300 mL of urine.  Patient was subsequently discharged with Foley catheter.  A voiding trial was attempted 03/27/2021 but unsuccessful and catheter was replaced.  Patient had cystoscopy 04/29/2021 that showed significant enlargement of the prostate, large capacity bladder and mucosal edema consistent with chronic Foley catheter irritation.  Dr. Di Kindle has referred patient to IR for suprapubic catheter placement with moderate sedation.  Procedure was approved by Dr. Oley Balm, IR.  Pt resting on stretcher. He is A&O, calm and pleasant. He is in no distress. Pt nephew (legal guardian) at bedside.  Pt is NPO per order. Pt does not take AC/AP  Risks and benefits of  suprapubic catheter placement discussed with the patient including bleeding, infection, damage to adjacent structures and sepsis.  All of the patient's questions were answered, patient is agreeable to proceed. Consent signed and in chart.   Thank you for this interesting consult.  I greatly enjoyed meeting BINYAMIN KLYM and look forward to participating in their care.  A copy of this report was sent to the requesting provider on this date.  Electronically Signed: Shon Hough, NP 06/23/2021, 8:17 AM   I spent a total of 20 minutes in face to face in clinical consultation, greater than 50% of which was counseling/coordinating care for suprapubic catheter placement.

## 2021-06-23 NOTE — Procedures (Signed)
Interventional Radiology Procedure Note ? ?Procedure:  ?Ct guided supra-pubic catheter.  ?71F thal.  To gravity ?  ?Complications: None ?Recommendations:  ?- Can exchange for 71F foley after 4-6 weeks healing ?- Do not submerge for 7 days ?- 90 minutes dc home ?- advance diet ? ?Signed, ? ?Dulcy Fanny. Earleen Newport, DO ? ? ?

## 2021-07-14 ENCOUNTER — Telehealth: Payer: Self-pay

## 2021-07-14 NOTE — Telephone Encounter (Signed)
  Gillian Shields called to check on what is next for patient.  Wants to know if he needs to be scheduled for any routine appointments.  Please advise.  Call back:  [phone]  Thanks, Helene Kelp

## 2021-07-15 NOTE — Telephone Encounter (Signed)
Per Dr. Carlyle Lipa last office note, scheduled pt for f/u after sp tube placement on 07/24/2021.  Patient aware and reminder letter sent.

## 2021-07-24 ENCOUNTER — Ambulatory Visit (INDEPENDENT_AMBULATORY_CARE_PROVIDER_SITE_OTHER): Payer: Medicare Other | Admitting: Urology

## 2021-07-24 ENCOUNTER — Encounter: Payer: Self-pay | Admitting: Urology

## 2021-07-24 VITALS — BP 105/68 | HR 103 | Ht 69.0 in | Wt 194.0 lb

## 2021-07-24 DIAGNOSIS — N312 Flaccid neuropathic bladder, not elsewhere classified: Secondary | ICD-10-CM

## 2021-07-24 DIAGNOSIS — R339 Retention of urine, unspecified: Secondary | ICD-10-CM

## 2021-07-24 DIAGNOSIS — Z87440 Personal history of urinary (tract) infections: Secondary | ICD-10-CM | POA: Diagnosis not present

## 2021-07-24 NOTE — Progress Notes (Signed)
Assessment: 1. Urinary retention   2. Hypotonic bladder   3. History of UTI     Plan: Will arrange for IR to perform first SP tube change with Foley catheter in the next 1-2 weeks. Return to office approximately 1 month after SP tube change with IR  Chief Complaint:  Chief Complaint  Patient presents with   Urinary Retention    History of Present Illness:  [name] is a 66 y.o. year old male who is seen for further evaluation of urinary retention.  He presented to [facility] on 03/17/2021 with altered mental status.  He does have a history of schizophrenia.  He was found to have a distended abdomen on presentation.  A Foley catheter was placed with return of 2300 mL of urine.  CT imaging from 03/18/2021 demonstrated no stones or hydronephrosis, no renal masses, and a large stool burden throughout the colon.  He was started on Flomax.  He was treated for constipation.  His creatinine was 2.69 on admission and improved to 0.83 prior to discharge on 03/20/2021. At his initial visit on 03/27/2021, his Foley catheter was draining well.  He continued to have problems with constipation.  He was taking MiraLAX and Senokot S.  No prior history of retention. His nephew reports that he has been pulling at the Foley catheter during periods of confusion. A voiding trial was attempted on 03/27/2021 but was unsuccessful and the catheter was replaced. The patient returned for another voiding trial on 04/06/2021.  He was unable to void in the office and returned later that day unable to void requiring replacement of a Foley catheter.  At his visit on 04/13/21, he continued with a foley catheter.  He continued to have issues with constipation although he had multiple bowel movements approximately 4 days prior.  No recent bowel movement.  He continues on MiraLAX and Senokot.  He also continued to take Flomax daily. He was continued with the Foley catheter.  He was changed to Rapaflo 8 mg daily in place of  tamsulosin. Urine culture grew >100 K MRSA.  He was treated with Bactrim.  He was evaluated with cystoscopy on 04/29/2021.  Cystoscopy showed no significant enlargement of the prostate, a large capacity bladder with few trabeculations, and mucosal edema consistent with chronic Foley catheter irritation.  He was unable to void following the procedure. He underwent evaluation with urodynamics on 05/25/2021.  The patient had a bladder capacity of 987 mL with decreased sensation.  There was instability noted with leakage.  The patient was able to generate a slight voluntary contraction and void a small amount with straining.  Max detrusor pressure was 20 cm of water with a max flow of 5 mL/s.  He underwent placement of a suprapubic catheter by interventional radiology on 06/23/2021. The SP tube has been draining well.  No gross hematuria.  No fevers or chills.  He remains nonambulatory and is requiring increased level of care from family members.  Portions of the above documentation were copied from a prior visit for review purposes only.  Past Medical History:  Past Medical History:  Diagnosis Date   Schizophrenia [facility])     Past Surgical History:  Past Surgical History:  Procedure Laterality Date   SHOULDER SURGERY      Allergies:  No Known Allergies  Family History:  No family history on file.  Social History:  Social History   Tobacco Use   Smoking status: Every Day    Packs/day:  1.00    Types: Cigarettes  Substance Use Topics   Alcohol use: Yes    Comment: occasionaly   Drug use: No    ROS: Constitutional:  Negative for fever, chills, weight loss CV: Negative for chest pain, previous MI, hypertension Respiratory:  Negative for shortness of breath, wheezing, sleep apnea, frequent cough GI:  Negative for nausea, vomiting, bloody stool, GERD   Physical exam: BP 105/68   Pulse (!) 103   Ht 5\' 9"  (1.753 m)   Wt 194 lb (88 kg)   BMI 28.65 kg/m  GENERAL APPEARANCE:  Well  appearing, well developed, well nourished, NAD HEENT:  Atraumatic, normocephalic, oropharynx clear NECK:  Supple without lymphadenopathy or thyromegaly ABDOMEN:  Soft, non-tender, no masses; SP tube draining yellow urine, site intact EXTREMITIES:  Moves all extremities well, without clubbing, cyanosis, or edema NEUROLOGIC:  Alert and oriented x 3, chair, CN II-XII grossly intact MENTAL STATUS:  appropriate BACK:  Non-tender to palpation, No CVAT SKIN:  Warm, dry, and intact  Results: None

## 2021-07-31 ENCOUNTER — Other Ambulatory Visit: Payer: Self-pay | Admitting: Radiology

## 2021-08-03 ENCOUNTER — Inpatient Hospital Stay
Admission: AD | Admit: 2021-08-03 | Payer: Medicare Other | Source: Other Acute Inpatient Hospital | Admitting: Internal Medicine

## 2021-08-03 ENCOUNTER — Inpatient Hospital Stay (HOSPITAL_COMMUNITY): Admission: RE | Admit: 2021-08-03 | Payer: Medicare Other | Source: Ambulatory Visit

## 2021-08-03 DIAGNOSIS — T83098A Other mechanical complication of other indwelling urethral catheter, initial encounter: Secondary | ICD-10-CM | POA: Diagnosis not present

## 2021-08-03 DIAGNOSIS — T83091A Other mechanical complication of indwelling urethral catheter, initial encounter: Secondary | ICD-10-CM | POA: Diagnosis not present

## 2021-08-03 DIAGNOSIS — Z87891 Personal history of nicotine dependence: Secondary | ICD-10-CM | POA: Diagnosis not present

## 2021-08-03 DIAGNOSIS — R339 Retention of urine, unspecified: Secondary | ICD-10-CM | POA: Diagnosis not present

## 2021-08-03 DIAGNOSIS — I10 Essential (primary) hypertension: Secondary | ICD-10-CM | POA: Diagnosis not present

## 2021-08-03 DIAGNOSIS — E119 Type 2 diabetes mellitus without complications: Secondary | ICD-10-CM | POA: Diagnosis not present

## 2021-08-03 DIAGNOSIS — E039 Hypothyroidism, unspecified: Secondary | ICD-10-CM | POA: Diagnosis not present

## 2021-08-03 DIAGNOSIS — K219 Gastro-esophageal reflux disease without esophagitis: Secondary | ICD-10-CM | POA: Diagnosis not present

## 2021-08-04 ENCOUNTER — Other Ambulatory Visit (HOSPITAL_COMMUNITY): Payer: Self-pay | Admitting: Physician Assistant

## 2021-08-05 ENCOUNTER — Other Ambulatory Visit: Payer: Self-pay

## 2021-08-05 ENCOUNTER — Ambulatory Visit (HOSPITAL_COMMUNITY)
Admission: RE | Admit: 2021-08-05 | Discharge: 2021-08-05 | Disposition: A | Discharge status: Home / Self Care | Payer: Medicare Other | Source: Ambulatory Visit | Attending: Urology | Admitting: Urology

## 2021-08-05 DIAGNOSIS — R339 Retention of urine, unspecified: Secondary | ICD-10-CM | POA: Diagnosis not present

## 2021-08-05 DIAGNOSIS — Z435 Encounter for attention to cystostomy: Secondary | ICD-10-CM | POA: Insufficient documentation

## 2021-08-05 DIAGNOSIS — F209 Schizophrenia, unspecified: Secondary | ICD-10-CM | POA: Insufficient documentation

## 2021-08-05 DIAGNOSIS — N312 Flaccid neuropathic bladder, not elsewhere classified: Secondary | ICD-10-CM | POA: Insufficient documentation

## 2021-08-05 DIAGNOSIS — N99512 Cystostomy malfunction: Secondary | ICD-10-CM | POA: Diagnosis not present

## 2021-08-05 HISTORY — PX: IR CATHETER TUBE CHANGE: IMG717

## 2021-08-05 MED ORDER — SODIUM CHLORIDE 0.9% FLUSH: 5.0000 mL | Freq: Three times a day (TID) | INTRAVENOUS | Status: DC

## 2021-08-05 MED ORDER — FENTANYL CITRATE (PF) 100 MCG/2ML IJ SOLN
INTRAMUSCULAR | Status: AC | PRN
  Administered 2021-08-05 (×3): 25 ug via INTRAVENOUS

## 2021-08-05 MED ORDER — MIDAZOLAM HCL 2 MG/2ML IJ SOLN
INTRAMUSCULAR | Status: AC
  Filled 2021-08-05: qty 2

## 2021-08-05 MED ORDER — IOHEXOL 300 MG/ML  SOLN
100.0000 mL | Freq: Once | INTRAMUSCULAR | Status: AC | PRN
  Administered 2021-08-05: 10 mL

## 2021-08-05 MED ORDER — LIDOCAINE HCL 1 % IJ SOLN
INTRAMUSCULAR | Status: AC
  Filled 2021-08-05: qty 20

## 2021-08-05 MED ORDER — SODIUM CHLORIDE 0.9 % IV SOLN
INTRAVENOUS | Status: AC | PRN
  Administered 2021-08-05: 10 mL/h via INTRAVENOUS

## 2021-08-05 MED ORDER — MIDAZOLAM HCL 2 MG/2ML IJ SOLN
INTRAMUSCULAR | Status: AC | PRN
  Administered 2021-08-05 (×2): .5 mg via INTRAVENOUS

## 2021-08-05 MED ORDER — LIDOCAINE VISCOUS HCL 2 % MT SOLN
OROMUCOSAL | Status: AC
  Filled 2021-08-05: qty 15

## 2021-08-05 MED ORDER — SODIUM CHLORIDE 0.9 % IV SOLN: INTRAVENOUS | Status: DC

## 2021-08-05 MED ORDER — FENTANYL CITRATE (PF) 100 MCG/2ML IJ SOLN
INTRAMUSCULAR | Status: AC
  Filled 2021-08-05: qty 2

## 2021-08-05 NOTE — Procedures (Signed)
Interventional Radiology Procedure Note  Procedure: Suprapubic catheter replacement  Complications: None  Estimated Blood Loss: < 10 mL  Findings: SPT tract catheterized and dilated over wire. Unable to advance 16 Fr Council Foley over wire. 14 Fr pigtail catheter advanced and formed in bladder. Connected to gravity bag.  Jodi Marble. Fredia Sorrow, M.D Pager:  [phone]

## 2021-08-05 NOTE — Sedation Documentation (Signed)
Foley catheter removed by Kennon Portela RN

## 2021-08-05 NOTE — Sedation Documentation (Addendum)
Note deleted

## 2021-08-05 NOTE — H&P (Signed)
Chief Complaint: Patient was seen in consultation today for suprapubic catheter placement/replacement at the request of Stoneking,Bradley J.  Referring Physician(s): Stoneking,Bradley J.  Supervising Physician: Dr. Fredia SorrowYamagata  [name]  History of Present Illness: [name] is a 66 y.o. male with medical history significant for schizophrenia, urinary retention, hypotonic bladder and bladder outlet obstruction.   Had SP tube placed 5/2. Unfortunately he recently pulled it out. He is here for replacement today. Urethral foley was placed, not in the SP tract. Likely that the tract has sealed. Needs replacement or new SP tube. PMHx, meds, labs, imaging, allergies reviewed. Feels well, no recent fevers, chills, illness. Has been NPO today as directed.   Past Medical History:  Diagnosis Date   Schizophrenia [facility](HCC)     Past Surgical History:  Procedure Laterality Date   SHOULDER SURGERY      Allergies: Patient has no known allergies.  Medications: Prior to Admission medications   Medication Sig Start Date End Date Taking? Authorizing Provider  benztropine (COGENTIN) 2 MG tablet Take 1 tablet (2 mg total) by mouth at bedtime. Patient taking differently: Take 2 mg by mouth 2 (two) times daily. 09/28/12  Yes Thermon Leylandavis, Laura A, NP  clindamycin (CLEOCIN) 300 MG capsule Take 300 mg by mouth 4 (four) times daily. 04/16/21  Yes [provider]  clonazePAM (KLONOPIN) 1 MG tablet Take 1 tablet (1 mg total) by mouth at bedtime. 09/28/12  Yes Thermon Leylandavis, Laura A, NP  donepezil (ARICEPT) 5 MG tablet Take 5 mg by mouth daily. 04/17/21  Yes [provider]  levothyroxine (SYNTHROID, LEVOTHROID) 75 MCG tablet Take 1 tablet (75 mcg total) by mouth daily. 09/28/12  Yes Thermon Leylandavis, Laura A, NP  meloxicam (MOBIC) 15 MG tablet Take 15 mg by mouth daily. 04/10/21  Yes [provider]  metFORMIN (GLUCOPHAGE-XR) 500 MG 24 hr tablet Take 1 tablet by mouth 2 (two) times  daily. 03/13/21  Yes [provider]  omeprazole (PRILOSEC) 20 MG capsule Take 1 capsule (20 mg total) by mouth daily. 09/28/12  Yes Thermon Leylandavis, Laura A, NP  paliperidone (INVEGA) 3 MG 24 hr tablet Take 1 tablet by mouth 2 (two) times daily. 03/13/21  Yes [provider]  polyethylene glycol (MIRALAX / GLYCOLAX) 17 g packet Take 17 g by mouth 2 (two) times daily. 03/20/21  Yes Haydee Monicaavid, Rachal A, MD  QUEtiapine (SEROQUEL) 200 MG tablet Take 600 mg by mouth at bedtime. 03/13/21  Yes [provider]  rosuvastatin (CRESTOR) 5 MG tablet Take 5 mg by mouth once a week. 03/13/21  Yes [provider]  silodosin (RAPAFLO) 8 MG CAPS capsule Take 1 capsule (8 mg total) by mouth daily. 04/13/21  Yes Stoneking, Danford BadBradley J., MD  traZODone (DESYREL) 100 MG tablet Take 2 tablets (200 mg total) by mouth at bedtime. For sleep. 09/28/12  Yes Thermon Leylandavis, Laura A, NP  paliperidone (INVEGA SUSTENNA) 234 MG/1.5ML SUSY injection Inject 1 mL into the muscle every 30 (thirty) days. 01/23/21   [provider]  senna-docusate (SENOKOT-S) 8.6-50 MG tablet Take 1 tablet by mouth at bedtime as needed for mild constipation. 03/20/21   Haydee Monicaavid, Rachal A, MD  terbinafine (LAMISIL) 1 % cream Apply topically 2 (two) times daily. 04/16/21   [provider]     No family history on file.  Social History   Socioeconomic History   Marital status: Single    Spouse name: Not on file   Number of children: Not on file  Years of education: Not on file   Highest education level: Not on file  Occupational History   Not on file  Tobacco Use   Smoking status: Every Day    Packs/day: 1.00    Types: Cigarettes   Smokeless tobacco: Not on file  Substance and Sexual Activity   Alcohol use: Yes    Comment: occasionaly   Drug use: No   Sexual activity: Not on file  Other Topics Concern   Not on file  Social History Narrative   Not on file   Social Determinants of Health   Financial Resource Strain: Not  on file  Food Insecurity: Not on file  Transportation Needs: Not on file  Physical Activity: Not on file  Stress: Not on file  Social Connections: Not on file     Review of Systems: A 12 point ROS discussed and pertinent positives are indicated in the HPI above.  All other systems are negative.  Review of Systems  All other systems reviewed and are negative.   Vital Signs: BP 107/63   Pulse 87   Temp 98.1 F (36.7 C) (Oral)   Resp 18   Ht 5\' 10"  (1.778 m)   Wt 88.9 kg   SpO2 99%   BMI 28.12 kg/m   Physical Exam Constitutional:      General: He is not in acute distress.    Appearance: Normal appearance. He is not ill-appearing.  HENT:     Head: Normocephalic and atraumatic.     Mouth/Throat:     Mouth: Mucous membranes are dry.     Pharynx: Oropharynx is clear.  Eyes:     Extraocular Movements: Extraocular movements intact.     Pupils: Pupils are equal, round, and reactive to light.  Cardiovascular:     Rate and Rhythm: Normal rate and regular rhythm.     Heart sounds: Normal heart sounds.  Pulmonary:     Effort: Pulmonary effort is normal. No respiratory distress.     Breath sounds: Normal breath sounds.  Abdominal:     General: Bowel sounds are normal. There is no distension.     Palpations: Abdomen is soft.     Tenderness: There is no abdominal tenderness. There is no guarding.  Musculoskeletal:     Right lower leg: No edema.     Left lower leg: No edema.  Skin:    General: Skin is warm and dry.  Neurological:     Mental Status: He is alert and oriented to person, place, and time.  Psychiatric:        Mood and Affect: Mood normal.     Imaging: No results found.  Labs:  CBC: Recent Labs    03/18/21 0610 03/19/21 0605 03/20/21 0523 06/23/21 0816  WBC 7.5 5.9 6.3 6.1  HGB 10.7* 10.8* 11.7* 11.0*  HCT 31.8* 31.7* 35.2* 33.4*  PLT 205 214 251 206     COAGS: Recent Labs    06/23/21 0816  INR 1.2    BMP: Recent Labs    03/17/21 2213  03/18/21 0610 03/19/21 0605 03/20/21 0523  NA 128* 133* 139 138  K 2.9* 3.6 5.1 5.2*  CL 97* 102 107 99  CO2 20* 19* 24 26  GLUCOSE 143* 124* 135* 142*  BUN 35* 34* 16 12  CALCIUM 8.6* 8.4* 8.8* 9.5  CREATININE 2.69* 2.04* 0.98 0.83  GFRNONAA 26* 36* >60 >60     LIVER FUNCTION TESTS: Recent Labs    03/17/21 2213 03/18/21 0610  BILITOT 1.0 1.1  AST 19 18  ALT 18 18  ALKPHOS 78 66  PROT 8.0 6.9  ALBUMIN 4.0 3.4*     TUMOR MARKERS: No results for input(s): "AFPTM", "CEA", "CA199", "CHROMGRNA" in the last 8760 hours.  Assessment and Plan: History of medical history significant for schizophrenia, urinary retention, hypotonic bladder and bladder outlet obstruction.   Needs SP tube replaced   Risks and benefits of suprapubic catheter placement discussed with the patient including bleeding, infection, damage to adjacent structures and sepsis.  All of the patient's questions were answered, patient is agreeable to proceed. Consent signed and in chart.   Thank you for this interesting consult.  I greatly enjoyed meeting [name] and look forward to participating in their care.  A copy of this report was sent to the requesting provider on this date.  Electronically Signed: Brayton El, PA-C 08/05/2021, 12:18 PM   I spent a total of 20 minutes in face to face in clinical consultation, greater than 50% of which was counseling/coordinating care for suprapubic catheter placement.

## 2021-08-20 DIAGNOSIS — Z9359 Other cystostomy status: Secondary | ICD-10-CM | POA: Diagnosis not present

## 2021-08-20 DIAGNOSIS — Z87891 Personal history of nicotine dependence: Secondary | ICD-10-CM | POA: Diagnosis not present

## 2021-08-20 DIAGNOSIS — E7800 Pure hypercholesterolemia, unspecified: Secondary | ICD-10-CM | POA: Diagnosis not present

## 2021-08-20 DIAGNOSIS — Z299 Encounter for prophylactic measures, unspecified: Secondary | ICD-10-CM | POA: Diagnosis not present

## 2021-08-23 ENCOUNTER — Encounter (HOSPITAL_COMMUNITY): Payer: Self-pay | Admitting: Emergency Medicine

## 2021-08-23 ENCOUNTER — Emergency Department (HOSPITAL_COMMUNITY)
Admission: EM | Admit: 2021-08-23 | Discharge: 2021-08-23 | Disposition: A | Discharge status: Home / Self Care | Payer: Medicare Other | Attending: Emergency Medicine | Admitting: Emergency Medicine

## 2021-08-23 ENCOUNTER — Other Ambulatory Visit: Payer: Self-pay

## 2021-08-23 DIAGNOSIS — N390 Urinary tract infection, site not specified: Secondary | ICD-10-CM | POA: Diagnosis not present

## 2021-08-23 DIAGNOSIS — F0390 Unspecified dementia without behavioral disturbance: Secondary | ICD-10-CM | POA: Insufficient documentation

## 2021-08-23 DIAGNOSIS — E119 Type 2 diabetes mellitus without complications: Secondary | ICD-10-CM | POA: Insufficient documentation

## 2021-08-23 DIAGNOSIS — T83518A Infection and inflammatory reaction due to other urinary catheter, initial encounter: Secondary | ICD-10-CM | POA: Insufficient documentation

## 2021-08-23 DIAGNOSIS — E039 Hypothyroidism, unspecified: Secondary | ICD-10-CM | POA: Diagnosis not present

## 2021-08-23 DIAGNOSIS — R69 Illness, unspecified: Secondary | ICD-10-CM | POA: Diagnosis not present

## 2021-08-23 DIAGNOSIS — R5381 Other malaise: Secondary | ICD-10-CM | POA: Diagnosis not present

## 2021-08-23 DIAGNOSIS — T82511A Breakdown (mechanical) of surgically created arteriovenous shunt, initial encounter: Secondary | ICD-10-CM | POA: Insufficient documentation

## 2021-08-23 DIAGNOSIS — T83511A Infection and inflammatory reaction due to indwelling urethral catheter, initial encounter: Secondary | ICD-10-CM | POA: Diagnosis not present

## 2021-08-23 DIAGNOSIS — T83010A Breakdown (mechanical) of cystostomy catheter, initial encounter: Secondary | ICD-10-CM

## 2021-08-23 DIAGNOSIS — Z743 Need for continuous supervision: Secondary | ICD-10-CM | POA: Diagnosis not present

## 2021-08-23 LAB — URINALYSIS, ROUTINE W REFLEX MICROSCOPIC
Bilirubin Urine: NEGATIVE
Glucose, UA: NEGATIVE mg/dL
Ketones, ur: 5 mg/dL — AB
Leukocytes,Ua: NEGATIVE
Nitrite: NEGATIVE
Protein, ur: >=300 mg/dL — AB
RBC / HPF: >50 RBC/hpf — ABNORMAL HIGH (ref 0–5)
Specific Gravity, Urine: 1.027 (ref 1.005–1.030)
pH: 5 (ref 5.0–8.0)

## 2021-08-23 NOTE — Discharge Instructions (Addendum)
Continue current antibiotics.  Urine has been sent for culture.  We will call you if you need to change the antibiotic.

## 2021-08-23 NOTE — ED Notes (Signed)
[name] received an update.

## 2021-08-23 NOTE — ED Triage Notes (Signed)
Pt to the ED from home BIB [facility] with a complaint of a displaced suprapubic catheter.  Pt has the old cath with him at this time.

## 2021-08-23 NOTE — ED Provider Notes (Signed)
[facility] EMERGENCY DEPARTMENT Provider Note   CSN: [id number] Arrival date & time: 08/23/21  1435     History  No chief complaint on file.   [name] is a 66 y.o. male.  Pt is a 66 yo male with pmhx significant for dementia, schizophrenia, hypothyroidism, DM and urinary retention.  Pt accidentally pulled out his suprapubic catheter today.  He has a 8 F cath.         Home Medications Prior to Admission medications   Medication Sig Start Date End Date Taking? Authorizing Provider  benztropine (COGENTIN) 2 MG tablet Take 1 tablet (2 mg total) by mouth at bedtime. Patient taking differently: Take 2 mg by mouth 2 (two) times daily. 09/28/12   Thermon Leyland, NP  donepezil (ARICEPT) 5 MG tablet Take 5 mg by mouth at bedtime. 04/17/21   [provider]  levothyroxine (SYNTHROID, LEVOTHROID) 75 MCG tablet Take 1 tablet (75 mcg total) by mouth daily. 09/28/12   Thermon Leyland, NP  meloxicam (MOBIC) 15 MG tablet Take 15 mg by mouth daily. 04/10/21   [provider]  metFORMIN (GLUCOPHAGE-XR) 500 MG 24 hr tablet Take 1 tablet by mouth 2 (two) times daily. 03/13/21   [provider]  omeprazole (PRILOSEC) 20 MG capsule Take 1 capsule (20 mg total) by mouth daily. 09/28/12   Thermon Leyland, NP  paliperidone (INVEGA SUSTENNA) 234 MG/1.5ML SUSY injection Inject 1 mL into the muscle every 30 (thirty) days. 01/23/21   [provider]  paliperidone (INVEGA) 3 MG 24 hr tablet Take 1 tablet by mouth 2 (two) times daily. 03/13/21   [provider]  polyethylene glycol (MIRALAX / GLYCOLAX) 17 g packet Take 17 g by mouth 2 (two) times daily. Patient taking differently: Take 17 g by mouth 2 (two) times daily as needed for mild constipation. 03/20/21   Haydee Monica, MD  QUEtiapine (SEROQUEL) 200 MG tablet Take 600 mg by mouth at bedtime. 03/13/21   [provider]  rosuvastatin (CRESTOR) 5 MG tablet Take 5 mg by mouth once a week. Take 5 mg by mouth  every week on Wednesday 03/13/21   [provider]  senna-docusate (SENOKOT-S) 8.6-50 MG tablet Take 1 tablet by mouth at bedtime as needed for mild constipation. 03/20/21   Haydee Monica, MD  silodosin (RAPAFLO) 8 MG CAPS capsule Take 8 mg by mouth daily. 07/30/21   [provider]  terbinafine (LAMISIL) 1 % cream Apply 1 application  topically daily as needed (rash). 04/16/21   [provider]      Allergies    Patient has no known allergies.    Review of Systems   Review of Systems  Gastrointestinal:  Positive for abdominal pain.  All other systems reviewed and are negative.   Physical Exam Updated Vital Signs BP 121/75   Pulse 95   Temp 97.6 F (36.4 C)   Resp 17   Ht 5\' 10"  (1.778 m)   Wt 88.9 kg   SpO2 99%   BMI 28.12 kg/m  Physical Exam Vitals and nursing note reviewed.  Constitutional:      Appearance: Normal appearance.  HENT:     Head: Normocephalic and atraumatic.     Right Ear: External ear normal.     Left Ear: External ear normal.     Nose: Nose normal.     Mouth/Throat:     Mouth: Mucous membranes are moist.     Pharynx: Oropharynx is clear.  Eyes:     Extraocular Movements: Extraocular movements intact.     Conjunctiva/sclera: Conjunctivae normal.     Pupils: Pupils are equal, round, and reactive to light.  Cardiovascular:     Rate and Rhythm: Normal rate and regular rhythm.     Pulses: Normal pulses.     Heart sounds: Normal heart sounds.  Pulmonary:     Effort: Pulmonary effort is normal.     Breath sounds: Normal breath sounds.  Abdominal:     General: Abdomen is flat. Bowel sounds are normal.     Palpations: Abdomen is soft.     Comments: Suprapubic cath site noted.  No active bleeding.  Musculoskeletal:        General: Normal range of motion.     Cervical back: Normal range of motion and neck supple.  Skin:    General: Skin is warm.     Capillary Refill: Capillary refill takes less than 2 seconds.  Neurological:      General: No focal deficit present.     Mental Status: He is alert. Mental status is at baseline.  Psychiatric:        Mood and Affect: Mood normal.     ED Results / Procedures / Treatments   Labs (all labs ordered are listed, but only abnormal results are displayed) Labs Reviewed  URINALYSIS, ROUTINE W REFLEX MICROSCOPIC - Abnormal; Notable for the following components:      Result Value   Color, Urine AMBER (*)    APPearance CLOUDY (*)    Hgb urine dipstick LARGE (*)    Ketones, ur 5 (*)    Protein, ur >=300 (*)    RBC / HPF >50 (*)    Bacteria, UA RARE (*)    All other components within normal limits  URINE CULTURE    EKG None  Radiology No results found.  Procedures BLADDER CATHETERIZATION  Date/Time: 08/23/2021 3:15 PM  Performed by: Jacalyn Lefevre, MD Authorized by: Jacalyn Lefevre, MD   Consent:    Consent obtained:  Verbal   Consent given by:  Patient   Alternatives discussed:  No treatment Universal protocol:    Patient identity confirmed:  Verbally with patient Pre-procedure details:    Preparation: Patient was prepped and draped in usual sterile fashion   Anesthesia:    Anesthesia method:  None Procedure details:    Provider performed due to:  Complicated insertion   Catheter insertion:  Indwelling   Catheter type:  Foley   Catheter size:  14 Fr   Bladder irrigation: no     Number of attempts:  1   Urine characteristics:  Cloudy Post-procedure details:    Procedure completion:  Tolerated well, no immediate complications Comments:     14 F foley placed in the suprapubic track.     Medications Ordered in ED Medications - No data to display  ED Course/ Medical Decision Making/ A&P                           Medical Decision Making Amount and/or Complexity of Data Reviewed Labs: ordered.   This patient presents to the ED for concern of suprapubic catheter displacement, this involves an extensive number of treatment options, and is a  complaint that carries with it a high risk of complications and morbidity.  The differential diagnosis includes uti, bleeding, replacement   Co morbidities that complicate the patient evaluation  dementia, schizophrenia, hypothyroidism, DM and urinary retention  Additional history obtained:  Additional history obtained from epic chart review External records from outside source obtained and reviewed including family   Lab Tests:  I Ordered, and personally interpreted labs.  The pertinent results include:  UA with large hgb, >50 rbc and 21-50 wbc   Medicines ordered and prescription drug management:   I have reviewed the patients home medicines and have made adjustments as needed   Critical Interventions:  Suprapubic cath replacement     Problem List / ED Course:  Suprapubic cath replacement:  procedure went well.  Pt tolerated well. UTI:  pt's nephew said he just started amox yesterday.  I told him to continue those abx.  Urine sent for culture.   Reevaluation:  After the interventions noted above, I reevaluated the patient and found that they have :improved   Social Determinants of Health:  Lives at home   Dispostion:  After consideration of the diagnostic results and the patients response to treatment, I feel that the patent would benefit from discharge with outpatient f/u.          Final Clinical Impression(s) / ED Diagnoses Final diagnoses:  Urinary tract infection associated with indwelling urethral catheter, initial encounter [facility])  Suprapubic catheter dysfunction, initial encounter [facility])    Rx / DC Orders ED Discharge Orders     None         Jacalyn Lefevre, MD 08/23/21 1647

## 2021-08-25 LAB — URINE CULTURE: Culture: NO GROWTH

## 2021-08-31 DIAGNOSIS — Z299 Encounter for prophylactic measures, unspecified: Secondary | ICD-10-CM | POA: Diagnosis not present

## 2021-08-31 DIAGNOSIS — E1165 Type 2 diabetes mellitus with hyperglycemia: Secondary | ICD-10-CM | POA: Diagnosis not present

## 2021-08-31 DIAGNOSIS — Z9359 Other cystostomy status: Secondary | ICD-10-CM | POA: Diagnosis not present

## 2021-08-31 DIAGNOSIS — I10 Essential (primary) hypertension: Secondary | ICD-10-CM | POA: Diagnosis not present

## 2021-09-10 ENCOUNTER — Ambulatory Visit (INDEPENDENT_AMBULATORY_CARE_PROVIDER_SITE_OTHER): Payer: Medicare Other | Admitting: Urology

## 2021-09-10 ENCOUNTER — Encounter: Payer: Self-pay | Admitting: Urology

## 2021-09-10 VITALS — BP 112/75 | HR 89 | Ht 70.0 in | Wt 196.0 lb

## 2021-09-10 DIAGNOSIS — R339 Retention of urine, unspecified: Secondary | ICD-10-CM

## 2021-09-10 DIAGNOSIS — N312 Flaccid neuropathic bladder, not elsewhere classified: Secondary | ICD-10-CM | POA: Diagnosis not present

## 2021-09-10 NOTE — Progress Notes (Signed)
Assessment: 1. Hypotonic bladder   2. Urinary retention      Plan: I reviewed the patient's chart regarding his multiple encounters for removal of his suprapubic catheter. He currently has a 14 French catheter in place which is draining well. I discussed the importance of preventing traumatic removal of the SP tube with Mr. Beauregard family member.  I recommended that they consider using a leg bag but he does not wish to change at this time. We will make arrangements for a follow-up visit in approximately 2 weeks for SP tube change.  Chief Complaint:  Chief Complaint  Patient presents with   Urinary Retention    History of Present Illness:  [name] is a 66 y.o. year old male who is seen for further evaluation of urinary retention.  He presented to [facility] on 03/17/2021 with altered mental status.  He does have a history of schizophrenia.  He was found to have a distended abdomen on presentation.  A Foley catheter was placed with return of 2300 mL of urine.  CT imaging from 03/18/2021 demonstrated no stones or hydronephrosis, no renal masses, and a large stool burden throughout the colon.  He was started on Flomax.  He was treated for constipation.  His creatinine was 2.69 on admission and improved to 0.83 prior to discharge on 03/20/2021. At his initial visit on 03/27/2021, his Foley catheter was draining well.  He continued to have problems with constipation.  He was taking MiraLAX and Senokot S.  No prior history of retention. His nephew reports that he has been pulling at the Foley catheter during periods of confusion. A voiding trial was attempted on 03/27/2021 but was unsuccessful and the catheter was replaced. The patient returned for another voiding trial on 04/06/2021.  He was unable to void in the office and returned later that day unable to void requiring replacement of a Foley catheter.  At his visit on 04/13/21, he continued with a foley catheter.  He continued to have  issues with constipation although he had multiple bowel movements approximately 4 days prior.  No recent bowel movement.  He continues on MiraLAX and Senokot.  He also continued to take Flomax daily. He was continued with the Foley catheter.  He was changed to Rapaflo 8 mg daily in place of tamsulosin. Urine culture grew >100 K MRSA.  He was treated with Bactrim.  He was evaluated with cystoscopy on 04/29/2021.  Cystoscopy showed no significant enlargement of the prostate, a large capacity bladder with few trabeculations, and mucosal edema consistent with chronic Foley catheter irritation.  He was unable to void following the procedure. He underwent evaluation with urodynamics on 05/25/2021.  The patient had a bladder capacity of 987 mL with decreased sensation.  There was instability noted with leakage.  The patient was able to generate a slight voluntary contraction and void a small amount with straining.  Max detrusor pressure was 20 cm of water with a max flow of 5 mL/s.  He underwent placement of a suprapubic catheter by interventional radiology on 06/23/2021. The patient pulled out his suprapubic catheter on 08/03/2021.  He underwent replacement of a suprapubic catheter by interventional radiology on 08/05/2021.  He again pulled out his catheter on 08/23/2021.  The catheter was replaced in the emergency department. Urine culture showed no growth.  He presents today for follow-up.  His SP tube is currently draining well.  He has a 14 [place] catheter in place.  No recent gross hematuria.  No fevers or chills.  Portions of the above documentation were copied from a prior visit for review purposes only.  Past Medical History:  Past Medical History:  Diagnosis Date   Schizophrenia [facility])     Past Surgical History:  Past Surgical History:  Procedure Laterality Date   IR CATHETER TUBE CHANGE  08/05/2021   SHOULDER SURGERY      Allergies:  No Known Allergies  Family History:  No family history on  file.  Social History:  Social History   Tobacco Use   Smoking status: Every Day    Packs/day: 1.00    Types: Cigarettes  Vaping Use   Vaping Use: Never used  Substance Use Topics   Alcohol use: Yes    Comment: occasionaly   Drug use: No    ROS: Constitutional:  Negative for fever, chills, weight loss CV: Negative for chest pain, previous MI, hypertension Respiratory:  Negative for shortness of breath, wheezing, sleep apnea, frequent cough GI:  Negative for nausea, vomiting, bloody stool, GERD   Physical exam: BP 112/75   Pulse 89   Ht 5\' 10"  (1.778 m)   Wt 196 lb (88.9 kg)   BMI 28.12 kg/m  GENERAL APPEARANCE:  Well appearing, well developed, well nourished, NAD HEENT:  Atraumatic, normocephalic, oropharynx clear NECK:  Supple without lymphadenopathy or thyromegaly ABDOMEN:  Soft, non-tender, no masses; SP tube site intact; SP tube draining clear urine EXTREMITIES:  Without clubbing, cyanosis, or edema NEUROLOGIC: Alert, in wheelchair CN II-XII grossly intact MENTAL STATUS:  appropriate BACK:  Non-tender to palpation, No CVAT SKIN:  Warm, dry, and intact  Results: None

## 2021-09-24 ENCOUNTER — Encounter: Payer: Self-pay | Admitting: Urology

## 2021-09-24 ENCOUNTER — Ambulatory Visit (INDEPENDENT_AMBULATORY_CARE_PROVIDER_SITE_OTHER): Payer: Medicare Other | Admitting: Urology

## 2021-09-24 ENCOUNTER — Ambulatory Visit: Payer: Medicare Other | Admitting: Physician Assistant

## 2021-09-24 VITALS — BP 137/77 | HR 86 | Ht 70.0 in | Wt 196.0 lb

## 2021-09-24 DIAGNOSIS — N312 Flaccid neuropathic bladder, not elsewhere classified: Secondary | ICD-10-CM

## 2021-09-24 DIAGNOSIS — R339 Retention of urine, unspecified: Secondary | ICD-10-CM | POA: Diagnosis not present

## 2021-09-24 MED ORDER — NYSTATIN 100000 UNIT/GM EX CREA: 1.0000 | TOPICAL_CREAM | Freq: Two times a day (BID) | CUTANEOUS | 1 refills | Status: DC

## 2021-09-24 NOTE — Addendum Note (Signed)
Addended by: Grier Rocher on: 09/24/2021 12:26 PM   Modules accepted: Orders

## 2021-09-24 NOTE — Progress Notes (Signed)
Assessment: 1. Hypotonic bladder   2. Urinary retention     Plan: SP tube changed today - 10F foley placed. Return to office in 1 month for nursing visit for SP tube change Rx for nystatin cream to area around SP tube site  Chief Complaint:  Chief Complaint  Patient presents with   Urinary Retention    History of Present Illness:  [name] is a 66 y.o. year old male who is seen for further evaluation of urinary retention.  He presented to [facility] on 03/17/2021 with altered mental status.  He does have a history of schizophrenia.  He was found to have a distended abdomen on presentation.  A Foley catheter was placed with return of 2300 mL of urine.  CT imaging from 03/18/2021 demonstrated no stones or hydronephrosis, no renal masses, and a large stool burden throughout the colon.  He was started on Flomax.  He was treated for constipation.  His creatinine was 2.69 on admission and improved to 0.83 prior to discharge on 03/20/2021. At his initial visit on 03/27/2021, his Foley catheter was draining well.  He continued to have problems with constipation.  He was taking MiraLAX and Senokot S.  No prior history of retention. His nephew reports that he has been pulling at the Foley catheter during periods of confusion. A voiding trial was attempted on 03/27/2021 but was unsuccessful and the catheter was replaced. The patient returned for another voiding trial on 04/06/2021.  He was unable to void in the office and returned later that day unable to void requiring replacement of a Foley catheter.  At his visit on 04/13/21, he continued with a foley catheter.  He continued to have issues with constipation although he had multiple bowel movements approximately 4 days prior.  No recent bowel movement.  He continues on MiraLAX and Senokot.  He also continued to take Flomax daily. He was continued with the Foley catheter.  He was changed to Rapaflo 8 mg daily in place of tamsulosin. Urine culture  grew >100 K MRSA.  He was treated with Bactrim.  He was evaluated with cystoscopy on 04/29/2021.  Cystoscopy showed no significant enlargement of the prostate, a large capacity bladder with few trabeculations, and mucosal edema consistent with chronic Foley catheter irritation.  He was unable to void following the procedure. He underwent evaluation with urodynamics on 05/25/2021.  The patient had a bladder capacity of 987 mL with decreased sensation.  There was instability noted with leakage.  The patient was able to generate a slight voluntary contraction and void a small amount with straining.  Max detrusor pressure was 20 cm of water with a max flow of 5 mL/s.  He underwent placement of a suprapubic catheter by interventional radiology on 06/23/2021. The patient pulled out his suprapubic catheter on 08/03/2021.  He underwent replacement of a suprapubic catheter by interventional radiology on 08/05/2021.  He again pulled out his catheter on 08/23/2021.  The catheter was replaced in the emergency department. Urine culture showed no growth.  He presents today for SP tube change.  His SP tube is draining well.  No recent hematuria, fevers, or chills.  He has developed a erythematous rash around the SP tube site.  Portions of the above documentation were copied from a prior visit for review purposes only.  Past Medical History:  Past Medical History:  Diagnosis Date   Schizophrenia [facility])     Past Surgical History:  Past Surgical History:  Procedure Laterality  Date   IR CATHETER TUBE CHANGE  08/05/2021   SHOULDER SURGERY      Allergies:  No Known Allergies  Family History:  No family history on file.  Social History:  Social History   Tobacco Use   Smoking status: Every Day    Packs/day: 1.00    Types: Cigarettes  Vaping Use   Vaping Use: Never used  Substance Use Topics   Alcohol use: Yes    Comment: occasionaly   Drug use: No    ROS: Constitutional:  Negative for fever, chills,  weight loss CV: Negative for chest pain, previous MI, hypertension Respiratory:  Negative for shortness of breath, wheezing, sleep apnea, frequent cough GI:  Negative for nausea, vomiting, bloody stool, GERD   Physical exam: BP 137/77   Pulse 86   Ht 5\' 10"  (1.778 m)   Wt 196 lb (88.9 kg)   BMI 28.12 kg/m  GENERAL APPEARANCE:  Well nourished, NAD HEENT:  Atraumatic, normocephalic, oropharynx clear NECK:  Supple without lymphadenopathy or thyromegaly ABDOMEN:  Soft, non-tender, no masses; SP tube site intact, erythematous rash around site EXTREMITIES:  Moves all extremities well, without clubbing, cyanosis, or edema NEUROLOGIC:  In wheelchair, CN II-XII grossly intact MENTAL STATUS:  appropriate BACK:  Non-tender to palpation, No CVAT SKIN:  Warm, dry, and intact  Results: None  Procedure: SP tube change The patient's SP tube was removed.  Under sterile conditions, a 16 French Foley catheter was placed through the SP tube site without difficulty.  10 mL of sterile water was placed in the balloon.  The catheter irrigated easily with quantitative return of irrigation.  The SP tube was secured with a StatLock and tape.  The patient tolerated the procedure well.

## 2021-09-28 DIAGNOSIS — I10 Essential (primary) hypertension: Secondary | ICD-10-CM | POA: Diagnosis not present

## 2021-09-28 DIAGNOSIS — E1165 Type 2 diabetes mellitus with hyperglycemia: Secondary | ICD-10-CM | POA: Diagnosis not present

## 2021-09-28 DIAGNOSIS — E119 Type 2 diabetes mellitus without complications: Secondary | ICD-10-CM | POA: Diagnosis not present

## 2021-09-28 DIAGNOSIS — Z299 Encounter for prophylactic measures, unspecified: Secondary | ICD-10-CM | POA: Diagnosis not present

## 2021-10-07 ENCOUNTER — Ambulatory Visit: Payer: Medicare Other

## 2021-10-07 ENCOUNTER — Ambulatory Visit (INDEPENDENT_AMBULATORY_CARE_PROVIDER_SITE_OTHER): Payer: Medicare Other | Admitting: Physician Assistant

## 2021-10-07 VITALS — Temp 97.7°F

## 2021-10-07 DIAGNOSIS — N312 Flaccid neuropathic bladder, not elsewhere classified: Secondary | ICD-10-CM

## 2021-10-07 DIAGNOSIS — Z435 Encounter for attention to cystostomy: Secondary | ICD-10-CM

## 2021-10-07 DIAGNOSIS — R339 Retention of urine, unspecified: Secondary | ICD-10-CM

## 2021-10-07 NOTE — Progress Notes (Signed)
Patient came in with concerns of infection.  Patients nephew states that his urine has had an odor and was cloudy.  Changed bag, urine has since cleared up.  Informed patient to increase water intake and to call back if he notices patient develops a fever and urine gets worse.  I also changed patients anchor and cleaned around the tube.  The tube had some redness and drainage so patient was added to PA's schedule for her to assess.    Guss Bunde, CMA

## 2021-10-07 NOTE — Progress Notes (Signed)
Assessment: 1. Encounter for suprapubic catheter care (HCC)  2. Hypotonic bladder  3. Urinary retention    Plan: Patient's son is advised to keep the area clean and dry and avoid having the patient's trousers restrict the area as noted on today's exam.  Neosporin ointment twice daily to the ostomy site and 4 x 4's given for family to change 3-4 times daily.  Recommend increase in fluid intake to decrease the concentration of the urine.  Keep scheduled follow-up with Dr. Pete Glatter.  Chief Complaint: No chief complaint on file.   HPI: [name] is a 66 y.o. male who presents for nurse visit for concern for dark urine noted from SP tube drainage.  No fever, chills, issues with drainage amount.  SP tube upsized on 09/24/2021.  Portions of the above documentation were copied from a prior visit for review purposes only.  Allergies: No Known Allergies  PMH: Past Medical History:  Diagnosis Date   Schizophrenia (HCC)     PSH: Past Surgical History:  Procedure Laterality Date   IR CATHETER TUBE CHANGE  08/05/2021   SHOULDER SURGERY      SH: Social History   Tobacco Use   Smoking status: Every Day    Packs/day: 1.00    Types: Cigarettes  Vaping Use   Vaping Use: Never used  Substance Use Topics   Alcohol use: Yes    Comment: occasionaly   Drug use: No    ROS: All other review of systems were reviewed and are negative except what is noted above in HPI  PE: Temp 97.7 F (36.5 C)  GENERAL APPEARANCE:  Well appearing, thin, no apparent distress HEENT:  Atraumatic, normocephalic NECK:  Supple. Trachea midline ABDOMEN:  Soft, non-tender, no masses.  SP ostomy site with some scant drainage and mild erythema.  Surrounding tissues healthy and dry.  There is no tenderness around the area.  The patient's pants are noted to have caused stricture marks around his abdomen, specifically over the ostomy site itself.   Results: Laboratory Data: Lab Results  Component  Value Date   WBC 6.1 06/23/2021   HGB 11.0 (L) 06/23/2021   HCT 33.4 (L) 06/23/2021   MCV 87.4 06/23/2021   PLT 206 06/23/2021    Lab Results  Component Value Date   CREATININE 0.83 03/20/2021    No results found for: "PSA"  No results found for: "TESTOSTERONE"  Lab Results  Component Value Date   HGBA1C (H) 06/29/2009    5.8 (NOTE)                                                                       According to the ADA Clinical Practice Recommendations for 2011, when HbA1c is used as a screening test:   >=6.5%   Diagnostic of Diabetes Mellitus           (if abnormal result  is confirmed)  5.7-6.4%   Increased risk of developing Diabetes Mellitus  References:Diagnosis and Classification of Diabetes Mellitus,Diabetes Care,2011,34(Suppl 1):S62-S69 and Standards of Medical Care in         Diabetes - 2011,Diabetes Care,2011,34  (Suppl 1):S11-S61.    Urinalysis    Component Value Date/Time   COLORURINE AMBER (A) 08/23/2021 1509  APPEARANCEUR CLOUDY (A) 08/23/2021 1509   APPEARANCEUR Cloudy (A) 04/13/2021 1059   LABSPEC 1.027 08/23/2021 1509   PHURINE 5.0 08/23/2021 1509   GLUCOSEU NEGATIVE 08/23/2021 1509   HGBUR LARGE (A) 08/23/2021 1509   BILIRUBINUR NEGATIVE 08/23/2021 1509   BILIRUBINUR Negative 04/13/2021 1059   KETONESUR 5 (A) 08/23/2021 1509   PROTEINUR >=300 (A) 08/23/2021 1509   UROBILINOGEN 1.0 09/21/2012 0530   NITRITE NEGATIVE 08/23/2021 1509   LEUKOCYTESUR NEGATIVE 08/23/2021 1509    Lab Results  Component Value Date   LABMICR See below: 04/13/2021   WBCUA 6-10 (A) 04/13/2021   LABEPIT 0-10 04/13/2021   MUCUS Present 04/13/2021   BACTERIA RARE (A) 08/23/2021    Pertinent Imaging: No results found for this or any previous visit.  No results found for this or any previous visit.  No results found for this or any previous visit.  No results found for this or any previous visit.  No results found for this or any previous visit.  No results found  for this or any previous visit.  No results found for this or any previous visit.  No results found for this or any previous visit.  No results found for this or any previous visit (from the past 24 hour(s)).

## 2021-10-15 DIAGNOSIS — I10 Essential (primary) hypertension: Secondary | ICD-10-CM | POA: Diagnosis not present

## 2021-10-15 DIAGNOSIS — Z299 Encounter for prophylactic measures, unspecified: Secondary | ICD-10-CM | POA: Diagnosis not present

## 2021-10-15 DIAGNOSIS — R269 Unspecified abnormalities of gait and mobility: Secondary | ICD-10-CM | POA: Diagnosis not present

## 2021-10-19 ENCOUNTER — Telehealth: Payer: Self-pay

## 2021-10-19 NOTE — Telephone Encounter (Signed)
Received a call from care giver Renae Fickle, concern about pus in the surgical area where the SP tube. Reiterated instruction about wound care. Made care giver aware to keep the area dry, clean, and apply neosporin to the area, also instruct the caregiver to change dressing three time a day. Caregiver voiced understanding.

## 2021-10-27 ENCOUNTER — Ambulatory Visit: Payer: Medicare Other | Admitting: Urology

## 2021-10-27 NOTE — Progress Notes (Deleted)
Assessment: 1. Encounter for suprapubic catheter care (HCC)   2. Hypotonic bladder   3. Urinary retention      Plan: SP tube changed today - 108F foley placed. Return to office in 1 month for nursing visit for SP tube change Rx for nystatin cream to area around SP tube site  Chief Complaint:  No chief complaint on file.   History of Present Illness:  [name] is a 66 y.o. year old male who is seen for further evaluation of urinary retention.  He presented to [facility] on 03/17/2021 with altered mental status.  He does have a history of schizophrenia.  He was found to have a distended abdomen on presentation.  A Foley catheter was placed with return of 2300 mL of urine.  CT imaging from 03/18/2021 demonstrated no stones or hydronephrosis, no renal masses, and a large stool burden throughout the colon.  He was started on Flomax.  He was treated for constipation.  His creatinine was 2.69 on admission and improved to 0.83 prior to discharge on 03/20/2021. At his initial visit on 03/27/2021, his Foley catheter was draining well.  He continued to have problems with constipation.  He was taking MiraLAX and Senokot S.  No prior history of retention. His nephew reports that he has been pulling at the Foley catheter during periods of confusion. A voiding trial was attempted on 03/27/2021 but was unsuccessful and the catheter was replaced. The patient returned for another voiding trial on 04/06/2021.  He was unable to void in the office and returned later that day unable to void requiring replacement of a Foley catheter.  At his visit on 04/13/21, he continued with a foley catheter.  He continued to have issues with constipation although he had multiple bowel movements approximately 4 days prior.  No recent bowel movement.  He continues on MiraLAX and Senokot.  He also continued to take Flomax daily. He was continued with the Foley catheter.  He was changed to Rapaflo 8 mg daily in place of  tamsulosin. Urine culture grew >100 K MRSA.  He was treated with Bactrim.  He was evaluated with cystoscopy on 04/29/2021.  Cystoscopy showed no significant enlargement of the prostate, a large capacity bladder with few trabeculations, and mucosal edema consistent with chronic Foley catheter irritation.  He was unable to void following the procedure. He underwent evaluation with urodynamics on 05/25/2021.  The patient had a bladder capacity of 987 mL with decreased sensation.  There was instability noted with leakage.  The patient was able to generate a slight voluntary contraction and void a small amount with straining.  Max detrusor pressure was 20 cm of water with a max flow of 5 mL/s.  He underwent placement of a suprapubic catheter by interventional radiology on 06/23/2021. The patient pulled out his suprapubic catheter on 08/03/2021.  He underwent replacement of a suprapubic catheter by interventional radiology on 08/05/2021.  He again pulled out his catheter on 08/23/2021.  The catheter was replaced in the emergency department. Urine culture showed no growth. His SP tube was changed on 09/24/2021 with placement of a 16 French catheter.  He presents today for SP tube change.  His SP tube is draining well.  No recent hematuria, fevers, or chills.  He has developed a erythematous rash around the SP tube site.  Portions of the above documentation were copied from a prior visit for review purposes only.  Past Medical History:  Past Medical History:  Diagnosis Date  Schizophrenia [facility])     Past Surgical History:  Past Surgical History:  Procedure Laterality Date   IR CATHETER TUBE CHANGE  08/05/2021   SHOULDER SURGERY      Allergies:  No Known Allergies  Family History:  No family history on file.  Social History:  Social History   Tobacco Use   Smoking status: Every Day    Packs/day: 1.00    Types: Cigarettes  Vaping Use   Vaping Use: Never used  Substance Use Topics   Alcohol use:  Yes    Comment: occasionaly   Drug use: No    ROS: Constitutional:  Negative for fever, chills, weight loss CV: Negative for chest pain, previous MI, hypertension Respiratory:  Negative for shortness of breath, wheezing, sleep apnea, frequent cough GI:  Negative for nausea, vomiting, bloody stool, GERD   Physical exam: There were no vitals taken for this visit. GENERAL APPEARANCE:  Well appearing, well developed, well nourished, NAD HEENT:  Atraumatic, normocephalic, oropharynx clear NECK:  Supple without lymphadenopathy or thyromegaly ABDOMEN:  Soft, non-tender, no masses EXTREMITIES:  Moves all extremities well, without clubbing, cyanosis, or edema NEUROLOGIC:  Alert and oriented x 3, CN II-XII grossly intact MENTAL STATUS:  appropriate BACK:  Non-tender to palpation, No CVAT SKIN:  Warm, dry, and intact  Results: None

## 2021-10-29 ENCOUNTER — Ambulatory Visit: Payer: Medicare Other | Admitting: Orthopaedic Surgery

## 2021-11-05 ENCOUNTER — Encounter: Payer: Self-pay | Admitting: Orthopaedic Surgery

## 2021-11-05 ENCOUNTER — Ambulatory Visit (INDEPENDENT_AMBULATORY_CARE_PROVIDER_SITE_OTHER): Payer: Medicare Other | Admitting: Orthopaedic Surgery

## 2021-11-05 VITALS — Ht 70.0 in | Wt 195.0 lb

## 2021-11-05 DIAGNOSIS — G9341 Metabolic encephalopathy: Secondary | ICD-10-CM

## 2021-11-05 DIAGNOSIS — F0390 Unspecified dementia without behavioral disturbance: Secondary | ICD-10-CM

## 2021-11-05 NOTE — Progress Notes (Signed)
Office Visit Note   Patient: [name]           Date of Birth: 01/28/1956           MRN: [id number] Visit Date: 11/05/2021              Requested by: Ignatius Specking, MD [address] [place],  [place] [address] PCP: Ignatius Specking, MD   Assessment & Plan: Visit Diagnoses:  1. Acute metabolic encephalopathy   2. Dementia, unspecified dementia severity, unspecified dementia type, unspecified whether behavioral, psychotic, or mood disturbance or anxiety (HCC)     Plan: Patient with history of metabolic encephalopathy urinary retention has suprapubic catheter history of paranoid schizophrenia with progressive decreased communication skills and decreased ambulation.  Patient expresses he would rather stay with his nephew and be in a skilled nursing facility.  Encouraged him to walk as much as he can, and sit and rest when he gets tired.  Follow-Up Instructions: No follow-ups on file.   Orders:  No orders of the defined types were placed in this encounter.  No orders of the defined types were placed in this encounter.     Procedures: No procedures performed   Clinical Data: No additional findings.   Subjective: Chief Complaint  Patient presents with   gait abnormality    HPI 66 year old male here in a wheelchair who lives with his nephew and also nephew's daughter.  He has had gradual decreased ambulation in the last year and a half.  He does get up and walk with a walker out to the porch to sit on the porch and then back to bed.  He has a suprapubic catheter has hypotonic bladder bladder history of paranoid schizophrenia and dementia.  Acute metabolic encephalopathy.  Patient is minimally conversant today.  When he does walk with a walker is very slow.  He did have physical therapy which helped slightly but without really significant improvement.  Review of Systems all the systems noncontributory to HPI.   Objective: Vital Signs: Ht 5\' 10"  (1.778 m)   Wt 195 lb (88.5 kg)    BMI 27.98 kg/m   Physical Exam Constitutional:      Appearance: He is well-developed.     Comments: Blank stare.  Only speaks a few words and short sentences.  HENT:     Head: Normocephalic and atraumatic.     Right Ear: External ear normal.     Left Ear: External ear normal.  Eyes:     Pupils: Pupils are equal, round, and reactive to light.  Neck:     Thyroid: No thyromegaly.     Trachea: No tracheal deviation.  Cardiovascular:     Rate and Rhythm: Normal rate.  Pulmonary:     Effort: Pulmonary effort is normal.     Breath sounds: No wheezing.  Abdominal:     General: Bowel sounds are normal.     Palpations: Abdomen is soft.  Musculoskeletal:     Cervical back: Neck supple.  Skin:    General: Skin is warm and dry.     Capillary Refill: Capillary refill takes less than 2 seconds.  Neurological:     Mental Status: He is alert and oriented to person, place, and time.  Psychiatric:        Behavior: Behavior normal.        Thought Content: Thought content normal.        Judgment: Judgment normal.     Ortho Exam patient  appears older than his stated age.  Flat stare he can move his hands shake hands or to his feet up and down.  Specialty Comments:  No specialty comments available.  Imaging: No results found.   PMFS History: Patient Active Problem List   Diagnosis Date Noted   Dementia (HCC) 11/05/2021   Hypotonic bladder 07/24/2021   Urinary retention 03/27/2021   Constipation 03/27/2021   Acute metabolic encephalopathy 03/18/2021   Hypokalemia 03/18/2021   AKI (acute kidney injury) (HCC) 03/18/2021   Hyponatremia 03/18/2021   Paranoid schizophrenia, chronic condition (HCC) 09/22/2012   Past Medical History:  Diagnosis Date   Schizophrenia (HCC)     No family history on file.  Past Surgical History:  Procedure Laterality Date   IR CATHETER TUBE CHANGE  08/05/2021   SHOULDER SURGERY     Social History   Occupational History   Not on file  Tobacco Use    Smoking status: Every Day    Packs/day: 1.00    Types: Cigarettes   Smokeless tobacco: Not on file  Vaping Use   Vaping Use: Never used  Substance and Sexual Activity   Alcohol use: Yes    Comment: occasionaly   Drug use: No   Sexual activity: Not on file

## 2021-11-06 DIAGNOSIS — Z299 Encounter for prophylactic measures, unspecified: Secondary | ICD-10-CM | POA: Diagnosis not present

## 2021-11-06 DIAGNOSIS — R296 Repeated falls: Secondary | ICD-10-CM | POA: Diagnosis not present

## 2021-11-06 DIAGNOSIS — M179 Osteoarthritis of knee, unspecified: Secondary | ICD-10-CM | POA: Diagnosis not present

## 2021-11-13 ENCOUNTER — Ambulatory Visit: Payer: Medicare Other | Admitting: Urology

## 2021-11-17 ENCOUNTER — Encounter: Payer: Self-pay | Admitting: Urology

## 2021-11-17 ENCOUNTER — Ambulatory Visit (INDEPENDENT_AMBULATORY_CARE_PROVIDER_SITE_OTHER): Payer: Medicare Other | Admitting: Urology

## 2021-11-17 VITALS — BP 122/77 | HR 94

## 2021-11-17 DIAGNOSIS — R339 Retention of urine, unspecified: Secondary | ICD-10-CM

## 2021-11-17 DIAGNOSIS — N312 Flaccid neuropathic bladder, not elsewhere classified: Secondary | ICD-10-CM | POA: Diagnosis not present

## 2021-11-17 DIAGNOSIS — Z435 Encounter for attention to cystostomy: Secondary | ICD-10-CM

## 2021-11-17 NOTE — Progress Notes (Signed)
Assessment: 1. Encounter for suprapubic catheter care ([place])   2. Hypotonic bladder   3. Urinary retention     Plan: SP tube changed today - 75F foley placed. Return to office in 1 month for nursing visit for SP tube change  Chief Complaint:  Chief Complaint  Patient presents with   hypotonic bladder    History of Present Illness:  [name] is a 66 y.o. year old male who is seen for further evaluation of urinary retention.  He presented to [facility] on 03/17/2021 with altered mental status.  He does have a history of schizophrenia.  He was found to have a distended abdomen on presentation.  A Foley catheter was placed with return of 2300 mL of urine.  CT imaging from 03/18/2021 demonstrated no stones or hydronephrosis, no renal masses, and a large stool burden throughout the colon.  He was started on Flomax.  He was treated for constipation.  His creatinine was 2.69 on admission and improved to 0.83 prior to discharge on 03/20/2021. At his initial visit on 03/27/2021, his Foley catheter was draining well.  He continued to have problems with constipation.  He was taking MiraLAX and Senokot S.  No prior history of retention. His nephew reports that he has been pulling at the Foley catheter during periods of confusion. A voiding trial was attempted on 03/27/2021 but was unsuccessful and the catheter was replaced. The patient returned for another voiding trial on 04/06/2021.  He was unable to void in the office and returned later that day unable to void requiring replacement of a Foley catheter.  At his visit on 04/13/21, he continued with a foley catheter.  He continued to have issues with constipation although he had multiple bowel movements approximately 4 days prior.  No recent bowel movement.  He continues on MiraLAX and Senokot.  He also continued to take Flomax daily. He was continued with the Foley catheter.  He was changed to Rapaflo 8 mg daily in place of tamsulosin. Urine culture  grew >100 K MRSA.  He was treated with Bactrim.  He was evaluated with cystoscopy on 04/29/2021.  Cystoscopy showed no significant enlargement of the prostate, a large capacity bladder with few trabeculations, and mucosal edema consistent with chronic Foley catheter irritation.  He was unable to void following the procedure. He underwent evaluation with urodynamics on 05/25/2021.  The patient had a bladder capacity of 987 mL with decreased sensation.  There was instability noted with leakage.  The patient was able to generate a slight voluntary contraction and void a small amount with straining.  Max detrusor pressure was 20 cm of water with a max flow of 5 mL/s.  He underwent placement of a suprapubic catheter by interventional radiology on 06/23/2021. The patient pulled out his suprapubic catheter on 08/03/2021.  He underwent replacement of a suprapubic catheter by interventional radiology on 08/05/2021.  He again pulled out his catheter on 08/23/2021.  The catheter was replaced in the emergency department. Urine culture showed no growth. His SP tube was changed on 09/24/2021 with placement of a 16 French catheter.  He presents today for change of his suprapubic catheter.  His tube has been draining well.  No recent hematuria, fevers, or chills.  He continues to have some mucus drainage around the SP tube site.   Portions of the above documentation were copied from a prior visit for review purposes only.  Past Medical History:  Past Medical History:  Diagnosis Date  Schizophrenia [facility])     Past Surgical History:  Past Surgical History:  Procedure Laterality Date   IR CATHETER TUBE CHANGE  08/05/2021   SHOULDER SURGERY      Allergies:  No Known Allergies  Family History:  No family history on file.  Social History:  Social History   Tobacco Use   Smoking status: Every Day    Packs/day: 1.00    Types: Cigarettes  Vaping Use   Vaping Use: Never used  Substance Use Topics   Alcohol use:  Yes    Comment: occasionaly   Drug use: No    ROS: Constitutional:  Negative for fever, chills, weight loss CV: Negative for chest pain, previous MI, hypertension Respiratory:  Negative for shortness of breath, wheezing, sleep apnea, frequent cough GI:  Negative for nausea, vomiting, bloody stool, GERD   Physical exam: BP 122/77   Pulse 94  GENERAL APPEARANCE:  Well appearing, well developed, well nourished, NAD HEENT:  Atraumatic, normocephalic, oropharynx clear NECK:  Supple without lymphadenopathy or thyromegaly ABDOMEN:  Soft, non-tender, no masses; SP tube site intact EXTREMITIES:  Moves all extremities well, without clubbing, cyanosis, or edema NEUROLOGIC:  Alert and oriented x 3, CN II-XII grossly intact MENTAL STATUS:  appropriate BACK:  Non-tender to palpation, No CVAT SKIN:  Warm, dry, and intact  Results: None

## 2021-11-17 NOTE — Progress Notes (Signed)
Suprapubic Cath Change  Patient is present today for a suprapubic catheter change due to urinary retention.  5ml of water was drained from the balloon, a 16FR foley cath was removed from the tract with out difficulty.  Site was cleaned and prepped in a sterile fashion with betadine.  A 16FR foley cath was replaced into the tract no complications were noted. Urine return was noted, 10 ml of sterile water was inflated into the balloon and a bed bag was attached for drainage.  Patient tolerated well. A night bag was given to patient and proper instruction was given on how to switch bags.    Performed by: Osha Errico LPN  Follow up: 1 month NV

## 2021-12-14 ENCOUNTER — Ambulatory Visit: Payer: Medicare Other | Admitting: Physician Assistant

## 2021-12-14 ENCOUNTER — Encounter: Payer: Self-pay | Admitting: Physician Assistant

## 2021-12-14 ENCOUNTER — Ambulatory Visit (INDEPENDENT_AMBULATORY_CARE_PROVIDER_SITE_OTHER): Payer: Medicare Other | Admitting: Physician Assistant

## 2021-12-14 DIAGNOSIS — Z435 Encounter for attention to cystostomy: Secondary | ICD-10-CM | POA: Diagnosis not present

## 2021-12-14 DIAGNOSIS — L03818 Cellulitis of other sites: Secondary | ICD-10-CM

## 2021-12-14 DIAGNOSIS — R339 Retention of urine, unspecified: Secondary | ICD-10-CM

## 2021-12-14 DIAGNOSIS — N312 Flaccid neuropathic bladder, not elsewhere classified: Secondary | ICD-10-CM | POA: Diagnosis not present

## 2021-12-14 MED ORDER — CEPHALEXIN 500 MG PO CAPS: 500.0000 mg | ORAL_CAPSULE | Freq: Four times a day (QID) | ORAL | 0 refills | Status: AC

## 2021-12-14 NOTE — Progress Notes (Signed)
Assessment: 1. Encounter for care or replacement of suprapubic tube ([place])  2. Cellulitis of other specified site  3. Hypotonic bladder    Plan: Written and verbal instructions given to the patient's nephew for ostomy care.  Detailed instructions given for cleansing the area twice daily and keeping clothing from rubbing on the area constantly.  Keflex prescribed and if symptoms worsen, he is advised to bring the patient back for further evaluation.  Otherwise patient will return in 1 month for SP tube change.  Meds ordered this encounter  Medications   cephALEXin (KEFLEX) 500 MG capsule    Sig: Take 1 capsule (500 mg total) by mouth 4 (four) times daily for 7 days.    Dispense:  28 capsule    Refill:  0     Chief Complaint: No chief complaint on file.   HPI: [name] is a 66 y.o. male who presents for continued evaluation of indwelling suprapubic catheter and urinary retention.  Patient presented for routine SP tube change and clinical staff requested evaluation by provider for new finding of purulent drainage and erythema around the ostomy site.  Patient denies pain.  He has had no fever, chills.  Portions of the above documentation were copied from a prior visit for review purposes only.  Allergies: No Known Allergies  PMH: Past Medical History:  Diagnosis Date   Schizophrenia ([place])     PSH: Past Surgical History:  Procedure Laterality Date   IR CATHETER TUBE CHANGE  08/05/2021   SHOULDER SURGERY      SH: Social History   Tobacco Use   Smoking status: Every Day    Packs/day: 1.00    Types: Cigarettes  Vaping Use   Vaping Use: Never used  Substance Use Topics   Alcohol use: Yes    Comment: occasionaly   Drug use: No    ROS: All other review of systems were reviewed and are negative except what is noted above in HPI  PE: There were no vitals taken for this visit. GENERAL APPEARANCE:  Well appearing, well developed, well nourished, NAD HEENT:   Atraumatic, normocephalic NECK:  Supple. Trachea midline ABDOMEN:  Soft, mild tenderness and induration approximately 1 cm surrounding ostomy site.  The ostomy itself has purulent drainage with soupy discharge.  No surrounding fluctuance.  No purulence expressed with palpation. EXTREMITIES:  Moves all extremities well, without clubbing, cyanosis, or edema NEUROLOGIC:  Alert and cooperative SKIN:  Warm, dry, and intact.  See abdominal exam   Results: Laboratory Data: Lab Results  Component Value Date   WBC 6.1 06/23/2021   HGB 11.0 (L) 06/23/2021   HCT 33.4 (L) 06/23/2021   MCV 87.4 06/23/2021   PLT 206 06/23/2021    Lab Results  Component Value Date   CREATININE 0.83 03/20/2021    No results found for: "PSA"  No results found for: "TESTOSTERONE"  Lab Results  Component Value Date   HGBA1C (H) 06/29/2009    5.8 (NOTE)                                                                       According to the ADA Clinical Practice Recommendations for 2011, when HbA1c is used as a screening test:   >=  6.5%   Diagnostic of Diabetes Mellitus           (if abnormal result  is confirmed)  5.7-6.4%   Increased risk of developing Diabetes Mellitus  References:Diagnosis and Classification of Diabetes Mellitus,Diabetes Care,2011,34(Suppl 1):S62-S69 and Standards of Medical Care in         Diabetes - 2011,Diabetes Care,2011,34  (Suppl 1):S11-S61.    Urinalysis    Component Value Date/Time   COLORURINE AMBER (A) 08/23/2021 1509   APPEARANCEUR CLOUDY (A) 08/23/2021 1509   APPEARANCEUR Cloudy (A) 04/13/2021 1059   LABSPEC 1.027 08/23/2021 1509   PHURINE 5.0 08/23/2021 1509   GLUCOSEU NEGATIVE 08/23/2021 1509   HGBUR LARGE (A) 08/23/2021 1509   BILIRUBINUR NEGATIVE 08/23/2021 1509   BILIRUBINUR Negative 04/13/2021 1059   KETONESUR 5 (A) 08/23/2021 1509   PROTEINUR >=300 (A) 08/23/2021 1509   UROBILINOGEN 1.0 09/21/2012 0530   NITRITE NEGATIVE 08/23/2021 1509   LEUKOCYTESUR NEGATIVE  08/23/2021 1509    Lab Results  Component Value Date   LABMICR See below: 04/13/2021   WBCUA 6-10 (A) 04/13/2021   LABEPIT 0-10 04/13/2021   MUCUS Present 04/13/2021   BACTERIA RARE (A) 08/23/2021    Pertinent Imaging: No results found for this or any previous visit.  No results found for this or any previous visit.  No results found for this or any previous visit.  No results found for this or any previous visit.  No results found for this or any previous visit.  No valid procedures specified. No results found for this or any previous visit.  No results found for this or any previous visit.  No results found for this or any previous visit (from the past 24 hour(s)).

## 2021-12-14 NOTE — Progress Notes (Signed)
Suprapubic Cath Change  Patient is present today for a suprapubic catheter change due to urinary retention.  29ml of water was drained from the balloon, a 16FR foley cath was removed from the tract with out difficulty.  Site was cleaned and prepped in a sterile fashion with betadine.  A 16FR foley cath was replaced into the tract no complications were noted. Urine return was noted, 10 ml of sterile water was inflated into the balloon and a bed bag was attached for drainage.  Patient tolerated well.   Performed by: Levi Aland, CMA  Follow up: Sharee Pimple came in to evaluate SP tube.  Patient complained of burning around the insertion site.

## 2022-01-13 ENCOUNTER — Ambulatory Visit (INDEPENDENT_AMBULATORY_CARE_PROVIDER_SITE_OTHER): Payer: Medicare Other | Admitting: Physician Assistant

## 2022-01-13 DIAGNOSIS — R339 Retention of urine, unspecified: Secondary | ICD-10-CM | POA: Diagnosis not present

## 2022-01-13 NOTE — Progress Notes (Signed)
Suprapubic Cath Change  Patient is present today for a suprapubic catheter change due to urinary retention.  35ml of water was drained from the balloon, a 16FR foley cath was removed from the tract with out difficulty.  Site was cleaned and prepped in a sterile fashion with betadine.  A 16FR foley cath was replaced into the tract no complications were noted. Urine return was noted, 10 ml of sterile water was inflated into the balloon and a bed bag was attached for drainage.  Patient tolerated well.    Performed by: Guss Bunde, CMA  Follow up: as scheduled   Procedure reviewed. Agree with clinical documentation.  Julienne A Summerlin PA-C

## 2022-01-18 DIAGNOSIS — I10 Essential (primary) hypertension: Secondary | ICD-10-CM | POA: Diagnosis not present

## 2022-01-18 DIAGNOSIS — E119 Type 2 diabetes mellitus without complications: Secondary | ICD-10-CM | POA: Diagnosis not present

## 2022-01-18 DIAGNOSIS — E1165 Type 2 diabetes mellitus with hyperglycemia: Secondary | ICD-10-CM | POA: Diagnosis not present

## 2022-01-18 DIAGNOSIS — Z299 Encounter for prophylactic measures, unspecified: Secondary | ICD-10-CM | POA: Diagnosis not present

## 2022-01-18 DIAGNOSIS — Z9359 Other cystostomy status: Secondary | ICD-10-CM | POA: Diagnosis not present

## 2022-01-18 DIAGNOSIS — Z1211 Encounter for screening for malignant neoplasm of colon: Secondary | ICD-10-CM | POA: Diagnosis not present

## 2022-02-10 ENCOUNTER — Ambulatory Visit (INDEPENDENT_AMBULATORY_CARE_PROVIDER_SITE_OTHER): Payer: Medicare Other | Admitting: Urology

## 2022-02-10 DIAGNOSIS — R339 Retention of urine, unspecified: Secondary | ICD-10-CM

## 2022-02-10 NOTE — Progress Notes (Signed)
Suprapubic Cath Change  Patient is present today for a suprapubic catheter change due to urinary retention.  10 ml of water was drained from the balloon, a 16 FR foley cath was removed from the tract with out difficulty.  Site was cleaned and prepped in a sterile fashion with betadine.  A 16 FR foley cath was replaced into the tract no complications were noted. Flush the suprapubic cath to ensure placement. 10 ml of sterile water was inflated into the balloon and a overnight bag was attached for drainage.  Patient tolerated well. A night bag was given to patient and proper instruction was given on how to switch bags.    Performed by: RCVELFYB CMA  Follow up: Follow up for next cath change  1 month

## 2022-03-15 ENCOUNTER — Ambulatory Visit (INDEPENDENT_AMBULATORY_CARE_PROVIDER_SITE_OTHER): Payer: 59 | Admitting: Urology

## 2022-03-15 DIAGNOSIS — R339 Retention of urine, unspecified: Secondary | ICD-10-CM

## 2022-03-15 MED ORDER — "GAUZE PADS & DRESSINGS 4""X4"" PADS": 1.0000 | MEDICATED_PAD | Freq: Every day | 11 refills | Status: DC | PRN

## 2022-03-15 NOTE — Progress Notes (Signed)
Suprapubic Cath Change  Patient is present today for a suprapubic catheter change due to urinary retention.  12ml of water was drained from the balloon, a 16FR foley cath was removed from the tract with out difficulty.  Site was cleaned and prepped in a sterile fashion with betadine.  A 16FR foley cath was replaced into the tract no complications were noted. Urine return was noted, 10 ml of sterile water was inflated into the balloon and a bed bag was attached for drainage.  Patient tolerated well.   Performed by: Levi Aland, CMA  Follow up: Follow up as scheduled.

## 2022-04-15 ENCOUNTER — Ambulatory Visit: Payer: 59

## 2022-04-21 ENCOUNTER — Ambulatory Visit: Payer: 59

## 2022-04-26 ENCOUNTER — Ambulatory Visit (INDEPENDENT_AMBULATORY_CARE_PROVIDER_SITE_OTHER): Payer: 59 | Admitting: Urology

## 2022-04-26 DIAGNOSIS — R339 Retention of urine, unspecified: Secondary | ICD-10-CM

## 2022-04-26 NOTE — Progress Notes (Addendum)
Suprapubic Cath Change  Patient is present today for a suprapubic catheter change due to urinary retention.  70m of water was drained from the balloon, a 16FR foley cath was removed from the tract with out difficulty.  Site was cleaned and prepped in a sterile fashion with betadine.  A 16FR foley cath was replaced into the tract no complications were noted. Urine return was noted, 10 ml of sterile water was inflated into the balloon and a bedside bag was attached for drainage.  A night bag was given to patient and proper instruction was given on how to switch bags.    Performed by: Obrien Huskins LPN  Follow up: keep next scheduled NV

## 2022-05-18 DIAGNOSIS — R6889 Other general symptoms and signs: Secondary | ICD-10-CM | POA: Diagnosis not present

## 2022-05-18 DIAGNOSIS — K439 Ventral hernia without obstruction or gangrene: Secondary | ICD-10-CM | POA: Diagnosis not present

## 2022-05-18 DIAGNOSIS — N179 Acute kidney failure, unspecified: Secondary | ICD-10-CM | POA: Diagnosis not present

## 2022-05-18 DIAGNOSIS — Z87891 Personal history of nicotine dependence: Secondary | ICD-10-CM | POA: Diagnosis not present

## 2022-05-18 DIAGNOSIS — E876 Hypokalemia: Secondary | ICD-10-CM | POA: Diagnosis not present

## 2022-05-18 DIAGNOSIS — K56609 Unspecified intestinal obstruction, unspecified as to partial versus complete obstruction: Secondary | ICD-10-CM | POA: Diagnosis not present

## 2022-05-18 DIAGNOSIS — K8020 Calculus of gallbladder without cholecystitis without obstruction: Secondary | ICD-10-CM | POA: Diagnosis not present

## 2022-05-18 DIAGNOSIS — Z791 Long term (current) use of non-steroidal anti-inflammatories (NSAID): Secondary | ICD-10-CM | POA: Diagnosis not present

## 2022-05-18 DIAGNOSIS — E039 Hypothyroidism, unspecified: Secondary | ICD-10-CM | POA: Diagnosis not present

## 2022-05-18 DIAGNOSIS — L02212 Cutaneous abscess of back [any part, except buttock]: Secondary | ICD-10-CM | POA: Diagnosis not present

## 2022-05-18 DIAGNOSIS — K5989 Other specified functional intestinal disorders: Secondary | ICD-10-CM | POA: Diagnosis not present

## 2022-05-18 DIAGNOSIS — K219 Gastro-esophageal reflux disease without esophagitis: Secondary | ICD-10-CM | POA: Diagnosis not present

## 2022-05-18 DIAGNOSIS — E119 Type 2 diabetes mellitus without complications: Secondary | ICD-10-CM | POA: Diagnosis not present

## 2022-05-18 DIAGNOSIS — K6389 Other specified diseases of intestine: Secondary | ICD-10-CM | POA: Diagnosis not present

## 2022-05-18 DIAGNOSIS — T68XXXA Hypothermia, initial encounter: Secondary | ICD-10-CM | POA: Diagnosis not present

## 2022-05-18 DIAGNOSIS — Z1152 Encounter for screening for COVID-19: Secondary | ICD-10-CM | POA: Diagnosis not present

## 2022-05-18 DIAGNOSIS — I959 Hypotension, unspecified: Secondary | ICD-10-CM | POA: Diagnosis not present

## 2022-05-18 DIAGNOSIS — Z79899 Other long term (current) drug therapy: Secondary | ICD-10-CM | POA: Diagnosis not present

## 2022-05-18 DIAGNOSIS — I10 Essential (primary) hypertension: Secondary | ICD-10-CM | POA: Diagnosis not present

## 2022-05-18 DIAGNOSIS — Z743 Need for continuous supervision: Secondary | ICD-10-CM | POA: Diagnosis not present

## 2022-05-18 DIAGNOSIS — Z4659 Encounter for fitting and adjustment of other gastrointestinal appliance and device: Secondary | ICD-10-CM | POA: Diagnosis not present

## 2022-05-18 DIAGNOSIS — R531 Weakness: Secondary | ICD-10-CM | POA: Diagnosis not present

## 2022-05-18 DIAGNOSIS — D649 Anemia, unspecified: Secondary | ICD-10-CM | POA: Diagnosis not present

## 2022-05-18 DIAGNOSIS — Z960 Presence of urogenital implants: Secondary | ICD-10-CM | POA: Diagnosis not present

## 2022-05-18 DIAGNOSIS — R6510 Systemic inflammatory response syndrome (SIRS) of non-infectious origin without acute organ dysfunction: Secondary | ICD-10-CM | POA: Diagnosis not present

## 2022-05-18 DIAGNOSIS — Z4682 Encounter for fitting and adjustment of non-vascular catheter: Secondary | ICD-10-CM | POA: Diagnosis not present

## 2022-05-18 DIAGNOSIS — K56600 Partial intestinal obstruction, unspecified as to cause: Secondary | ICD-10-CM | POA: Diagnosis not present

## 2022-05-18 DIAGNOSIS — E785 Hyperlipidemia, unspecified: Secondary | ICD-10-CM | POA: Diagnosis not present

## 2022-05-27 ENCOUNTER — Ambulatory Visit: Payer: 59

## 2022-05-31 DIAGNOSIS — E1165 Type 2 diabetes mellitus with hyperglycemia: Secondary | ICD-10-CM | POA: Diagnosis not present

## 2022-05-31 DIAGNOSIS — I10 Essential (primary) hypertension: Secondary | ICD-10-CM | POA: Diagnosis not present

## 2022-05-31 DIAGNOSIS — K5900 Constipation, unspecified: Secondary | ICD-10-CM | POA: Diagnosis not present

## 2022-05-31 DIAGNOSIS — Z299 Encounter for prophylactic measures, unspecified: Secondary | ICD-10-CM | POA: Diagnosis not present

## 2022-06-07 ENCOUNTER — Ambulatory Visit (INDEPENDENT_AMBULATORY_CARE_PROVIDER_SITE_OTHER): Payer: 59 | Admitting: Urology

## 2022-06-07 DIAGNOSIS — R339 Retention of urine, unspecified: Secondary | ICD-10-CM

## 2022-06-07 NOTE — Progress Notes (Addendum)
Suprapubic Cath Change  Patient is present today for a suprapubic catheter change due to urinary retention.  37ml of water was drained from the balloon, a 14FR foley cath was removed from the tract with out difficulty.  Site was cleaned and prepped in a sterile fashion with betadine.  A 16FR foley cath was replaced into the tract no complications were noted. Urine return was noted, 10 ml of sterile water was inflated into the balloon and a bedside bag was attached for drainage.  Patient tolerated well. A night bag was given to patient and proper instruction was given on how to switch bags.    Performed by: Hooria Gasparini LPN  Follow up: Keep scheduled NV

## 2022-06-16 DIAGNOSIS — Z515 Encounter for palliative care: Secondary | ICD-10-CM | POA: Diagnosis not present

## 2022-06-17 DIAGNOSIS — Z299 Encounter for prophylactic measures, unspecified: Secondary | ICD-10-CM | POA: Diagnosis not present

## 2022-06-17 DIAGNOSIS — I10 Essential (primary) hypertension: Secondary | ICD-10-CM | POA: Diagnosis not present

## 2022-06-17 DIAGNOSIS — E119 Type 2 diabetes mellitus without complications: Secondary | ICD-10-CM | POA: Diagnosis not present

## 2022-06-24 ENCOUNTER — Ambulatory Visit: Payer: 59

## 2022-07-22 ENCOUNTER — Ambulatory Visit (INDEPENDENT_AMBULATORY_CARE_PROVIDER_SITE_OTHER): Payer: 59

## 2022-07-22 DIAGNOSIS — R339 Retention of urine, unspecified: Secondary | ICD-10-CM

## 2022-07-22 NOTE — Progress Notes (Addendum)
Suprapubic Cath Change  Patient is present today for a suprapubic catheter change due to urinary retention.  10 ml of water was drained from the balloon, a 16 FR foley cath was removed from the tract with out difficulty.  Site was cleaned and prepped in a sterile fashion with betadine.  A 16 FR foley cath was replaced into the tract no complications were noted. Flush return was noted, 10 ml of sterile water was inflated into the balloon and a overnight bag was attached for drainage.  Patient tolerated well. A night bag was given to patient and proper instruction was given on how to switch bags.    Performed by: Kennyth Lose, CMA  Follow up: No follow-ups on file.

## 2022-07-29 DIAGNOSIS — E7800 Pure hypercholesterolemia, unspecified: Secondary | ICD-10-CM | POA: Diagnosis not present

## 2022-07-29 DIAGNOSIS — Z0000 Encounter for general adult medical examination without abnormal findings: Secondary | ICD-10-CM | POA: Diagnosis not present

## 2022-07-29 DIAGNOSIS — Z79899 Other long term (current) drug therapy: Secondary | ICD-10-CM | POA: Diagnosis not present

## 2022-07-29 DIAGNOSIS — I10 Essential (primary) hypertension: Secondary | ICD-10-CM | POA: Diagnosis not present

## 2022-07-29 DIAGNOSIS — R5383 Other fatigue: Secondary | ICD-10-CM | POA: Diagnosis not present

## 2022-07-29 DIAGNOSIS — Z7189 Other specified counseling: Secondary | ICD-10-CM | POA: Diagnosis not present

## 2022-07-29 DIAGNOSIS — Z299 Encounter for prophylactic measures, unspecified: Secondary | ICD-10-CM | POA: Diagnosis not present

## 2022-07-29 DIAGNOSIS — Z87891 Personal history of nicotine dependence: Secondary | ICD-10-CM | POA: Diagnosis not present

## 2022-07-29 DIAGNOSIS — E039 Hypothyroidism, unspecified: Secondary | ICD-10-CM | POA: Diagnosis not present

## 2022-08-23 ENCOUNTER — Ambulatory Visit: Payer: 59

## 2022-08-30 ENCOUNTER — Ambulatory Visit (INDEPENDENT_AMBULATORY_CARE_PROVIDER_SITE_OTHER): Payer: 59

## 2022-08-30 DIAGNOSIS — R339 Retention of urine, unspecified: Secondary | ICD-10-CM | POA: Diagnosis not present

## 2022-08-30 NOTE — Progress Notes (Signed)
Suprapubic Cath Change  Patient is present today for a suprapubic catheter change due to urinary retention.  10ml of water was drained from the balloon, a 16FR foley cath was removed from the tract with out difficulty.  Site was cleaned and prepped in a sterile fashion with betadine.  A 16FR foley cath was replaced into the tract no complications were noted. Urine return was noted, 10 ml of sterile water was inflated into the balloon and a bed bag was attached for drainage.  Patient tolerated well. A night bag was given to patient and proper instruction was given on how to switch bags.    Performed by: Guss Bunde, CMA  Follow up: as scheduled.

## 2022-10-04 ENCOUNTER — Ambulatory Visit: Payer: 59

## 2022-10-04 DIAGNOSIS — R339 Retention of urine, unspecified: Secondary | ICD-10-CM

## 2022-10-04 NOTE — Progress Notes (Signed)
Suprapubic Cath Change  Patient is present today for a suprapubic catheter change due to urinary retention.  10ml of water was drained from the balloon, a 16FR foley cath was removed from the tract with out difficulty.  Site was cleaned and prepped in a sterile fashion with betadine.  A 16FR foley cath was replaced into the tract no complications were noted. Urine return was noted, 10 ml of sterile water was inflated into the balloon and a bedside bag was attached for drainage.  Patient tolerated well.  Performed by: Parthiv Mucci LPN  Follow up: keep scheduled f/u

## 2022-10-05 ENCOUNTER — Telehealth: Payer: Self-pay

## 2022-10-05 NOTE — Telephone Encounter (Signed)
Patient nephew called the office to inquire about yeast cream for patient penis. Chronic catheter patient.  Nurse visit catheter changed this week. Nephew asked if nurse that completed the catheter change could speak with MD in office to have cream ordered for patient.

## 2022-10-06 ENCOUNTER — Other Ambulatory Visit: Payer: Self-pay

## 2022-10-06 MED ORDER — NYSTATIN 100000 UNIT/GM EX CREA: 1.0000 | TOPICAL_CREAM | Freq: Two times a day (BID) | CUTANEOUS | 1 refills | Status: DC | PRN

## 2022-10-06 NOTE — Telephone Encounter (Signed)
Ok to send refill per Dr. Retta Diones. Mr [name] called and made aware.

## 2022-10-31 ENCOUNTER — Telehealth: Payer: Self-pay | Admitting: Urology

## 2022-11-03 ENCOUNTER — Ambulatory Visit (INDEPENDENT_AMBULATORY_CARE_PROVIDER_SITE_OTHER): Payer: 59

## 2022-11-03 DIAGNOSIS — R339 Retention of urine, unspecified: Secondary | ICD-10-CM | POA: Diagnosis not present

## 2022-11-03 NOTE — Progress Notes (Signed)
Suprapubic Cath Change  Patient is present today for a suprapubic catheter change due to urinary retention.  10 ml of water was drained from the balloon, a 16 FR foley cath was removed from the tract with out difficulty.  Site was cleaned and prepped in a sterile fashion with betadine.  A 16 FR foley cath was replaced into the tract no complications were noted. flush return was noted, 10 ml of sterile water was inflated into the balloon and a night bag was attached for drainage.  Patient tolerated well. A night bag was given to patient and proper instruction was given on how to switch bags.    Performed by: Kennyth Lose, CMA  Follow up: No follow-ups on file.

## 2022-11-03 NOTE — Telephone Encounter (Signed)
Called patient to confirmed if he need a refill on his nystatin cream. Patient's nephew states patient do not need a refill on Nystatin cream. Voiced understanding

## 2022-11-05 DIAGNOSIS — E1165 Type 2 diabetes mellitus with hyperglycemia: Secondary | ICD-10-CM | POA: Diagnosis not present

## 2022-11-05 DIAGNOSIS — L02214 Cutaneous abscess of groin: Secondary | ICD-10-CM | POA: Diagnosis not present

## 2022-11-05 DIAGNOSIS — Z299 Encounter for prophylactic measures, unspecified: Secondary | ICD-10-CM | POA: Diagnosis not present

## 2022-11-05 DIAGNOSIS — I10 Essential (primary) hypertension: Secondary | ICD-10-CM | POA: Diagnosis not present

## 2022-12-01 DIAGNOSIS — I10 Essential (primary) hypertension: Secondary | ICD-10-CM | POA: Diagnosis not present

## 2022-12-01 DIAGNOSIS — Z0000 Encounter for general adult medical examination without abnormal findings: Secondary | ICD-10-CM | POA: Diagnosis not present

## 2022-12-01 DIAGNOSIS — Z23 Encounter for immunization: Secondary | ICD-10-CM | POA: Diagnosis not present

## 2022-12-01 DIAGNOSIS — Z299 Encounter for prophylactic measures, unspecified: Secondary | ICD-10-CM | POA: Diagnosis not present

## 2022-12-01 DIAGNOSIS — E1165 Type 2 diabetes mellitus with hyperglycemia: Secondary | ICD-10-CM | POA: Diagnosis not present

## 2022-12-06 ENCOUNTER — Ambulatory Visit (INDEPENDENT_AMBULATORY_CARE_PROVIDER_SITE_OTHER): Payer: 59

## 2022-12-06 DIAGNOSIS — R339 Retention of urine, unspecified: Secondary | ICD-10-CM

## 2022-12-06 NOTE — Progress Notes (Addendum)
Suprapubic Cath Change  Patient is present today for a suprapubic catheter change due to urinary retention.  10ml of water was drained from the balloon, a 16FR foley cath was removed from the tract with out difficulty.  Site was cleaned and prepped in a sterile fashion with betadine.  A 16FR foley cath was replaced into the tract no complications were noted. Urine return was noted, 10 ml of sterile water was inflated into the balloon and a bed bag was attached for drainage.  Patient tolerated well. A night bag was given to patient and proper instruction was given on how to switch bags.    Performed by: Guss Bunde, CMA  Follow up: with scheduled with MD to establish care.  Pt last office visit was on 10/2021 with Dr. Pete Glatter.

## 2023-01-03 NOTE — Progress Notes (Signed)
History of Present Illness:  [name] is a 67 y.o. 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   He presented to [facility] on 03/17/2021 with altered mental status.  He does have a history of schizophrenia.  He was found to have a distended abdomen on presentation.  A Foley catheter was placed with return of 2300 mL of urine.  CT imaging from 03/18/2021 demonstrated no stones or hydronephrosis, no renal masses, and a large stool burden throughout the colon.  He was started on Flomax.  He was treated for constipation.  His creatinine was 2.69 on admission and improved to 0.83 prior to discharge on 03/20/2021. At his initial visit on 03/27/2021, his Foley catheter was draining well.  He continued to have problems with constipation.  He was taking MiraLAX and Senokot S.  No prior history of retention. His nephew reports that he has been pulling at the Foley catheter during periods of confusion. A voiding trial was attempted on 03/27/2021 but was unsuccessful and the catheter was replaced. The patient returned for another voiding trial on 04/06/2021.  He was unable to void in the office and returned later that day unable to void requiring replacement of a Foley catheter.  At his visit on 04/13/21, he continued with a foley catheter.  He continued to have issues with constipation although he had multiple bowel movements approximately 4 days prior.  No recent bowel movement.  He continues on MiraLAX and Senokot.  He also continued to take Flomax daily. He was continued with the Foley catheter.  He was changed to Rapaflo 8 mg daily in place of tamsulosin. Urine culture grew >100 K MRSA.  He was treated with Bactrim.  He was evaluated with cystoscopy on 04/29/2021.  Cystoscopy showed no significant enlargement of the prostate, a large capacity bladder with few trabeculations, and mucosal edema consistent with chronic Foley catheter irritation.  He was unable to void  following the procedure. He underwent evaluation with urodynamics on 05/25/2021.  The patient had a bladder capacity of 987 mL with decreased sensation.  There was instability noted with leakage.  The patient was able to generate a slight voluntary contraction and void a small amount with straining.  Max detrusor pressure was 20 cm of water with a max flow of 5 mL/s.  He underwent placement of a suprapubic catheter by interventional radiology on 06/23/2021. The patient pulled out his suprapubic catheter on 08/03/2021.  He underwent replacement of a suprapubic catheter by interventional radiology on 08/05/2021.  He again pulled out his catheter on 08/23/2021.  The catheter was replaced in the emergency department. Urine culture showed no growth. His SP tube was changed on 09/24/2021 with placement of a 16 French catheter.  He presents today for change of his suprapubic catheter.  His tube has been draining well.  No recent hematuria, fevers, or chills.  He continues to have some mucus drainage around the SP tube site.  11.12.2024: This is my first visit with [name].  Previously, he has been followed by Dr. Pete Glatter.  Please see above history (this has been reviewed by me).  He knows that there have been no problems with his catheter since his last suprapubic tube change here.  He does have some tenderness in his skin surrounding the tube site, however.   Portions of the above documentation were copied from a prior visit for review purposes only.  Past Medical History:  Past Medical History:  Diagnosis Date   Schizophrenia [facility])     Past Surgical History:  Past Surgical History:  Procedure Laterality Date   IR CATHETER TUBE CHANGE  08/05/2021   SHOULDER SURGERY      Allergies:  No Known Allergies  Family History:  No family history on file.  Social History:  Social History   Tobacco Use   Smoking status: Every Day    Current packs/day: 1.00    Types: Cigarettes  Vaping Use   Vaping  status: Never Used  Substance Use Topics   Alcohol use: Yes    Comment: occasionaly   Drug use: No    ROS: Constitutional:  Negative for fever, chills, weight loss CV: Negative for chest pain, previous MI, hypertension Respiratory:  Negative for shortness of breath, wheezing, sleep apnea, frequent cough GI:  Negative for nausea, vomiting, bloody stool, GERD   Physical exam: There were no vitals taken for this visit. GENERAL APPEARANCE:  Well appearing, well developed, well nourished, NAD HEENT:  Atraumatic, normocephalic, oropharynx clear NECK:  Supple without lymphadenopathy or thyromegaly ABDOMEN:  Soft, non-tender, no masses; SP tube site intact.  There is significant granulation tissue surrounding the suprapubic tube site as well as cellulitic erythema approximately 3 cm radius around the suprapubic tube site. EXTREMITIES:  Moves all extremities well, without clubbing, cyanosis, or edema NEUROLOGIC:  Alert and oriented x 3, CN II-XII grossly intact MENTAL STATUS:  appropriate BACK:  Non-tender to palpation, No CVAT SKIN:  Warm, dry, and intact  Results: None  Cauterization with silver nitrate sticks was performed on the granulation tissue.  Assessment:  1.  Neurogenic bladder with indwelling suprapubic tube  2.  Granulation tissue of tube entry site, cauterized today  3.  Probable cellulitis  Plan:  1.  I put him on 1 weeks worth of doxycycline  2.  Nurse visit in 1 week to check wound  3.  Nurse visit/office visit with me in 1 month for suprapubic tube change/repeat cauterization

## 2023-01-04 ENCOUNTER — Encounter: Payer: Self-pay | Admitting: Urology

## 2023-01-04 ENCOUNTER — Ambulatory Visit (INDEPENDENT_AMBULATORY_CARE_PROVIDER_SITE_OTHER): Payer: 59 | Admitting: Urology

## 2023-01-04 VITALS — BP 149/79 | HR 97 | Ht 70.0 in | Wt 195.0 lb

## 2023-01-04 DIAGNOSIS — N312 Flaccid neuropathic bladder, not elsewhere classified: Secondary | ICD-10-CM

## 2023-01-04 DIAGNOSIS — R339 Retention of urine, unspecified: Secondary | ICD-10-CM

## 2023-01-04 DIAGNOSIS — L929 Granulomatous disorder of the skin and subcutaneous tissue, unspecified: Secondary | ICD-10-CM | POA: Diagnosis not present

## 2023-01-04 DIAGNOSIS — Z435 Encounter for attention to cystostomy: Secondary | ICD-10-CM

## 2023-01-04 DIAGNOSIS — L03818 Cellulitis of other sites: Secondary | ICD-10-CM

## 2023-01-04 MED ORDER — DOXYCYCLINE HYCLATE 100 MG PO TABS
100.0000 mg | ORAL_TABLET | Freq: Two times a day (BID) | ORAL | 0 refills | Status: DC
Start: 2023-01-04 — End: 2023-06-24

## 2023-01-04 NOTE — Progress Notes (Signed)
Suprapubic Cath Change  Patient is present today for a suprapubic catheter change due to urinary retention.  50ml of water was drained from the balloon, a 16FR foley cath was removed from the tract with out difficulty.  Site was cleaned and prepped in a sterile fashion with betadine.  A 16FR foley cath was replaced into the tract no complications were noted. Urine return was noted, 10 ml of sterile water was inflated into the balloon and a bed bag was attached for drainage.  Patient tolerated well. A night bag was given to patient and proper instruction was given on how to switch bags.    Performed by: Levi Aland, CMA  Follow up: MD to see after.

## 2023-01-05 ENCOUNTER — Ambulatory Visit: Payer: 59

## 2023-01-14 ENCOUNTER — Ambulatory Visit: Payer: 59

## 2023-01-14 DIAGNOSIS — Z435 Encounter for attention to cystostomy: Secondary | ICD-10-CM

## 2023-01-14 NOTE — Progress Notes (Signed)
Patient presents today for sp tube site evaluation per Dr. Retta Diones.  Upon evaluation, sp tube site healing as expected after silver nitrate was applied to granulation tissue.  A new guaze pad was applied over the site during visit.  Patient had redness from his catheter anchor, I removed anchor and replaced a new anchor and instructed patient not to reapply to previous site until the area had healed.  Patient voiced understanding and I provided an extra anchor.  Patient will follow up as scheduled with MD.

## 2023-01-18 IMAGING — CT CT IMAGE GUIDED DRAINAGE BY PERCUTANEOUS CATHETER
2 of 4 series · 12 of 32 positions shown, 18 images · non-contrast
Comparison: None

INDICATION: 65-year-old male referred for suprapubic catheter placement

EXAM:
CT IMAGE GUIDED DRAINAGE BY PERCUTANEOUS CATHETER

[Series 2: i-spiral 5.0 b40f · axial · 0.98mm/px · z∈[+672,+826]mm · 9 of 56 slices shown, 15 images (1 of 2)]
[im 6/56  soft-tissue]
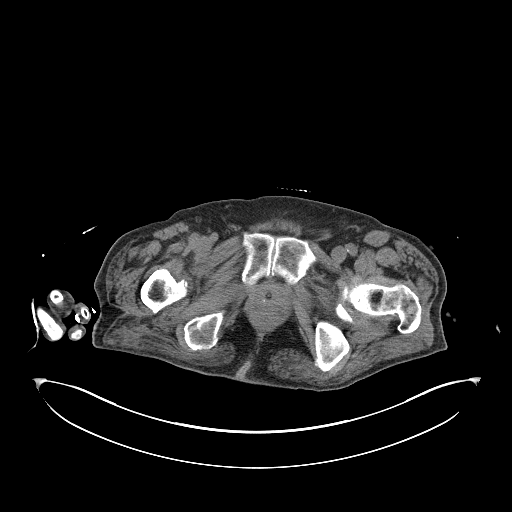
[im 6/56  bone]
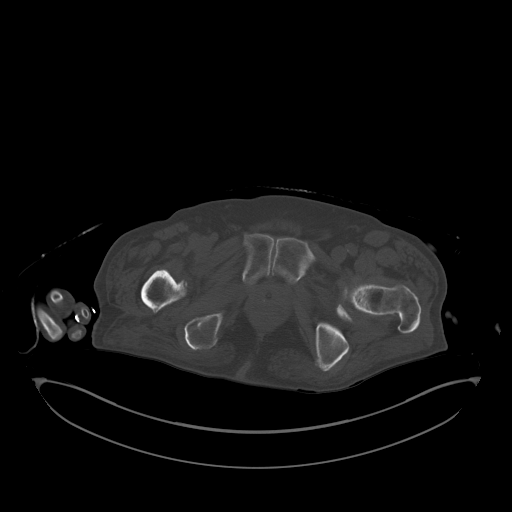
[im 12/56  soft-tissue]
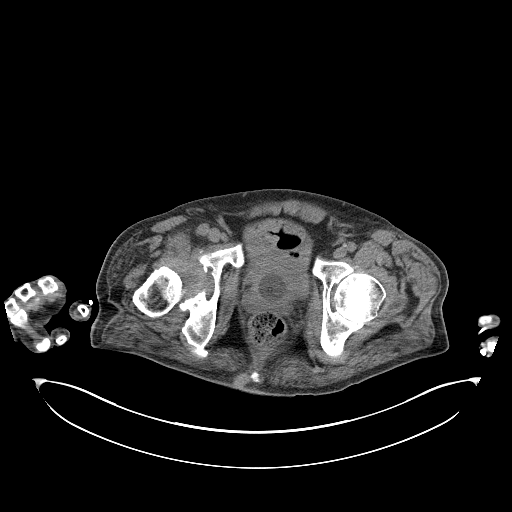
[im 17/56  soft-tissue]
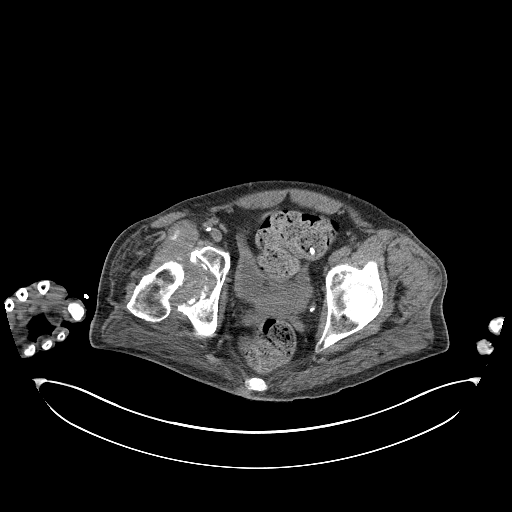
[im 23/56  soft-tissue]
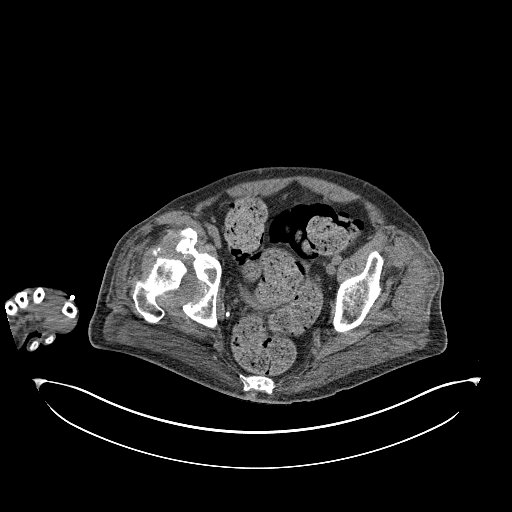
[im 28/56  soft-tissue]
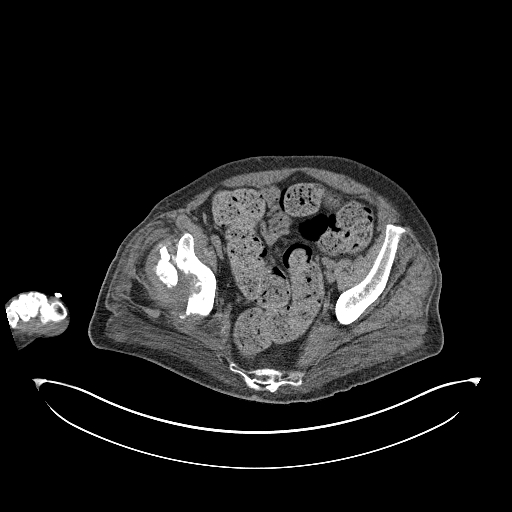
[im 34/56  soft-tissue]
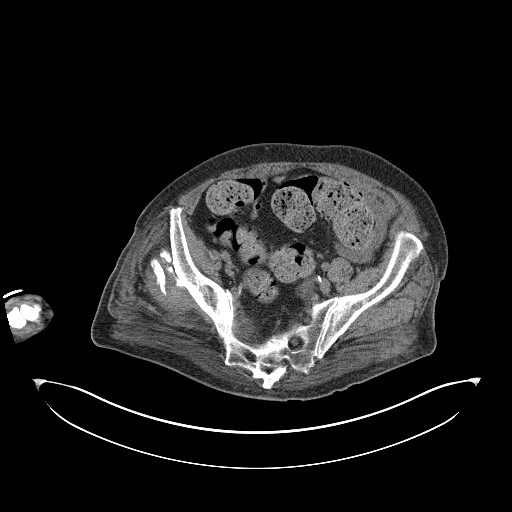
[im 34/56  lung]
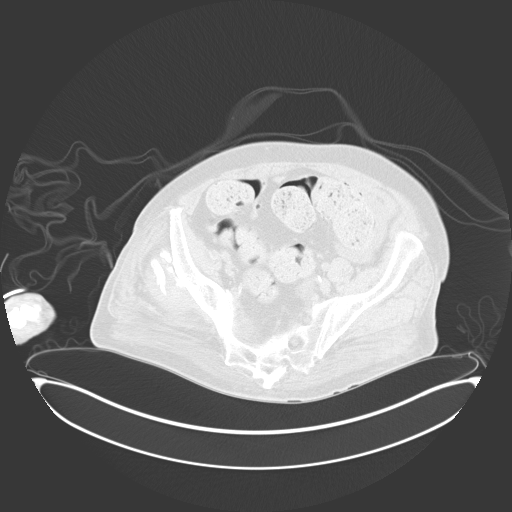
[im 39/56  soft-tissue]
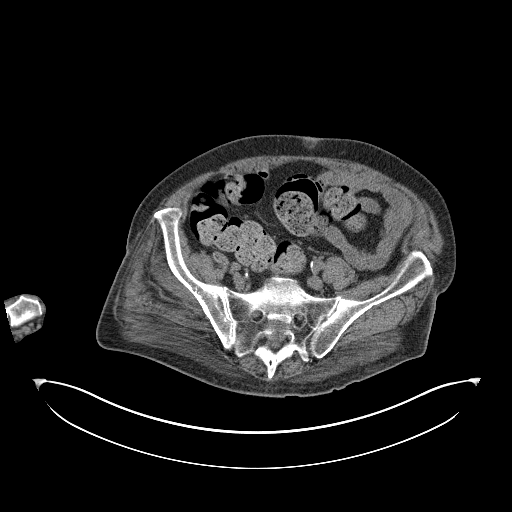
[im 39/56  lung]
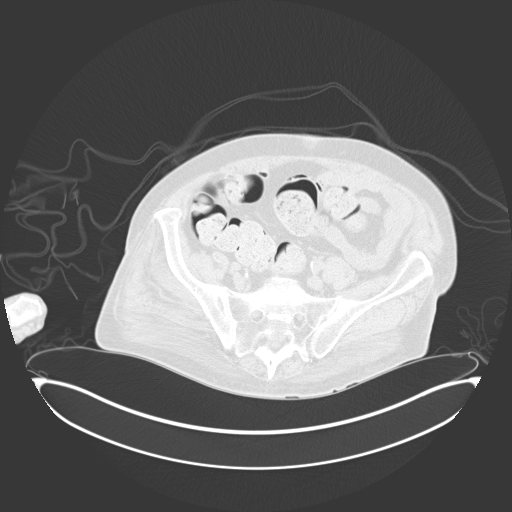
[im 45/56  soft-tissue]
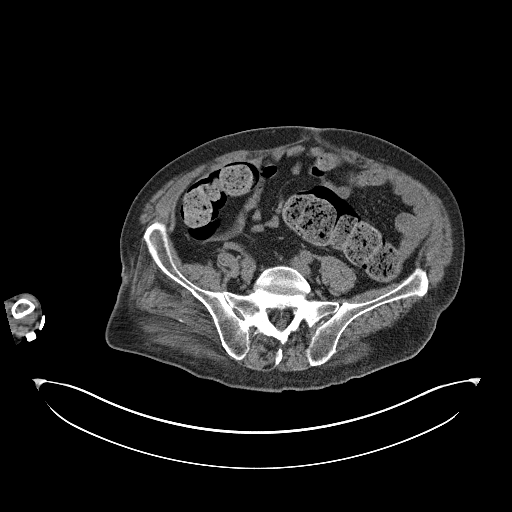
[im 45/56  lung]
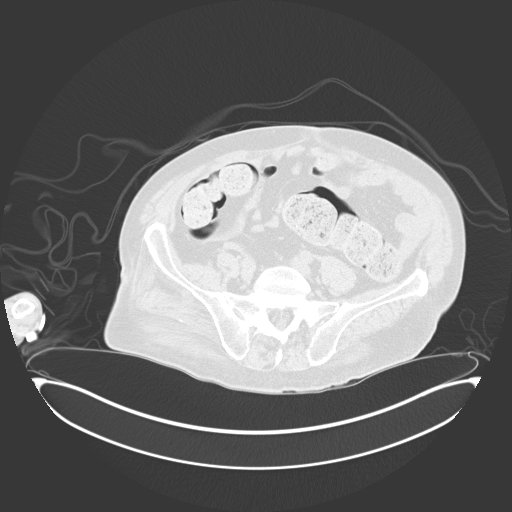
[im 50/56  soft-tissue]
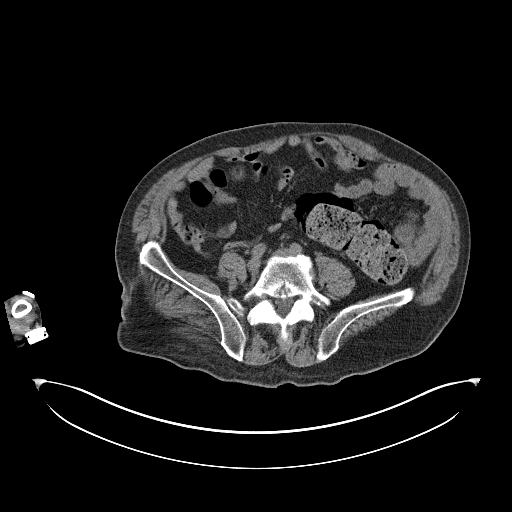
[im 50/56  lung]
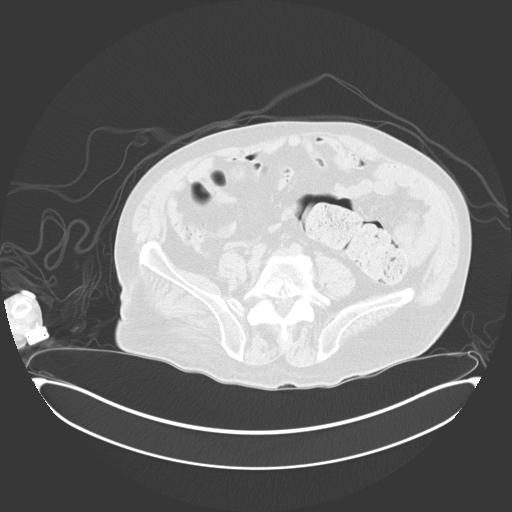
[im 50/56  bone]
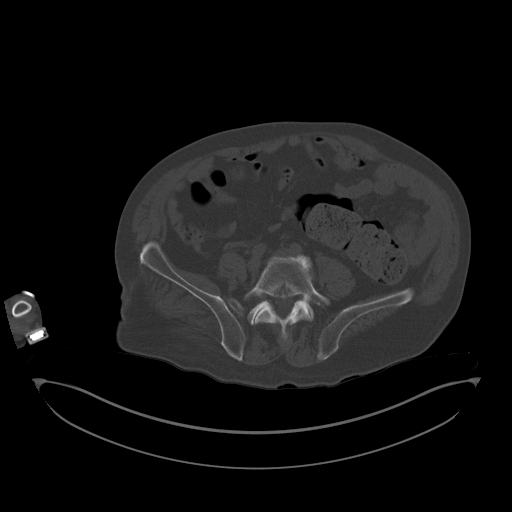

[Series 3: i-spiral 5.0 b40f · axial · 0.98mm/px · z∈[+676,+718]mm · 3 of 56 slices shown (2 of 2)]
[im 7/56  soft-tissue]
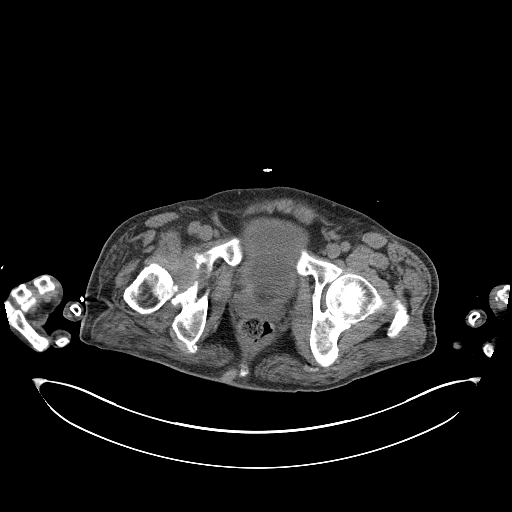
[im 13/56  soft-tissue]
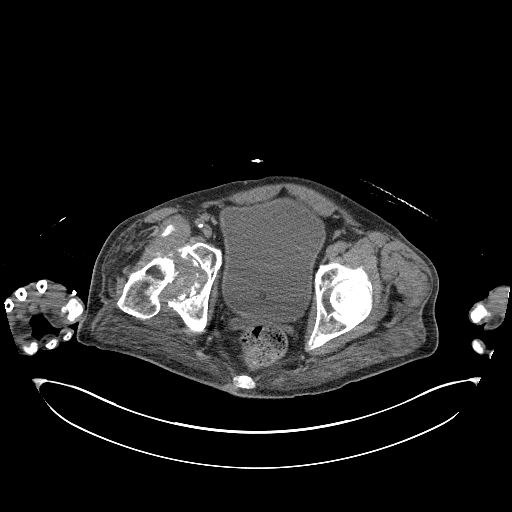
[im 19/56  soft-tissue]
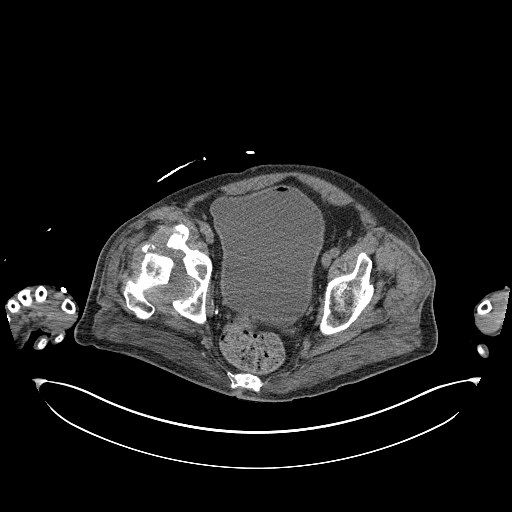

[12 of 32 positions shown; findings below may reference images not displayed]

MEDICATIONS:
None

ANESTHESIA/SEDATION:
Fentanyl 50 mcg IV; Versed 1.0 mg IV

Moderate Sedation Time:  18 minutes

The patient was continuously monitored during the procedure by the
interventional radiology nurse under my direct supervision.

CONTRAST:  None

FLUOROSCOPY TIME:  CT

COMPLICATIONS:
None

PROCEDURE:
Informed written consent was obtained from the patient and the
patient's family after a thorough discussion of the procedural
risks, benefits and alternatives. All questions were addressed.
Maximal Sterile Barrier Technique was utilized including caps, mask,
sterile gowns, sterile gloves, sterile drape, hand hygiene and skin
antiseptic. A timeout was performed prior to the initiation of the
procedure.

Patient position in the supine position on the CT gantry table.

Scout CT was acquired for planning purposes.

The suprapubic region was prepped with chlorhexidine in a sterile
fashion, and a sterile drape was applied covering the operative
field. A sterile gown and sterile gloves were used for the
procedure. Local anesthesia was provided with 1% Lidocaine.

Indwelling Foley catheter had been clamped prior to the procedure,
with installation of approximately 300 cc of normal saline.

The skin and subcutaneous tissues were then generously infiltrated
with 1% lidocaine to the level of the anterior wall of the bladder
for local anesthesia.

A small stab incision was made in the skin, and a Yueh needle was
advanced until urine/saline was aspirated. Catheter was advanced and
an Amplatz wire was placed.

Using modified Seldinger technique, a 16 Chavener Finette was
placed into the urinary bladder. Spontaneous saline/urine drained
through the tube confirming position.

Catheter was sutured in position.  Final image was stored.

Gravity drain was attached.

Patient tolerated the procedure well and remained hemodynamically
stable throughout.

No complications were encountered and no significant blood loss was
encountered.
IMPRESSION: Status post CT-guided suprapubic catheter placement, with a 16
Chavener Finette placed.

PLAN:
A new 16 French Foley catheter can be placed after a 4-6 week
healing process.

## 2023-01-31 NOTE — Progress Notes (Signed)
History of Present Illness:  Here for follow-up visit--I saw him initially on 12 November.  The note follows:   He presented to [facility] on 03/17/2021 with altered mental status.  He does have a history of schizophrenia.  He was found to have a distended abdomen on presentation.  A Foley catheter was placed with return of 2300 mL of urine.  CT imaging from 03/18/2021 demonstrated no stones or hydronephrosis, no renal masses, and a large stool burden throughout the colon.  He was started on Flomax.  He was treated for constipation.  His creatinine was 2.69 on admission and improved to 0.83 prior to discharge on 03/20/2021. At his initial visit on 03/27/2021, his Foley catheter was draining well.  He continued to have problems with constipation.  He was taking MiraLAX and Senokot S.  No prior history of retention. His nephew reports that he has been pulling at the Foley catheter during periods of confusion. A voiding trial was attempted on 03/27/2021 but was unsuccessful and the catheter was replaced. The patient returned for another voiding trial on 04/06/2021.  He was unable to void in the office and returned later that day unable to void requiring replacement of a Foley catheter.  At his visit on 04/13/21, he continued with a foley catheter.  He continued to have issues with constipation although he had multiple bowel movements approximately 4 days prior.  No recent bowel movement.  He continues on MiraLAX and Senokot.  He also continued to take Flomax daily. He was continued with the Foley catheter.  He was changed to Rapaflo 8 mg daily in place of tamsulosin. Urine culture grew >100 K MRSA.  He was treated with Bactrim.  He was evaluated with cystoscopy on 04/29/2021.  Cystoscopy showed no significant enlargement of the prostate, a large capacity bladder with few trabeculations, and mucosal edema consistent with chronic Foley catheter irritation.  He was unable to void following the procedure. He  underwent evaluation with urodynamics on 05/25/2021.  The patient had a bladder capacity of 987 mL with decreased sensation.  There was instability noted with leakage.  The patient was able to generate a slight voluntary contraction and void a small amount with straining.  Max detrusor pressure was 20 cm of water with a max flow of 5 mL/s.  He underwent placement of a suprapubic catheter by interventional radiology on 06/23/2021. The patient pulled out his suprapubic catheter on 08/03/2021.  He underwent replacement of a suprapubic catheter by interventional radiology on 08/05/2021.  He again pulled out his catheter on 08/23/2021.  The catheter was replaced in the emergency department. Urine culture showed no growth. His SP tube was changed on 09/24/2021 with placement of a 16 French catheter.  He presents today for change of his suprapubic catheter.  His tube has been draining well.  No recent hematuria, fevers, or chills.  He continues to have some mucus drainage around the SP tube site.  11.12.2024: This is my first visit with Mr. Keilty.  Previously, he has been followed by Dr. Pete Glatter.  Please see above history (this has been reviewed by me).  He knows that there have been no problems with his catheter since his last suprapubic tube change here.  He does have some tenderness in his skin surrounding the tube site, however.  He was given a weeks worth of doxycycline.   Portions of the above documentation were copied from a prior visit for review purposes only.  Past Medical  History:  Past Medical History:  Diagnosis Date   Schizophrenia [facility])     Past Surgical History:  Past Surgical History:  Procedure Laterality Date   IR CATHETER TUBE CHANGE  08/05/2021   SHOULDER SURGERY      Allergies:  No Known Allergies  Family History:  No family history on file.  Social History:  Social History   Tobacco Use   Smoking status: Every Day    Current packs/day: 1.00    Types: Cigarettes   Vaping Use   Vaping status: Never Used  Substance Use Topics   Alcohol use: Yes    Comment: occasionaly   Drug use: No    ROS: Constitutional:  Negative for fever, chills, weight loss CV: Negative for chest pain, previous MI, hypertension Respiratory:  Negative for shortness of breath, wheezing, sleep apnea, frequent cough GI:  Negative for nausea, vomiting, bloody stool, GERD   Physical exam: There were no vitals taken for this visit. GENERAL APPEARANCE:  Well appearing, well developed, well nourished, NAD HEENT:  Atraumatic, normocephalic, oropharynx clear NECK:  Supple without lymphadenopathy or thyromegaly ABDOMEN:  Soft, non-tender, no masses; SP tube site intact.  There is significant granulation tissue surrounding the suprapubic tube site as well as cellulitic erythema approximately 3 cm radius around the suprapubic tube site. EXTREMITIES:  Moves all extremities well, without clubbing, cyanosis, or edema NEUROLOGIC:  Alert and oriented x 3, CN II-XII grossly intact MENTAL STATUS:  appropriate BACK:  Non-tender to palpation, No CVAT SKIN:  Warm, dry, and intact  Results: None  Cauterization with silver nitrate sticks was performed on the granulation tissue.  Assessment:  1.  Neurogenic bladder with indwelling suprapubic tube  2.  Granulation tissue of tube entry site, cauterized today  3.  Probable cellulitis  Plan:  1.  I put him on 1 weeks worth of doxycycline  2.  Nurse visit in 1 week to check wound  3.  Nurse visit/office visit with me in 1 month for suprapubic tube change/repeat cauterization

## 2023-02-01 ENCOUNTER — Ambulatory Visit: Payer: 59 | Admitting: Urology

## 2023-02-01 ENCOUNTER — Encounter: Payer: Self-pay | Admitting: Urology

## 2023-02-01 VITALS — BP 121/76 | HR 103

## 2023-02-01 DIAGNOSIS — Z435 Encounter for attention to cystostomy: Secondary | ICD-10-CM

## 2023-02-01 DIAGNOSIS — L03818 Cellulitis of other sites: Secondary | ICD-10-CM

## 2023-02-01 DIAGNOSIS — N319 Neuromuscular dysfunction of bladder, unspecified: Secondary | ICD-10-CM | POA: Diagnosis not present

## 2023-02-01 DIAGNOSIS — Z872 Personal history of diseases of the skin and subcutaneous tissue: Secondary | ICD-10-CM | POA: Diagnosis not present

## 2023-02-01 DIAGNOSIS — L929 Granulomatous disorder of the skin and subcutaneous tissue, unspecified: Secondary | ICD-10-CM

## 2023-02-01 DIAGNOSIS — R339 Retention of urine, unspecified: Secondary | ICD-10-CM

## 2023-03-04 ENCOUNTER — Ambulatory Visit (INDEPENDENT_AMBULATORY_CARE_PROVIDER_SITE_OTHER): Payer: 59

## 2023-03-04 DIAGNOSIS — R339 Retention of urine, unspecified: Secondary | ICD-10-CM | POA: Diagnosis not present

## 2023-03-04 DIAGNOSIS — Z435 Encounter for attention to cystostomy: Secondary | ICD-10-CM

## 2023-03-04 NOTE — Progress Notes (Signed)
Suprapubic Cath Change  Patient is present today for a suprapubic catheter change due to urinary retention.  10ml of water was drained from the balloon, a 16FR foley cath was removed from the tract with out difficulty.  Site was cleaned and prepped in a sterile fashion with betadine.  A 16FR foley cath was replaced into the tract no complications were noted. Urine return was noted, 10 ml of sterile water was inflated into the balloon and a bed bag was attached for drainage.  Patient tolerated well. A night bag was given to patient and proper instruction was given on how to switch bags.    Performed by: Guss Bunde, CMA  Follow up: as scheduled.

## 2023-04-01 ENCOUNTER — Ambulatory Visit (INDEPENDENT_AMBULATORY_CARE_PROVIDER_SITE_OTHER): Payer: 59

## 2023-04-01 DIAGNOSIS — R339 Retention of urine, unspecified: Secondary | ICD-10-CM | POA: Diagnosis not present

## 2023-04-01 DIAGNOSIS — Z435 Encounter for attention to cystostomy: Secondary | ICD-10-CM

## 2023-04-01 NOTE — Progress Notes (Signed)
 Suprapubic Cath Change  Patient is present today for a suprapubic catheter change due to urinary retention.  10ml of water was drained from the balloon, a 16FR foley cath was removed from the tract with out difficulty.  Site was cleaned and prepped in a sterile fashion with betadine .  A 16FR foley cath was replaced into the tract no complications were noted. Urine return was noted, 10 ml of sterile water was inflated into the balloon and a bed bag was attached for drainage.  Patient tolerated well.    Performed by: Exie DASEN. CMA  Follow up: As scheduled

## 2023-04-07 DIAGNOSIS — E1165 Type 2 diabetes mellitus with hyperglycemia: Secondary | ICD-10-CM | POA: Diagnosis not present

## 2023-04-07 DIAGNOSIS — Z299 Encounter for prophylactic measures, unspecified: Secondary | ICD-10-CM | POA: Diagnosis not present

## 2023-04-07 DIAGNOSIS — I10 Essential (primary) hypertension: Secondary | ICD-10-CM | POA: Diagnosis not present

## 2023-04-19 ENCOUNTER — Telehealth: Payer: Self-pay

## 2023-04-19 NOTE — Telephone Encounter (Signed)
 Nephew called to get cath anchors told Pt id leave it at the front desk

## 2023-05-03 ENCOUNTER — Ambulatory Visit (INDEPENDENT_AMBULATORY_CARE_PROVIDER_SITE_OTHER): Payer: 59

## 2023-05-03 DIAGNOSIS — R339 Retention of urine, unspecified: Secondary | ICD-10-CM | POA: Diagnosis not present

## 2023-05-03 NOTE — Progress Notes (Signed)
 Suprapubic Cath Change  Patient is present today for a suprapubic catheter change due to urinary retention.  10ml of water was drained from the balloon, a 16FR foley cath was removed from the tract with out difficulty.  Suprapubic catheter site was cleaned and prepped in a sterile fashion with Betadinex3  A 16FR foley cath was replaced into the tract no complications were noted. Urine return was noted, urine Clear yellow in color . 10 ml of sterile water was inflated into the balloon and a bedside bag was attached for drainage.  Patient tolerated well.   Performed by: Guilherme Schwenke LPN  Follow up: 4 weeks

## 2023-05-31 ENCOUNTER — Ambulatory Visit (INDEPENDENT_AMBULATORY_CARE_PROVIDER_SITE_OTHER)

## 2023-05-31 DIAGNOSIS — R339 Retention of urine, unspecified: Secondary | ICD-10-CM

## 2023-05-31 NOTE — Progress Notes (Signed)
 Suprapubic Cath Change  Patient is present today for a suprapubic catheter change due to urinary retention.  10ml of water was drained from the balloon, a 16FR foley cath was removed from the tract with out difficulty.  Suprapubic catheter site was cleaned and prepped in a sterile fashion with Betadinex3  A 16FR foley cath was replaced into the tract no complications were noted. Urine return was noted, urine Clear yellow in color . 10 ml of sterile water was inflated into the balloon and a bedside bag was attached for drainage.  Patient tolerated well. A night bag was given to patient and proper instruction was given on how to switch bags.    Performed by: Travers Goodley LPN  Follow up: 4 weeks

## 2023-05-31 NOTE — Patient Instructions (Signed)

## 2023-06-08 DIAGNOSIS — N390 Urinary tract infection, site not specified: Secondary | ICD-10-CM | POA: Diagnosis not present

## 2023-06-08 DIAGNOSIS — E1165 Type 2 diabetes mellitus with hyperglycemia: Secondary | ICD-10-CM | POA: Diagnosis not present

## 2023-06-23 ENCOUNTER — Other Ambulatory Visit: Payer: Self-pay

## 2023-06-23 ENCOUNTER — Encounter (HOSPITAL_COMMUNITY): Payer: Self-pay | Admitting: Emergency Medicine

## 2023-06-23 ENCOUNTER — Inpatient Hospital Stay (HOSPITAL_COMMUNITY)
Admission: EM | Admit: 2023-06-23 | Discharge: 2023-06-25 | DRG: 699 | Disposition: A | Discharge status: Home Health | Attending: Internal Medicine | Admitting: Internal Medicine

## 2023-06-23 ENCOUNTER — Emergency Department (HOSPITAL_COMMUNITY)

## 2023-06-23 DIAGNOSIS — N312 Flaccid neuropathic bladder, not elsewhere classified: Secondary | ICD-10-CM | POA: Diagnosis present

## 2023-06-23 DIAGNOSIS — Z8249 Family history of ischemic heart disease and other diseases of the circulatory system: Secondary | ICD-10-CM

## 2023-06-23 DIAGNOSIS — Z87891 Personal history of nicotine dependence: Secondary | ICD-10-CM

## 2023-06-23 DIAGNOSIS — Z791 Long term (current) use of non-steroidal anti-inflammatories (NSAID): Secondary | ICD-10-CM

## 2023-06-23 DIAGNOSIS — E139 Other specified diabetes mellitus without complications: Secondary | ICD-10-CM

## 2023-06-23 DIAGNOSIS — Y738 Miscellaneous gastroenterology and urology devices associated with adverse incidents, not elsewhere classified: Secondary | ICD-10-CM | POA: Diagnosis present

## 2023-06-23 DIAGNOSIS — D649 Anemia, unspecified: Secondary | ICD-10-CM | POA: Diagnosis present

## 2023-06-23 DIAGNOSIS — Z7984 Long term (current) use of oral hypoglycemic drugs: Secondary | ICD-10-CM

## 2023-06-23 DIAGNOSIS — N3001 Acute cystitis with hematuria: Secondary | ICD-10-CM | POA: Diagnosis not present

## 2023-06-23 DIAGNOSIS — Z79890 Hormone replacement therapy (postmenopausal): Secondary | ICD-10-CM

## 2023-06-23 DIAGNOSIS — Z79899 Other long term (current) drug therapy: Secondary | ICD-10-CM | POA: Diagnosis not present

## 2023-06-23 DIAGNOSIS — E119 Type 2 diabetes mellitus without complications: Secondary | ICD-10-CM | POA: Diagnosis not present

## 2023-06-23 DIAGNOSIS — E871 Hypo-osmolality and hyponatremia: Secondary | ICD-10-CM | POA: Diagnosis present

## 2023-06-23 DIAGNOSIS — Z9359 Other cystostomy status: Secondary | ICD-10-CM | POA: Diagnosis not present

## 2023-06-23 DIAGNOSIS — R339 Retention of urine, unspecified: Secondary | ICD-10-CM | POA: Diagnosis not present

## 2023-06-23 DIAGNOSIS — N3000 Acute cystitis without hematuria: Secondary | ICD-10-CM | POA: Diagnosis not present

## 2023-06-23 DIAGNOSIS — N179 Acute kidney failure, unspecified: Secondary | ICD-10-CM | POA: Diagnosis not present

## 2023-06-23 DIAGNOSIS — E861 Hypovolemia: Secondary | ICD-10-CM | POA: Diagnosis not present

## 2023-06-23 DIAGNOSIS — R109 Unspecified abdominal pain: Secondary | ICD-10-CM | POA: Diagnosis not present

## 2023-06-23 DIAGNOSIS — T83518A Infection and inflammatory reaction due to other urinary catheter, initial encounter: Principal | ICD-10-CM | POA: Diagnosis present

## 2023-06-23 DIAGNOSIS — F200 Paranoid schizophrenia: Secondary | ICD-10-CM | POA: Diagnosis present

## 2023-06-23 DIAGNOSIS — N390 Urinary tract infection, site not specified: Secondary | ICD-10-CM | POA: Diagnosis present

## 2023-06-23 DIAGNOSIS — R5381 Other malaise: Secondary | ICD-10-CM | POA: Diagnosis present

## 2023-06-23 DIAGNOSIS — K5900 Constipation, unspecified: Secondary | ICD-10-CM | POA: Diagnosis present

## 2023-06-23 DIAGNOSIS — F0390 Unspecified dementia without behavioral disturbance: Secondary | ICD-10-CM | POA: Diagnosis present

## 2023-06-23 DIAGNOSIS — K8689 Other specified diseases of pancreas: Secondary | ICD-10-CM | POA: Diagnosis not present

## 2023-06-23 LAB — URINALYSIS, ROUTINE W REFLEX MICROSCOPIC
Bilirubin Urine: NEGATIVE
Glucose, UA: NEGATIVE mg/dL
Ketones, ur: NEGATIVE mg/dL
Nitrite: NEGATIVE
Protein, ur: NEGATIVE mg/dL
Specific Gravity, Urine: 1.004 — ABNORMAL LOW (ref 1.005–1.030)
pH: 5 (ref 5.0–8.0)

## 2023-06-23 LAB — COMPREHENSIVE METABOLIC PANEL WITH GFR
ALT: 22 U/L (ref 0–44)
AST: 24 U/L (ref 15–41)
Albumin: 2 g/dL — ABNORMAL LOW (ref 3.5–5.0)
Alkaline Phosphatase: 92 U/L (ref 38–126)
Anion gap: 13 (ref 5–15)
BUN: 30 mg/dL — ABNORMAL HIGH (ref 8–23)
CO2: 22 mmol/L (ref 22–32)
Calcium: 8.6 mg/dL — ABNORMAL LOW (ref 8.9–10.3)
Chloride: 91 mmol/L — ABNORMAL LOW (ref 98–111)
Creatinine, Ser: 1.56 mg/dL — ABNORMAL HIGH (ref 0.61–1.24)
GFR, Estimated: 49 mL/min — ABNORMAL LOW (ref >60)
Glucose, Bld: 235 mg/dL — ABNORMAL HIGH (ref 70–99)
Potassium: 4.4 mmol/L (ref 3.5–5.1)
Sodium: 126 mmol/L — ABNORMAL LOW (ref 135–145)
Total Bilirubin: 1.2 mg/dL (ref 0.0–1.2)
Total Protein: 7.3 g/dL (ref 6.5–8.1)

## 2023-06-23 LAB — CBC WITH DIFFERENTIAL/PLATELET
Abs Immature Granulocytes: 0.05 10*3/uL (ref 0.00–0.07)
Basophils Absolute: 0 10*3/uL (ref 0.0–0.1)
Basophils Relative: 0 %
Eosinophils Absolute: 0 10*3/uL (ref 0.0–0.5)
Eosinophils Relative: 0 %
HCT: 26.2 % — ABNORMAL LOW (ref 39.0–52.0)
Hemoglobin: 8.5 g/dL — ABNORMAL LOW (ref 13.0–17.0)
Immature Granulocytes: 1 %
Lymphocytes Relative: 7 %
Lymphs Abs: 0.8 10*3/uL (ref 0.7–4.0)
MCH: 28.1 pg (ref 26.0–34.0)
MCHC: 32.4 g/dL (ref 30.0–36.0)
MCV: 86.5 fL (ref 80.0–100.0)
Monocytes Absolute: 0.5 10*3/uL (ref 0.1–1.0)
Monocytes Relative: 5 %
Neutro Abs: 8.8 10*3/uL — ABNORMAL HIGH (ref 1.7–7.7)
Neutrophils Relative %: 87 %
Platelets: 425 10*3/uL — ABNORMAL HIGH (ref 150–400)
RBC: 3.03 MIL/uL — ABNORMAL LOW (ref 4.22–5.81)
RDW: 14.9 % (ref 11.5–15.5)
WBC: 10.1 10*3/uL (ref 4.0–10.5)
nRBC: 0 % (ref 0.0–0.2)

## 2023-06-23 LAB — HEMOGLOBIN A1C
Hgb A1c MFr Bld: 7 % — ABNORMAL HIGH (ref 4.8–5.6)
Mean Plasma Glucose: 154.2 mg/dL

## 2023-06-23 LAB — LIPASE, BLOOD: Lipase: 21 U/L (ref 11–51)

## 2023-06-23 LAB — GLUCOSE, CAPILLARY: Glucose-Capillary: 344 mg/dL — ABNORMAL HIGH (ref 70–99)

## 2023-06-23 LAB — POC OCCULT BLOOD, ED: Fecal Occult Bld: NEGATIVE

## 2023-06-23 LAB — SODIUM, URINE, RANDOM: Sodium, Ur: <10 mmol/L

## 2023-06-23 MED ORDER — ACETAMINOPHEN 325 MG PO TABS: 650.0000 mg | ORAL_TABLET | Freq: Four times a day (QID) | ORAL | Status: DC | PRN

## 2023-06-23 MED ORDER — SODIUM CHLORIDE 0.9 % IV SOLN
1.0000 g | Freq: Once | INTRAVENOUS | Status: AC
  Administered 2023-06-23: 1 g via INTRAVENOUS
  Filled 2023-06-23: qty 10

## 2023-06-23 MED ORDER — SENNOSIDES-DOCUSATE SODIUM 8.6-50 MG PO TABS
1.0000 | ORAL_TABLET | Freq: Two times a day (BID) | ORAL | Status: DC
  Administered 2023-06-23 – 2023-06-24 (×3): 1 via ORAL
  Filled 2023-06-23 (×3): qty 1

## 2023-06-23 MED ORDER — FLEET ENEMA RE ENEM
1.0000 | ENEMA | Freq: Once | RECTAL | Status: AC
  Administered 2023-06-23: 1 via RECTAL

## 2023-06-23 MED ORDER — POLYETHYLENE GLYCOL 3350 17 G PO PACK
17.0000 g | PACK | Freq: Every day | ORAL | Status: DC | PRN
  Administered 2023-06-23: 17 g via ORAL

## 2023-06-23 MED ORDER — SODIUM CHLORIDE 0.9 % IV BOLUS
500.0000 mL | Freq: Once | INTRAVENOUS | Status: AC
  Administered 2023-06-23: 500 mL via INTRAVENOUS

## 2023-06-23 MED ORDER — INSULIN ASPART 100 UNIT/ML IJ SOLN
0.0000 [IU] | Freq: Every day | INTRAMUSCULAR | Status: DC
  Administered 2023-06-23: 4 [IU] via SUBCUTANEOUS

## 2023-06-23 MED ORDER — ACETAMINOPHEN 650 MG RE SUPP: 650.0000 mg | Freq: Four times a day (QID) | RECTAL | Status: DC | PRN

## 2023-06-23 MED ORDER — SODIUM CHLORIDE 0.9 % IV SOLN: INTRAVENOUS | Status: AC

## 2023-06-23 MED ORDER — INSULIN ASPART 100 UNIT/ML IJ SOLN
0.0000 [IU] | Freq: Three times a day (TID) | INTRAMUSCULAR | Status: DC
  Administered 2023-06-24: 2 [IU] via SUBCUTANEOUS
  Administered 2023-06-24: 3 [IU] via SUBCUTANEOUS
  Administered 2023-06-24: 2 [IU] via SUBCUTANEOUS
  Administered 2023-06-25: 1 [IU] via SUBCUTANEOUS

## 2023-06-23 MED ORDER — ENOXAPARIN SODIUM 40 MG/0.4ML IJ SOSY
40.0000 mg | PREFILLED_SYRINGE | INTRAMUSCULAR | Status: DC
  Administered 2023-06-23 – 2023-06-24 (×2): 40 mg via SUBCUTANEOUS
  Filled 2023-06-23 (×2): qty 0.4

## 2023-06-23 MED ORDER — SODIUM CHLORIDE 0.9 % IV SOLN
1.0000 g | INTRAVENOUS | Status: DC
  Administered 2023-06-24: 1 g via INTRAVENOUS
  Filled 2023-06-23: qty 10

## 2023-06-23 MED ORDER — POLYETHYLENE GLYCOL 3350 17 G PO PACK
17.0000 g | PACK | Freq: Two times a day (BID) | ORAL | Status: DC
  Administered 2023-06-24: 17 g via ORAL
  Filled 2023-06-23 (×2): qty 1

## 2023-06-23 NOTE — Assessment & Plan Note (Addendum)
 Stable.   - Awaiting med reconciliation, several antipsychotics on med list

## 2023-06-23 NOTE — ED Provider Notes (Signed)
 [place] EMERGENCY DEPARTMENT AT [facility] Provider Note   CSN: [id number] Arrival date & time: 06/23/23  1039     History  Chief Complaint  Patient presents with   Abdominal Pain    [name] is a 68 y.o. male.  He provides limited history due to his chronic schizophrenia and dementia.  Presents the ER today for pain "around the waist" described as burning x 5 days.  He does have a suprapubic catheter, states was changed several weeks ago.  States is having some burning around his penis as well.  Denies fever or chills.   Abdominal Pain      Home Medications Prior to Admission medications   Medication Sig Start Date End Date Taking? Authorizing Provider  cefUROXime (CEFTIN) 500 MG tablet Take 500 mg by mouth 2 (two) times daily. 06/08/23  Yes [provider]  benztropine  (COGENTIN ) 2 MG tablet Take 1 tablet (2 mg total) by mouth at bedtime. Patient taking differently: Take 2 mg by mouth 2 (two) times daily. 09/28/12   Ferrel Hsu, NP  donepezil (ARICEPT) 5 MG tablet Take 5 mg by mouth at bedtime. 04/17/21   [provider]  doxycycline  (VIBRA -TABS) 100 MG tablet Take 1 tablet (100 mg total) by mouth 2 (two) times daily. 01/04/23   Trent Frizzle, MD  Gauze Pads & Dressings 4"X4" PADS 1 Pad by Does not apply route daily as needed. 03/15/22   McKenzie, Arden Beck, MD  levothyroxine  (SYNTHROID , LEVOTHROID) 75 MCG tablet Take 1 tablet (75 mcg total) by mouth daily. 09/28/12   Ferrel Hsu, NP  meloxicam (MOBIC) 15 MG tablet Take 15 mg by mouth daily. 04/10/21   [provider]  metFORMIN (GLUCOPHAGE-XR) 500 MG 24 hr tablet Take 1 tablet by mouth 2 (two) times daily. 03/13/21   [provider]  nystatin  cream (MYCOSTATIN ) Apply 1 Application topically 2 (two) times daily as needed (for yeast). 10/06/22   Trent Frizzle, MD  omeprazole  (PRILOSEC) 20 MG capsule Take 1 capsule (20 mg total) by mouth daily. 09/28/12   Ferrel Hsu,  NP  paliperidone (INVEGA SUSTENNA) 234 MG/1.5ML SUSY injection Inject 1 mL into the muscle every 30 (thirty) days. 01/23/21   [provider]  paliperidone (INVEGA) 3 MG 24 hr tablet Take 1 tablet by mouth 2 (two) times daily. 03/13/21   [provider]  polyethylene glycol (MIRALAX  / GLYCOLAX ) 17 g packet Take 17 g by mouth 2 (two) times daily. Patient taking differently: Take 17 g by mouth 2 (two) times daily as needed for mild constipation. 03/20/21   Graciela Lava, MD  QUEtiapine (SEROQUEL) 200 MG tablet Take 600 mg by mouth at bedtime. 03/13/21   [provider]  rosuvastatin (CRESTOR) 5 MG tablet Take 5 mg by mouth once a week. Take 5 mg by mouth every week on Wednesday 03/13/21   [provider]  senna-docusate (SENOKOT-S) 8.6-50 MG tablet Take 1 tablet by mouth at bedtime as needed for mild constipation. 03/20/21   Graciela Lava, MD  silodosin  (RAPAFLO ) 8 MG CAPS capsule Take 8 mg by mouth daily. 07/30/21   [provider]  terbinafine (LAMISIL) 1 % cream Apply 1 application  topically daily as needed (rash). 04/16/21   [provider]      Allergies    Patient has no known allergies.    Review of Systems   Review of Systems  Gastrointestinal:  Positive for abdominal pain.  Physical Exam Updated Vital Signs BP 132/60   Pulse 99   Temp 97.8 F (36.6 C) (Oral)   Resp 20   Ht 5\' 10"  (1.778 m)   Wt 86.7 kg   SpO2 97%   BMI 27.43 kg/m  Physical Exam Vitals and nursing note reviewed.  Constitutional:      General: He is not in acute distress.    Appearance: He is well-developed.  HENT:     Head: Normocephalic and atraumatic.  Eyes:     Extraocular Movements: Extraocular movements intact.     Conjunctiva/sclera: Conjunctivae normal.     Pupils: Pupils are equal, round, and reactive to light.  Cardiovascular:     Rate and Rhythm: Normal rate and regular rhythm.     Heart sounds: No murmur heard. Pulmonary:     Effort:  Pulmonary effort is normal. No respiratory distress.     Breath sounds: Normal breath sounds.  Abdominal:     Palpations: Abdomen is soft.     Tenderness: There is abdominal tenderness in the right lower quadrant, suprapubic area and left lower quadrant.  Genitourinary:    Penis: Normal.      Testes: Normal.  Musculoskeletal:        General: No swelling.     Cervical back: Neck supple.  Skin:    General: Skin is warm and dry.     Capillary Refill: Capillary refill takes less than 2 seconds.  Neurological:     General: No focal deficit present.     Mental Status: He is alert.  Psychiatric:        Mood and Affect: Mood normal.     ED Results / Procedures / Treatments   Labs (all labs ordered are listed, but only abnormal results are displayed) Labs Reviewed  CBC WITH DIFFERENTIAL/PLATELET - Abnormal; Notable for the following components:      Result Value   RBC 3.03 (*)    Hemoglobin 8.5 (*)    HCT 26.2 (*)    Platelets 425 (*)    Neutro Abs 8.8 (*)    All other components within normal limits  COMPREHENSIVE METABOLIC PANEL WITH GFR - Abnormal; Notable for the following components:   Sodium 126 (*)    Chloride 91 (*)    Glucose, Bld 235 (*)    BUN 30 (*)    Creatinine, Ser 1.56 (*)    Calcium 8.6 (*)    Albumin 2.0 (*)    GFR, Estimated 49 (*)    All other components within normal limits  URINALYSIS, ROUTINE W REFLEX MICROSCOPIC - Abnormal; Notable for the following components:   Specific Gravity, Urine 1.004 (*)    Hgb urine dipstick MODERATE (*)    Leukocytes,Ua TRACE (*)    Bacteria, UA RARE (*)    All other components within normal limits  URINE CULTURE  LIPASE, BLOOD  SODIUM, URINE, RANDOM  OSMOLALITY, URINE  HEMOGLOBIN A1C  HIV ANTIBODY (ROUTINE TESTING W REFLEX)  BASIC METABOLIC PANEL WITH GFR  CBC  POC OCCULT BLOOD, ED    EKG None  Radiology CT ABDOMEN PELVIS WO CONTRAST Result Date: 06/23/2023 CLINICAL DATA:  Abdominal pain, acute, nonlocalized,  no bowel movement for 10 days EXAM: CT ABDOMEN AND PELVIS WITHOUT CONTRAST TECHNIQUE: Multidetector CT imaging of the abdomen and pelvis was performed following the standard protocol without IV contrast. RADIATION DOSE REDUCTION: This exam was performed according to the departmental dose-optimization program which includes automated exposure control, adjustment of the mA and/or  kV according to patient size and/or use of iterative reconstruction technique. COMPARISON:  March 18, 2021 FINDINGS: Of note, the lack of intravenous contrast limits evaluation of the solid organ parenchyma and vascularity. Lower chest: No focal airspace consolidation or pleural effusion. Subsegmental atelectasis in the right lower lobe. Dense multi-vessel coronary atherosclerosis. Hepatobiliary: No mass. No radiopaque stones or wall thickening of the gallbladder. No intrahepatic or extrahepatic biliary ductal dilation. Pancreas: Diffuse fatty atrophy of the pancreatic parenchyma. No mass or ductal dilation. No peripancreatic inflammation or fluid collection. Spleen: Normal size. No mass. Adrenals/Urinary Tract: No adrenal masses. No renal mass. No hydronephrosis or nephrolithiasis. Decompressed with wall thickening and trabeculation. Well-positioned suprapubic catheter in place. Small bladder calculus noted. Stomach/Bowel: The stomach is decompressed without focal abnormality. No small bowel wall thickening or inflammation. No small bowel obstruction. Normal appendix. Large volume fecal loading throughout the colon. Vascular/Lymphatic: No aortic aneurysm. Scattered aortoiliac atherosclerosis. No intraabdominal or pelvic lymphadenopathy. Reproductive: No prostatomegaly. No free pelvic fluid. Other: No pneumoperitoneum, ascites, or mesenteric inflammation. Musculoskeletal: No acute fracture or destructive lesion. Superior subluxation of the right femur with respect to the acetabulum. Extensive bony cortical destruction of the entire femoral  head with similar shallow, irregular acetabulum. Pronounced soft tissue surrounding the right femoroacetabular joint. Multilevel degenerative disc disease of the spine. Moderate joint space loss of the left hip. The femur is otherwise normal on the left. IMPRESSION: 1. Large volume fecal loading throughout the colon, consistent with constipation. 2. Well-positioned suprapubic catheter in place. Wall thickening and trabeculation of the urinary bladder, may be due to a neurogenic bladder or chronic inflammation. Correlation with urinalysis recommended to exclude acute cystitis. Electronically Signed   By: Rance Burrows M.D.   On: 06/23/2023 14:08    Procedures BLADDER CATHETERIZATION  Date/Time: 06/23/2023 12:56 PM  Performed by: Aimee Houseman, PA-C Authorized by: Aimee Houseman, PA-C   Consent:    Consent obtained:  Verbal   Consent given by:  Patient   Risks, benefits, and alternatives were discussed: yes     Risks discussed:  False passage, infection and pain Universal protocol:    Patient identity confirmed:  Verbally with patient Pre-procedure details:    Procedure purpose:  Diagnostic   Preparation: Patient was prepped and draped in usual sterile fashion   Anesthesia:    Anesthesia method:  None Procedure details:    Procedure performed by provider due to: Suprapubic foley.   Catheter insertion:  Indwelling   Catheter size:  16 Fr   Bladder irrigation: no     Number of attempts:  1   Urine characteristics:  Yellow and clear Post-procedure details:    Procedure completion:  Tolerated well, no immediate complications Comments:     Patient having abdominal pain and suprapubic burning, I replaced his suprapubic Foley catheter to obtain clean specimen and evaluate for possible infection     Medications Ordered in ED Medications  insulin  aspart (novoLOG ) injection 0-9 Units (has no administration in time range)  insulin  aspart (novoLOG ) injection 0-5 Units (has no  administration in time range)  enoxaparin  (LOVENOX ) injection 40 mg (40 mg Subcutaneous Given 06/23/23 1802)  0.9 %  sodium chloride  infusion ( Intravenous New Bag/Given 06/23/23 1801)  acetaminophen  (TYLENOL ) tablet 650 mg (has no administration in time range)    Or  acetaminophen  (TYLENOL ) suppository 650 mg (has no administration in time range)  polyethylene glycol (MIRALAX  / GLYCOLAX ) packet 17 g (17 g Oral Given 06/23/23 1801)  cefTRIAXone  (ROCEPHIN ) 1  g in sodium chloride  0.9 % 100 mL IVPB (has no administration in time range)  polyethylene glycol (MIRALAX  / GLYCOLAX ) packet 17 g (17 g Oral Not Given 06/23/23 1802)  senna-docusate (Senokot-S) tablet 1 tablet (has no administration in time range)  sodium phosphate (FLEET) enema 1 enema (1 enema Rectal Not Given 06/23/23 1802)  sodium chloride  0.9 % bolus 500 mL (500 mLs Intravenous Bolus 06/23/23 1218)  cefTRIAXone  (ROCEPHIN ) 1 g in sodium chloride  0.9 % 100 mL IVPB (1 g Intravenous New Bag/Given 06/23/23 1628)    ED Course/ Medical Decision Making/ A&P                                 Medical Decision Making This patient presents to the ED for concern of abdominal pain and constipation, this involves an extensive number of treatment options, and is a complaint that carries with it a high risk of complications and morbidity.  The differential diagnosis includes the patient, bowel obstruction, appendicitis, UTI, pancreatitis, cholecystitis, other   Co morbidities that complicate the patient evaluation :   Schizophrenia, suprapubic Foley catheter   Additional history obtained:  Additional history obtained from EMR External records from outside source obtained and reviewed including prior notes, labs   Lab Tests:  I Ordered, and personally interpreted labs.  The pertinent results include:   CBC-no leukocytosis, anemia with hemoglobin 8.5 worse from previous a year ago POCT Hemoccult-negative Lipase-normal UA 16 red blood cells, 16 white  blood cells, rare bacteria CMP-sodium 126, elevated BUN and creatinine from baseline CBC-no leukocytosis, hemoglobin 8.5 which is a significant drop from baseline around 11, though most recent baseline was 2 years ago CMP significant for sodium 126, glucose 235, BUN 30, creatinine 1.56 which is increased from baseline 2 years ago with creatinine of 0.83   Imaging Studies ordered:  I ordered imaging studies including CT abdomen pelvis which shows stool burden and bladder wall thickening with air in the bladder suspicious for cystitis I independently visualized and interpreted imaging within scope of identifying emergent findings  I agree with the radiologist interpretation     Consultations Obtained:  I requested consultation with the hospitalist,  and discussed lab and imaging findings as well as pertinent plan - they recommend: Patient   Problem List / ED Course / Critical interventions / Medication management  Abdominal pain-likely due to constipation also related to UTI.  Patient complained of a burning sensation in his abdomen, CT shows bladder wall thickening with some air in the bladder and this was obtained prior to changing his catheter.  I did change his suprapubic Foley to obtain a clean specimen and showed small leukocytes with 16 white blood cells 16 red blood cells and rare bacteria given his symptoms we will treat for possible UTI, culture ordered as well. Patient also noted to have AKI with mild hyponatremia, started gentle fluids, admitted to hospitalist after discussion. Anemia-noted no blood in the stool, rectal exam performed with PA student Deeann Fare at bedside, there is no stool in the glove but no gross blood, small amount of mucus on the glove was guaiac negative  I have reviewed the patients home medicines and have made adjustments as needed      Amount and/or Complexity of Data Reviewed Labs: ordered. Radiology: ordered.  Risk Decision regarding  hospitalization.           Final Clinical Impression(s) / ED Diagnoses Final diagnoses:  AKI (acute kidney injury) (HCC)  Acute cystitis without hematuria    Rx / DC Orders ED Discharge Orders     None         Aimee Houseman, PA-C 06/23/23 1814    Karlyn Overman, MD 06/24/23 [phone]

## 2023-06-23 NOTE — Care Management Obs Status (Signed)
 MEDICARE OBSERVATION STATUS NOTIFICATION   Patient Details  Name: [name] MRN: [id number] Date of Birth: December 19, 1955   Medicare Observation Status Notification Given:  Yes    Geraldina Klinefelter, RN 06/23/2023, 8:30 PM

## 2023-06-23 NOTE — H&P (Signed)
 History and Physical    [name] ZOX:[id number] DOB: 31-Oct-1955 DOA: 06/23/2023  PCP: [name], MD   Patient coming from: Home  I have personally briefly reviewed patient's old medical records in [facility]  Chief Complaint: Waist pain  HPI: [name] is a 68 y.o. male with medical history significant for dementia, schizophrenia, hypotonic bladder with chronic suprapubic catheter. Patient presented to the ED with complaints of burning in his penis, and pain around his waist of 5 days duration.  Reports poor oral intake over the past 5 days due to pain.  No vomiting no diarrhea.  Reports decreased urine output, and reports no bowel movements in the past 10 days. No difficulty breathing no chest pain no falls.  ED Course: Tmax 98.2.  Heart rate 100-103.  Respirate rate 17.  Blood pressure systolic 113-122.  O2 sats greater than 96% on room air. Sodium 126.  Creatinine elevated 1.56.  UA with trace leukocytes rare bacteria. Probably catheter was changed in the ED, before sample was collected. CT abdomen and pelvis without contrast-large volume fecal loading throughout the colon consistent with constipation, wall thickening and trabeculation of the urinary bladder-neurogenic bladder chronic inflammation. Urine cultures ordered.  1 g IV ceftriaxone  given 500 mL bolus given.  Review of Systems: As per HPI all other systems reviewed and negative.  Past Medical History:  Diagnosis Date   Schizophrenia [facility])     Past Surgical History:  Procedure Laterality Date   IR CATHETER TUBE CHANGE  08/05/2021   SHOULDER SURGERY       reports that he has quit smoking. His smoking use included cigarettes. He does not have any smokeless tobacco history on file. He reports current alcohol  use. He reports that he does not use drugs.  No Known Allergies  Family history of hypertension.  Prior to Admission medications   Medication Sig Start Date End Date Taking? Authorizing  Provider  benztropine  (COGENTIN ) 2 MG tablet Take 1 tablet (2 mg total) by mouth at bedtime. Patient taking differently: Take 2 mg by mouth 2 (two) times daily. 09/28/12   Ferrel Hsu, NP  donepezil (ARICEPT) 5 MG tablet Take 5 mg by mouth at bedtime. 04/17/21   [provider]  doxycycline  (VIBRA -TABS) 100 MG tablet Take 1 tablet (100 mg total) by mouth 2 (two) times daily. 01/04/23   Trent Frizzle, MD  Gauze Pads & Dressings 4"X4" PADS 1 Pad by Does not apply route daily as needed. 03/15/22   McKenzie, Arden Beck, MD  levothyroxine  (SYNTHROID , LEVOTHROID) 75 MCG tablet Take 1 tablet (75 mcg total) by mouth daily. 09/28/12   Ferrel Hsu, NP  meloxicam (MOBIC) 15 MG tablet Take 15 mg by mouth daily. 04/10/21   [provider]  metFORMIN (GLUCOPHAGE-XR) 500 MG 24 hr tablet Take 1 tablet by mouth 2 (two) times daily. 03/13/21   [provider]  nystatin  cream (MYCOSTATIN ) Apply 1 Application topically 2 (two) times daily as needed (for yeast). 10/06/22   Trent Frizzle, MD  omeprazole  (PRILOSEC) 20 MG capsule Take 1 capsule (20 mg total) by mouth daily. 09/28/12   Ferrel Hsu, NP  paliperidone (INVEGA SUSTENNA) 234 MG/1.5ML SUSY injection Inject 1 mL into the muscle every 30 (thirty) days. 01/23/21   [provider]  paliperidone (INVEGA) 3 MG 24 hr tablet Take 1 tablet by mouth 2 (two) times daily. 03/13/21   [provider]  polyethylene glycol (MIRALAX  / GLYCOLAX ) 17 g packet Take  17 g by mouth 2 (two) times daily. Patient taking differently: Take 17 g by mouth 2 (two) times daily as needed for mild constipation. 03/20/21   Graciela Lava, MD  QUEtiapine (SEROQUEL) 200 MG tablet Take 600 mg by mouth at bedtime. 03/13/21   [provider]  rosuvastatin (CRESTOR) 5 MG tablet Take 5 mg by mouth once a week. Take 5 mg by mouth every week on Wednesday 03/13/21   [provider]  senna-docusate (SENOKOT-S) 8.6-50 MG tablet Take 1 tablet by  mouth at bedtime as needed for mild constipation. 03/20/21   Graciela Lava, MD  silodosin  (RAPAFLO ) 8 MG CAPS capsule Take 8 mg by mouth daily. 07/30/21   [provider]  terbinafine (LAMISIL) 1 % cream Apply 1 application  topically daily as needed (rash). 04/16/21   [provider]    Physical Exam: Vitals:   06/23/23 1050  BP: 122/74  Pulse: (!) 103  Temp: 97.6 F (36.4 C)  TempSrc: Oral  SpO2: 96%    Constitutional: NAD, calm, comfortable Vitals:   06/23/23 1050  BP: 122/74  Pulse: (!) 103  Temp: 97.6 F (36.4 C)  TempSrc: Oral  SpO2: 96%   Eyes: PERRL, lids and conjunctivae normal ENMT: Mucous membranes are markedly dry.  Neck: normal, supple, no masses, no thyromegaly Respiratory: clear to auscultation bilaterally, no wheezing, no crackles. Normal respiratory effort. No accessory muscle use.  Cardiovascular: Regular rate and rhythm, no murmurs / rubs / gallops.  Trace to 1+ bilateral pitting lower extremity edema-unknown chronicity Abdomen: no tenderness, no masses palpated. No hepatosplenomegaly. Suprapubic catheter in place, very small amount of purulent drainage in stoma around catheter Musculoskeletal: no clubbing / cyanosis. No joint deformity upper and lower extremities. Skin: no rashes, lesions, ulcers. No induration Neurologic:  No facial symmetry, moving extremities spontaneously, speech fluent.  Psychiatric: Normal judgment and insight. Alert and oriented x 3. Normal mood.   Labs on Admission: I have personally reviewed following labs and imaging studies  CBC: Recent Labs  Lab 06/23/23 1110  WBC 10.1  NEUTROABS 8.8*  HGB 8.5*  HCT 26.2*  MCV 86.5  PLT 425*   Basic Metabolic Panel: Recent Labs  Lab 06/23/23 1110  NA 126*  K 4.4  CL 91*  CO2 22  GLUCOSE 235*  BUN 30*  CREATININE 1.56*  CALCIUM 8.6*   GFR: CrCl cannot be calculated (Unknown ideal weight.). Liver Function Tests: Recent Labs  Lab 06/23/23 1110  AST 24   ALT 22  ALKPHOS 92  BILITOT 1.2  PROT 7.3  ALBUMIN 2.0*   Recent Labs  Lab 06/23/23 1110  LIPASE 21   Urine analysis:    Component Value Date/Time   COLORURINE YELLOW 06/23/2023 1236   APPEARANCEUR CLEAR 06/23/2023 1236   APPEARANCEUR Cloudy (A) 04/13/2021 1059   LABSPEC 1.004 (L) 06/23/2023 1236   PHURINE 5.0 06/23/2023 1236   GLUCOSEU NEGATIVE 06/23/2023 1236   HGBUR MODERATE (A) 06/23/2023 1236   BILIRUBINUR NEGATIVE 06/23/2023 1236   BILIRUBINUR Negative 04/13/2021 1059   KETONESUR NEGATIVE 06/23/2023 1236   PROTEINUR NEGATIVE 06/23/2023 1236   UROBILINOGEN 1.0 09/21/2012 0530   NITRITE NEGATIVE 06/23/2023 1236   LEUKOCYTESUR TRACE (A) 06/23/2023 1236    Radiological Exams on Admission: CT ABDOMEN PELVIS WO CONTRAST Result Date: 06/23/2023 CLINICAL DATA:  Abdominal pain, acute, nonlocalized, no bowel movement for 10 days EXAM: CT ABDOMEN AND PELVIS WITHOUT CONTRAST TECHNIQUE: Multidetector CT imaging of the abdomen and pelvis was performed following the  standard protocol without IV contrast. RADIATION DOSE REDUCTION: This exam was performed according to the departmental dose-optimization program which includes automated exposure control, adjustment of the mA and/or kV according to patient size and/or use of iterative reconstruction technique. COMPARISON:  March 18, 2021 FINDINGS: Of note, the lack of intravenous contrast limits evaluation of the solid organ parenchyma and vascularity. Lower chest: No focal airspace consolidation or pleural effusion. Subsegmental atelectasis in the right lower lobe. Dense multi-vessel coronary atherosclerosis. Hepatobiliary: No mass. No radiopaque stones or wall thickening of the gallbladder. No intrahepatic or extrahepatic biliary ductal dilation. Pancreas: Diffuse fatty atrophy of the pancreatic parenchyma. No mass or ductal dilation. No peripancreatic inflammation or fluid collection. Spleen: Normal size. No mass. Adrenals/Urinary Tract: No  adrenal masses. No renal mass. No hydronephrosis or nephrolithiasis. Decompressed with wall thickening and trabeculation. Well-positioned suprapubic catheter in place. Small bladder calculus noted. Stomach/Bowel: The stomach is decompressed without focal abnormality. No small bowel wall thickening or inflammation. No small bowel obstruction. Normal appendix. Large volume fecal loading throughout the colon. Vascular/Lymphatic: No aortic aneurysm. Scattered aortoiliac atherosclerosis. No intraabdominal or pelvic lymphadenopathy. Reproductive: No prostatomegaly. No free pelvic fluid. Other: No pneumoperitoneum, ascites, or mesenteric inflammation. Musculoskeletal: No acute fracture or destructive lesion. Superior subluxation of the right femur with respect to the acetabulum. Extensive bony cortical destruction of the entire femoral head with similar shallow, irregular acetabulum. Pronounced soft tissue surrounding the right femoroacetabular joint. Multilevel degenerative disc disease of the spine. Moderate joint space loss of the left hip. The femur is otherwise normal on the left. IMPRESSION: 1. Large volume fecal loading throughout the colon, consistent with constipation. 2. Well-positioned suprapubic catheter in place. Wall thickening and trabeculation of the urinary bladder, may be due to a neurogenic bladder or chronic inflammation. Correlation with urinalysis recommended to exclude acute cystitis. Electronically Signed   By: Rance Burrows M.D.   On: 06/23/2023 14:08   EKG: None  Assessment/Plan Principal Problem:   AKI (acute kidney injury) (HCC) Active Problems:   Hyponatremia   UTI (urinary tract infection)   Paranoid schizophrenia, chronic condition (HCC)   Hypotonic bladder   Dementia (HCC)   Assessment and Plan: * AKI (acute kidney injury) (HCC) Creatinine 1.56, baseline 0.8-0.9.  Likely secondary to poor oral intake over the past 5 days. - 500 ml N/s bolus given, continue N/s 100cc/hr x  15hrs  UTI (urinary tract infection) Presenting with pain in his penis, and waist pain.  Suprapubic catheter changed in the ED, subsequent UA showed trace leukocytes and rare bacteria.  CT abdomen and pelvis-suggest neurogenic bladder or chronic inflammation.  Rules out for sepsis.  WBC 10.1.  No recent urine culture on file. -Follow-up urine cultures -IV ceftriaxone  1 g daily  Hyponatremia Sodium 126.  Baseline appears to be 130s. - Hydrate.  Dementia (HCC) Stable.   - Awaiting med reconciliation, several antipsychotics on med list  Hypotonic bladder Suprapubic catheter symptoms.  Constipation-reports no bowel movement in 10 days.  CT showing large volume fecal loading throughout the colon consistent with constipation. - Fleet enema, bowel regimen  DVT prophylaxis: Lovenox  Code Status: FULL code Family Communication: None at bedside Disposition Plan: ~ 2 days Consults called: None  Admission status:  Obs Med surg   Author: Pati Bonine, MD 06/23/2023 5:17 PM  For on call review [email or web address].

## 2023-06-23 NOTE — Assessment & Plan Note (Signed)
 Suprapubic catheter symptoms.

## 2023-06-23 NOTE — ED Notes (Signed)
 Family updated as to patient's status.

## 2023-06-23 NOTE — Progress Notes (Signed)
   06/23/23 2013  TOC Brief Assessment  Insurance and Status Reviewed  Patient has primary care physician Yes  Home environment has been reviewed From home  Prior level of function: Assisted  Prior/Current Home Services Current home services (aide)  Social Drivers of Health Review SDOH reviewed no interventions necessary  Readmission risk has been reviewed Yes  Transition of care needs no transition of care needs at this time   Pt from home. Lives c/nephew and his wife who is PCG/aide. Pt has an existing suprapubic catheter that is managed by [facility]; walker; shower chair. TOC to follow.

## 2023-06-23 NOTE — Assessment & Plan Note (Signed)
 Creatinine 1.56, baseline 0.8-0.9.  Likely secondary to poor oral intake over the past 5 days. - 500 ml N/s bolus given, continue N/s 100cc/hr x 15hrs

## 2023-06-23 NOTE — Assessment & Plan Note (Signed)
 Presenting with pain in his penis, and waist pain.  Suprapubic catheter changed in the ED, subsequent UA showed trace leukocytes and rare bacteria.  CT abdomen and pelvis-suggest neurogenic bladder or chronic inflammation.  Rules out for sepsis.  WBC 10.1.  No recent urine culture on file. -Follow-up urine cultures -IV ceftriaxone  1 g daily

## 2023-06-23 NOTE — ED Triage Notes (Signed)
 Pt bib rcems for abd pain. Pt has a exsiting foley cath in place and feels he has had less urine out put then normal. Family states he has no had a BM in 10days which he is normally constipated. Pt has hx of dementia . A/O to person only.  Pt denies pain at this time.

## 2023-06-23 NOTE — Assessment & Plan Note (Addendum)
 Sodium 126.  Baseline appears to be 130s. - Hydrate.

## 2023-06-24 DIAGNOSIS — E861 Hypovolemia: Secondary | ICD-10-CM | POA: Diagnosis present

## 2023-06-24 DIAGNOSIS — Z79890 Hormone replacement therapy (postmenopausal): Secondary | ICD-10-CM | POA: Diagnosis not present

## 2023-06-24 DIAGNOSIS — K5900 Constipation, unspecified: Secondary | ICD-10-CM | POA: Diagnosis present

## 2023-06-24 DIAGNOSIS — R5381 Other malaise: Secondary | ICD-10-CM | POA: Diagnosis present

## 2023-06-24 DIAGNOSIS — T83518A Infection and inflammatory reaction due to other urinary catheter, initial encounter: Secondary | ICD-10-CM | POA: Diagnosis present

## 2023-06-24 DIAGNOSIS — D649 Anemia, unspecified: Secondary | ICD-10-CM | POA: Diagnosis present

## 2023-06-24 DIAGNOSIS — Z9359 Other cystostomy status: Secondary | ICD-10-CM | POA: Diagnosis not present

## 2023-06-24 DIAGNOSIS — E871 Hypo-osmolality and hyponatremia: Secondary | ICD-10-CM | POA: Diagnosis present

## 2023-06-24 DIAGNOSIS — F200 Paranoid schizophrenia: Secondary | ICD-10-CM | POA: Diagnosis present

## 2023-06-24 DIAGNOSIS — F0390 Unspecified dementia without behavioral disturbance: Secondary | ICD-10-CM | POA: Diagnosis present

## 2023-06-24 DIAGNOSIS — Y738 Miscellaneous gastroenterology and urology devices associated with adverse incidents, not elsewhere classified: Secondary | ICD-10-CM | POA: Diagnosis present

## 2023-06-24 DIAGNOSIS — N3000 Acute cystitis without hematuria: Secondary | ICD-10-CM | POA: Diagnosis present

## 2023-06-24 DIAGNOSIS — Z8249 Family history of ischemic heart disease and other diseases of the circulatory system: Secondary | ICD-10-CM | POA: Diagnosis not present

## 2023-06-24 DIAGNOSIS — E119 Type 2 diabetes mellitus without complications: Secondary | ICD-10-CM | POA: Diagnosis present

## 2023-06-24 DIAGNOSIS — Z7984 Long term (current) use of oral hypoglycemic drugs: Secondary | ICD-10-CM | POA: Diagnosis not present

## 2023-06-24 DIAGNOSIS — N179 Acute kidney failure, unspecified: Secondary | ICD-10-CM | POA: Diagnosis present

## 2023-06-24 DIAGNOSIS — N312 Flaccid neuropathic bladder, not elsewhere classified: Secondary | ICD-10-CM | POA: Diagnosis present

## 2023-06-24 DIAGNOSIS — Z87891 Personal history of nicotine dependence: Secondary | ICD-10-CM | POA: Diagnosis not present

## 2023-06-24 DIAGNOSIS — Z791 Long term (current) use of non-steroidal anti-inflammatories (NSAID): Secondary | ICD-10-CM | POA: Diagnosis not present

## 2023-06-24 DIAGNOSIS — Z79899 Other long term (current) drug therapy: Secondary | ICD-10-CM | POA: Diagnosis not present

## 2023-06-24 LAB — BASIC METABOLIC PANEL WITH GFR
Anion gap: 10 (ref 5–15)
BUN: 22 mg/dL (ref 8–23)
CO2: 22 mmol/L (ref 22–32)
Calcium: 7.7 mg/dL — ABNORMAL LOW (ref 8.9–10.3)
Chloride: 103 mmol/L (ref 98–111)
Creatinine, Ser: 1.36 mg/dL — ABNORMAL HIGH (ref 0.61–1.24)
GFR, Estimated: 57 mL/min — ABNORMAL LOW (ref >60)
Glucose, Bld: 157 mg/dL — ABNORMAL HIGH (ref 70–99)
Potassium: 4.4 mmol/L (ref 3.5–5.1)
Sodium: 135 mmol/L (ref 135–145)

## 2023-06-24 LAB — CBC
HCT: 23.2 % — ABNORMAL LOW (ref 39.0–52.0)
Hemoglobin: 7.9 g/dL — ABNORMAL LOW (ref 13.0–17.0)
MCH: 29.3 pg (ref 26.0–34.0)
MCHC: 34.1 g/dL (ref 30.0–36.0)
MCV: 85.9 fL (ref 80.0–100.0)
Platelets: 440 10*3/uL — ABNORMAL HIGH (ref 150–400)
RBC: 2.7 MIL/uL — ABNORMAL LOW (ref 4.22–5.81)
RDW: 15.3 % (ref 11.5–15.5)
WBC: 8.9 10*3/uL (ref 4.0–10.5)
nRBC: 0 % (ref 0.0–0.2)

## 2023-06-24 LAB — GLUCOSE, CAPILLARY
Glucose-Capillary: 173 mg/dL — ABNORMAL HIGH (ref 70–99)
Glucose-Capillary: 193 mg/dL — ABNORMAL HIGH (ref 70–99)
Glucose-Capillary: 203 mg/dL — ABNORMAL HIGH (ref 70–99)
Glucose-Capillary: 115 mg/dL — ABNORMAL HIGH (ref 70–99)

## 2023-06-24 LAB — OSMOLALITY, URINE: Osmolality, Ur: 136 mosm/kg — ABNORMAL LOW (ref 300–900)

## 2023-06-24 LAB — HIV ANTIBODY (ROUTINE TESTING W REFLEX): HIV Screen 4th Generation wRfx: NONREACTIVE

## 2023-06-24 MED ORDER — SODIUM CHLORIDE 0.9 % IV SOLN: INTRAVENOUS | Status: DC

## 2023-06-24 NOTE — Plan of Care (Signed)
  Problem: Acute Rehab PT Goals(only PT should resolve) Goal: Pt Will Go Supine/Side To Sit Outcome: Progressing Flowsheets (Taken 06/24/2023 1540) Pt will go Supine/Side to Sit: with minimal assist Goal: Patient Will Transfer Sit To/From Stand Outcome: Progressing Flowsheets (Taken 06/24/2023 1540) Patient will transfer sit to/from stand: with minimal assist Goal: Pt Will Transfer Bed To Chair/Chair To Bed Outcome: Progressing Flowsheets (Taken 06/24/2023 1540) Pt will Transfer Bed to Chair/Chair to Bed: with min assist Goal: Pt Will Ambulate Outcome: Progressing Flowsheets (Taken 06/24/2023 1540) Pt will Ambulate:  25 feet  with minimal assist  with rolling walker   3:40 PM, 06/24/23 Walton Guppy, MPT Physical Therapist with [facility] [phone] office [phone] mobile phone

## 2023-06-24 NOTE — Plan of Care (Signed)
  Problem: Acute Rehab OT Goals (only OT should resolve) Goal: Pt. Will Perform Grooming Flowsheets (Taken 06/24/2023 1136) Pt Will Perform Grooming: with modified independence Goal: Pt. Will Perform Lower Body Bathing Flowsheets (Taken 06/24/2023 1136) Pt Will Perform Lower Body Bathing: with min assist Goal: Pt. Will Perform Upper Body Dressing Flowsheets (Taken 06/24/2023 1136) Pt Will Perform Upper Body Dressing: with modified independence Goal: Pt. Will Perform Lower Body Dressing Flowsheets (Taken 06/24/2023 1136) Pt Will Perform Lower Body Dressing: with min assist Goal: Pt. Will Transfer To Toilet Flowsheets (Taken 06/24/2023 1136) Pt Will Transfer to Toilet: with supervision Goal: Pt. Will Perform Toileting-Clothing Manipulation Flowsheets (Taken 06/24/2023 1136) Pt Will Perform Toileting - Clothing Manipulation and hygiene: with min assist Goal: Pt/Caregiver Will Perform Home Exercise Program Flowsheets (Taken 06/24/2023 1136) Pt/caregiver will Perform Home Exercise Program:  Increased ROM  Increased strength  Both right and left upper extremity  Independently  Joyce Leckey OT, MOT

## 2023-06-24 NOTE — Hospital Course (Addendum)
 67 yom w/ dementia, schizophrenia, hypotonic bladder with chronic suprapubic catheter presented to the ED with complaints of burning in his penis, and pain around his waist of 5 days and poor oral intake due to pain.Reports decreased urine output, and reports no bowel movements in the past 10 days. In ED: Afebrile hemodynamically stable Labs urine > hyponatremia sodium 126. Creatinine elevated 1.56.UA with trace leukocytes rare bacteria- probably catheter was changed in the ED, before sample was collected. CT abdomen and pelvis>>large volume fecal loading throughout the colon consistent with constipation, wall thickening and trabeculation of the urinary bladder-neurogenic bladder chronic inflammation.  Patient given IV ceftriaxone  IV fluids and culture obtained and admitted for further management AKI UTI hyponatremia  Subjective: Seen and examined this morning Overnight afebrile BP stable Reports no complaint had BM yesterday Labs reviewed Creat down 1.5> 1.3, hemoglobin 8.5> 7.9, fobt neg  Assessment and plan:  AKI: B.l creat ~0.8-0.9, on admit 1.5, likely secondary to poor oral intake/prerenal.  Creatinine downtrending with IV fluid hydration Cont the same while here.Encourage oral intake, avoid nephrotoxic medication, follow-up with PCP In 1 week   UTI POA Chronic SP catheter in place: Patient has been cleaning his penis and worst area in the setting of SP catheter- that was exchanged in the ED, continue empiric ceftriaxone  pending further culture report. CT abdomen and pelvis-suggest neurogenic bladder or chronic inflammation.  Significant constipation: Symptomatic.  CT findings reviewed.  He had BM yesterday continue aggressive bowel regimen  Hypovolemic hyponatremia: Resolved with IV fluids.     Dementia Schizophrenia Mood stable, resume home meds  pending med rec.   Hypotonic bladder Suprapubic catheter symptoms.   Chronic anemia: FOBT negative no signs of bleeding.   Monitor hemoglobin transfuse if less than 7 g  Deconditioning debility: PT OT requested

## 2023-06-24 NOTE — Evaluation (Signed)
 Occupational Therapy Evaluation Patient Details Name: [name] MRN: [id number] DOB: [birth year] Today's Date: 06/24/2023   History of Present Illness   [name] is a 68 y.o. male with medical history significant for dementia, schizophrenia, hypotonic bladder with chronic suprapubic catheter.  Patient presented to the ED with complaints of burning in his penis, and pain around his waist of 5 days duration.  Reports poor oral intake over the past 5 days due to pain.  No vomiting no diarrhea.  Reports decreased urine output, and reports no bowel movements in the past 10 days.  No difficulty breathing no chest pain no falls.     Clinical Impressions Pt agreeable to OT and PT co-evaluation. Pt has history of dementia but gave some history. No family present to confirm. Pt required max A for peri-care after a bowel movement today. Min to mod A for transfer. Max A for bed mobility. Max A for lower body ADL tasks. Unsure of level of assist at home, pt reported independence at times but later assist. Pt left in the chair with call bell within reach and chair alarm set. Pt will benefit from continued OT in the hospital and recommended venue below to increase strength, balance, and endurance for safe ADL's.        If plan is discharge home, recommend the following:   A lot of help with walking and/or transfers;A lot of help with bathing/dressing/bathroom;Direct supervision/assist for medications management;Assistance with cooking/housework;Assist for transportation;Help with stairs or ramp for entrance     Functional Status Assessment   Patient has had a recent decline in their functional status and demonstrates the ability to make significant improvements in function in a reasonable and predictable amount of time.     Equipment Recommendations   None recommended by OT             Precautions/Restrictions   Precautions Precautions: Fall Recall of  Precautions/Restrictions: Impaired Restrictions Weight Bearing Restrictions Per Provider Order: No     Mobility Bed Mobility Overal bed mobility: Needs Assistance Bed Mobility: Supine to Sit     Supine to sit: Max assist     General bed mobility comments: Much assist; labored movement    Transfers Overall transfer level: Needs assistance Equipment used: Rolling walker (2 wheels) Transfers: Sit to/from Stand, Bed to chair/wheelchair/BSC Sit to Stand: Min assist     Step pivot transfers: Min assist, Mod assist     General transfer comment: labored movement; much verbal and tactile cuing      Balance Overall balance assessment: Needs assistance Sitting-balance support: No upper extremity supported, Feet supported Sitting balance-Leahy Scale: Fair Sitting balance - Comments: seated at EOB   Standing balance support: Bilateral upper extremity supported, During functional activity, Reliant on assistive device for balance Standing balance-Leahy Scale: Poor Standing balance comment: poor to fair with RW                           ADL either performed or assessed with clinical judgement   ADL Overall ADL's : Needs assistance/impaired     Grooming: Set up;Minimal assistance;Sitting       Lower Body Bathing: Maximal assistance;Sitting/lateral leans   Upper Body Dressing : Set up;Minimal assistance;Sitting   Lower Body Dressing: Maximal assistance;Sitting/lateral leans   Toilet Transfer: Minimal assistance;Moderate assistance;Rolling walker (2 wheels);Stand-pivot;BSC/3in1 Statistician Details (indicate cue type and reason): EOB to [facility] Toileting- Clothing Manipulation and Hygiene: Maximal assistance;Sit to/from stand Toileting -  Clothing Manipulation Details (indicate cue type and reason): Max A for per care while standing with PT             Vision Baseline Vision/History: 0 No visual deficits Ability to See in Adequate Light: 0 Adequate Patient  Visual Report: No change from baseline Vision Assessment?: No apparent visual deficits     Perception Perception: Not tested       Praxis Praxis: Not tested       Pertinent Vitals/Pain Pain Assessment Pain Assessment: Faces Faces Pain Scale: Hurts a little bit Pain Location: B LE Pain Descriptors / Indicators: Discomfort Pain Intervention(s): Limited activity within patient's tolerance, Monitored during session, Repositioned     Extremity/Trunk Assessment Upper Extremity Assessment Upper Extremity Assessment: RUE deficits/detail;LUE deficits/detail RUE Deficits / Details: 3-/5 shoulder flexion; full P/ROM for shoulder flexion. Generlly weak otherwise. LUE Deficits / Details: 3-/5; limited shoulder flexion for P/ROM as well.   Lower Extremity Assessment Lower Extremity Assessment: Defer to PT evaluation   Cervical / Trunk Assessment Cervical / Trunk Assessment: Kyphotic   Communication Communication Communication: No apparent difficulties   Cognition Arousal: Alert Behavior During Therapy: WFL for tasks assessed/performed Cognition: History of cognitive impairments             OT - Cognition Comments: Oriented to self and name of town, but not oriented to year or hospital.                 Following commands: Intact       Cueing  General Comments   Cueing Techniques: Verbal cues;Tactile cues                 Home Living Family/patient expects to be discharged to:: Private residence Living Arrangements: Non-relatives/Friends Available Help at Discharge: Friend(s) Type of Home: Apartment Home Access: Stairs to enter [employer] of Steps: 12 Entrance Stairs-Rails: Left;Right;Can reach both Home Layout: One level     Bathroom Shower/Tub: Chief Strategy Officer: Handicapped height Bathroom Accessibility: Yes How Accessible: Accessible via wheelchair;Accessible via walker Home Equipment: Rolling Walker (2 wheels);Cane -  single point;Shower seat;Grab bars - tub/shower;Grab bars - toilet   Additional Comments: Pt reports living in the same place he had been from previous history.      Prior Functioning/Environment Prior Level of Function : Needs assist       Physical Assist : ADLs (physical)   ADLs (physical): Toileting;IADLs;Bathing Mobility Comments: Pt reports ambulation with SPC but later reported use of RW. ADLs Comments: Reports assist for bathing, toileting, and IADL's.    OT Problem List: Decreased strength;Decreased range of motion;Decreased activity tolerance;Impaired balance (sitting and/or standing)   OT Treatment/Interventions: Self-care/ADL training;Therapeutic exercise;Therapeutic activities;Patient/family education;Balance training      OT Goals(Current goals can be found in the care plan section)   Acute Rehab OT Goals Patient Stated Goal: improve function OT Goal Formulation: With patient Time For Goal Achievement: 07/08/23 Potential to Achieve Goals: Good   OT Frequency:  Min 2X/week    Co-evaluation PT/OT/SLP Co-Evaluation/Treatment: Yes Reason for Co-Treatment: To address functional/ADL transfers   OT goals addressed during session: ADL's and self-care      AM-PAC OT "6 Clicks" Daily Activity     Outcome Measure Help from another person eating meals?: None Help from another person taking care of personal grooming?: A Little Help from another person toileting, which includes using toliet, bedpan, or urinal?: A Lot Help from another person bathing (including washing, rinsing, drying)?: A Lot Help  from another person to put on and taking off regular upper body clothing?: A Little Help from another person to put on and taking off regular lower body clothing?: A Lot 6 Click Score: 16   End of Session Equipment Utilized During Treatment: Rolling walker (2 wheels)  Activity Tolerance: Patient tolerated treatment well Patient left: in chair;with call bell/phone within  reach;with chair alarm set  OT Visit Diagnosis: Unsteadiness on feet (R26.81);Other abnormalities of gait and mobility (R26.89);Muscle weakness (generalized) (M62.81)                Time: 1610-9604 OT Time Calculation (min): 34 min Charges:  OT General Charges $OT Visit: 1 Visit OT Evaluation $OT Eval Low Complexity: 1 Low  Drae Mitzel OT, MOT  Thurnell Floss 06/24/2023, 11:33 AM

## 2023-06-24 NOTE — TOC Initial Note (Addendum)
 Transition of Care [facility]) - Initial/Assessment Note    Patient Details  Name: [name] MRN: [id number] Date of Birth: 03/31/1955  [facility] CM/SW Contact:    Juanda Noon Phone Number: 06/24/2023, 2:52 PM  Clinical Narrative:                  PT recommended SNF. CSW called LG-Elsie ( sister) and spoke with her about recommendation for SNF . Sister asked why that was recommended, CSW explained and sister then shared that she already spoke with patient and he wants therapy in the home. CSW sent out referral for HHPT/HHOT to see who will accept [facility] Medicare insurance. Sister did shared that they help patient with most ADL's and putting on his shoes. TOC to follow.    Expected Discharge Plan: Home w Home Health Services Barriers to Discharge: Continued Medical Work up   Patient Goals and CMS Choice Patient states their goals for this hospitalization and ongoing recovery are:: DC home CMS Medicare.gov Compare Post Acute Care list provided to:: Patient Represenative (must comment) Meredith Stalls - Sister) Choice offered to / list presented to : Sibling      Expected Discharge Plan and Services In-house Referral: Clinical Social Work Discharge Planning Services: CM Consult Post Acute Care Choice: Durable Medical Equipment Living arrangements for the past 2 months: Single Family Home                           HH Arranged: PT, OT, Refused SNF  Pending HH agency: [facility] accepted referral PT/OT          Prior Living Arrangements/Services Living arrangements for the past 2 months: Single Family Home Lives with:: Siblings Patient language and need for interpreter reviewed:: Yes Do you feel safe going back to the place where you live?: Yes      Need for Family Participation in Patient Care: Yes (Comment) Care giver support system in place?: Yes (comment) Current home services: DME Criminal Activity/Legal Involvement Pertinent to Current  Situation/Hospitalization: No - Comment as needed  Activities of Daily Living   ADL Screening (condition at time of admission) Independently performs ADLs?: Yes (appropriate for developmental age) (Simultaneous filing. User may not have seen previous data.) Is the patient deaf or have difficulty hearing?: No (Simultaneous filing. User may not have seen previous data.) Does the patient have difficulty seeing, even when wearing glasses/contacts?: No (Simultaneous filing. User may not have seen previous data.) Does the patient have difficulty concentrating, remembering, or making decisions?: No  Permission Sought/Granted      Share Information with NAME: Meredith Stalls     Permission granted to share info w Relationship: Sister     Emotional Assessment Appearance:: Appears stated age     Orientation: : Oriented to Self Alcohol  / Substance Use: Not Applicable Psych Involvement: No (comment)  Admission diagnosis:  AKI (acute kidney injury) (HCC) [N17.9] Patient Active Problem List   Diagnosis Date Noted   UTI (urinary tract infection) 06/23/2023   DM (diabetes mellitus) (HCC) 06/23/2023   Dementia (HCC) 11/05/2021   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 - [place] - [address] [place] [address] Phone: [phone] Fax: [phone]     Social Drivers of Health (SDOH) Social History: SDOH Screenings   Food Insecurity: No Food Insecurity (06/23/2023)  Housing: Low Risk  (06/23/2023)  Transportation Needs: No Transportation Needs (06/23/2023)  Utilities: Not At Risk (06/23/2023)  Financial Resource Strain: Medium Risk (05/19/2022)   Received from [facility], [facility]  Social Connections: Unknown (06/23/2023)  Tobacco Use: Medium Risk (06/23/2023)  Health Literacy: High Risk (02/24/2021)   Received from [facility], [facility]   SDOH Interventions:     Readmission Risk Interventions     No data to display

## 2023-06-24 NOTE — Evaluation (Signed)
 Physical Therapy Evaluation Patient Details Name: [name] MRN: [id number] DOB: 1955-04-21 Today's Date: 06/24/2023  History of Present Illness  [name] is a 68 y.o. male with medical history significant for dementia, schizophrenia, hypotonic bladder with chronic suprapubic catheter.  Patient presented to the ED with complaints of burning in his penis, and pain around his waist of 5 days duration.  Reports poor oral intake over the past 5 days due to pain.  No vomiting no diarrhea.  Reports decreased urine output, and reports no bowel movements in the past 10 days.  No difficulty breathing no chest pain no falls.   Clinical Impression  Patient demonstrates slow labored movement for sitting up at bedside, once standing incontinent of stool, able to transfer to [facility] with slow labored movement and limited to taking a few steps at bedside before requesting to sit due to c/o fatigue. Patient tolerated sitting up in chair after therapy. Patient will benefit from continued skilled physical therapy in hospital and recommended venue below to increase strength, balance, endurance for safe ADLs and gait.           If plan is discharge home, recommend the following: A lot of help with bathing/dressing/bathroom;A lot of help with walking and/or transfers;Help with stairs or ramp for entrance;Assistance with cooking/housework   Can travel by private vehicle   No    Equipment Recommendations None recommended by PT  Recommendations for Other Services       Functional Status Assessment Patient has had a recent decline in their functional status and demonstrates the ability to make significant improvements in function in a reasonable and predictable amount of time.     Precautions / Restrictions Precautions Precautions: Fall Restrictions Weight Bearing Restrictions Per Provider Order: No      Mobility  Bed Mobility Overal bed mobility: Needs Assistance Bed Mobility: Supine to Sit      Supine to sit: Max assist     General bed mobility comments: increased time, labored movement    Transfers Overall transfer level: Needs assistance Equipment used: Rolling walker (2 wheels) Transfers: Sit to/from Stand, Bed to chair/wheelchair/BSC Sit to Stand: Min assist   Step pivot transfers: Min assist, Mod assist       General transfer comment: unsteady labored movement    Ambulation/Gait Ambulation/Gait assistance: Mod assist Gait Distance (Feet): 10 Feet Assistive device: Rolling walker (2 wheels) Gait Pattern/deviations: Decreased step length - right, Decreased step length - left, Decreased stride length Gait velocity: slow     General Gait Details: limited to taking steps at bedside due to c/o fatigue and generalized weakness  Stairs            Wheelchair Mobility     Tilt Bed    Modified Rankin (Stroke Patients Only)       Balance Overall balance assessment: Needs assistance Sitting-balance support: Feet supported, No upper extremity supported Sitting balance-Leahy Scale: Fair Sitting balance - Comments: seated at EOB   Standing balance support: Bilateral upper extremity supported, During functional activity, Reliant on assistive device for balance Standing balance-Leahy Scale: Poor Standing balance comment: poor to fair with RW                             Pertinent Vitals/Pain Pain Assessment Pain Assessment: Faces Faces Pain Scale: Hurts a little bit Pain Location: B LE Pain Descriptors / Indicators: Discomfort Pain Intervention(s): Limited activity within patient's tolerance, Monitored during session,  Repositioned    Home Living Family/patient expects to be discharged to:: Private residence Living Arrangements: Non-relatives/Friends Available Help at Discharge: Friend(s) Type of Home: Apartment Home Access: Stairs to enter Entrance Stairs-Rails: Left;Right;Can reach both Entrance Stairs-Number of Steps: 12   Home  Layout: One level Home Equipment: Agricultural consultant (2 wheels);Cane - single point;Shower seat;Grab bars - tub/shower;Grab bars - toilet Additional Comments: Pt reports living in the same place he had been from previous history.    Prior Function Prior Level of Function : Needs assist       Physical Assist : ADLs (physical);Mobility (physical) Mobility (physical): Bed mobility;Transfers;Gait;Stairs   Mobility Comments: Household ambulation using RW ADLs Comments: Reports assist for bathing, toileting, and IADL's.     Extremity/Trunk Assessment   Upper Extremity Assessment Upper Extremity Assessment: Defer to OT evaluation    Lower Extremity Assessment Lower Extremity Assessment: Generalized weakness    Cervical / Trunk Assessment Cervical / Trunk Assessment: Kyphotic  Communication   Communication Communication: No apparent difficulties    Cognition Arousal: Alert Behavior During Therapy: WFL for tasks assessed/performed   PT - Cognitive impairments: History of cognitive impairments                         Following commands: Intact       Cueing Cueing Techniques: Verbal cues, Tactile cues     General Comments      Exercises     Assessment/Plan    PT Assessment Patient needs continued PT services  PT Problem List Decreased strength;Decreased activity tolerance;Decreased balance;Decreased mobility       PT Treatment Interventions DME instruction;Gait training;Stair training;Functional mobility training;Therapeutic activities;Therapeutic exercise;Balance training;Patient/family education    PT Goals (Current goals can be found in the Care Plan section)  Acute Rehab PT Goals Patient Stated Goal: return home after rehab PT Goal Formulation: With patient Time For Goal Achievement: 07/08/23 Potential to Achieve Goals: Good    Frequency Min 3X/week     Co-evaluation PT/OT/SLP Co-Evaluation/Treatment: Yes Reason for Co-Treatment: To address  functional/ADL transfers PT goals addressed during session: Mobility/safety with mobility;Balance;Proper use of DME         AM-PAC PT "6 Clicks" Mobility  Outcome Measure Help needed turning from your back to your side while in a flat bed without using bedrails?: A Little Help needed moving from lying on your back to sitting on the side of a flat bed without using bedrails?: A Lot Help needed moving to and from a bed to a chair (including a wheelchair)?: A Lot Help needed standing up from a chair using your arms (e.g., wheelchair or bedside chair)?: A Lot Help needed to walk in hospital room?: A Lot Help needed climbing 3-5 steps with a railing? : Total 6 Click Score: 12    End of Session   Activity Tolerance: Patient tolerated treatment well;Patient limited by fatigue Patient left: in chair;with call bell/phone within reach;with chair alarm set Nurse Communication: Mobility status PT Visit Diagnosis: Unsteadiness on feet (R26.81);Other abnormalities of gait and mobility (R26.89);Muscle weakness (generalized) (M62.81)    Time: 4098-1191 PT Time Calculation (min) (ACUTE ONLY): 35 min   Charges:   PT Evaluation $PT Eval Moderate Complexity: 1 Mod PT Treatments $Therapeutic Exercise: 23-37 mins PT General Charges $$ ACUTE PT VISIT: 1 Visit         3:39 PM, 06/24/23 Walton Guppy, MPT Physical Therapist with [facility] [phone] office [phone] mobile phone

## 2023-06-24 NOTE — Plan of Care (Signed)

## 2023-06-24 NOTE — Progress Notes (Signed)
 PROGRESS NOTE [name]  ZHY:[id number] DOB: 07/14/1955 DOA: 06/23/2023 PCP: [name], MD  Brief Narrative/Hospital Course: 22 yom w/ dementia, schizophrenia, hypotonic bladder with chronic suprapubic catheter presented to the ED with complaints of burning in his penis, and pain around his waist of 5 days and poor oral intake due to pain.Reports decreased urine output, and reports no bowel movements in the past 10 days. In ED: Afebrile hemodynamically stable Labs urine > hyponatremia sodium 126. Creatinine elevated 1.56.UA with trace leukocytes rare bacteria- probably catheter was changed in the ED, before sample was collected. CT abdomen and pelvis>>large volume fecal loading throughout the colon consistent with constipation, wall thickening and trabeculation of the urinary bladder-neurogenic bladder chronic inflammation.  Patient given IV ceftriaxone  IV fluids and culture obtained and admitted for further management AKI UTI hyponatremia  Subjective: Seen and examined this morning Overnight afebrile BP stable Reports no complaint had BM yesterday Labs reviewed Creat down 1.5> 1.3, hemoglobin 8.5> 7.9, fobt neg  Assessment and plan:  AKI: B.l creat ~0.8-0.9, on admit 1.5, likely secondary to poor oral intake/prerenal.  Creatinine downtrending with IV fluid hydration Cont the same while here.Encourage oral intake, avoid nephrotoxic medication, follow-up with PCP In 1 week   UTI POA Chronic SP catheter in place: Patient has been cleaning his penis and worst area in the setting of SP catheter- that was exchanged in the ED, continue empiric ceftriaxone  pending further culture report. CT abdomen and pelvis-suggest neurogenic bladder or chronic inflammation.  Significant constipation: Symptomatic.  CT findings reviewed.  He had BM yesterday continue aggressive bowel regimen  Hypovolemic hyponatremia: Resolved with IV fluids.     Dementia Schizophrenia Mood stable, resume home  meds  pending med rec.   Hypotonic bladder Suprapubic catheter symptoms.   Chronic anemia: FOBT negative no signs of bleeding.  Monitor hemoglobin transfuse if less than 7 g  Deconditioning debility: PT OT requested    DVT prophylaxis: enoxaparin  (LOVENOX ) injection 40 mg Start: 06/23/23 1830 Code Status:   Code Status: Full Code Family Communication: plan of care discussed with patient at bedside. Patient status is: Remains hospitalized because of severity of illness Level of care: Med-Surg   Dispo: The patient is from: home            Anticipated disposition: TBD.  PT OT requested Objective: Vitals last 24 hrs: Vitals:   06/23/23 1724 06/23/23 2113 06/23/23 2321 06/24/23 0427  BP: 132/60 (!) 126/50 (!) 123/52 (!) 128/56  Pulse: 99 94  (!) 108  Resp: 20 20 17 20   Temp: 97.8 F (36.6 C) 98.4 F (36.9 C) 98.3 F (36.8 C) 98.2 F (36.8 C)  TempSrc: Oral Oral  Oral  SpO2: 97% 97% 99% 94%  Weight: 86.7 kg     Height: 5\' 10"  (1.778 m)       Physical Examination: General exam: alert awake, older than stated age HEENT:Oral mucosa moist, Ear/Nose WNL grossly Respiratory system: B.L clear BS, no use of accessory muscle Cardiovascular system: S1 & S2 +. Gastrointestinal system: Abdomen soft, NT,ND,BS+ Nervous System: Alert, awake,following commands. Extremities: LE edema neg, warm extremities Skin: No rashes,warm. MSK: Normal muscle bulk/tone.   Data Reviewed: I have personally reviewed following labs and imaging studies ( see epic result tab) CBC: Recent Labs  Lab 06/23/23 1110 06/24/23 0433  WBC 10.1 8.9  NEUTROABS 8.8*  --   HGB 8.5* 7.9*  HCT 26.2* 23.2*  MCV 86.5 85.9  PLT 425* 440*   CMP: Recent Labs  Lab 06/23/23 1110 06/24/23 0433  NA 126* 135  K 4.4 4.4  CL 91* 103  CO2 22 22  GLUCOSE 235* 157*  BUN 30* 22  CREATININE 1.56* 1.36*  CALCIUM 8.6* 7.7*   GFR: Estimated Creatinine Clearance: 55.2 mL/min (A) (by C-G formula based on SCr of 1.36 mg/dL  (H)). Recent Labs  Lab 06/23/23 1110  AST 24  ALT 22  ALKPHOS 92  BILITOT 1.2  PROT 7.3  ALBUMIN 2.0*    Recent Labs  Lab 06/23/23 1110  LIPASE 21   No results for input(s): "AMMONIA" in the last 168 hours. Coagulation Profile: No results for input(s): "INR", "PROTIME" in the last 168 hours. Unresulted Labs (From admission, onward)     Start     Ordered   06/24/23 0500  HIV Antibody (routine testing w rflx)  (HIV Antibody (Routine testing w reflex) panel)  Tomorrow morning,   R        06/23/23 1733   06/23/23 1312  Urine Culture  Once,   URGENT       Question:  Indication  Answer:  Suprapubic pain   06/23/23 1312           Antimicrobials/Microbiology: Anti-infectives (From admission, onward)    Start     Dose/Rate Route Frequency Ordered Stop   06/24/23 1600  cefTRIAXone  (ROCEPHIN ) 1 g in sodium chloride  0.9 % 100 mL IVPB        1 g 200 mL/hr over 30 Minutes Intravenous Every 24 hours 06/23/23 1716     06/23/23 1600  cefTRIAXone  (ROCEPHIN ) 1 g in sodium chloride  0.9 % 100 mL IVPB        1 g 200 mL/hr over 30 Minutes Intravenous  Once 06/23/23 1545 06/23/23 1901         Component Value Date/Time   SDES  08/23/2021 1509    URINE, CLEAN CATCH Performed at [facility], [address], [place], [place] [address]    [facility]  08/23/2021 1509    NONE Performed at [facility], [address], [place], [place] [address]    CULT  08/23/2021 1509    NO GROWTH Performed at [facility], [address], [place], [place] [address]    REPTSTATUS 08/25/2021 FINAL 08/23/2021 1509  Medications reviewed:  Scheduled Meds:  enoxaparin  (LOVENOX ) injection  40 mg Subcutaneous Q24H   insulin  aspart  0-5 Units Subcutaneous QHS   insulin  aspart  0-9 Units Subcutaneous TID WC   polyethylene glycol  17 g Oral BID   senna-docusate  1 tablet Oral BID   Continuous Infusions:  cefTRIAXone  (ROCEPHIN )  IV      Lesa Rape, MD [facility] 06/24/2023, 11:40 AM

## 2023-06-25 DIAGNOSIS — N179 Acute kidney failure, unspecified: Secondary | ICD-10-CM | POA: Diagnosis not present

## 2023-06-25 LAB — CBC
HCT: 24.2 % — ABNORMAL LOW (ref 39.0–52.0)
Hemoglobin: 8.1 g/dL — ABNORMAL LOW (ref 13.0–17.0)
MCH: 29 pg (ref 26.0–34.0)
MCHC: 33.5 g/dL (ref 30.0–36.0)
MCV: 86.7 fL (ref 80.0–100.0)
Platelets: 468 10*3/uL — ABNORMAL HIGH (ref 150–400)
RBC: 2.79 MIL/uL — ABNORMAL LOW (ref 4.22–5.81)
RDW: 15.1 % (ref 11.5–15.5)
WBC: 11.4 10*3/uL — ABNORMAL HIGH (ref 4.0–10.5)
nRBC: 0 % (ref 0.0–0.2)

## 2023-06-25 LAB — GLUCOSE, CAPILLARY: Glucose-Capillary: 136 mg/dL — ABNORMAL HIGH (ref 70–99)

## 2023-06-25 LAB — BASIC METABOLIC PANEL WITH GFR
Anion gap: 9 (ref 5–15)
BUN: 17 mg/dL (ref 8–23)
CO2: 20 mmol/L — ABNORMAL LOW (ref 22–32)
Calcium: 8.1 mg/dL — ABNORMAL LOW (ref 8.9–10.3)
Chloride: 105 mmol/L (ref 98–111)
Creatinine, Ser: 1.16 mg/dL (ref 0.61–1.24)
GFR, Estimated: >60 mL/min (ref >60)
Glucose, Bld: 139 mg/dL — ABNORMAL HIGH (ref 70–99)
Potassium: 4.4 mmol/L (ref 3.5–5.1)
Sodium: 134 mmol/L — ABNORMAL LOW (ref 135–145)

## 2023-06-25 LAB — MAGNESIUM: Magnesium: 1.8 mg/dL (ref 1.7–2.4)

## 2023-06-25 MED ORDER — SODIUM CHLORIDE 0.9 % IV SOLN
1.0000 g | INTRAVENOUS | Status: DC
  Administered 2023-06-25: 1 g via INTRAVENOUS
  Filled 2023-06-25: qty 10

## 2023-06-25 NOTE — Plan of Care (Signed)
  Problem: Health Behavior/Discharge Planning: Goal: Ability to manage health-related needs will improve Outcome: Progressing   Problem: Clinical Measurements: Goal: Ability to maintain clinical measurements within normal limits will improve Outcome: Progressing Goal: Will remain free from infection Outcome: Progressing Goal: Diagnostic test results will improve Outcome: Progressing   Problem: Activity: Goal: Risk for activity intolerance will decrease Outcome: Progressing   Problem: Elimination: Goal: Will not experience complications related to bowel motility Outcome: Progressing Goal: Will not experience complications related to urinary retention Outcome: Progressing   Problem: Safety: Goal: Ability to remain free from injury will improve Outcome: Progressing   Problem: Skin Integrity: Goal: Risk for impaired skin integrity will decrease Outcome: Progressing   Problem: Health Behavior/Discharge Planning: Goal: Ability to manage health-related needs will improve Outcome: Progressing   Problem: Metabolic: Goal: Ability to maintain appropriate glucose levels will improve Outcome: Progressing   Problem: Tissue Perfusion: Goal: Adequacy of tissue perfusion will improve Outcome: Progressing

## 2023-06-25 NOTE — Discharge Summary (Signed)
 Physician Discharge Summary  [name] ZOX:[id number] DOB: 1955/08/07 DOA: 06/23/2023  PCP: [name], MD  Admit date: 06/23/2023  Discharge date: 06/25/2023  Admitted From:Home  Disposition:  Home, refused SNF  Recommendations for Outpatient Follow-up:  Follow up with PCP in 1-2 weeks Completed course of antibiotics for UTI and suprapubic catheter has been exchanged Follow-up BMP in 1 week to ensure creatinine levels remained stable Continue on home medications as prior  Home Health: Yes with PT/OT, refused SNF  Equipment/Devices: Suprapubic catheter  Discharge Condition:Stable  CODE STATUS: Full  Diet recommendation: Heart Healthy  Brief/Interim Summary: 26 yom w/ dementia, schizophrenia, hypotonic bladder with chronic suprapubic catheter presented to the ED with complaints of burning in his penis, and pain around his waist of 5 days and poor oral intake due to pain.Reports decreased urine output, and reports no bowel movements in the past 10 days.  Patient was admitted with prerenal AKI which has resolved with IV fluid hydration and was also noted to have UTI in the setting of suprapubic catheter.  His catheter was exchanged and he was on empiric Rocephin .  He is now tolerating diet quite well and has no further pain complaints.  He has been seen by PT with recommendations for SNF on discharge, however he and legal guardian refused at this point and would like to go home with home health services.  This has been arranged and he is now eligible for discharge.  No other acute events or concerns noted.  Discharge Diagnoses:  Principal Problem:   AKI (acute kidney injury) (HCC) Active Problems:   Hyponatremia   UTI (urinary tract infection)   Paranoid schizophrenia, chronic condition (HCC)   Hypotonic bladder   Dementia (HCC)   DM (diabetes mellitus) (HCC)  Principal discharge diagnosis: Prerenal AKI along with UTI in the setting of chronic suprapubic  catheter.  Discharge Instructions  Discharge Instructions     Diet - low sodium heart healthy   Complete by: As directed    Increase activity slowly   Complete by: As directed    No wound care   Complete by: As directed       Allergies as of 06/25/2023   No Known Allergies      Medication List     STOP taking these medications    cefUROXime 500 MG tablet Commonly known as: CEFTIN       TAKE these medications    benztropine  2 MG tablet Commonly known as: COGENTIN  Take 1 tablet (2 mg total) by mouth at bedtime. What changed: when to take this   donepezil 5 MG tablet Commonly known as: ARICEPT Take 5 mg by mouth daily.   Invega Sustenna 234 MG/1.5ML injection Generic drug: paliperidone Inject 1 mL into the muscle every 30 (thirty) days.   levothyroxine  75 MCG tablet Commonly known as: SYNTHROID  Take 1 tablet (75 mcg total) by mouth daily.   meloxicam 15 MG tablet Commonly known as: MOBIC Take 15 mg by mouth daily.   metFORMIN 500 MG 24 hr tablet Commonly known as: GLUCOPHAGE-XR Take 500 mg by mouth daily.   omeprazole  20 MG capsule Commonly known as: PRILOSEC Take 1 capsule (20 mg total) by mouth daily.   paliperidone 3 MG 24 hr tablet Commonly known as: INVEGA Take 1 tablet by mouth 2 (two) times daily.   polyethylene glycol 17 g packet Commonly known as: MIRALAX  / GLYCOLAX  Take 17 g by mouth 2 (two) times daily. What changed:  when to take  this reasons to take this   QUEtiapine 200 MG tablet Commonly known as: SEROQUEL Take 600 mg by mouth at bedtime.   rosuvastatin 5 MG tablet Commonly known as: CRESTOR Take 5 mg by mouth once a week. Take 5 mg by mouth every week on Friday        Follow-up Information     [facility] Follow up.   Why: PT/OT will call to scheduled first home visit on Monday.        [name], MD. Schedule an appointment as soon as possible for a visit in 1 week(s).   Specialty: Internal  Medicine Contact information: [address] [place] [address] [phone]                No Known Allergies  Consultations: None   Procedures/Studies: CT ABDOMEN PELVIS WO CONTRAST Result Date: 06/23/2023 CLINICAL DATA:  Abdominal pain, acute, nonlocalized, no bowel movement for 10 days EXAM: CT ABDOMEN AND PELVIS WITHOUT CONTRAST TECHNIQUE: Multidetector CT imaging of the abdomen and pelvis was performed following the standard protocol without IV contrast. RADIATION DOSE REDUCTION: This exam was performed according to the departmental dose-optimization program which includes automated exposure control, adjustment of the mA and/or kV according to patient size and/or use of iterative reconstruction technique. COMPARISON:  March 18, 2021 FINDINGS: Of note, the lack of intravenous contrast limits evaluation of the solid organ parenchyma and vascularity. Lower chest: No focal airspace consolidation or pleural effusion. Subsegmental atelectasis in the right lower lobe. Dense multi-vessel coronary atherosclerosis. Hepatobiliary: No mass. No radiopaque stones or wall thickening of the gallbladder. No intrahepatic or extrahepatic biliary ductal dilation. Pancreas: Diffuse fatty atrophy of the pancreatic parenchyma. No mass or ductal dilation. No peripancreatic inflammation or fluid collection. Spleen: Normal size. No mass. Adrenals/Urinary Tract: No adrenal masses. No renal mass. No hydronephrosis or nephrolithiasis. Decompressed with wall thickening and trabeculation. Well-positioned suprapubic catheter in place. Small bladder calculus noted. Stomach/Bowel: The stomach is decompressed without focal abnormality. No small bowel wall thickening or inflammation. No small bowel obstruction. Normal appendix. Large volume fecal loading throughout the colon. Vascular/Lymphatic: No aortic aneurysm. Scattered aortoiliac atherosclerosis. No intraabdominal or pelvic lymphadenopathy. Reproductive: No  prostatomegaly. No free pelvic fluid. Other: No pneumoperitoneum, ascites, or mesenteric inflammation. Musculoskeletal: No acute fracture or destructive lesion. Superior subluxation of the right femur with respect to the acetabulum. Extensive bony cortical destruction of the entire femoral head with similar shallow, irregular acetabulum. Pronounced soft tissue surrounding the right femoroacetabular joint. Multilevel degenerative disc disease of the spine. Moderate joint space loss of the left hip. The femur is otherwise normal on the left. IMPRESSION: 1. Large volume fecal loading throughout the colon, consistent with constipation. 2. Well-positioned suprapubic catheter in place. Wall thickening and trabeculation of the urinary bladder, may be due to a neurogenic bladder or chronic inflammation. Correlation with urinalysis recommended to exclude acute cystitis. Electronically Signed   By: Rance Burrows M.D.   On: 06/23/2023 14:08     Discharge Exam: Vitals:   06/24/23 1930 06/25/23 0327  BP: (!) 138/59 (!) 128/59  Pulse: 99 (!) 103  Resp: 20 20  Temp: 98.1 F (36.7 C) 98.5 F (36.9 C)  SpO2: 96% 94%   Vitals:   06/24/23 0427 06/24/23 1455 06/24/23 1930 06/25/23 0327  BP: (!) 128/56 (!) 117/49 (!) 138/59 (!) 128/59  Pulse: (!) 108 98 99 (!) 103  Resp: 20 18 20 20   Temp: 98.2 F (36.8 C) 98 F (36.7  C) 98.1 F (36.7 C) 98.5 F (36.9 C)  TempSrc: Oral Oral Oral Oral  SpO2: 94% 96% 96% 94%  Weight:      Height:        General: Pt is alert, awake, not in acute distress Cardiovascular: RRR, S1/S2 +, no rubs, no gallops Respiratory: CTA bilaterally, no wheezing, no rhonchi Abdominal: Soft, NT, ND, bowel sounds + Extremities: no edema, no cyanosis Suprapubic catheter with clear, yellow urine output noted.   The results of significant diagnostics from this hospitalization (including imaging, microbiology, ancillary and laboratory) are listed below for reference.      Microbiology: Recent Results (from the past 240 hours)  Urine Culture     Status: Abnormal (Preliminary result)   Collection Time: 06/23/23  1:15 PM   Specimen: Urine, Catheterized  Result Value Ref Range Status   Specimen Description   Final    URINE, CATHETERIZED Performed at [facility], [address], [place], [place] [address]    Special Requests   Final    NONE Performed at [facility], [address], [place], [place] [address]    Culture (A)  Final    20,000 COLONIES/mL GRAM NEGATIVE RODS CULTURE REINCUBATED FOR BETTER GROWTH SUSCEPTIBILITIES TO FOLLOW Performed at [facility] [place] [facility], [address], [place], [place] [address]    Report Status PENDING  Incomplete     Labs: BNP (last 3 results) No results for input(s): "BNP" in the last 8760 hours. Basic Metabolic Panel: Recent Labs  Lab 06/23/23 1110 06/24/23 0433 06/25/23 0731  NA 126* 135 134*  K 4.4 4.4 4.4  CL 91* 103 105  CO2 22 22 20*  GLUCOSE 235* 157* 139*  BUN 30* 22 17  CREATININE 1.56* 1.36* 1.16  CALCIUM 8.6* 7.7* 8.1*  MG  --   --  1.8   Liver Function Tests: Recent Labs  Lab 06/23/23 1110  AST 24  ALT 22  ALKPHOS 92  BILITOT 1.2  PROT 7.3  ALBUMIN 2.0*   Recent Labs  Lab 06/23/23 1110  LIPASE 21   No results for input(s): "AMMONIA" in the last 168 hours. CBC: Recent Labs  Lab 06/23/23 1110 06/24/23 0433 06/25/23 0731  WBC 10.1 8.9 11.4*  NEUTROABS 8.8*  --   --   HGB 8.5* 7.9* 8.1*  HCT 26.2* 23.2* 24.2*  MCV 86.5 85.9 86.7  PLT 425* 440* 468*   Cardiac Enzymes: No results for input(s): "CKTOTAL", "CKMB", "CKMBINDEX", "TROPONINI" in the last 168 hours. BNP: Invalid input(s): "POCBNP" CBG: Recent Labs  Lab 06/24/23 0741 06/24/23 1112 06/24/23 1610 06/24/23 2120 06/25/23 0733  GLUCAP 173* 193* 203* 115* 136*   D-Dimer No results for input(s): "DDIMER" in the last 72 hours. Hgb A1c Recent Labs    06/23/23 1110  HGBA1C 7.0*   Lipid Profile No  results for input(s): "CHOL", "HDL", "LDLCALC", "TRIG", "CHOLHDL", "LDLDIRECT" in the last 72 hours. Thyroid  function studies No results for input(s): "TSH", "T4TOTAL", "T3FREE", "THYROIDAB" in the last 72 hours.  Invalid input(s): "FREET3" Anemia work up No results for input(s): "VITAMINB12", "FOLATE", "FERRITIN", "TIBC", "IRON", "RETICCTPCT" in the last 72 hours. Urinalysis    Component Value Date/Time   COLORURINE YELLOW 06/23/2023 1236   APPEARANCEUR CLEAR 06/23/2023 1236   APPEARANCEUR Cloudy (A) 04/13/2021 1059   LABSPEC 1.004 (L) 06/23/2023 1236   PHURINE 5.0 06/23/2023 1236   GLUCOSEU NEGATIVE 06/23/2023 1236   HGBUR MODERATE (A) 06/23/2023 1236   BILIRUBINUR NEGATIVE 06/23/2023 1236   BILIRUBINUR Negative  04/13/2021 1059   KETONESUR NEGATIVE 06/23/2023 1236   PROTEINUR NEGATIVE 06/23/2023 1236   UROBILINOGEN 1.0 09/21/2012 0530   NITRITE NEGATIVE 06/23/2023 1236   LEUKOCYTESUR TRACE (A) 06/23/2023 1236   Sepsis Labs Recent Labs  Lab 06/23/23 1110 06/24/23 0433 06/25/23 0731  WBC 10.1 8.9 11.4*   Microbiology Recent Results (from the past 240 hours)  Urine Culture     Status: Abnormal (Preliminary result)   Collection Time: 06/23/23  1:15 PM   Specimen: Urine, Catheterized  Result Value Ref Range Status   Specimen Description   Final    URINE, CATHETERIZED Performed at [facility], [address], [place], [place] [address]    Special Requests   Final    NONE Performed at [facility], [address], [place], [place] [address]    Culture (A)  Final    20,000 COLONIES/mL GRAM NEGATIVE RODS CULTURE REINCUBATED FOR BETTER GROWTH SUSCEPTIBILITIES TO FOLLOW Performed at [facility], [address], [place], [place] [address]    Report Status PENDING  Incomplete     Time coordinating discharge: 35 minutes  SIGNED:   Cornelius Dill, DO [facility] 06/25/2023, 10:40 AM  If 7PM-7AM, please contact night-coverage [email or web address]

## 2023-06-27 LAB — URINE CULTURE: Culture: 20000 — AB

## 2023-06-28 DIAGNOSIS — N390 Urinary tract infection, site not specified: Secondary | ICD-10-CM | POA: Diagnosis not present

## 2023-06-28 DIAGNOSIS — Z7984 Long term (current) use of oral hypoglycemic drugs: Secondary | ICD-10-CM | POA: Diagnosis not present

## 2023-06-28 DIAGNOSIS — Z791 Long term (current) use of non-steroidal anti-inflammatories (NSAID): Secondary | ICD-10-CM | POA: Diagnosis not present

## 2023-06-28 DIAGNOSIS — Z556 Problems related to health literacy: Secondary | ICD-10-CM | POA: Diagnosis not present

## 2023-06-28 DIAGNOSIS — E119 Type 2 diabetes mellitus without complications: Secondary | ICD-10-CM | POA: Diagnosis not present

## 2023-06-28 DIAGNOSIS — Z87891 Personal history of nicotine dependence: Secondary | ICD-10-CM | POA: Diagnosis not present

## 2023-06-28 DIAGNOSIS — E871 Hypo-osmolality and hyponatremia: Secondary | ICD-10-CM | POA: Diagnosis not present

## 2023-06-28 DIAGNOSIS — K5900 Constipation, unspecified: Secondary | ICD-10-CM | POA: Diagnosis not present

## 2023-06-29 DIAGNOSIS — E46 Unspecified protein-calorie malnutrition: Secondary | ICD-10-CM | POA: Diagnosis not present

## 2023-06-29 DIAGNOSIS — I10 Essential (primary) hypertension: Secondary | ICD-10-CM | POA: Diagnosis not present

## 2023-06-29 DIAGNOSIS — Z09 Encounter for follow-up examination after completed treatment for conditions other than malignant neoplasm: Secondary | ICD-10-CM | POA: Diagnosis not present

## 2023-06-29 DIAGNOSIS — E1165 Type 2 diabetes mellitus with hyperglycemia: Secondary | ICD-10-CM | POA: Diagnosis not present

## 2023-06-29 DIAGNOSIS — Z299 Encounter for prophylactic measures, unspecified: Secondary | ICD-10-CM | POA: Diagnosis not present

## 2023-06-30 ENCOUNTER — Ambulatory Visit

## 2023-06-30 DIAGNOSIS — R339 Retention of urine, unspecified: Secondary | ICD-10-CM

## 2023-06-30 MED ORDER — CIPROFLOXACIN HCL 500 MG PO TABS
500.0000 mg | ORAL_TABLET | Freq: Once | ORAL | Status: AC
  Administered 2023-06-30: 500 mg via ORAL

## 2023-06-30 NOTE — Progress Notes (Signed)
 Suprapubic Cath Change  Patient is present today for a suprapubic catheter change due to urinary retention.  10ml of water was drained from the balloon, a 16FR foley cath was removed from the tract with out difficulty.  Suprapubic catheter site was cleaned and prepped in a sterile fashion with [id number]  A 16FR foley cath was replaced into the tract no complications were noted. Urine return was noted, urine Dark yellow in color . 10 ml of sterile water was inflated into the balloon and a bed bag was attached for drainage.  Patient tolerated well. A night bag was given to patient and proper instruction was given on how to switch bags.    Performed by: Melvenia Stabs. CMA  Follow up: 4 weeks

## 2023-07-05 DIAGNOSIS — Z791 Long term (current) use of non-steroidal anti-inflammatories (NSAID): Secondary | ICD-10-CM | POA: Diagnosis not present

## 2023-07-05 DIAGNOSIS — Z7984 Long term (current) use of oral hypoglycemic drugs: Secondary | ICD-10-CM | POA: Diagnosis not present

## 2023-07-05 DIAGNOSIS — Z87891 Personal history of nicotine dependence: Secondary | ICD-10-CM | POA: Diagnosis not present

## 2023-07-05 DIAGNOSIS — E119 Type 2 diabetes mellitus without complications: Secondary | ICD-10-CM | POA: Diagnosis not present

## 2023-07-05 DIAGNOSIS — K5900 Constipation, unspecified: Secondary | ICD-10-CM | POA: Diagnosis not present

## 2023-07-05 DIAGNOSIS — N390 Urinary tract infection, site not specified: Secondary | ICD-10-CM | POA: Diagnosis not present

## 2023-07-05 DIAGNOSIS — E871 Hypo-osmolality and hyponatremia: Secondary | ICD-10-CM | POA: Diagnosis not present

## 2023-07-05 DIAGNOSIS — Z556 Problems related to health literacy: Secondary | ICD-10-CM | POA: Diagnosis not present

## 2023-07-07 DIAGNOSIS — Z791 Long term (current) use of non-steroidal anti-inflammatories (NSAID): Secondary | ICD-10-CM | POA: Diagnosis not present

## 2023-07-07 DIAGNOSIS — Z87891 Personal history of nicotine dependence: Secondary | ICD-10-CM | POA: Diagnosis not present

## 2023-07-07 DIAGNOSIS — Z7984 Long term (current) use of oral hypoglycemic drugs: Secondary | ICD-10-CM | POA: Diagnosis not present

## 2023-07-07 DIAGNOSIS — K5900 Constipation, unspecified: Secondary | ICD-10-CM | POA: Diagnosis not present

## 2023-07-07 DIAGNOSIS — N390 Urinary tract infection, site not specified: Secondary | ICD-10-CM | POA: Diagnosis not present

## 2023-07-07 DIAGNOSIS — Z556 Problems related to health literacy: Secondary | ICD-10-CM | POA: Diagnosis not present

## 2023-07-07 DIAGNOSIS — E119 Type 2 diabetes mellitus without complications: Secondary | ICD-10-CM | POA: Diagnosis not present

## 2023-07-07 DIAGNOSIS — E871 Hypo-osmolality and hyponatremia: Secondary | ICD-10-CM | POA: Diagnosis not present

## 2023-07-08 DIAGNOSIS — M25551 Pain in right hip: Secondary | ICD-10-CM | POA: Diagnosis not present

## 2023-07-08 DIAGNOSIS — N390 Urinary tract infection, site not specified: Secondary | ICD-10-CM | POA: Diagnosis not present

## 2023-07-08 DIAGNOSIS — I10 Essential (primary) hypertension: Secondary | ICD-10-CM | POA: Diagnosis not present

## 2023-07-09 ENCOUNTER — Emergency Department (HOSPITAL_COMMUNITY)

## 2023-07-09 ENCOUNTER — Other Ambulatory Visit: Payer: Self-pay

## 2023-07-09 ENCOUNTER — Inpatient Hospital Stay (HOSPITAL_COMMUNITY)
Admission: EM | Admit: 2023-07-09 | Discharge: 2023-07-15 | DRG: 699 | Disposition: A | Discharge status: Home Health | Attending: Internal Medicine | Admitting: Internal Medicine

## 2023-07-09 ENCOUNTER — Encounter (HOSPITAL_COMMUNITY): Payer: Self-pay | Admitting: Emergency Medicine

## 2023-07-09 DIAGNOSIS — K429 Umbilical hernia without obstruction or gangrene: Secondary | ICD-10-CM | POA: Diagnosis not present

## 2023-07-09 DIAGNOSIS — E11621 Type 2 diabetes mellitus with foot ulcer: Secondary | ICD-10-CM | POA: Diagnosis present

## 2023-07-09 DIAGNOSIS — F200 Paranoid schizophrenia: Secondary | ICD-10-CM | POA: Diagnosis present

## 2023-07-09 DIAGNOSIS — B9562 Methicillin resistant Staphylococcus aureus infection as the cause of diseases classified elsewhere: Secondary | ICD-10-CM | POA: Diagnosis present

## 2023-07-09 DIAGNOSIS — Y738 Miscellaneous gastroenterology and urology devices associated with adverse incidents, not elsewhere classified: Secondary | ICD-10-CM | POA: Diagnosis not present

## 2023-07-09 DIAGNOSIS — I10 Essential (primary) hypertension: Secondary | ICD-10-CM | POA: Diagnosis present

## 2023-07-09 DIAGNOSIS — Z79890 Hormone replacement therapy (postmenopausal): Secondary | ICD-10-CM

## 2023-07-09 DIAGNOSIS — E785 Hyperlipidemia, unspecified: Secondary | ICD-10-CM | POA: Diagnosis present

## 2023-07-09 DIAGNOSIS — I6782 Cerebral ischemia: Secondary | ICD-10-CM | POA: Diagnosis not present

## 2023-07-09 DIAGNOSIS — Z6827 Body mass index (BMI) 27.0-27.9, adult: Secondary | ICD-10-CM

## 2023-07-09 DIAGNOSIS — E861 Hypovolemia: Secondary | ICD-10-CM | POA: Diagnosis present

## 2023-07-09 DIAGNOSIS — N312 Flaccid neuropathic bladder, not elsewhere classified: Secondary | ICD-10-CM | POA: Diagnosis present

## 2023-07-09 DIAGNOSIS — Z9359 Other cystostomy status: Secondary | ICD-10-CM | POA: Diagnosis not present

## 2023-07-09 DIAGNOSIS — E440 Moderate protein-calorie malnutrition: Secondary | ICD-10-CM | POA: Insufficient documentation

## 2023-07-09 DIAGNOSIS — E871 Hypo-osmolality and hyponatremia: Secondary | ICD-10-CM | POA: Diagnosis present

## 2023-07-09 DIAGNOSIS — M7989 Other specified soft tissue disorders: Secondary | ICD-10-CM | POA: Diagnosis not present

## 2023-07-09 DIAGNOSIS — F0390 Unspecified dementia without behavioral disturbance: Secondary | ICD-10-CM | POA: Diagnosis present

## 2023-07-09 DIAGNOSIS — R6889 Other general symptoms and signs: Secondary | ICD-10-CM | POA: Diagnosis not present

## 2023-07-09 DIAGNOSIS — E860 Dehydration: Secondary | ICD-10-CM | POA: Diagnosis present

## 2023-07-09 DIAGNOSIS — R918 Other nonspecific abnormal finding of lung field: Secondary | ICD-10-CM | POA: Diagnosis not present

## 2023-07-09 DIAGNOSIS — Z7984 Long term (current) use of oral hypoglycemic drugs: Secondary | ICD-10-CM

## 2023-07-09 DIAGNOSIS — R7881 Bacteremia: Secondary | ICD-10-CM | POA: Diagnosis present

## 2023-07-09 DIAGNOSIS — R627 Adult failure to thrive: Secondary | ICD-10-CM | POA: Insufficient documentation

## 2023-07-09 DIAGNOSIS — D649 Anemia, unspecified: Secondary | ICD-10-CM | POA: Diagnosis present

## 2023-07-09 DIAGNOSIS — E039 Hypothyroidism, unspecified: Secondary | ICD-10-CM | POA: Diagnosis present

## 2023-07-09 DIAGNOSIS — E872 Acidosis, unspecified: Secondary | ICD-10-CM | POA: Insufficient documentation

## 2023-07-09 DIAGNOSIS — T83030A Leakage of cystostomy catheter, initial encounter: Secondary | ICD-10-CM | POA: Diagnosis not present

## 2023-07-09 DIAGNOSIS — R410 Disorientation, unspecified: Secondary | ICD-10-CM | POA: Diagnosis not present

## 2023-07-09 DIAGNOSIS — L89610 Pressure ulcer of right heel, unstageable: Secondary | ICD-10-CM | POA: Diagnosis present

## 2023-07-09 DIAGNOSIS — R161 Splenomegaly, not elsewhere classified: Secondary | ICD-10-CM | POA: Diagnosis not present

## 2023-07-09 DIAGNOSIS — Z791 Long term (current) use of non-steroidal anti-inflammatories (NSAID): Secondary | ICD-10-CM

## 2023-07-09 DIAGNOSIS — N179 Acute kidney failure, unspecified: Principal | ICD-10-CM | POA: Diagnosis present

## 2023-07-09 DIAGNOSIS — R262 Difficulty in walking, not elsewhere classified: Secondary | ICD-10-CM | POA: Diagnosis present

## 2023-07-09 DIAGNOSIS — R531 Weakness: Principal | ICD-10-CM

## 2023-07-09 DIAGNOSIS — T83510A Infection and inflammatory reaction due to cystostomy catheter, initial encounter: Secondary | ICD-10-CM | POA: Diagnosis present

## 2023-07-09 DIAGNOSIS — E119 Type 2 diabetes mellitus without complications: Secondary | ICD-10-CM

## 2023-07-09 DIAGNOSIS — E46 Unspecified protein-calorie malnutrition: Secondary | ICD-10-CM | POA: Insufficient documentation

## 2023-07-09 DIAGNOSIS — D696 Thrombocytopenia, unspecified: Secondary | ICD-10-CM | POA: Diagnosis present

## 2023-07-09 DIAGNOSIS — K219 Gastro-esophageal reflux disease without esophagitis: Secondary | ICD-10-CM | POA: Diagnosis present

## 2023-07-09 DIAGNOSIS — Z789 Other specified health status: Secondary | ICD-10-CM

## 2023-07-09 DIAGNOSIS — R739 Hyperglycemia, unspecified: Secondary | ICD-10-CM | POA: Diagnosis not present

## 2023-07-09 DIAGNOSIS — N390 Urinary tract infection, site not specified: Secondary | ICD-10-CM | POA: Diagnosis present

## 2023-07-09 DIAGNOSIS — L97419 Non-pressure chronic ulcer of right heel and midfoot with unspecified severity: Secondary | ICD-10-CM | POA: Diagnosis present

## 2023-07-09 DIAGNOSIS — Z87891 Personal history of nicotine dependence: Secondary | ICD-10-CM

## 2023-07-09 DIAGNOSIS — I499 Cardiac arrhythmia, unspecified: Secondary | ICD-10-CM | POA: Diagnosis not present

## 2023-07-09 DIAGNOSIS — E139 Other specified diabetes mellitus without complications: Secondary | ICD-10-CM | POA: Diagnosis not present

## 2023-07-09 DIAGNOSIS — Z79899 Other long term (current) drug therapy: Secondary | ICD-10-CM

## 2023-07-09 DIAGNOSIS — K5900 Constipation, unspecified: Secondary | ICD-10-CM | POA: Diagnosis present

## 2023-07-09 DIAGNOSIS — R140 Abdominal distension (gaseous): Secondary | ICD-10-CM | POA: Diagnosis not present

## 2023-07-09 LAB — IRON AND TIBC
Iron: 15 ug/dL — ABNORMAL LOW (ref 45–182)
Saturation Ratios: 9 % — ABNORMAL LOW (ref 17.9–39.5)
TIBC: 163 ug/dL — ABNORMAL LOW (ref 250–450)
UIBC: 148 ug/dL

## 2023-07-09 LAB — COMPREHENSIVE METABOLIC PANEL WITH GFR
ALT: 20 U/L (ref 0–44)
AST: 26 U/L (ref 15–41)
Albumin: 1.7 g/dL — ABNORMAL LOW (ref 3.5–5.0)
Alkaline Phosphatase: 105 U/L (ref 38–126)
Anion gap: 11 (ref 5–15)
BUN: 36 mg/dL — ABNORMAL HIGH (ref 8–23)
CO2: 19 mmol/L — ABNORMAL LOW (ref 22–32)
Calcium: 7.9 mg/dL — ABNORMAL LOW (ref 8.9–10.3)
Chloride: 94 mmol/L — ABNORMAL LOW (ref 98–111)
Creatinine, Ser: 2.09 mg/dL — ABNORMAL HIGH (ref 0.61–1.24)
GFR, Estimated: 34 mL/min — ABNORMAL LOW (ref >60)
Glucose, Bld: 228 mg/dL — ABNORMAL HIGH (ref 70–99)
Potassium: 4.5 mmol/L (ref 3.5–5.1)
Sodium: 124 mmol/L — ABNORMAL LOW (ref 135–145)
Total Bilirubin: 0.9 mg/dL (ref 0.0–1.2)
Total Protein: 7.9 g/dL (ref 6.5–8.1)

## 2023-07-09 LAB — FOLATE: Folate: 10.5 ng/mL (ref >5.9)

## 2023-07-09 LAB — CBC WITH DIFFERENTIAL/PLATELET
Abs Immature Granulocytes: 0.05 10*3/uL (ref 0.00–0.07)
Basophils Absolute: 0 10*3/uL (ref 0.0–0.1)
Basophils Relative: 0 %
Eosinophils Absolute: 0 10*3/uL (ref 0.0–0.5)
Eosinophils Relative: 0 %
HCT: 23.8 % — ABNORMAL LOW (ref 39.0–52.0)
Hemoglobin: 7.6 g/dL — ABNORMAL LOW (ref 13.0–17.0)
Immature Granulocytes: 1 %
Lymphocytes Relative: 9 %
Lymphs Abs: 1 10*3/uL (ref 0.7–4.0)
MCH: 27.7 pg (ref 26.0–34.0)
MCHC: 31.9 g/dL (ref 30.0–36.0)
MCV: 86.9 fL (ref 80.0–100.0)
Monocytes Absolute: 0.5 10*3/uL (ref 0.1–1.0)
Monocytes Relative: 4 %
Neutro Abs: 9.3 10*3/uL — ABNORMAL HIGH (ref 1.7–7.7)
Neutrophils Relative %: 86 %
Platelets: 412 10*3/uL — ABNORMAL HIGH (ref 150–400)
RBC: 2.74 MIL/uL — ABNORMAL LOW (ref 4.22–5.81)
RDW: 15.7 % — ABNORMAL HIGH (ref 11.5–15.5)
WBC: 10.9 10*3/uL — ABNORMAL HIGH (ref 4.0–10.5)
nRBC: 0 % (ref 0.0–0.2)

## 2023-07-09 LAB — CBG MONITORING, ED: Glucose-Capillary: 229 mg/dL — ABNORMAL HIGH (ref 70–99)

## 2023-07-09 LAB — URINALYSIS, W/ REFLEX TO CULTURE (INFECTION SUSPECTED)
Bilirubin Urine: NEGATIVE
Glucose, UA: NEGATIVE mg/dL
Ketones, ur: NEGATIVE mg/dL
Nitrite: NEGATIVE
Protein, ur: 30 mg/dL — AB
Specific Gravity, Urine: 1.018 (ref 1.005–1.030)
WBC, UA: >50 WBC/hpf (ref 0–5)
pH: 5 (ref 5.0–8.0)

## 2023-07-09 LAB — GLUCOSE, CAPILLARY
Glucose-Capillary: 189 mg/dL — ABNORMAL HIGH (ref 70–99)
Glucose-Capillary: 193 mg/dL — ABNORMAL HIGH (ref 70–99)

## 2023-07-09 LAB — RETICULOCYTES
Immature Retic Fract: 18.2 % — ABNORMAL HIGH (ref 2.3–15.9)
RBC.: 2.7 MIL/uL — ABNORMAL LOW (ref 4.22–5.81)
Retic Count, Absolute: 40.5 10*3/uL (ref 19.0–186.0)
Retic Ct Pct: 1.5 % (ref 0.4–3.1)

## 2023-07-09 LAB — FERRITIN: Ferritin: 676 ng/mL — ABNORMAL HIGH (ref 24–336)

## 2023-07-09 LAB — RESP PANEL BY RT-PCR (RSV, FLU A&B, COVID)  RVPGX2
Influenza A by PCR: NEGATIVE
Influenza B by PCR: NEGATIVE
Resp Syncytial Virus by PCR: NEGATIVE
SARS Coronavirus 2 by RT PCR: NEGATIVE

## 2023-07-09 LAB — LACTIC ACID, PLASMA
Lactic Acid, Venous: 2.3 mmol/L (ref 0.5–1.9)
Lactic Acid, Venous: 2.4 mmol/L (ref 0.5–1.9)
Lactic Acid, Venous: 2.3 mmol/L (ref 0.5–1.9)
Lactic Acid, Venous: 2.2 mmol/L (ref 0.5–1.9)

## 2023-07-09 LAB — VITAMIN B12: Vitamin B-12: 145 pg/mL — ABNORMAL LOW (ref 180–914)

## 2023-07-09 LAB — BRAIN NATRIURETIC PEPTIDE: B Natriuretic Peptide: 40 pg/mL (ref 0.0–100.0)

## 2023-07-09 LAB — CK: Total CK: 113 U/L (ref 49–397)

## 2023-07-09 MED ORDER — QUETIAPINE FUMARATE 100 MG PO TABS
600.0000 mg | ORAL_TABLET | Freq: Every day | ORAL | Status: DC
Start: 2023-07-09 — End: 2023-07-15
  Administered 2023-07-09 – 2023-07-14 (×6): 600 mg via ORAL
  Filled 2023-07-09 (×6): qty 6

## 2023-07-09 MED ORDER — PALIPERIDONE ER 3 MG PO TB24
3.0000 mg | ORAL_TABLET | Freq: Two times a day (BID) | ORAL | Status: DC
  Administered 2023-07-09 – 2023-07-13 (×9): 3 mg via ORAL
  Filled 2023-07-09 (×8): qty 1

## 2023-07-09 MED ORDER — LEVOTHYROXINE SODIUM 50 MCG PO TABS
75.0000 ug | ORAL_TABLET | Freq: Every day | ORAL | Status: DC
  Administered 2023-07-10 – 2023-07-15 (×6): 75 ug via ORAL
  Filled 2023-07-09 (×6): qty 2

## 2023-07-09 MED ORDER — ALBUMIN HUMAN 25 % IV SOLN
25.0000 g | Freq: Once | INTRAVENOUS | Status: AC
  Administered 2023-07-09: 25 g via INTRAVENOUS
  Filled 2023-07-09: qty 100

## 2023-07-09 MED ORDER — ROSUVASTATIN CALCIUM 5 MG PO TABS
5.0000 mg | ORAL_TABLET | ORAL | Status: DC
  Administered 2023-07-15: 5 mg via ORAL

## 2023-07-09 MED ORDER — BENZTROPINE MESYLATE 1 MG PO TABS
2.0000 mg | ORAL_TABLET | Freq: Two times a day (BID) | ORAL | Status: DC
  Administered 2023-07-09 – 2023-07-15 (×12): 2 mg via ORAL
  Filled 2023-07-09 (×12): qty 2

## 2023-07-09 MED ORDER — ACETAMINOPHEN 650 MG RE SUPP
650.0000 mg | Freq: Four times a day (QID) | RECTAL | Status: DC | PRN
Start: 2023-07-09 — End: 2023-07-15

## 2023-07-09 MED ORDER — LACTATED RINGERS IV BOLUS: 500.0000 mL | Freq: Once | INTRAVENOUS | Status: DC

## 2023-07-09 MED ORDER — INSULIN GLARGINE-YFGN 100 UNIT/ML ~~LOC~~ SOLN
10.0000 [IU] | Freq: Every day | SUBCUTANEOUS | Status: DC
  Administered 2023-07-09 – 2023-07-14 (×6): 10 [IU] via SUBCUTANEOUS
  Filled 2023-07-09 (×7): qty 0.1

## 2023-07-09 MED ORDER — ENSURE MAX PROTEIN PO LIQD
11.0000 [oz_av] | Freq: Two times a day (BID) | ORAL | Status: DC
  Administered 2023-07-09 – 2023-07-11 (×4): 11 [oz_av] via ORAL

## 2023-07-09 MED ORDER — ACETAMINOPHEN 325 MG PO TABS
650.0000 mg | ORAL_TABLET | Freq: Four times a day (QID) | ORAL | Status: DC | PRN
  Administered 2023-07-11 – 2023-07-12 (×4): 650 mg via ORAL
  Filled 2023-07-09 (×4): qty 2

## 2023-07-09 MED ORDER — LACTATED RINGERS IV SOLN: INTRAVENOUS | Status: AC

## 2023-07-09 MED ORDER — LACTATED RINGERS IV BOLUS: 1000.0000 mL | Freq: Once | INTRAVENOUS | Status: DC

## 2023-07-09 MED ORDER — ENOXAPARIN SODIUM 40 MG/0.4ML IJ SOSY
40.0000 mg | PREFILLED_SYRINGE | INTRAMUSCULAR | Status: DC
  Administered 2023-07-09 – 2023-07-14 (×6): 40 mg via SUBCUTANEOUS
  Filled 2023-07-09 (×6): qty 0.4

## 2023-07-09 MED ORDER — PANTOPRAZOLE SODIUM 40 MG PO TBEC
40.0000 mg | DELAYED_RELEASE_TABLET | Freq: Every day | ORAL | Status: DC
  Administered 2023-07-10 – 2023-07-15 (×6): 40 mg via ORAL
  Filled 2023-07-09 (×6): qty 1

## 2023-07-09 MED ORDER — SODIUM CHLORIDE 0.9 % IV BOLUS
500.0000 mL | Freq: Once | INTRAVENOUS | Status: AC
  Administered 2023-07-09: 500 mL via INTRAVENOUS

## 2023-07-09 MED ORDER — INSULIN ASPART 100 UNIT/ML IJ SOLN
0.0000 [IU] | Freq: Every day | INTRAMUSCULAR | Status: DC
  Administered 2023-07-10 – 2023-07-13 (×3): 2 [IU] via SUBCUTANEOUS

## 2023-07-09 MED ORDER — INSULIN ASPART 100 UNIT/ML IJ SOLN
0.0000 [IU] | Freq: Three times a day (TID) | INTRAMUSCULAR | Status: DC
  Administered 2023-07-09 – 2023-07-10 (×4): 3 [IU] via SUBCUTANEOUS
  Administered 2023-07-11 (×2): 2 [IU] via SUBCUTANEOUS
  Administered 2023-07-12: 3 [IU] via SUBCUTANEOUS
  Administered 2023-07-12: 5 [IU] via SUBCUTANEOUS
  Administered 2023-07-12: 2 [IU] via SUBCUTANEOUS
  Administered 2023-07-13: 5 [IU] via SUBCUTANEOUS
  Administered 2023-07-13: 2 [IU] via SUBCUTANEOUS
  Administered 2023-07-13: 3 [IU] via SUBCUTANEOUS
  Administered 2023-07-14: 8 [IU] via SUBCUTANEOUS
  Administered 2023-07-14: 3 [IU] via SUBCUTANEOUS
  Administered 2023-07-14: 1 [IU] via SUBCUTANEOUS
  Administered 2023-07-15: 5 [IU] via SUBCUTANEOUS
  Administered 2023-07-15: 2 [IU] via SUBCUTANEOUS

## 2023-07-09 MED ORDER — SODIUM CHLORIDE 0.9 % IV BOLUS
1000.0000 mL | Freq: Once | INTRAVENOUS | Status: AC
  Administered 2023-07-09: 1000 mL via INTRAVENOUS

## 2023-07-09 MED ORDER — FLEET ENEMA RE ENEM
1.0000 | ENEMA | RECTAL | Status: AC
  Administered 2023-07-09: 1 via RECTAL

## 2023-07-09 MED ORDER — SODIUM CHLORIDE 0.9 % IV SOLN
1.0000 g | INTRAVENOUS | Status: DC
  Administered 2023-07-09 – 2023-07-10 (×2): 1 g via INTRAVENOUS
  Filled 2023-07-09 (×2): qty 10

## 2023-07-09 MED ORDER — POLYETHYLENE GLYCOL 3350 17 G PO PACK
17.0000 g | PACK | Freq: Two times a day (BID) | ORAL | Status: DC
  Administered 2023-07-09 – 2023-07-14 (×10): 17 g via ORAL
  Filled 2023-07-09 (×11): qty 1

## 2023-07-09 MED ORDER — LACTATED RINGERS IV BOLUS
1000.0000 mL | Freq: Once | INTRAVENOUS | Status: AC
  Administered 2023-07-09: 1000 mL via INTRAVENOUS

## 2023-07-09 NOTE — H&P (Signed)
 History and Physical    Patient: [name]:[id number] DOB: 1955-03-31 DOA: 07/09/2023 DOS: the patient was seen and examined on 07/09/2023 PCP: [name], MD  Patient coming from: Home  Chief Complaint:  Chief Complaint  Patient presents with   Weakness   HPI: [name] is a 68 y.o. male with medical history significant of dementia, schizophrenia, hypertension, hypothyroidism, diabetes.  Patient is unable able to provide much history due to cognitive decline.  History is obtained by the patient's family who note that he has been appearing a little bit more confused over the last 2 to 3 days.  He has also had decreased appetite.  His decreased appetite has been going on a little bit longer -closer to 2 to 3 weeks.  His sister does note that he has been taking a protein supplement 3 times a day but that his appetite is decreased regardless.  No fevers, chills, nausea, vomiting.  He has been constipated.  He was admitted in the beginning of the month and discharged about 2 weeks ago.  At the time of his discharge, it was recommended that he go to a skilled nursing facility, but his family did not want this.  Review of Systems: unable to review all systems due to the inability of the patient to answer questions. Past Medical History:  Diagnosis Date   Schizophrenia [facility])    Past Surgical History:  Procedure Laterality Date   IR CATHETER TUBE CHANGE  08/05/2021   SHOULDER SURGERY     Social History:  reports that he has quit smoking. His smoking use included cigarettes. He does not have any smokeless tobacco history on file. He reports current alcohol  use. He reports that he does not use drugs.  No Known Allergies  History reviewed. No pertinent family history.  Prior to Admission medications   Medication Sig Start Date End Date Taking? Authorizing Provider  benztropine  (COGENTIN ) 2 MG tablet Take 1 tablet (2 mg total) by mouth at bedtime. Patient taking differently:  Take 2 mg by mouth 2 (two) times daily. 09/28/12   Ferrel Hsu, NP  donepezil (ARICEPT) 5 MG tablet Take 5 mg by mouth daily. 04/17/21   [provider]  levothyroxine  (SYNTHROID , LEVOTHROID) 75 MCG tablet Take 1 tablet (75 mcg total) by mouth daily. 09/28/12   Ferrel Hsu, NP  meloxicam (MOBIC) 15 MG tablet Take 15 mg by mouth daily. 04/10/21   [provider]  metFORMIN (GLUCOPHAGE-XR) 500 MG 24 hr tablet Take 500 mg by mouth daily. 03/13/21   [provider]  omeprazole  (PRILOSEC) 20 MG capsule Take 1 capsule (20 mg total) by mouth daily. 09/28/12   Ferrel Hsu, NP  paliperidone (INVEGA SUSTENNA) 234 MG/1.5ML SUSY injection Inject 1 mL into the muscle every 30 (thirty) days. 01/23/21   [provider]  paliperidone (INVEGA) 3 MG 24 hr tablet Take 1 tablet by mouth 2 (two) times daily. 03/13/21   [provider]  polyethylene glycol (MIRALAX  / GLYCOLAX ) 17 g packet Take 17 g by mouth 2 (two) times daily. Patient taking differently: Take 17 g by mouth 2 (two) times daily as needed for mild constipation. 03/20/21   Graciela Lava, MD  QUEtiapine (SEROQUEL) 200 MG tablet Take 600 mg by mouth at bedtime. 03/13/21   [provider]  rosuvastatin (CRESTOR) 5 MG tablet Take 5 mg by mouth once a week. Take 5 mg by mouth every week on Friday 03/13/21   [provider]    Physical Exam: Vitals:   07/09/23 1112 07/09/23 1146 07/09/23 1200 07/09/23 1500  BP: (!) 97/47  106/62 122/62  Pulse: (!) 113  (!) 104 (!) 107  Resp: 16  13 14   Temp:  (!) 97.4 F (36.3 C)    TempSrc:  Oral    SpO2: 97%  98% 98%  Weight:      Height:       General: Elderly male. Awake and alert and oriented x3. No acute cardiopulmonary distress.  HEENT: Normocephalic atraumatic.  Right and left ears normal in appearance.  Pupils equal, round, reactive to light. Extraocular muscles are intact. Sclerae anicteric and noninjected.  Moist mucosal membranes. No mucosal  lesions.  Neck: Neck supple without lymphadenopathy. No carotid bruits. No masses palpated.  Cardiovascular: Regular rate with normal S1-S2 sounds. No murmurs, rubs, gallops auscultated. No JVD.  Respiratory: Good respiratory effort with no wheezes, rales, rhonchi. Lungs clear to auscultation bilaterally.  No accessory muscle use. Abdomen: Soft, nontender, nondistended. Active bowel sounds. No masses or hepatosplenomegaly  Skin: No rashes, lesions, or ulcerations.  Dry, warm to touch. 2+ dorsalis pedis and radial pulses. Musculoskeletal: No calf or leg pain. All major joints not erythematous nontender.  No upper or lower joint deformation.  Good ROM.  No contractures  Psychiatric: No judgement or insight. Pleasant and cooperative. Neurologic: No focal neurological deficits.Cranial nerves II through XII are grossly intact.   Data Reviewed: Results for orders placed or performed during the hospital encounter of 07/09/23 (from the past 24 hours)  CBG monitoring, ED     Status: Abnormal   Collection Time: 07/09/23 11:12 AM  Result Value Ref Range   Glucose-Capillary 229 (H) 70 - 99 mg/dL  Urinalysis, w/ Reflex to Culture (Infection Suspected) -Urine, Clean Catch     Status: Abnormal   Collection Time: 07/09/23 12:52 PM  Result Value Ref Range   Specimen Source URINE, CLEAN CATCH    Color, Urine AMBER (A) YELLOW   APPearance CLOUDY (A) CLEAR   Specific Gravity, Urine 1.018 1.005 - 1.030   pH 5.0 5.0 - 8.0   Glucose, UA NEGATIVE NEGATIVE mg/dL   Hgb urine dipstick SMALL (A) NEGATIVE   Bilirubin Urine NEGATIVE NEGATIVE   Ketones, ur NEGATIVE NEGATIVE mg/dL   Protein, ur 30 (A) NEGATIVE mg/dL   Nitrite NEGATIVE NEGATIVE   Leukocytes,Ua MODERATE (A) NEGATIVE   RBC / HPF 21-50 0 - 5 RBC/hpf   WBC, UA >50 0 - 5 WBC/hpf   Bacteria, UA RARE (A) NONE SEEN   Squamous Epithelial / HPF 0-5 0 - 5 /HPF   WBC Clumps PRESENT    Mucus PRESENT    Budding Yeast PRESENT   Resp panel by RT-PCR (RSV, Flu  A&B, Covid) Urine, Clean Catch     Status: None   Collection Time: 07/09/23 12:52 PM   Specimen: Urine, Clean Catch; Nasal Swab  Result Value Ref Range   SARS Coronavirus 2 by RT PCR NEGATIVE NEGATIVE   Influenza A by PCR NEGATIVE NEGATIVE   Influenza B by PCR NEGATIVE NEGATIVE   Resp Syncytial Virus by PCR NEGATIVE NEGATIVE  Lactic acid, plasma     Status: Abnormal   Collection Time: 07/09/23  1:14 PM  Result Value Ref Range   Lactic Acid, Venous 2.3 (HH) 0.5 - 1.9 mmol/L  Comprehensive metabolic panel     Status: Abnormal   Collection Time: 07/09/23  1:14 PM  Result Value Ref Range   Sodium  124 (L) 135 - 145 mmol/L   Potassium 4.5 3.5 - 5.1 mmol/L   Chloride 94 (L) 98 - 111 mmol/L   CO2 19 (L) 22 - 32 mmol/L   Glucose, Bld 228 (H) 70 - 99 mg/dL   BUN 36 (H) 8 - 23 mg/dL   Creatinine, Ser 4.54 (H) 0.61 - 1.24 mg/dL   Calcium 7.9 (L) 8.9 - 10.3 mg/dL   Total Protein 7.9 6.5 - 8.1 g/dL   Albumin 1.7 (L) 3.5 - 5.0 g/dL   AST 26 15 - 41 U/L   ALT 20 0 - 44 U/L   Alkaline Phosphatase 105 38 - 126 U/L   Total Bilirubin 0.9 0.0 - 1.2 mg/dL   GFR, Estimated 34 (L) >60 mL/min   Anion gap 11 5 - 15  CBC with Differential     Status: Abnormal   Collection Time: 07/09/23  1:14 PM  Result Value Ref Range   WBC 10.9 (H) 4.0 - 10.5 K/uL   RBC 2.74 (L) 4.22 - 5.81 MIL/uL   Hemoglobin 7.6 (L) 13.0 - 17.0 g/dL   HCT 09.8 (L) 11.9 - 14.7 %   MCV 86.9 80.0 - 100.0 fL   MCH 27.7 26.0 - 34.0 pg   MCHC 31.9 30.0 - 36.0 g/dL   RDW 82.9 (H) 56.2 - 13.0 %   Platelets 412 (H) 150 - 400 K/uL   nRBC 0.0 0.0 - 0.2 %   Neutrophils Relative % 86 %   Neutro Abs 9.3 (H) 1.7 - 7.7 K/uL   Lymphocytes Relative 9 %   Lymphs Abs 1.0 0.7 - 4.0 K/uL   Monocytes Relative 4 %   Monocytes Absolute 0.5 0.1 - 1.0 K/uL   Eosinophils Relative 0 %   Eosinophils Absolute 0.0 0.0 - 0.5 K/uL   Basophils Relative 0 %   Basophils Absolute 0.0 0.0 - 0.1 K/uL   Immature Granulocytes 1 %   Abs Immature Granulocytes  0.05 0.00 - 0.07 K/uL  Blood Culture (routine x 2)     Status: None (Preliminary result)   Collection Time: 07/09/23  1:14 PM   Specimen: Right Antecubital; Blood  Result Value Ref Range   Specimen Description      RIGHT ANTECUBITAL BOTTLES DRAWN AEROBIC AND ANAEROBIC   Special Requests      Blood Culture adequate volume Performed at [facility], [address], [place], [place] [address]    Culture PENDING    Report Status PENDING   Blood Culture (routine x 2)     Status: None (Preliminary result)   Collection Time: 07/09/23  1:14 PM   Specimen: BLOOD RIGHT HAND  Result Value Ref Range   Specimen Description      BLOOD RIGHT HAND BOTTLES DRAWN AEROBIC AND ANAEROBIC   Special Requests      Blood Culture adequate volume Performed at [facility], [address], [place], [place] [address]    Culture PENDING    Report Status PENDING   Brain natriuretic peptide     Status: None   Collection Time: 07/09/23  1:14 PM  Result Value Ref Range   B Natriuretic Peptide 40.0 0.0 - 100.0 pg/mL  CK     Status: None   Collection Time: 07/09/23  1:14 PM  Result Value Ref Range   Total CK 113 49 - 397 U/L  Lactic acid, plasma     Status: Abnormal   Collection Time: 07/09/23  2:02 PM  Result Value Ref Range  Lactic Acid, Venous 2.4 (HH) 0.5 - 1.9 mmol/L    CT CHEST ABDOMEN PELVIS WO CONTRAST Result Date: 07/09/2023 CLINICAL DATA:  Abdominal pain, altered mental status. EXAM: CT CHEST, ABDOMEN AND PELVIS WITHOUT CONTRAST TECHNIQUE: Multidetector CT imaging of the chest, abdomen and pelvis was performed following the standard protocol without IV contrast. RADIATION DOSE REDUCTION: This exam was performed according to the departmental dose-optimization program which includes automated exposure control, adjustment of the mA and/or kV according to patient size and/or use of iterative reconstruction technique. COMPARISON:  Jun 23, 2023. FINDINGS: CT CHEST FINDINGS Cardiovascular: No evidence of  thoracic aortic aneurysm. Coronary calcifications are noted. Normal cardiac size. No pericardial effusion. Mediastinum/Nodes: No enlarged mediastinal, hilar, or axillary lymph nodes. Thyroid  gland, trachea, and esophagus demonstrate no significant findings. Lungs/Pleura: Lungs are clear. No pleural effusion or pneumothorax. Musculoskeletal: No chest wall mass or suspicious bone lesions identified. CT ABDOMEN PELVIS FINDINGS Hepatobiliary: No focal liver abnormality is seen. No gallstones, gallbladder wall thickening, or biliary dilatation. Pancreas: Unremarkable. No pancreatic ductal dilatation or surrounding inflammatory changes. Spleen: Moderate splenomegaly. Adrenals/Urinary Tract: Adrenal glands and kidneys appear normal. No hydronephrosis or renal obstruction is noted. Suprapubic bladder catheter is again noted. Stomach/Bowel: The stomach is unremarkable. The appendix appears normal. Large amount of stool seen throughout the colon. There is no evidence of bowel obstruction. Vascular/Lymphatic: No significant vascular findings are present. No enlarged abdominal or pelvic lymph nodes. Reproductive: Prostate is unremarkable. Other: No ascites is noted. Small fat containing periumbilical hernia is noted. Musculoskeletal: Stable extensive bony destruction of right femoral head with superior subluxation of the right femur relative to acetabulum. Stable rounded prominence of the medial musculature of the right hip which may represent joint effusion. IMPRESSION: Moderate splenomegaly. Large amount of stool seen throughout the colon suggesting constipation. Stable extensive bony destruction of right femoral head is noted with superior subluxation of the right femoral head relative to acetabulum, with stable prominence of the medial musculature and soft tissues of the right hip which may represent joint effusion related to the hip. 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 Coronary artery  calcifications are noted suggesting coronary artery disease. Electronically Signed   By: Rosalene Colon M.D.   On: 07/09/2023 14:44   CT Head Wo Contrast Result Date: 07/09/2023 CLINICAL DATA:  Provided history: Altered mental status. EXAM: CT HEAD WITHOUT CONTRAST TECHNIQUE: Contiguous axial images were obtained from the base of the skull through the vertex without intravenous contrast. RADIATION DOSE REDUCTION: This exam was performed according to the departmental dose-optimization program which includes automated exposure control, adjustment of the mA and/or kV according to patient size and/or use of iterative reconstruction technique. COMPARISON:  Head CT 03/17/2021. FINDINGS: Brain: Generalized cerebral atrophy. Patchy and ill-defined hypoattenuation within the cerebral white matter, nonspecific but compatible with mild chronic small vessel ischemic disease. There is no acute intracranial hemorrhage. No demarcated cortical infarct. No extra-axial fluid collection. No evidence of an intracranial mass. No midline shift. Vascular: No hyperdense vessel.  Atherosclerotic calcifications. Skull: No calvarial fracture or aggressive osseous lesion. Sinuses/Orbits: No mass or acute finding within the imaged orbits. No significant paranasal sinus disease at the imaged levels. IMPRESSION: 1. No acute intracranial finding. 2. Parenchymal atrophy and chronic small vessel ischemic disease. Electronically Signed   By: Bascom Lily D.O.   On: 07/09/2023 14:32   DG Chest Port 1 View Result Date: 07/09/2023 CLINICAL DATA:  68 year old male with possible sepsis. Confusion. Extremity swelling. EXAM:  PORTABLE CHEST 1 VIEW COMPARISON:  Portable chest 05/18/2022 and earlier. FINDINGS: Portable AP view at 1231 hours. Lordotic positioning. Lung volumes, mediastinal contours within normal limits. Visualized tracheal air column is within normal limits. No pneumothorax, pulmonary edema, pleural effusion, consolidation. But there is  streaky bilateral lower lung, bilateral lower peribronchial opacity suspected. Paucity of bowel gas in the upper abdomen. Chronic severe left shoulder degeneration. No acute osseous abnormality identified. IMPRESSION: Suspicion of bilateral lower lung opacity on this lordotic portable exam. Consider bilateral lower lobe Bronchopneumonia. No definite effusion. PA and lateral views would be helpful when feasible. Electronically Signed   By: Marlise Simpers M.D.   On: 07/09/2023 12:40    Assessment and Plan: No notes have been filed under this hospital service. Service: Hospitalist  Principal Problem:   AKI (acute kidney injury) (HCC) Active Problems:   Hyponatremia   Constipation   Hypotonic bladder   Dementia (HCC)   DM (diabetes mellitus) (HCC)   Failure to thrive in adult   Metabolic acidosis   Protein deficiency (HCC)   Normocytic anemia   Lactic acidosis  AKI IV fluids Repeat creatinine in the morning Hold nephrotoxic drugs Hyponatremia -hypovolemic Secondary to dehydration Repeat sodium tomorrow morning Failure to thrive in adult Nutrition consult Protein supplement in between meals Possible UTI with hypotonic bladder Will give Rocephin  1 g Urine culture pending Blood cultures pending  Lactic acidosis with metabolic acidosis Repeat lactic acid in a couple of hours. Will give IV fluids Normocytic anemia Iron studies Hemoccult Dementia   Advance Care Planning:   Code Status: Prior full code per patient's sister  Consults: None  Family Communication: Patient's sister  Severity of Illness: The appropriate patient status for this patient is INPATIENT. Inpatient status is judged to be reasonable and necessary in order to provide the required intensity of service to ensure the patient's safety. The patient's presenting symptoms, physical exam findings, and initial radiographic and laboratory data in the context of their chronic comorbidities is felt to place them at high risk  for further clinical deterioration. Furthermore, it is not anticipated that the patient will be medically stable for discharge from the hospital within 2 midnights of admission.   * I certify that at the point of admission it is my clinical judgment that the patient will require inpatient hospital care spanning beyond 2 midnights from the point of admission due to high intensity of service, high risk for further deterioration and high frequency of surveillance required.*  Author: Nashae Maudlin J Alexxis Mackert, DO 07/09/2023 3:13 PM  For on call review [email or web address].

## 2023-07-09 NOTE — ED Triage Notes (Signed)
 Pt to ER via EMS from home with c/o right lower leg and left hand swelling, increased confusion for last 3 days, constipation and decreased output for last 3 days.  Pt has hx of dementia and chronic foley catheter.  CBG for EMS 281, NS 500c administered en route.

## 2023-07-09 NOTE — ED Notes (Signed)
 Attempted to call report x2

## 2023-07-09 NOTE — ED Notes (Signed)
 ED TO INPATIENT HANDOFF REPORT  ED Nurse Name and Phone #: Reyce Lubeck   S Name/Age/Gender [name] 68 y.o. male Room/Bed: APA05/APA05  Code Status   Code Status: Prior  Home/SNF/Other Home Patient oriented to: self Is this baseline? Yes   Triage Complete: Triage complete  Chief Complaint AKI (acute kidney injury) (HCC) [N17.9]  Triage Note Pt to ER via EMS from home with c/o right lower leg and left hand swelling, increased confusion for last 3 days, constipation and decreased output for last 3 days.  Pt has hx of dementia and chronic foley catheter.  CBG for EMS 281, NS 500c administered en route.   Allergies No Known Allergies  Level of Care/Admitting Diagnosis ED Disposition     ED Disposition  Admit   Condition  --   Comment  Hospital Area: [facility] [[id number]]  Level of Care: Med-Surg [16]  Covid Evaluation: Asymptomatic - no recent exposure (last 10 days) testing not required  Diagnosis: AKI (acute kidney injury) [facility]) [[id number]]  Admitting Physician: STINSON, JACOB J [[id number]]  Attending Physician: STINSON, JACOB J [[id number]]  Certification:: I certify this patient will need inpatient services for at least 2 midnights  Expected Medical Readiness: 07/11/2023          B Medical/Surgery History Past Medical History:  Diagnosis Date   Schizophrenia [facility])    Past Surgical History:  Procedure Laterality Date   IR CATHETER TUBE CHANGE  08/05/2021   SHOULDER SURGERY       A IV Location/Drains/Wounds Patient Lines/Drains/Airways Status     Active Line/Drains/Airways     Name Placement date Placement time Site Days   Peripheral IV 06/24/23 20 G Posterior;Right Forearm 06/24/23  0619  Forearm  15   Peripheral IV 07/09/23 18 G Right Antecubital 07/09/23  1103  Antecubital  less than 1   Closed System Drain 1 Inferior 16 Fr. 08/05/21  1335  --  703   Suprapubic Catheter Latex 14 Fr. 08/23/21  1515  Latex  685   Pressure Injury 06/23/23 Coccyx Stage  1 -  Intact skin with non-blanchable redness of a localized area usually over a bony prominence. RED COCCYX 06/23/23  1746  -- 16   Wound / Incision (Open or Dehisced) 03/19/21 Non-pressure wound Lumbar Lateral;Right Was a site that was lanced previously per patient's family, had a small bandage to site, with yellow drainage. 03/19/21  0922  Lumbar  842            Intake/Output Last 24 hours No intake or output data in the 24 hours ending 07/09/23 1521  Labs/Imaging Results for orders placed or performed during the hospital encounter of 07/09/23 (from the past 48 hours)  CBG monitoring, ED     Status: Abnormal   Collection Time: 07/09/23 11:12 AM  Result Value Ref Range   Glucose-Capillary 229 (H) 70 - 99 mg/dL    Comment: Glucose reference range applies only to samples taken after fasting for at least 8 hours.  Urinalysis, w/ Reflex to Culture (Infection Suspected) -Urine, Clean Catch     Status: Abnormal   Collection Time: 07/09/23 12:52 PM  Result Value Ref Range   Specimen Source URINE, CLEAN CATCH    Color, Urine AMBER (A) YELLOW    Comment: BIOCHEMICALS MAY BE AFFECTED BY COLOR   APPearance CLOUDY (A) CLEAR   Specific Gravity, Urine 1.018 1.005 - 1.030   pH 5.0 5.0 - 8.0   Glucose, UA NEGATIVE NEGATIVE mg/dL  Hgb urine dipstick SMALL (A) NEGATIVE   Bilirubin Urine NEGATIVE NEGATIVE   Ketones, ur NEGATIVE NEGATIVE mg/dL   Protein, ur 30 (A) NEGATIVE mg/dL   Nitrite NEGATIVE NEGATIVE   Leukocytes,Ua MODERATE (A) NEGATIVE   RBC / HPF 21-50 0 - 5 RBC/hpf   WBC, UA >50 0 - 5 WBC/hpf    Comment:        Reflex urine culture not performed if WBC <=10, OR if Squamous epithelial cells >5. If Squamous epithelial cells >5 suggest recollection.    Bacteria, UA RARE (A) NONE SEEN   Squamous Epithelial / HPF 0-5 0 - 5 /HPF   WBC Clumps PRESENT    Mucus PRESENT    Budding Yeast PRESENT     Comment: Performed at [facility], [address] [place], [place] [address]  Resp  panel by RT-PCR (RSV, Flu A&B, Covid) Urine, Clean Catch     Status: None   Collection Time: 07/09/23 12:52 PM   Specimen: Urine, Clean Catch; Nasal Swab  Result Value Ref Range   SARS Coronavirus 2 by RT PCR NEGATIVE NEGATIVE    Comment: (NOTE) SARS-CoV-2 target nucleic acids are NOT DETECTED.  The SARS-CoV-2 RNA is generally detectable in upper respiratory specimens during the acute phase of infection. The lowest concentration of SARS-CoV-2 viral copies this assay can detect is 138 copies/mL. A negative result does not preclude SARS-Cov-2 infection and should not be used as the sole basis for treatment or other patient management decisions. A negative result may occur with  improper specimen collection/handling, submission of specimen other than nasopharyngeal swab, presence of viral mutation(s) within the areas targeted by this assay, and inadequate number of viral copies(<138 copies/mL). A negative result must be combined with clinical observations, patient history, and epidemiological information. The expected result is Negative.  Fact Sheet for Patients:  BloggerCourse.com  Fact Sheet for Healthcare Providers:  SeriousBroker.it  This test is no t yet approved or cleared by the [place]  FDA and  has been authorized for detection and/or diagnosis of SARS-CoV-2 by FDA under an Emergency Use Authorization (EUA). This EUA will remain  in effect (meaning this test can be used) for the duration of the COVID-19 declaration under Section 564(b)(1) of the Act, 21 U.S.C.section 360bbb-3(b)(1), unless the authorization is terminated  or revoked sooner.       Influenza A by PCR NEGATIVE NEGATIVE   Influenza B by PCR NEGATIVE NEGATIVE    Comment: (NOTE) The Xpert Xpress SARS-CoV-2/FLU/RSV plus assay is intended as an aid in the diagnosis of influenza from Nasopharyngeal swab specimens and should not be used as a sole basis for  treatment. Nasal washings and aspirates are unacceptable for Xpert Xpress SARS-CoV-2/FLU/RSV testing.  Fact Sheet for Patients: BloggerCourse.com  Fact Sheet for Healthcare Providers: SeriousBroker.it  This test is not yet approved or cleared by the [place]  FDA and has been authorized for detection and/or diagnosis of SARS-CoV-2 by FDA under an Emergency Use Authorization (EUA). This EUA will remain in effect (meaning this test can be used) for the duration of the COVID-19 declaration under Section 564(b)(1) of the Act, 21 U.S.C. section 360bbb-3(b)(1), unless the authorization is terminated or revoked.     Resp Syncytial Virus by PCR NEGATIVE NEGATIVE    Comment: (NOTE) Fact Sheet for Patients: BloggerCourse.com  Fact Sheet for Healthcare Providers: SeriousBroker.it  This test is not yet approved or cleared by the [place]  FDA and has been authorized for detection and/or diagnosis of SARS-CoV-2 by  FDA under an Emergency Use Authorization (EUA). This EUA will remain in effect (meaning this test can be used) for the duration of the COVID-19 declaration under Section 564(b)(1) of the Act, 21 U.S.C. section 360bbb-3(b)(1), unless the authorization is terminated or revoked.  Performed at [facility], [address], [place], [place] [address]   Lactic acid, plasma     Status: Abnormal   Collection Time: 07/09/23  1:14 PM  Result Value Ref Range   Lactic Acid, Venous 2.3 (HH) 0.5 - 1.9 mmol/L    Comment: CRITICAL RESULT CALLED TO, READ BACK BY AND VERIFIED WITH  ABBY @ 1408 ON [id number] BY HENDERSON L Performed at [name], [address] [place], [place] [address]   Comprehensive metabolic panel     Status: Abnormal   Collection Time: 07/09/23  1:14 PM  Result Value Ref Range   Sodium 124 (L) 135 - 145 mmol/L   Potassium 4.5 3.5 - 5.1 mmol/L   Chloride 94 (L) 98 -  111 mmol/L   CO2 19 (L) 22 - 32 mmol/L   Glucose, Bld 228 (H) 70 - 99 mg/dL    Comment: Glucose reference range applies only to samples taken after fasting for at least 8 hours.   BUN 36 (H) 8 - 23 mg/dL   Creatinine, Ser 1.47 (H) 0.61 - 1.24 mg/dL   Calcium 7.9 (L) 8.9 - 10.3 mg/dL   Total Protein 7.9 6.5 - 8.1 g/dL   Albumin 1.7 (L) 3.5 - 5.0 g/dL   AST 26 15 - 41 U/L   ALT 20 0 - 44 U/L   Alkaline Phosphatase 105 38 - 126 U/L   Total Bilirubin 0.9 0.0 - 1.2 mg/dL   GFR, Estimated 34 (L) >60 mL/min    Comment: (NOTE) Calculated using the CKD-EPI Creatinine Equation (2021)    Anion gap 11 5 - 15    Comment: Performed at [facility], [address] [place], [place] [address]  CBC with Differential     Status: Abnormal   Collection Time: 07/09/23  1:14 PM  Result Value Ref Range   WBC 10.9 (H) 4.0 - 10.5 K/uL   RBC 2.74 (L) 4.22 - 5.81 MIL/uL   Hemoglobin 7.6 (L) 13.0 - 17.0 g/dL   HCT 21.3 (L) 08.6 - 57.8 %   MCV 86.9 80.0 - 100.0 fL   MCH 27.7 26.0 - 34.0 pg   MCHC 31.9 30.0 - 36.0 g/dL   RDW 46.9 (H) 62.9 - 52.8 %   Platelets 412 (H) 150 - 400 K/uL   nRBC 0.0 0.0 - 0.2 %   Neutrophils Relative % 86 %   Neutro Abs 9.3 (H) 1.7 - 7.7 K/uL   Lymphocytes Relative 9 %   Lymphs Abs 1.0 0.7 - 4.0 K/uL   Monocytes Relative 4 %   Monocytes Absolute 0.5 0.1 - 1.0 K/uL   Eosinophils Relative 0 %   Eosinophils Absolute 0.0 0.0 - 0.5 K/uL   Basophils Relative 0 %   Basophils Absolute 0.0 0.0 - 0.1 K/uL   Immature Granulocytes 1 %   Abs Immature Granulocytes 0.05 0.00 - 0.07 K/uL    Comment: Performed at [facility], [address] [place], [place] [address]  Blood Culture (routine x 2)     Status: None (Preliminary result)   Collection Time: 07/09/23  1:14 PM   Specimen: Right Antecubital; Blood  Result Value Ref Range   Specimen Description      RIGHT ANTECUBITAL BOTTLES DRAWN  AEROBIC AND ANAEROBIC   Special Requests      Blood Culture adequate volume Performed at [facility], [address], [place], [place] [address]    Culture PENDING    Report Status PENDING   Blood Culture (routine x 2)     Status: None (Preliminary result)   Collection Time: 07/09/23  1:14 PM   Specimen: BLOOD RIGHT HAND  Result Value Ref Range   Specimen Description      BLOOD RIGHT HAND BOTTLES DRAWN AEROBIC AND ANAEROBIC   Special Requests      Blood Culture adequate volume Performed at [facility], [address], [place], [place] [address]    Culture PENDING    Report Status PENDING   Brain natriuretic peptide     Status: None   Collection Time: 07/09/23  1:14 PM  Result Value Ref Range   B Natriuretic Peptide 40.0 0.0 - 100.0 pg/mL    Comment: Performed at [facility], [address] [place], [place] [address]  CK     Status: None   Collection Time: 07/09/23  1:14 PM  Result Value Ref Range   Total CK 113 49 - 397 U/L    Comment: Performed at [facility], [address] [place], [place] [address]  Lactic acid, plasma     Status: Abnormal   Collection Time: 07/09/23  2:02 PM  Result Value Ref Range   Lactic Acid, Venous 2.4 (HH) 0.5 - 1.9 mmol/L    Comment: CRITICAL RESULT CALLED TO, READ BACK BY AND VERIFIED WITH JOYCE @ 1511 ON [id number] BY HENDERSON L Performed at [facility], [address], [place], [place] [address]    CT CHEST ABDOMEN PELVIS WO CONTRAST Result Date: 07/09/2023 CLINICAL DATA:  Abdominal pain, altered mental status. EXAM: CT CHEST, ABDOMEN AND PELVIS WITHOUT CONTRAST TECHNIQUE: Multidetector CT imaging of the chest, abdomen and pelvis was performed following the standard protocol without IV contrast. RADIATION DOSE REDUCTION: This exam was performed according to the departmental dose-optimization program which includes automated exposure control, adjustment of the mA and/or kV according to patient size and/or use of iterative reconstruction technique. COMPARISON:  Jun 23, 2023. FINDINGS: CT CHEST FINDINGS Cardiovascular: No evidence of thoracic  aortic aneurysm. Coronary calcifications are noted. Normal cardiac size. No pericardial effusion. Mediastinum/Nodes: No enlarged mediastinal, hilar, or axillary lymph nodes. Thyroid  gland, trachea, and esophagus demonstrate no significant findings. Lungs/Pleura: Lungs are clear. No pleural effusion or pneumothorax. Musculoskeletal: No chest wall mass or suspicious bone lesions identified. CT ABDOMEN PELVIS FINDINGS Hepatobiliary: No focal liver abnormality is seen. No gallstones, gallbladder wall thickening, or biliary dilatation. Pancreas: Unremarkable. No pancreatic ductal dilatation or surrounding inflammatory changes. Spleen: Moderate splenomegaly. Adrenals/Urinary Tract: Adrenal glands and kidneys appear normal. No hydronephrosis or renal obstruction is noted. Suprapubic bladder catheter is again noted. Stomach/Bowel: The stomach is unremarkable. The appendix appears normal. Large amount of stool seen throughout the colon. There is no evidence of bowel obstruction. Vascular/Lymphatic: No significant vascular findings are present. No enlarged abdominal or pelvic lymph nodes. Reproductive: Prostate is unremarkable. Other: No ascites is noted. Small fat containing periumbilical hernia is noted. Musculoskeletal: Stable extensive bony destruction of right femoral head with superior subluxation of the right femur relative to acetabulum. Stable rounded prominence of the medial musculature of the right hip which may represent joint effusion. IMPRESSION: Moderate splenomegaly. Large amount of stool seen throughout the colon suggesting constipation. Stable extensive bony destruction of right femoral head is noted with superior subluxation of  the right femoral head relative to acetabulum, with stable prominence of the medial musculature and soft tissues of the right hip which may represent joint effusion related to the hip. 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 Coronary artery  calcifications are noted suggesting coronary artery disease. Electronically Signed   By: Rosalene Colon M.D.   On: 07/09/2023 14:44   CT Head Wo Contrast Result Date: 07/09/2023 CLINICAL DATA:  Provided history: Altered mental status. EXAM: CT HEAD WITHOUT CONTRAST TECHNIQUE: Contiguous axial images were obtained from the base of the skull through the vertex without intravenous contrast. RADIATION DOSE REDUCTION: This exam was performed according to the departmental dose-optimization program which includes automated exposure control, adjustment of the mA and/or kV according to patient size and/or use of iterative reconstruction technique. COMPARISON:  Head CT 03/17/2021. FINDINGS: Brain: Generalized cerebral atrophy. Patchy and ill-defined hypoattenuation within the cerebral white matter, nonspecific but compatible with mild chronic small vessel ischemic disease. There is no acute intracranial hemorrhage. No demarcated cortical infarct. No extra-axial fluid collection. No evidence of an intracranial mass. No midline shift. Vascular: No hyperdense vessel.  Atherosclerotic calcifications. Skull: No calvarial fracture or aggressive osseous lesion. Sinuses/Orbits: No mass or acute finding within the imaged orbits. No significant paranasal sinus disease at the imaged levels. IMPRESSION: 1. No acute intracranial finding. 2. Parenchymal atrophy and chronic small vessel ischemic disease. Electronically Signed   By: Bascom Lily D.O.   On: 07/09/2023 14:32   DG Chest Port 1 View Result Date: 07/09/2023 CLINICAL DATA:  68 year old male with possible sepsis. Confusion. Extremity swelling. EXAM: PORTABLE CHEST 1 VIEW COMPARISON:  Portable chest 05/18/2022 and earlier. FINDINGS: Portable AP view at 1231 hours. Lordotic positioning. Lung volumes, mediastinal contours within normal limits. Visualized tracheal air column is within normal limits. No pneumothorax, pulmonary edema, pleural effusion, consolidation. But there is  streaky bilateral lower lung, bilateral lower peribronchial opacity suspected. Paucity of bowel gas in the upper abdomen. Chronic severe left shoulder degeneration. No acute osseous abnormality identified. IMPRESSION: Suspicion of bilateral lower lung opacity on this lordotic portable exam. Consider bilateral lower lobe Bronchopneumonia. No definite effusion. PA and lateral views would be helpful when feasible. Electronically Signed   By: Marlise Simpers M.D.   On: 07/09/2023 12:40    Pending Labs Unresulted Labs (From admission, onward)     Start     Ordered   07/09/23 1516  Occult blood card to lab, stool  Once,   R        07/09/23 1515   07/09/23 1516  Vitamin B12  (Anemia Panel (PNL))  Add-on,   AD        07/09/23 1515   07/09/23 1516  Folate  (Anemia Panel (PNL))  Add-on,   AD        07/09/23 1515   07/09/23 1516  Iron and TIBC  (Anemia Panel (PNL))  Add-on,   AD        07/09/23 1515   07/09/23 1516  Ferritin  (Anemia Panel (PNL))  Add-on,   AD        07/09/23 1515   07/09/23 1516  Reticulocytes  (Anemia Panel (PNL))  Add-on,   AD        07/09/23 1515   07/09/23 1252  Urine Culture  Once,   R        07/09/23 1252            Vitals/Pain Today's Vitals   07/09/23 1112  07/09/23 1146 07/09/23 1200 07/09/23 1500  BP: (!) 97/47  106/62 122/62  Pulse: (!) 113  (!) 104 (!) 107  Resp: 16  13 14   Temp:  (!) 97.4 F (36.3 C)    TempSrc:  Oral    SpO2: 97%  98% 98%  Weight:      Height:        Isolation Precautions No active isolations  Medications Medications  sodium phosphate (FLEET) enema 1 enema (has no administration in time range)  lactated ringers bolus 1,000 mL (1,000 mLs Intravenous New Bag/Given 07/09/23 1445)  albumin human 25 % solution 25 g (25 g Intravenous New Bag/Given 07/09/23 1444)    Mobility walks with device     Focused Assessments Renal Assessment Handoff:   R Recommendations: See Admitting Provider Note  Report given to:   Additional Notes:

## 2023-07-09 NOTE — ED Provider Notes (Incomplete)
 [place] EMERGENCY DEPARTMENT AT [facility] Provider Note   CSN: [id number] Arrival date & time: 07/09/23  1054     History {Add pertinent medical, surgical, social history, OB history to HPI:1} Chief Complaint  Patient presents with   Weakness    [name] is a 68 y.o. male.  68 year old male with a history of dementia, diabetes, and suprapubic catheter who presents to the emergency department with swelling and weakness.  History obtained per EMS and the patient's sister.  He had decreased urination yesterday. Legs and left hand are swollen. On antibiotic yesterday for UTI.  Had his suprapubic catheter changed on 5/8 by urology.  Also has had some confusion and increased constipation recently as well.       Home Medications Prior to Admission medications   Medication Sig Start Date End Date Taking? Authorizing Provider  benztropine  (COGENTIN ) 2 MG tablet Take 1 tablet (2 mg total) by mouth at bedtime. Patient taking differently: Take 2 mg by mouth 2 (two) times daily. 09/28/12   Ferrel Hsu, NP  donepezil (ARICEPT) 5 MG tablet Take 5 mg by mouth daily. 04/17/21   [provider]  levothyroxine  (SYNTHROID , LEVOTHROID) 75 MCG tablet Take 1 tablet (75 mcg total) by mouth daily. 09/28/12   Ferrel Hsu, NP  meloxicam (MOBIC) 15 MG tablet Take 15 mg by mouth daily. 04/10/21   [provider]  metFORMIN (GLUCOPHAGE-XR) 500 MG 24 hr tablet Take 500 mg by mouth daily. 03/13/21   [provider]  omeprazole  (PRILOSEC) 20 MG capsule Take 1 capsule (20 mg total) by mouth daily. 09/28/12   Ferrel Hsu, NP  paliperidone (INVEGA SUSTENNA) 234 MG/1.5ML SUSY injection Inject 1 mL into the muscle every 30 (thirty) days. 01/23/21   [provider]  paliperidone (INVEGA) 3 MG 24 hr tablet Take 1 tablet by mouth 2 (two) times daily. 03/13/21   [provider]  polyethylene glycol (MIRALAX  / GLYCOLAX ) 17 g packet Take 17 g by mouth 2 (two)  times daily. Patient taking differently: Take 17 g by mouth 2 (two) times daily as needed for mild constipation. 03/20/21   Graciela Lava, MD  QUEtiapine (SEROQUEL) 200 MG tablet Take 600 mg by mouth at bedtime. 03/13/21   [provider]  rosuvastatin (CRESTOR) 5 MG tablet Take 5 mg by mouth once a week. Take 5 mg by mouth every week on Friday 03/13/21   [provider]      Allergies    Patient has no known allergies.    Review of Systems   Review of Systems  Physical Exam Updated Vital Signs BP (!) 97/47   Pulse (!) 113   Temp (!) 97.4 F (36.3 C) (Oral)   Resp 16   Ht 5\' 10"  (1.778 m)   Wt 87 kg   SpO2 97%   BMI 27.52 kg/m  Physical Exam Vitals and nursing note reviewed.  Constitutional:      General: He is not in acute distress.    Appearance: He is well-developed.     Comments: Oriented to self only  HENT:     Head: Normocephalic and atraumatic.     Right Ear: External ear normal.     Left Ear: External ear normal.     Nose: Nose normal.     Mouth/Throat:     Mouth: Mucous membranes are dry.  Eyes:     Extraocular Movements: Extraocular movements intact.     Conjunctiva/sclera: Conjunctivae  normal.     Pupils: Pupils are equal, round, and reactive to light.  Cardiovascular:     Rate and Rhythm: Regular rhythm. Tachycardia present.     Heart sounds: Normal heart sounds.  Pulmonary:     Effort: Pulmonary effort is normal. No respiratory distress.     Breath sounds: Normal breath sounds.  Abdominal:     General: There is distension.     Palpations: Abdomen is soft. There is no mass.     Tenderness: There is no abdominal tenderness. There is no guarding.  Musculoskeletal:     Cervical back: Normal range of motion and neck supple.     Right lower leg: Edema present.     Left lower leg: Edema present.  Skin:    General: Skin is warm and dry.  Neurological:     Mental Status: He is alert. Mental status is at baseline.     Comments: Patient  having difficulty complying with exam due to his dementia.  No gross cranial nerve deficits.  Globally weak with all extremities 4/5 strength.  Intact sensation to light touch.     ED Results / Procedures / Treatments   Labs (all labs ordered are listed, but only abnormal results are displayed) Labs Reviewed  CBG MONITORING, ED - Abnormal; Notable for the following components:      Result Value   Glucose-Capillary 229 (*)    All other components within normal limits    EKG EKG Interpretation Date/Time:  Saturday Jul 09 2023 11:11:19 EDT Ventricular Rate:  113 PR Interval:  147 QRS Duration:  91 QT Interval:  317 QTC Calculation: 435 R Axis:   39  Text Interpretation: Sinus tachycardia Borderline T abnormalities, inferior leads Confirmed by Shyrl Doyne [phone]) on 07/09/2023 12:05:52 PM  Radiology No results found.  Procedures Procedures  {Document cardiac monitor, telemetry assessment procedure when appropriate:1}  Medications Ordered in ED Medications - No data to display  ED Course/ Medical Decision Making/ A&P   {   Click here for ABCD2, HEART and other calculatorsREFRESH Note before signing :1}                              Medical Decision Making Amount and/or Complexity of Data Reviewed Labs: ordered. Radiology: ordered.   ***  {Document critical care time when appropriate:1} {Document review of labs and clinical decision tools ie heart score, Chads2Vasc2 etc:1}  {Document your independent review of radiology images, and any outside records:1} {Document your discussion with family members, caretakers, and with consultants:1} {Document social determinants of health affecting pt's care:1} {Document your decision making why or why not admission, treatments were needed:1} Final Clinical Impression(s) / ED Diagnoses Final diagnoses:  None    Rx / DC Orders ED Discharge Orders     None

## 2023-07-10 DIAGNOSIS — N179 Acute kidney failure, unspecified: Secondary | ICD-10-CM | POA: Diagnosis not present

## 2023-07-10 LAB — BLOOD CULTURE ID PANEL (REFLEXED) - BCID2

## 2023-07-10 LAB — GLUCOSE, CAPILLARY
Glucose-Capillary: 158 mg/dL — ABNORMAL HIGH (ref 70–99)
Glucose-Capillary: 172 mg/dL — ABNORMAL HIGH (ref 70–99)
Glucose-Capillary: 173 mg/dL — ABNORMAL HIGH (ref 70–99)
Glucose-Capillary: 221 mg/dL — ABNORMAL HIGH (ref 70–99)

## 2023-07-10 LAB — CBC
HCT: 21.6 % — ABNORMAL LOW (ref 39.0–52.0)
Hemoglobin: 7 g/dL — ABNORMAL LOW (ref 13.0–17.0)
MCH: 27.8 pg (ref 26.0–34.0)
MCHC: 32.4 g/dL (ref 30.0–36.0)
MCV: 85.7 fL (ref 80.0–100.0)
Platelets: 387 10*3/uL (ref 150–400)
RBC: 2.52 MIL/uL — ABNORMAL LOW (ref 4.22–5.81)
RDW: 15.7 % — ABNORMAL HIGH (ref 11.5–15.5)
WBC: 8.9 10*3/uL (ref 4.0–10.5)
nRBC: 0 % (ref 0.0–0.2)

## 2023-07-10 LAB — BASIC METABOLIC PANEL WITH GFR
Anion gap: 6 (ref 5–15)
BUN: 28 mg/dL — ABNORMAL HIGH (ref 8–23)
CO2: 21 mmol/L — ABNORMAL LOW (ref 22–32)
Calcium: 7.7 mg/dL — ABNORMAL LOW (ref 8.9–10.3)
Chloride: 101 mmol/L (ref 98–111)
Creatinine, Ser: 1.57 mg/dL — ABNORMAL HIGH (ref 0.61–1.24)
GFR, Estimated: 48 mL/min — ABNORMAL LOW (ref >60)
Glucose, Bld: 147 mg/dL — ABNORMAL HIGH (ref 70–99)
Potassium: 4.2 mmol/L (ref 3.5–5.1)
Sodium: 128 mmol/L — ABNORMAL LOW (ref 135–145)

## 2023-07-10 LAB — URINE CULTURE

## 2023-07-10 MED ORDER — SODIUM CHLORIDE 0.9 % IV SOLN: INTRAVENOUS | Status: AC

## 2023-07-10 NOTE — Plan of Care (Signed)

## 2023-07-10 NOTE — Plan of Care (Signed)
  Problem: Clinical Measurements: Goal: Ability to maintain clinical measurements within normal limits will improve Outcome: Progressing   Problem: Nutrition: Goal: Adequate nutrition will be maintained Outcome: Progressing   

## 2023-07-10 NOTE — Progress Notes (Signed)
 PROGRESS NOTE  [name], is a 68 y.o. male, DOB - Oct 10, 1955, ZOX:[id number]  Admit date - 07/09/2023   Admitting Physician Malka Sea, DO  Outpatient Primary MD for the patient is [name], MD  LOS - 1  Chief Complaint  Patient presents with   Weakness     Brief Narrative:  68 y.o. male with medical history significant of dementia, schizophrenia, hypertension, hypothyroidism, diabetes admitted on 07/09/2023 with AKI, dehydration with Hyponatremia failure to thrive possible UTI    -Assessment and Plan: 1)AKI----acute kidney injury  -  creatinine on admission= 2.09  ,  baseline creatinine = 1.1 on 06/25/2023    ,  creatinine is now= trending down with hydration, -- renally adjust medications, avoid nephrotoxic agents / dehydration  / hypotension  2) possible UTI--- continue Rocephin  pending culture data, patient has a pubic catheter  3)DM2-A1C 7.0 -Continue Semglee 10 units nightly Use Novolog /Humalog Sliding scale insulin  with Accu-Cheks/Fingersticks as ordered   4)Acute on chronic anemia--baseline hemoglobin usually around 8, admission globin 7.6 down to 7.0 after hydration due to hemodilution -- Monitor closely  5) hyponatremia--up to 128 from 124 after IV fluids  Hypothyroidism--- continue levothyroxine   GERD--continue Protonix   HLD--continue Crestor  Paranoid schizophrenia/dementia --- stable, continue Cogentin  along with Seroquel and Invega  Ambulatory dysfunction/generalized weakness--get PT eval  Status is: Inpatient   Disposition: The patient is from: Home              Anticipated d/c is to: Home              Anticipated d/c date is: 2 days              Patient currently is not medically stable to d/c. Barriers: Not Clinically Stable-   Code Status :  -  Code Status: Full Code   Family Communication:    (patient is alert, awake and coherent)  Nephew-- [name] [phone]  DVT Prophylaxis  :   - SCDs  enoxaparin  (LOVENOX ) injection 40  mg Start: 07/09/23 2200   Lab Results  Component Value Date   PLT 387 07/10/2023   Inpatient Medications  Scheduled Meds:  benztropine   2 mg Oral BID   enoxaparin  (LOVENOX ) injection  40 mg Subcutaneous Q24H   insulin  aspart  0-15 Units Subcutaneous TID WC   insulin  aspart  0-5 Units Subcutaneous QHS   insulin  glargine-yfgn  10 Units Subcutaneous QHS   levothyroxine   75 mcg Oral Q0600   paliperidone  3 mg Oral BID   pantoprazole   40 mg Oral Daily   polyethylene glycol  17 g Oral BID   Ensure Max Protein  11 oz Oral BID BM   QUEtiapine  600 mg Oral QHS   [START ON 07/15/2023] rosuvastatin  5 mg Oral Weekly   Continuous Infusions:  cefTRIAXone  (ROCEPHIN )  IV 1 g (07/10/23 1717)   PRN Meds:.acetaminophen  **OR** acetaminophen    Anti-infectives (From admission, onward)    Start     Dose/Rate Route Frequency Ordered Stop   07/09/23 1600  cefTRIAXone  (ROCEPHIN ) 1 g in sodium chloride  0.9 % 100 mL IVPB        1 g 200 mL/hr over 30 Minutes Intravenous Every 24 hours 07/09/23 1551          Subjective: [name] today has no fevers, no emesis,  No chest pain,   - Patient's nephew [name] is primary contact -Patient with fatigue, generalized weakness and ambulatory dysfunction   Objective: Vitals:  07/09/23 1627 07/09/23 2022 07/10/23 0428 07/10/23 1322  BP: 125/69 (!) 113/53 (!) 119/59 (!) 121/58  Pulse: (!) 110 (!) 109 (!) 110 (!) 109  Resp: 20 18 18 17   Temp:  98.8 F (37.1 C) 98.2 F (36.8 C) 98.1 F (36.7 C)  TempSrc:    Oral  SpO2: 97% 96% 93% 100%  Weight:      Height:        Intake/Output Summary (Last 24 hours) at 07/10/2023 1819 Last data filed at 07/10/2023 1728 Gross per 24 hour  Intake 2583.89 ml  Output 3100 ml  Net -516.11 ml   Filed Weights   07/09/23 1107  Weight: 87 kg    Physical Exam  Gen:- Awake Alert, in no acute distress HEENT:- [place].AT, No sclera icterus Neck-Supple Neck,No JVD,.  Lungs-  CTAB , fair symmetrical air  movement CV- S1, S2 normal, regular  Abd-  +ve B.Sounds, Abd Soft, No tenderness, suprapubic catheter in situ Extremity/Skin:- No  edema, pedal pulses present  Psych-affect is appropriate, oriented x3 Neuro-generalized weakness and deconditioning , no new focal deficits, no tremors  Data Reviewed: I have personally reviewed following labs and imaging studies  CBC: Recent Labs  Lab 07/09/23 1314 07/10/23 0342  WBC 10.9* 8.9  NEUTROABS 9.3*  --   HGB 7.6* 7.0*  HCT 23.8* 21.6*  MCV 86.9 85.7  PLT 412* 387   Basic Metabolic Panel: Recent Labs  Lab 07/09/23 1314 07/10/23 0342  NA 124* 128*  K 4.5 4.2  CL 94* 101  CO2 19* 21*  GLUCOSE 228* 147*  BUN 36* 28*  CREATININE 2.09* 1.57*  CALCIUM 7.9* 7.7*   GFR: Estimated Creatinine Clearance: 47.1 mL/min (A) (by C-G formula based on SCr of 1.57 mg/dL (H)). Liver Function Tests: Recent Labs  Lab 07/09/23 1314  AST 26  ALT 20  ALKPHOS 105  BILITOT 0.9  PROT 7.9  ALBUMIN 1.7*   Cardiac Enzymes: Recent Labs  Lab 07/09/23 1314  CKTOTAL 113   BNP (last 3 results) No results for input(s): "PROBNP" in the last 8760 hours. HbA1C: No results for input(s): "HGBA1C" in the last 72 hours. Sepsis Labs: @LABRCNTIP (procalcitonin:4,lacticidven:4) ) Recent Results (from the past 240 hours)  Resp panel by RT-PCR (RSV, Flu A&B, Covid) Urine, Clean Catch     Status: None   Collection Time: 07/09/23 12:52 PM   Specimen: Urine, Clean Catch; Nasal Swab  Result Value Ref Range Status   SARS Coronavirus 2 by RT PCR NEGATIVE NEGATIVE Final    Comment: (NOTE) SARS-CoV-2 target nucleic acids are NOT DETECTED.  The SARS-CoV-2 RNA is generally detectable in upper respiratory specimens during the acute phase of infection. The lowest concentration of SARS-CoV-2 viral copies this assay can detect is 138 copies/mL. A negative result does not preclude SARS-Cov-2 infection and should not be used as the sole basis for treatment or other  patient management decisions. A negative result may occur with  improper specimen collection/handling, submission of specimen other than nasopharyngeal swab, presence of viral mutation(s) within the areas targeted by this assay, and inadequate number of viral copies(<138 copies/mL). A negative result must be combined with clinical observations, patient history, and epidemiological information. The expected result is Negative.  Fact Sheet for Patients:  BloggerCourse.com  Fact Sheet for Healthcare Providers:  SeriousBroker.it  This test is no t yet approved or cleared by the [place]  FDA and  has been authorized for detection and/or diagnosis of SARS-CoV-2 by FDA under an Emergency Use Authorization (  EUA). This EUA will remain  in effect (meaning this test can be used) for the duration of the COVID-19 declaration under Section 564(b)(1) of the Act, 21 U.S.C.section 360bbb-3(b)(1), unless the authorization is terminated  or revoked sooner.       Influenza A by PCR NEGATIVE NEGATIVE Final   Influenza B by PCR NEGATIVE NEGATIVE Final    Comment: (NOTE) The Xpert Xpress SARS-CoV-2/FLU/RSV plus assay is intended as an aid in the diagnosis of influenza from Nasopharyngeal swab specimens and should not be used as a sole basis for treatment. Nasal washings and aspirates are unacceptable for Xpert Xpress SARS-CoV-2/FLU/RSV testing.  Fact Sheet for Patients: BloggerCourse.com  Fact Sheet for Healthcare Providers: SeriousBroker.it  This test is not yet approved or cleared by the [place]  FDA and has been authorized for detection and/or diagnosis of SARS-CoV-2 by FDA under an Emergency Use Authorization (EUA). This EUA will remain in effect (meaning this test can be used) for the duration of the COVID-19 declaration under Section 564(b)(1) of the Act, 21 U.S.C. section  360bbb-3(b)(1), unless the authorization is terminated or revoked.     Resp Syncytial Virus by PCR NEGATIVE NEGATIVE Final    Comment: (NOTE) Fact Sheet for Patients: BloggerCourse.com  Fact Sheet for Healthcare Providers: SeriousBroker.it  This test is not yet approved or cleared by the [place]  FDA and has been authorized for detection and/or diagnosis of SARS-CoV-2 by FDA under an Emergency Use Authorization (EUA). This EUA will remain in effect (meaning this test can be used) for the duration of the COVID-19 declaration under Section 564(b)(1) of the Act, 21 U.S.C. section 360bbb-3(b)(1), unless the authorization is terminated or revoked.  Performed at [facility], [address], [place], [place] [address]   Urine Culture     Status: Abnormal   Collection Time: 07/09/23 12:52 PM   Specimen: Urine, Random  Result Value Ref Range Status   Specimen Description   Final    URINE, RANDOM Performed at [facility], [address], [place], [place] [address]    Special Requests   Final    NONE Reflexed from [id number] Performed at [facility], [address], [place], [place] [address]    Culture MULTIPLE SPECIES PRESENT, SUGGEST RECOLLECTION (A)  Final   Report Status 07/10/2023 FINAL  Final  Blood Culture (routine x 2)     Status: None (Preliminary result)   Collection Time: 07/09/23  1:14 PM   Specimen: Right Antecubital; Blood  Result Value Ref Range Status   Specimen Description   Final    RIGHT ANTECUBITAL BOTTLES DRAWN AEROBIC AND ANAEROBIC   Special Requests Blood Culture adequate volume  Final   Culture   Final    NO GROWTH < 24 HOURS Performed at [facility], [address] [place], [place] [address]    Report Status PENDING  Incomplete  Blood Culture (routine x 2)     Status: None (Preliminary result)   Collection Time: 07/09/23  1:14 PM   Specimen: BLOOD RIGHT HAND  Result Value Ref Range Status   Specimen  Description   Final    BLOOD RIGHT HAND BOTTLES DRAWN AEROBIC AND ANAEROBIC   Special Requests Blood Culture adequate volume  Final   Culture  Setup Time   Final    GRAM POSITIVE COCCI ANAEROBIC BOTTLE ONLY Gram Stain Report Called to,Read Back By and Verified With: T LENARD AT 1350 ON [id number] BY S DALTON Performed at [facility], [address], [place], [place]  [address]    Culture GRAM POSITIVE COCCI  Final   Report Status PENDING  Incomplete      Radiology Studies: CT CHEST ABDOMEN PELVIS WO CONTRAST Result Date: 07/09/2023 CLINICAL DATA:  Abdominal pain, altered mental status. EXAM: CT CHEST, ABDOMEN AND PELVIS WITHOUT CONTRAST TECHNIQUE: Multidetector CT imaging of the chest, abdomen and pelvis was performed following the standard protocol without IV contrast. RADIATION DOSE REDUCTION: This exam was performed according to the departmental dose-optimization program which includes automated exposure control, adjustment of the mA and/or kV according to patient size and/or use of iterative reconstruction technique. COMPARISON:  Jun 23, 2023. FINDINGS: CT CHEST FINDINGS Cardiovascular: No evidence of thoracic aortic aneurysm. Coronary calcifications are noted. Normal cardiac size. No pericardial effusion. Mediastinum/Nodes: No enlarged mediastinal, hilar, or axillary lymph nodes. Thyroid  gland, trachea, and esophagus demonstrate no significant findings. Lungs/Pleura: Lungs are clear. No pleural effusion or pneumothorax. Musculoskeletal: No chest wall mass or suspicious bone lesions identified. CT ABDOMEN PELVIS FINDINGS Hepatobiliary: No focal liver abnormality is seen. No gallstones, gallbladder wall thickening, or biliary dilatation. Pancreas: Unremarkable. No pancreatic ductal dilatation or surrounding inflammatory changes. Spleen: Moderate splenomegaly. Adrenals/Urinary Tract: Adrenal glands and kidneys appear normal. No hydronephrosis or renal obstruction is noted. Suprapubic bladder catheter  is again noted. Stomach/Bowel: The stomach is unremarkable. The appendix appears normal. Large amount of stool seen throughout the colon. There is no evidence of bowel obstruction. Vascular/Lymphatic: No significant vascular findings are present. No enlarged abdominal or pelvic lymph nodes. Reproductive: Prostate is unremarkable. Other: No ascites is noted. Small fat containing periumbilical hernia is noted. Musculoskeletal: Stable extensive bony destruction of right femoral head with superior subluxation of the right femur relative to acetabulum. Stable rounded prominence of the medial musculature of the right hip which may represent joint effusion. IMPRESSION: Moderate splenomegaly. Large amount of stool seen throughout the colon suggesting constipation. Stable extensive bony destruction of right femoral head is noted with superior subluxation of the right femoral head relative to acetabulum, with stable prominence of the medial musculature and soft tissues of the right hip which may represent joint effusion related to the hip. 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 Coronary artery calcifications are noted suggesting coronary artery disease. Electronically Signed   By: Rosalene Colon M.D.   On: 07/09/2023 14:44   CT Head Wo Contrast Result Date: 07/09/2023 CLINICAL DATA:  Provided history: Altered mental status. EXAM: CT HEAD WITHOUT CONTRAST TECHNIQUE: Contiguous axial images were obtained from the base of the skull through the vertex without intravenous contrast. RADIATION DOSE REDUCTION: This exam was performed according to the departmental dose-optimization program which includes automated exposure control, adjustment of the mA and/or kV according to patient size and/or use of iterative reconstruction technique. COMPARISON:  Head CT 03/17/2021. FINDINGS: Brain: Generalized cerebral atrophy. Patchy and ill-defined hypoattenuation within the cerebral white matter, nonspecific  but compatible with mild chronic small vessel ischemic disease. There is no acute intracranial hemorrhage. No demarcated cortical infarct. No extra-axial fluid collection. No evidence of an intracranial mass. No midline shift. Vascular: No hyperdense vessel.  Atherosclerotic calcifications. Skull: No calvarial fracture or aggressive osseous lesion. Sinuses/Orbits: No mass or acute finding within the imaged orbits. No significant paranasal sinus disease at the imaged levels. IMPRESSION: 1. No acute intracranial finding. 2. Parenchymal atrophy and chronic small vessel ischemic disease. Electronically Signed   By: Bascom Lily D.O.   On: 07/09/2023 14:32   DG Chest Port 1 View Result Date: 07/09/2023  CLINICAL DATA:  68 year old male with possible sepsis. Confusion. Extremity swelling. EXAM: PORTABLE CHEST 1 VIEW COMPARISON:  Portable chest 05/18/2022 and earlier. FINDINGS: Portable AP view at 1231 hours. Lordotic positioning. Lung volumes, mediastinal contours within normal limits. Visualized tracheal air column is within normal limits. No pneumothorax, pulmonary edema, pleural effusion, consolidation. But there is streaky bilateral lower lung, bilateral lower peribronchial opacity suspected. Paucity of bowel gas in the upper abdomen. Chronic severe left shoulder degeneration. No acute osseous abnormality identified. IMPRESSION: Suspicion of bilateral lower lung opacity on this lordotic portable exam. Consider bilateral lower lobe Bronchopneumonia. No definite effusion. PA and lateral views would be helpful when feasible. Electronically Signed   By: Marlise Simpers M.D.   On: 07/09/2023 12:40     Scheduled Meds:  benztropine   2 mg Oral BID   enoxaparin  (LOVENOX ) injection  40 mg Subcutaneous Q24H   insulin  aspart  0-15 Units Subcutaneous TID WC   insulin  aspart  0-5 Units Subcutaneous QHS   insulin  glargine-yfgn  10 Units Subcutaneous QHS   levothyroxine   75 mcg Oral Q0600   paliperidone  3 mg Oral BID    pantoprazole   40 mg Oral Daily   polyethylene glycol  17 g Oral BID   Ensure Max Protein  11 oz Oral BID BM   QUEtiapine  600 mg Oral QHS   [START ON 07/15/2023] rosuvastatin  5 mg Oral Weekly   Continuous Infusions:  cefTRIAXone  (ROCEPHIN )  IV 1 g (07/10/23 1717)     LOS: 1 day    Colin Dawley M.D on 07/10/2023 at 6:19 PM  Go to [email or web address] - for contact info  [facility] - Office  [phone]  If 7PM-7AM, please contact night-coverage [email or web address] 07/10/2023, 6:19 PM

## 2023-07-10 NOTE — Progress Notes (Signed)
   07/10/23 1435  TOC Assessment  TOC screening is complete Yes  Once discharged, how will the patient get to their discharge location? Family/Friend - Partnered Transport  Barriers to Discharge Continued Medical Work up  Patient states their goals for this hospitalization and ongoing recovery are: DC Home  CMS Medicare.gov Compare Post Acute Care list provided to: Patient  Choice offered to / list presented to  Sibling  Lives with: Siblings  Share Information with NAME Meredith Stalls  Permission granted to share info w Relationship Sister  Permission granted to share info w Contact Information [phone]  Criminal Activity/Legal Involvement Pertinent to Current Situation/Hospitalization No - Comment as needed  Need for Family Participation in Patient Care Y  Care giver support system in place? Y  Appearance: Appears younger than stated age  Attitude/Demeanor/Rapport Engaged  Affect (typically observed) Accepting  Orientation:  Oriented to Self  Psych Involvement N   Admitted with Acute Kidney Injury. Transition of Care Department [facility]) has reviewed patient, TOC  will continue to monitor patient advancement through interdisciplinary progressions rounds.

## 2023-07-10 NOTE — Progress Notes (Signed)
 Nursing received a phone call from pt's nephew Donavon Fudge to whom the pt lives with, Donavon Fudge informed nursing that pt has not been walking well recently and has been c/o R hip and leg pain, he requested that we ask MD for xray of the R hip. Informed him that we will pass this on but that there is no guarantee if its not clinically indicated. Assessment does reveal some weakness BLE and pain noted, however pt has not been OOB this shift for further assessment. Will inform medical team so they can decide what is appropriate. Pt resting in bed w/ eyes closed, no signs of distress, will monitor

## 2023-07-10 NOTE — TOC Initial Note (Signed)
 Transition of Care [facility]) - Initial/Assessment Note    Patient Details  Name: [name] MRN: [id number] Date of Birth: [birth year]  Transition of Care [facility]) CM/SW Contact:    Lynda Sands, RN Phone Number: 07/10/2023, 2:49 PM  Clinical Narrative:    CM me with patient at bedside. Patient was not able to give a lot of information. Patient from home. Lives with his nephew, and Care aide.                  Barriers to Discharge: Continued Medical Work up   Patient Goals and CMS Choice Patient states their goals for this hospitalization and ongoing recovery are:: DC Home CMS Medicare.gov Compare Post Acute Care list provided to:: Patient Choice offered to / list presented to : Sibling      Expected Discharge Plan and Services Discharge to home . Care aide manages his suprapubic catheter.  Prior Living Arrangements/Services   Lives with:: Siblings          Need for Family Participation in Patient Care: Yes (Comment) Care giver support system in place?: Yes (comment)   Criminal Activity/Legal Involvement Pertinent to Current Situation/Hospitalization: No - Comment as needed  Activities of Daily Living   ADL Screening (condition at time of admission) Independently performs ADLs?: Yes (appropriate for developmental age) Is the patient deaf or have difficulty hearing?: No Does the patient have difficulty seeing, even when wearing glasses/contacts?: No Does the patient have difficulty concentrating, remembering, or making decisions?: No  Permission Sought/Granted      Share Information with NAME: Meredith Stalls     Permission granted to share info w Relationship: Sister  Permission granted to share info w Contact Information: [phone]  Emotional Assessment Appearance:: Appears younger than stated age Attitude/Demeanor/Rapport: Engaged Affect (typically observed): Accepting Orientation: : Oriented to Self   Psych Involvement: No (comment)  Admission diagnosis:  AKI  (acute kidney injury) (HCC) [N17.9] Patient Active Problem List   Diagnosis Date Noted   Failure to thrive in adult 07/09/2023   Metabolic acidosis 07/09/2023   Protein deficiency (HCC) 07/09/2023   Normocytic anemia 07/09/2023   Lactic acidosis 07/09/2023   UTI (urinary tract infection) 06/23/2023   DM (diabetes mellitus) (HCC) 06/23/2023   Dementia (HCC) 11/05/2021   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 - [place], [place] - [address] [place] [address] Phone: [phone] Fax: [phone]     Social Drivers of Health (SDOH) Social History: SDOH Screenings   Food Insecurity: No Food Insecurity (07/09/2023)  Housing: Low Risk  (07/09/2023)  Transportation Needs: No Transportation Needs (07/09/2023)  Utilities: Not At Risk (07/09/2023)  Financial Resource Strain: Medium Risk (05/19/2022)   [facility] from [facility]  Social Connections: Unknown (07/10/2023)  Tobacco Use: Medium Risk (07/09/2023)  Health Literacy: High Risk (02/24/2021)   Received from [facility]   SDOH Interventions:     Readmission Risk Interventions     No data to display

## 2023-07-10 NOTE — Plan of Care (Signed)

## 2023-07-11 DIAGNOSIS — N179 Acute kidney failure, unspecified: Secondary | ICD-10-CM | POA: Diagnosis not present

## 2023-07-11 LAB — GLUCOSE, CAPILLARY
Glucose-Capillary: 147 mg/dL — ABNORMAL HIGH (ref 70–99)
Glucose-Capillary: 132 mg/dL — ABNORMAL HIGH (ref 70–99)
Glucose-Capillary: 96 mg/dL (ref 70–99)
Glucose-Capillary: 242 mg/dL — ABNORMAL HIGH (ref 70–99)

## 2023-07-11 LAB — RENAL FUNCTION PANEL
Albumin: <1.5 g/dL — ABNORMAL LOW (ref 3.5–5.0)
Anion gap: 3 — ABNORMAL LOW (ref 5–15)
BUN: 22 mg/dL (ref 8–23)
CO2: 23 mmol/L (ref 22–32)
Calcium: 7.8 mg/dL — ABNORMAL LOW (ref 8.9–10.3)
Chloride: 105 mmol/L (ref 98–111)
Creatinine, Ser: 1.19 mg/dL (ref 0.61–1.24)
GFR, Estimated: >60 mL/min (ref >60)
Glucose, Bld: 168 mg/dL — ABNORMAL HIGH (ref 70–99)
Phosphorus: 2.5 mg/dL (ref 2.5–4.6)
Potassium: 3.7 mmol/L (ref 3.5–5.1)
Sodium: 131 mmol/L — ABNORMAL LOW (ref 135–145)

## 2023-07-11 LAB — CBC
HCT: 20.9 % — ABNORMAL LOW (ref 39.0–52.0)
Hemoglobin: 6.5 g/dL — CL (ref 13.0–17.0)
MCH: 26.5 pg (ref 26.0–34.0)
MCHC: 31.1 g/dL (ref 30.0–36.0)
MCV: 85.3 fL (ref 80.0–100.0)
Platelets: 351 10*3/uL (ref 150–400)
RBC: 2.45 MIL/uL — ABNORMAL LOW (ref 4.22–5.81)
RDW: 15.3 % (ref 11.5–15.5)
WBC: 9.4 10*3/uL (ref 4.0–10.5)
nRBC: 0 % (ref 0.0–0.2)

## 2023-07-11 LAB — PREPARE RBC (CROSSMATCH)

## 2023-07-11 LAB — HEMOGLOBIN AND HEMATOCRIT, BLOOD
HCT: 19.9 % — ABNORMAL LOW (ref 39.0–52.0)
Hemoglobin: 6.6 g/dL — CL (ref 13.0–17.0)

## 2023-07-11 LAB — ABO/RH: ABO/RH(D): B POS

## 2023-07-11 MED ORDER — ENSURE ENLIVE PO LIQD
237.0000 mL | Freq: Three times a day (TID) | ORAL | Status: DC
  Administered 2023-07-11 – 2023-07-15 (×11): 237 mL via ORAL

## 2023-07-11 MED ORDER — SENNOSIDES-DOCUSATE SODIUM 8.6-50 MG PO TABS
2.0000 | ORAL_TABLET | Freq: Every day | ORAL | Status: DC
  Administered 2023-07-11 – 2023-07-14 (×4): 2 via ORAL
  Filled 2023-07-11 (×4): qty 2

## 2023-07-11 MED ORDER — CYANOCOBALAMIN 1000 MCG/ML IJ SOLN
1000.0000 ug | Freq: Every day | INTRAMUSCULAR | Status: AC
  Administered 2023-07-11 – 2023-07-12 (×2): 1000 ug via INTRAMUSCULAR
  Filled 2023-07-11 (×2): qty 1

## 2023-07-11 MED ORDER — SODIUM CHLORIDE 0.9% IV SOLUTION
Freq: Once | INTRAVENOUS | Status: DC
Start: 2023-07-11 — End: 2023-07-15

## 2023-07-11 MED ORDER — FUROSEMIDE 10 MG/ML IJ SOLN
20.0000 mg | Freq: Once | INTRAMUSCULAR | Status: AC
  Administered 2023-07-11: 20 mg via INTRAVENOUS
  Filled 2023-07-11: qty 2

## 2023-07-11 MED ORDER — VANCOMYCIN HCL 1750 MG/350ML IV SOLN
1750.0000 mg | Freq: Once | INTRAVENOUS | Status: AC
  Administered 2023-07-11: 1750 mg via INTRAVENOUS
  Filled 2023-07-11: qty 350

## 2023-07-11 MED ORDER — K PHOS MONO-SOD PHOS DI & MONO 155-852-130 MG PO TABS
250.0000 mg | ORAL_TABLET | Freq: Three times a day (TID) | ORAL | Status: DC
Start: 2023-07-11 — End: 2023-07-15
  Administered 2023-07-11 – 2023-07-15 (×13): 250 mg via ORAL
  Filled 2023-07-11 (×13): qty 1

## 2023-07-11 MED ORDER — ADULT MULTIVITAMIN W/MINERALS CH
1.0000 | ORAL_TABLET | Freq: Every day | ORAL | Status: DC
  Administered 2023-07-11 – 2023-07-15 (×5): 1 via ORAL
  Filled 2023-07-11 (×5): qty 1

## 2023-07-11 MED ORDER — VANCOMYCIN HCL 1500 MG/300ML IV SOLN
1500.0000 mg | INTRAVENOUS | Status: DC
  Administered 2023-07-12 – 2023-07-14 (×3): 1500 mg via INTRAVENOUS
  Filled 2023-07-11 (×3): qty 300

## 2023-07-11 MED ORDER — VITAMIN B-12 1000 MCG PO TABS
1000.0000 ug | ORAL_TABLET | Freq: Every day | ORAL | Status: DC
  Administered 2023-07-13 – 2023-07-15 (×3): 1000 ug via ORAL
  Filled 2023-07-11 (×3): qty 1

## 2023-07-11 NOTE — Progress Notes (Signed)
 Initial Nutrition Assessment  DOCUMENTATION CODES:   Not applicable  INTERVENTION:   Liberalize diet to regular. Change supplement to Ensure Enlive po TID for additional calories, each supplement provides 350 kcal and 20 grams of protein. MVI with minerals daily.  NUTRITION DIAGNOSIS:   Inadequate oral intake related to lethargy/confusion as evidenced by meal completion < 50%, per patient/family report.  GOAL:   Patient will meet greater than or equal to 90% of their needs  MONITOR:   PO intake, Supplement acceptance  REASON FOR ASSESSMENT:   Consult Assessment of nutrition requirement/status, Poor PO, Diet education  ASSESSMENT:   68 yo male admitted with AKI, FTT. PMH includes dementia, schizophrenia, HTN, hypothyroidism, DM.  PTA, patient was more confused for 2-3 days. He had a decreased appetite for 2-3 weeks. He takes a protein supplement TID.    Currently on a heart healthy carb modified diet with meal intakes documented at 0-50% of meals.   Labs reviewed. Na 131, Hgb 6.6 CBG: 221-147  Medications reviewed and include vitamin B-12, Lasix , novolog , Semglee , K Phos , Miralax .  Weight history reviewed. No significant weight changes noted over the past 6 months.   NUTRITION - FOCUSED PHYSICAL EXAM:  Unable to complete  Diet Order:   Diet Order             Diet heart healthy/carb modified Room service appropriate? Yes; Fluid consistency: Thin  Diet effective now                   EDUCATION NEEDS:   Not appropriate for education at this time  Skin:  Skin Assessment: Skin Integrity Issues: Skin Integrity Issues:: Stage I Stage I: coccyx  Last BM:  5/19 type 6  Height:   Ht Readings from Last 1 Encounters:  07/09/23 5\' 10"  (1.778 m)    Weight:   Wt Readings from Last 1 Encounters:  07/09/23 87 kg    Ideal Body Weight:  75.5 kg  BMI:  Body mass index is 27.52 kg/m.  Estimated Nutritional Needs:   Kcal:  2000-2200  Protein:   95-115 gm  Fluid:  2-2.2 L   Barnet Boots RD, LDN, CNSC Contact via secure chat. If unavailable, use group chat "RD Inpatient."

## 2023-07-11 NOTE — Progress Notes (Signed)
 Date and time results received: 07/11/23 0630 (use smartphrase ".now" to insert current time)  Test: Hgb Critical Value: 6.5  Name of Provider Notified: Dr. Adefeso via secure chart  Orders Received? Or Actions Taken?: waiting for response the patient is asymptomatic

## 2023-07-11 NOTE — Progress Notes (Signed)
 Pharmacy Antibiotic Note  [name] is a 68 y.o. male admitted on 07/09/2023 with bacteremia.  Pharmacy has been consulted for vancomycin  dosing.  Plan: Vancomycin  1750 mg IV x 1 dose. Vancomycin  1500 mg IV every 24 hours. Monitor labs, c/s, and vanco levels as indicated.  Height: 5\' 10"  (177.8 cm) Weight: 87 kg (191 lb 12.8 oz) IBW/kg (Calculated) : 73  Temp (24hrs), Avg:98.7 F (37.1 C), Min:98 F (36.7 C), Max:100.1 F (37.8 C)  Recent Labs  Lab 07/09/23 1314 07/09/23 1402 07/09/23 1729 07/09/23 1957 07/10/23 0342 07/11/23 0404  WBC 10.9*  --   --   --  8.9 9.4  CREATININE 2.09*  --   --   --  1.57* 1.19  LATICACIDVEN 2.3* 2.4* 2.3* 2.2*  --   --     Estimated Creatinine Clearance: 62.2 mL/min (by C-G formula based on SCr of 1.19 mg/dL).    No Known Allergies  Antimicrobials this admission: Vanco 5/19 >>  CTX 5/17 >>   Microbiology results: 5/17 BCx: gram + cocci BCID: MRSA 5/17 UCx: multiple species    Thank you for allowing pharmacy to be a part of this patient's care.  Cliffton Dama, PharmD Clinical Pharmacist 07/11/2023 8:49 AM

## 2023-07-11 NOTE — Progress Notes (Signed)
 Telephone consent obtained by Pt's legal guardian--[name] ([phone]) and placed in Pt's chart.

## 2023-07-11 NOTE — Progress Notes (Signed)
 [facility]    Date of Admission:  07/09/2023   Total days of antibiotics 3/ day 1 vanco   ID: [name] is a 68 y.o. male with  T2DM, dementia, schizophrenia, hx of chronic suprapubic catheter. He was admitted early may for uti in the setting of SPC and aki. He was discharged on oral abtx and new catheter exchanged. His urine cx at that time had low count of serratia, PsA and MRSA. He is now readmitted on 5/17 with increase confusion x 3 days but no overt f/chills/n/v/d. His WBC was elevated at 11K, with 86%N, so thrombocytopenia similar to when admitted early may. Infectious work up shows 3/4 blood cx with MRSA; urine cx showing multiple species.  Principal Problem:   AKI (acute kidney injury) (HCC) Active Problems:   Hyponatremia   Constipation   Hypotonic bladder   Dementia (HCC)   DM (diabetes mellitus) (HCC)   Failure to thrive in adult   Metabolic acidosis   Protein deficiency (HCC)   Normocytic anemia   Lactic acidosis    Subjective:afebrile. Tmax of 100.38F  Medications:   sodium chloride    Intravenous Once   benztropine   2 mg Oral BID   cyanocobalamin   1,000 mcg Intramuscular Q1200   [START ON 07/13/2023] vitamin B-12  1,000 mcg Oral Daily   enoxaparin  (LOVENOX ) injection  40 mg Subcutaneous Q24H   feeding supplement  237 mL Oral TID BM   furosemide   20 mg Intravenous Once   insulin  aspart  0-15 Units Subcutaneous TID WC   insulin  aspart  0-5 Units Subcutaneous QHS   insulin  glargine-yfgn  10 Units Subcutaneous QHS   levothyroxine   75 mcg Oral Q0600   multivitamin with minerals  1 tablet Oral Daily   paliperidone   3 mg Oral BID   pantoprazole   40 mg Oral Daily   phosphorus  250 mg Oral TID   polyethylene glycol  17 g Oral BID   QUEtiapine   600 mg Oral QHS   [START ON 07/15/2023] rosuvastatin   5 mg Oral Weekly   senna-docusate  2 tablet Oral QHS    Objective: Vital signs in last 24 hours: Temp:  [98 F (36.7 C)-100.1 F (37.8  C)] 98.9 F (37.2 C) (05/19 1402) Pulse Rate:  [97-102] 97 (05/19 1402) Resp:  [16-20] 18 (05/19 1402) BP: (119-157)/(55-65) 135/62 (05/19 1402) SpO2:  [93 %-98 %] 94 % (05/19 1402)   Did not examine  Lab Results Recent Labs    07/10/23 0342 07/11/23 0404 07/11/23 0803  WBC 8.9 9.4  --   HGB 7.0* 6.5* 6.6*  HCT 21.6* 20.9* 19.9*  NA 128* 131*  --   K 4.2 3.7  --   CL 101 105  --   CO2 21* 23  --   BUN 28* 22  --   CREATININE 1.57* 1.19  --    Liver Panel Recent Labs    07/09/23 1314 07/11/23 0404  PROT 7.9  --   ALBUMIN  1.7* <1.5*  AST 26  --   ALT 20  --   ALKPHOS 105  --   BILITOT 0.9  --    Sedimentation Rate No results for input(s): "ESRSEDRATE" in the last 72 hours. C-Reactive Protein No results for input(s): "CRP" in the last 72 hours.  Microbiology: 5/17 blood cx MRSA 5/17 urine cx multiple organism Studies/Results: No results found.  Possible bilateral infiltrate.  Assessment/Plan: 68yo M with T2DM, dementia, schizophrenia, hx of chronic suprapubic  catheter, recently treated for uti/s/p catheter exchanged but discharged on cefuroxime (ur cx showing PsA, MRSA, serratia in low colonies on 5/1) -- now admitted with AMS, leukocytosis found to have MRSA bacteremia, suspect urinary source. CXR suspicious for infiltrate but he didn't have significant respiratory complaints.  Recommend to narrow abtx to vancomycin  Will dose adjust for cr function Repeat blood cx on 07/13/2023 to see bacteremia is clearing, since just starting vancomycin  Start with TTE but may need TEE to rule out endocarditis  Recommend to see if suprapubic catheter can be replaced Hopefully AMS will improve as infection is clearing Continue on contact isolation  evaluation of this patient requires complex antimicrobial therapy evaluation and counseling and isolation needs for disease transmission risk assessment and mitigation.    [facility] for Infect[facility]  D[facility] Pager: [phone]  07/11/2023, 3:47 PM

## 2023-07-11 NOTE — Plan of Care (Signed)

## 2023-07-11 NOTE — Progress Notes (Signed)
 PROGRESS NOTE  [name], is a 68 y.o. male, DOB - December 16, 1955, VZD:[id number]  Admit date - 07/09/2023   Admitting Physician Malka Sea, DO  Outpatient Primary MD for the patient is [name], MD  LOS - 2  Chief Complaint  Patient presents with   Weakness     Brief Narrative:  68 y.o. male with medical history significant of dementia, schizophrenia, hypertension, hypothyroidism, diabetes admitted on 07/09/2023 with AKI, dehydration with Hyponatremia failure to thrive possible UTI    -Assessment and Plan: 1)MRSA Bacteremia---??  Due to chronic indwelling suprapubic catheter Blood cultures from 07/09/2023 with MRSA - Narrow antibiotics to IV lock - Get TTE - Discussed with ID physician Dr. Levern Reader  2)AKI----acute kidney injury  -  creatinine on admission= 2.09  ,  baseline creatinine = 1.1 on 06/25/2023    ,  creatinine is now= trending down with hydration, -- renally adjust medications, avoid nephrotoxic agents / dehydration  / hypotension  3)Acute on chronic anemia--baseline hemoglobin usually around 8,  - Hgb down to 6.5 after hydration due to hemodilution -- Transfuse 1 unit of PRBC  4)DM2-A1C 7.0 -Continue Semglee  10 units nightly Use Novolog /Humalog Sliding scale insulin  with Accu-Cheks/Fingersticks as ordered   5)Hyponatremia--up to 131 from 124 after IV fluids  6)Paranoid schizophrenia/dementia --- stable, continue Cogentin  along with Seroquel  and Invega  - Patient was seen by his primary psychiatrist within a week prior to this admission  7) generalized weakness and deconditioning----ambulatory dysfunction  -PT eval requested  Hypothyroidism--- continue levothyroxine   GERD--continue Protonix   HLD--continue Crestor   Status is: Inpatient   Disposition: The patient is from: Home              Anticipated d/c is to: Home              Anticipated d/c date is: 2 days              Patient currently is not medically stable to d/c. Barriers: Not  Clinically Stable-   Code Status :  -  Code Status: Full Code   Family Communication:    (patient is alert, awake and coherent)  Nephew-- [name] [phone] and Sister [name]  [phone] are primary contact---  DVT Prophylaxis  :   - SCDs  enoxaparin  (LOVENOX ) injection 40 mg Start: 07/09/23 2200   Lab Results  Component Value Date   PLT 351 07/11/2023   Inpatient Medications  Scheduled Meds:  sodium chloride    Intravenous Once   benztropine   2 mg Oral BID   cyanocobalamin   1,000 mcg Intramuscular Q1200   [START ON 07/13/2023] vitamin B-12  1,000 mcg Oral Daily   enoxaparin  (LOVENOX ) injection  40 mg Subcutaneous Q24H   feeding supplement  237 mL Oral TID BM   furosemide   20 mg Intravenous Once   insulin  aspart  0-15 Units Subcutaneous TID WC   insulin  aspart  0-5 Units Subcutaneous QHS   insulin  glargine-yfgn  10 Units Subcutaneous QHS   levothyroxine   75 mcg Oral Q0600   multivitamin with minerals  1 tablet Oral Daily   paliperidone   3 mg Oral BID   pantoprazole   40 mg Oral Daily   phosphorus  250 mg Oral TID   polyethylene glycol  17 g Oral BID   QUEtiapine   600 mg Oral QHS   [START ON 07/15/2023] rosuvastatin   5 mg Oral Weekly   senna-docusate  2 tablet Oral QHS   Continuous Infusions:  [START ON 07/12/2023] vancomycin   PRN Meds:.acetaminophen  **OR** acetaminophen    Anti-infectives (From admission, onward)    Start     Dose/Rate Route Frequency Ordered Stop   07/12/23 0900  vancomycin  (VANCOREADY) IVPB 1500 mg/300 mL        1,500 mg 150 mL/hr over 120 Minutes Intravenous Every 24 hours 07/11/23 0819     07/11/23 0800  vancomycin  (VANCOREADY) IVPB 1750 mg/350 mL        1,750 mg 175 mL/hr over 120 Minutes Intravenous  Once 07/11/23 0733 07/11/23 1105   07/09/23 1600  cefTRIAXone  (ROCEPHIN ) 1 g in sodium chloride  0.9 % 100 mL IVPB  Status:  Discontinued        1 g 200 mL/hr over 30 Minutes Intravenous Every 24 hours 07/09/23 1551 07/11/23 1436        Subjective: [name] today has no fevers, no emesis,  No chest pain,   - Patient's nephew [name] [phone] and Sister [name]  [phone] are primary contact----I called and discussed patient's care with both of them on 07/11/2023 -Pt had BM - No bleeding concerns - Complains of fatigue and weakness   Objective: Vitals:   07/11/23 0522 07/11/23 1141 07/11/23 1156 07/11/23 1402  BP: (!) 119/55 132/65 129/60 135/62  Pulse: 100 99 (!) 102 97  Resp: 18 20 18 18   Temp: 100.1 F (37.8 C) 99.4 F (37.4 C) 99.2 F (37.3 C) 98.9 F (37.2 C)  TempSrc:  Oral Oral Oral  SpO2: 93% 96% 94% 94%  Weight:      Height:        Intake/Output Summary (Last 24 hours) at 07/11/2023 1537 Last data filed at 07/11/2023 1528 Gross per 24 hour  Intake 2319.69 ml  Output 1300 ml  Net 1019.69 ml   Filed Weights   07/09/23 1107  Weight: 87 kg    Physical Exam  Gen:- Awake Alert, in no acute distress HEENT:- [place].AT, No sclera icterus Neck-Supple Neck,No JVD,.  Lungs-  CTAB , fair symmetrical air movement CV- S1, S2 normal, regular  Abd-  +ve B.Sounds, Abd Soft, No tenderness, suprapubic catheter in situ Extremity/Skin:- No  edema, pedal pulses present  Psych-affect is appropriate, oriented x3, some forgetfulness Neuro-generalized weakness and deconditioning , no new focal deficits, no tremors  Data Reviewed: I have personally reviewed following labs and imaging studies  CBC: Recent Labs  Lab 07/09/23 1314 07/10/23 0342 07/11/23 0404 07/11/23 0803  WBC 10.9* 8.9 9.4  --   NEUTROABS 9.3*  --   --   --   HGB 7.6* 7.0* 6.5* 6.6*  HCT 23.8* 21.6* 20.9* 19.9*  MCV 86.9 85.7 85.3  --   PLT 412* 387 351  --    Basic Metabolic Panel: Recent Labs  Lab 07/09/23 1314 07/10/23 0342 07/11/23 0404  NA 124* 128* 131*  K 4.5 4.2 3.7  CL 94* 101 105  CO2 19* 21* 23  GLUCOSE 228* 147* 168*  BUN 36* 28* 22  CREATININE 2.09* 1.57* 1.19  CALCIUM  7.9* 7.7* 7.8*  PHOS   --   --  2.5   GFR: Estimated Creatinine Clearance: 62.2 mL/min (by C-G formula based on SCr of 1.19 mg/dL). Liver Function Tests: Recent Labs  Lab 07/09/23 1314 07/11/23 0404  AST 26  --   ALT 20  --   ALKPHOS 105  --   BILITOT 0.9  --   PROT 7.9  --   ALBUMIN  1.7* <1.5*   Cardiac Enzymes: Recent Labs  Lab 07/09/23 1314  CKTOTAL  113   Recent Results (from the past 240 hours)  Resp panel by RT-PCR (RSV, Flu A&B, Covid) Urine, Clean Catch     Status: None   Collection Time: 07/09/23 12:52 PM   Specimen: Urine, Clean Catch; Nasal Swab  Result Value Ref Range Status   SARS Coronavirus 2 by RT PCR NEGATIVE NEGATIVE Final    Comment: (NOTE) SARS-CoV-2 target nucleic acids are NOT DETECTED.  The SARS-CoV-2 RNA is generally detectable in upper respiratory specimens during the acute phase of infection. The lowest concentration of SARS-CoV-2 viral copies this assay can detect is 138 copies/mL. A negative result does not preclude SARS-Cov-2 infection and should not be used as the sole basis for treatment or other patient management decisions. A negative result may occur with  improper specimen collection/handling, submission of specimen other than nasopharyngeal swab, presence of viral mutation(s) within the areas targeted by this assay, and inadequate number of viral copies(<138 copies/mL). A negative result must be combined with clinical observations, patient history, and epidemiological information. The expected result is Negative.  Fact Sheet for Patients:  BloggerCourse.com  Fact Sheet for Healthcare Providers:  SeriousBroker.it  This test is no t yet approved or cleared by the [place]  FDA and  has been authorized for detection and/or diagnosis of SARS-CoV-2 by FDA under an Emergency Use Authorization (EUA). This EUA will remain  in effect (meaning this test can be used) for the duration of the COVID-19  declaration under Section 564(b)(1) of the Act, 21 U.S.C.section 360bbb-3(b)(1), unless the authorization is terminated  or revoked sooner.       Influenza A by PCR NEGATIVE NEGATIVE Final   Influenza B by PCR NEGATIVE NEGATIVE Final    Comment: (NOTE) The Xpert Xpress SARS-CoV-2/FLU/RSV plus assay is intended as an aid in the diagnosis of influenza from Nasopharyngeal swab specimens and should not be used as a sole basis for treatment. Nasal washings and aspirates are unacceptable for Xpert Xpress SARS-CoV-2/FLU/RSV testing.  Fact Sheet for Patients: BloggerCourse.com  Fact Sheet for Healthcare Providers: SeriousBroker.it  This test is not yet approved or cleared by the [place]  FDA and has been authorized for detection and/or diagnosis of SARS-CoV-2 by FDA under an Emergency Use Authorization (EUA). This EUA will remain in effect (meaning this test can be used) for the duration of the COVID-19 declaration under Section 564(b)(1) of the Act, 21 U.S.C. section 360bbb-3(b)(1), unless the authorization is terminated or revoked.     Resp Syncytial Virus by PCR NEGATIVE NEGATIVE Final    Comment: (NOTE) Fact Sheet for Patients: BloggerCourse.com  Fact Sheet for Healthcare Providers: SeriousBroker.it  This test is not yet approved or cleared by the [place]  FDA and has been authorized for detection and/or diagnosis of SARS-CoV-2 by FDA under an Emergency Use Authorization (EUA). This EUA will remain in effect (meaning this test can be used) for the duration of the COVID-19 declaration under Section 564(b)(1) of the Act, 21 U.S.C. section 360bbb-3(b)(1), unless the authorization is terminated or revoked.  Performed at [facility], [address] [place], [place] [address]   Urine Culture     Status: Abnormal   Collection Time: 07/09/23 12:52 PM   Specimen: Urine,  Random  Result Value Ref Range Status   Specimen Description   Final    URINE, RANDOM Performed at [facility], [address], [place], [place] [address]    Special Requests   Final    NONE Reflexed from [phone] Performed at [facility],  [address], [place], [place] [address]    Culture MULTIPLE SPECIES PRESENT, SUGGEST RECOLLECTION (A)  Final   Report Status 07/10/2023 FINAL  Final  Blood Culture (routine x 2)     Status: None (Preliminary result)   Collection Time: 07/09/23  1:14 PM   Specimen: Right Antecubital; Blood  Result Value Ref Range Status   Specimen Description   Final    RIGHT ANTECUBITAL BOTTLES DRAWN AEROBIC AND ANAEROBIC   Special Requests Blood Culture adequate volume  Final   Culture  Setup Time   Final    ANAEROBIC BOTTLE ONLY GRAM POSITIVE COCCI Gram Stain Report Called to,Read Back By and Verified WithKirk Peper [place] AT 6213 07/11/23 BY A WILSON Performed at [facility], [address], [place], [place] [address]    Culture [facility] POSITIVE COCCI  Final   Report Status PENDING  Incomplete  Blood Culture (routine x 2)     Status: Abnormal (Preliminary result)   Collection Time: 07/09/23  1:14 PM   Specimen: BLOOD RIGHT HAND  Result Value Ref Range Status   Specimen Description   Final    BLOOD RIGHT HAND BOTTLES DRAWN AEROBIC AND ANAEROBIC Performed at [facility], [address], [place], [place] [address]    Special Requests   Final    Blood Culture adequate volume Performed at [facility], [address], [place], [place] [address]    Culture  Setup Time   Final    GRAM POSITIVE COCCI IN BOTH AEROBIC AND ANAEROBIC BOTTLES Gram Stain Report Called to,Read Back By and Verified With: T LENARD AT 1350 ON [id number] BY S [place] CRITICAL RESULT CALLED TO, READ BACK BY AND VERIFIED WITH: J ATAMBILE RN 07/10/2023 @ 2259 BY AB    Culture (A)  Final    STAPHYLOCOCCUS AUREUS SUSCEPTIBILITIES TO FOLLOW Performed at [facility], [address],  [place], [place] [address]    Report Status PENDING  Incomplete  Blood Culture ID Panel (Reflexed)     Status: Abnormal   Collection Time: 07/09/23  1:14 PM  Result Value Ref Range Status   Enterococcus faecalis NOT DETECTED NOT DETECTED Final   Enterococcus Faecium NOT DETECTED NOT DETECTED Final   Listeria monocytogenes NOT DETECTED NOT DETECTED Final   Staphylococcus species DETECTED (A) NOT DETECTED Final    Comment: CRITICAL RESULT CALLED TO, READ BACK BY AND VERIFIED WITH: J ATAMBILE RN 07/10/2023 @ 2259 BY AB    Staphylococcus aureus (BCID) DETECTED (A) NOT DETECTED Final    Comment: Methicillin (oxacillin)-resistant Staphylococcus aureus (MRSA). MRSA is predictably resistant to beta-lactam antibiotics (except ceftaroline). Preferred therapy is vancomycin  unless clinically contraindicated. Patient requires contact precautions if  hospitalized. CRITICAL RESULT CALLED TO, READ BACK BY AND VERIFIED WITH: J ATAMBILE RN 07/10/2023 @ 2259 BY AB    Staphylococcus epidermidis NOT DETECTED NOT DETECTED Final   Staphylococcus lugdunensis NOT DETECTED NOT DETECTED Final   Streptococcus species NOT DETECTED NOT DETECTED Final   Streptococcus agalactiae NOT DETECTED NOT DETECTED Final   Streptococcus pneumoniae NOT DETECTED NOT DETECTED Final   Streptococcus pyogenes NOT DETECTED NOT DETECTED Final   A.calcoaceticus-baumannii NOT DETECTED NOT DETECTED Final   Bacteroides fragilis NOT DETECTED NOT DETECTED Final   Enterobacterales NOT DETECTED NOT DETECTED Final   Enterobacter cloacae complex NOT DETECTED NOT DETECTED Final   Escherichia coli NOT DETECTED NOT DETECTED Final   Klebsiella aerogenes NOT DETECTED NOT DETECTED Final   Klebsiella oxytoca NOT DETECTED NOT DETECTED Final   Klebsiella  pneumoniae NOT DETECTED NOT DETECTED Final   Proteus species NOT DETECTED NOT DETECTED Final   Salmonella species NOT DETECTED NOT DETECTED Final   Serratia marcescens NOT DETECTED NOT DETECTED Final    Haemophilus influenzae NOT DETECTED NOT DETECTED Final   Neisseria meningitidis NOT DETECTED NOT DETECTED Final   Pseudomonas aeruginosa NOT DETECTED NOT DETECTED Final   Stenotrophomonas maltophilia NOT DETECTED NOT DETECTED Final   Candida albicans NOT DETECTED NOT DETECTED Final   Candida auris NOT DETECTED NOT DETECTED Final   Candida glabrata NOT DETECTED NOT DETECTED Final   Candida krusei NOT DETECTED NOT DETECTED Final   Candida parapsilosis NOT DETECTED NOT DETECTED Final   Candida tropicalis NOT DETECTED NOT DETECTED Final   Cryptococcus neoformans/gattii NOT DETECTED NOT DETECTED Final   Meth resistant mecA/C and MREJ DETECTED (A) NOT DETECTED Final    Comment: CRITICAL RESULT CALLED TO, READ BACK BY AND VERIFIED WITH: J ATAMBILE RN 07/10/2023 @ 2259 BY AB Performed at [facility], [address] [place], [place] [address]     Scheduled Meds:  sodium chloride    Intravenous Once   benztropine   2 mg Oral BID   cyanocobalamin   1,000 mcg Intramuscular Q1200   [START ON 07/13/2023] vitamin B-12  1,000 mcg Oral Daily   enoxaparin  (LOVENOX ) injection  40 mg Subcutaneous Q24H   feeding supplement  237 mL Oral TID BM   furosemide   20 mg Intravenous Once   insulin  aspart  0-15 Units Subcutaneous TID WC   insulin  aspart  0-5 Units Subcutaneous QHS   insulin  glargine-yfgn  10 Units Subcutaneous QHS   levothyroxine   75 mcg Oral Q0600   multivitamin with minerals  1 tablet Oral Daily   paliperidone   3 mg Oral BID   pantoprazole   40 mg Oral Daily   phosphorus  250 mg Oral TID   polyethylene glycol  17 g Oral BID   QUEtiapine   600 mg Oral QHS   [START ON 07/15/2023] rosuvastatin   5 mg Oral Weekly   senna-docusate  2 tablet Oral QHS   Continuous Infusions:  [START ON 07/12/2023] vancomycin        LOS: 2 days    Colin Dawley M.D on 07/11/2023 at 3:37 PM  Go to [email or web address] - for contact info  [facility] - Office  [phone]  If 7PM-7AM, please contact  night-coverage [email or web address] 07/11/2023, 3:37 PM

## 2023-07-12 ENCOUNTER — Other Ambulatory Visit (HOSPITAL_COMMUNITY): Payer: Self-pay | Admitting: *Deleted

## 2023-07-12 ENCOUNTER — Inpatient Hospital Stay (HOSPITAL_COMMUNITY)

## 2023-07-12 DIAGNOSIS — R7881 Bacteremia: Secondary | ICD-10-CM | POA: Diagnosis not present

## 2023-07-12 DIAGNOSIS — N179 Acute kidney failure, unspecified: Secondary | ICD-10-CM | POA: Diagnosis not present

## 2023-07-12 DIAGNOSIS — E440 Moderate protein-calorie malnutrition: Secondary | ICD-10-CM | POA: Insufficient documentation

## 2023-07-12 LAB — GLUCOSE, CAPILLARY
Glucose-Capillary: 122 mg/dL — ABNORMAL HIGH (ref 70–99)
Glucose-Capillary: 146 mg/dL — ABNORMAL HIGH (ref 70–99)
Glucose-Capillary: 182 mg/dL — ABNORMAL HIGH (ref 70–99)
Glucose-Capillary: 167 mg/dL — ABNORMAL HIGH (ref 70–99)

## 2023-07-12 LAB — ECHOCARDIOGRAM COMPLETE
AR max vel: 3.05 cm2
AV Area VTI: 2.78 cm2
AV Area mean vel: 3.19 cm2
AV Mean grad: 6 mmHg
AV Peak grad: 11.3 mmHg
Ao pk vel: 1.68 m/s
Area-P 1/2: 3.08 cm2
Est EF: >75
Height: 70 in
S' Lateral: 2.7 cm
Weight: 3068.8 [oz_av]

## 2023-07-12 LAB — CBC
HCT: 23.5 % — ABNORMAL LOW (ref 39.0–52.0)
Hemoglobin: 7.5 g/dL — ABNORMAL LOW (ref 13.0–17.0)
MCH: 27.5 pg (ref 26.0–34.0)
MCHC: 31.9 g/dL (ref 30.0–36.0)
MCV: 86.1 fL (ref 80.0–100.0)
Platelets: 380 10*3/uL (ref 150–400)
RBC: 2.73 MIL/uL — ABNORMAL LOW (ref 4.22–5.81)
RDW: 15.5 % (ref 11.5–15.5)
WBC: 9.6 10*3/uL (ref 4.0–10.5)
nRBC: 0 % (ref 0.0–0.2)

## 2023-07-12 LAB — BPAM RBC
Blood Product Expiration Date: 202506122359
ISSUE DATE / TIME: 202505191134
Unit Type and Rh: 5100

## 2023-07-12 LAB — TYPE AND SCREEN
ABO/RH(D): B POS
Antibody Screen: NEGATIVE
Unit division: 0

## 2023-07-12 MED ORDER — ZINC SULFATE 220 (50 ZN) MG PO CAPS
220.0000 mg | ORAL_CAPSULE | Freq: Every day | ORAL | Status: DC
  Administered 2023-07-12 – 2023-07-15 (×4): 220 mg via ORAL
  Filled 2023-07-12 (×4): qty 1

## 2023-07-12 MED ORDER — OXYBUTYNIN CHLORIDE 5 MG PO TABS: 5.0000 mg | ORAL_TABLET | Freq: Three times a day (TID) | ORAL | Status: DC | PRN

## 2023-07-12 MED ORDER — HYDROCODONE-ACETAMINOPHEN 5-325 MG PO TABS
1.0000 | ORAL_TABLET | Freq: Four times a day (QID) | ORAL | Status: DC | PRN
  Administered 2023-07-12 – 2023-07-15 (×5): 1 via ORAL
  Filled 2023-07-12 (×5): qty 1

## 2023-07-12 MED ORDER — VITAMIN C 500 MG PO TABS
500.0000 mg | ORAL_TABLET | Freq: Every day | ORAL | Status: DC
  Administered 2023-07-12 – 2023-07-15 (×4): 500 mg via ORAL
  Filled 2023-07-12 (×4): qty 1

## 2023-07-12 MED ORDER — OXYBUTYNIN CHLORIDE 5 MG PO TABS
5.0000 mg | ORAL_TABLET | Freq: Three times a day (TID) | ORAL | Status: DC
  Administered 2023-07-12 – 2023-07-15 (×8): 5 mg via ORAL
  Filled 2023-07-12 (×8): qty 1

## 2023-07-12 MED ORDER — PERFLUTREN LIPID MICROSPHERE
1.0000 mL | INTRAVENOUS | Status: AC | PRN
  Administered 2023-07-12: 3 mL via INTRAVENOUS

## 2023-07-12 NOTE — Progress Notes (Signed)
 This pts suprapubic cath is continuously leaking at insertion site and there is very minimal output in the drainage bag. Dr. Belva Boyden informed and Dr. Charolett Copes tagged on the message. Will continue to monitor pt.

## 2023-07-12 NOTE — Progress Notes (Signed)
*  PRELIMINARY RESULTS* Echocardiogram 2D Echocardiogram has been performed.  [name] 07/12/2023, 3:50 PM

## 2023-07-12 NOTE — Progress Notes (Signed)
 Patient was noted with unstageable right heel ulcer    Wound nurse will be consulted

## 2023-07-12 NOTE — Progress Notes (Signed)
 PT Cancellation Note  Patient Details Name: [name] MRN: [id number] DOB: 16-Jul-1955   Cancelled Treatment:    Reason Eval/Treat Not Completed: Pain limiting ability to participate Patient declined mobility on this date. Reports increased pain in BLE, 6/10 pain, increases with passive motion with therapist passively mobilizing LE (increased tone noted) and bringing LE towards EOB. Will try for PT eval at later time/date.   11:50 AM, 07/12/23 Marysue Sola, PT, DPT [place] with [facility]

## 2023-07-12 NOTE — Consult Note (Signed)
 WOC Nurse Consult Note: Reason for Consult: R heel ulcer  Wound type: Unstageable Pressure Injury with 2 areas of full thickness loss adjacent  Pressure Injury POA: Yes Measurement: see nursing flowsheet  Wound bed: black eschar to heel, two adjacent wounds with some red moist tissue, some black eschar  Drainage (amount, consistency, odor) per nursing flowsheet  Periwound: mild erythema  Dressing procedure/placement/frequency:  Cleanse R heel/posterior lower leg wounds with Vashe wound cleanser Timm Foot [phone]) do not rinse and allow to air dry.  Apply Xeroform gauze ([place] [phone]) to wound beds daily, cover with dry gauze and Kerlix roll gauze. Place foot in Prevalon boot to offload pressure Timm Foot [phone]).   Patient would benefit from referral to podiatry/orthopedics for ongoing management of this wound.   POC discussed with bedside nurse. WOC team will not follow. Re-consult if further needs arise.   Thank you,    Ronni Colace MSN, RN-BC, [employer] [phone]

## 2023-07-12 NOTE — Plan of Care (Signed)
  Problem: Education: Goal: Knowledge of General Education information will improve Description: Including pain rating scale, medication(s)/side effects and non-pharmacologic comfort measures Outcome: Progressing   Problem: Skin Integrity: Goal: Risk for impaired skin integrity will decrease Outcome: Progressing   Problem: Coping: Goal: Ability to adjust to condition or change in health will improve Outcome: Progressing   Problem: Nutritional: Goal: Maintenance of adequate nutrition will improve Outcome: Progressing

## 2023-07-12 NOTE — Progress Notes (Signed)
 Initial Nutrition Assessment  DOCUMENTATION CODES:   Non-severe (moderate) malnutrition in context of social or environmental circumstances  INTERVENTION:   Continue regular diet. Continue Ensure Enlive po TID,  each supplement provides 350 kcal and 20 grams of protein. Continue MVI with minerals daily. Vitamin C 500 mg daily x 30 days to support wound healing. Zinc Sulfate 220 mg daily x 14 days to support wound healing.   NUTRITION DIAGNOSIS:   Moderate Malnutrition related to social / environmental circumstances as evidenced by mild muscle depletion, moderate muscle depletion, moderate fat depletion. Ongoing   GOAL:   Patient will meet greater than or equal to 90% of their needs Progressing  MONITOR:   PO intake, Supplement acceptance, Skin  REASON FOR ASSESSMENT:   Consult Assessment of nutrition requirement/status, Poor PO, Diet education  ASSESSMENT:   68 yo male admitted with AKI, FTT. PMH includes dementia, schizophrenia, HTN, hypothyroidism, DM.  Patient reports that he thinks he has lost weight; also states he has been eating well. Currently on a regular diet with meal intakes 0-25% yesterday. He said he likes Ensure. Per [facility], it was provided to patient twice yesterday.   Labs reviewed.  CBG: 122-146 today; 96-242 yesterday  Medications reviewed and include vitamin B-12, novolog , Semglee , MVI with minerals, K Phos , Miralax , Senokot-S.  Patient meets criteria for moderate malnutrition, given mild-moderate depletion of muscle and subcutaneous fat mass.  NUTRITION - FOCUSED PHYSICAL EXAM:  Flowsheet Row Most Recent Value  Orbital Region Moderate depletion  Upper Arm Region Moderate depletion  Thoracic and Lumbar Region Moderate depletion  Buccal Region Moderate depletion  Temple Region Moderate depletion  Clavicle Bone Region Mild depletion  Clavicle and Acromion Bone Region No depletion  Scapular Bone Region Unable to assess  Dorsal Hand Mild depletion   Patellar Region Moderate depletion  Anterior Thigh Region Moderate depletion  Posterior Calf Region Moderate depletion  Edema (RD Assessment) Mild  Hair Reviewed  Eyes Reviewed  Mouth Reviewed  Skin Reviewed  Nails Reviewed       Diet Order:   Diet Order             Diet regular Room service appropriate? Yes with Assist; Fluid consistency: Thin  Diet effective now                   EDUCATION NEEDS:   Not appropriate for education at this time  Skin:  Skin Assessment: Skin Integrity Issues: Skin Integrity Issues:: Stage I, Unstageable, Other (Comment) Stage I: coccyx Unstageable: R heel Other: non pressure wound to L ankle  Last BM:  5/19 type 6  Height:   Ht Readings from Last 1 Encounters:  07/09/23 5\' 10"  (1.778 m)    Weight:   Wt Readings from Last 1 Encounters:  07/09/23 87 kg    Ideal Body Weight:  75.5 kg  BMI:  Body mass index is 27.52 kg/m.  Estimated Nutritional Needs:   Kcal:  2000-2200  Protein:  95-115 gm  Fluid:  2-2.2 L   Barnet Boots RD, LDN, CNSC Contact via secure chat. If unavailable, use group chat "RD Inpatient."

## 2023-07-12 NOTE — Progress Notes (Signed)
 PROGRESS NOTE  [name], is a 68 y.o. male, DOB - October 27, 1955, HQI:[id number]  Admit date - 07/09/2023   Admitting Physician No admitting provider for patient encounter.  Outpatient Primary MD for the patient is Clifford Bitters, MD  LOS - 3  Chief Complaint  Patient presents with   Weakness     Brief Narrative:  68 y.o. male with medical history significant of dementia, schizophrenia, hypertension, hypothyroidism, diabetes admitted on 07/09/2023 with AKI, dehydration with Hyponatremia failure to thrive possible UTI and found to have MRSA bacteremia    -Assessment and Plan: 1)MRSA Bacteremia---??  Due to chronic indwelling suprapubic catheter Blood cultures from 07/09/2023 with MRSA - Narrow antibiotics to IV vancomycin  (started on 07/11/2023) - TTE on 07/12/2023 without vegetations -Cardiology consult for TEE requested - Discussed with ID physician Dr. Levern Huerta, ID consult appreciated - Repeat blood cultures on 07/13/2023 - Suprapubic catheter last changed 06/30/2023--per Dr. Claretta Huerta (Urology) no indication to change catheter  2)AKI----acute kidney injury  -  creatinine on admission= 2.09  ,  baseline creatinine = 1.1 on 06/25/2023    ,  creatinine is now= trending down with hydration, -- renally adjust medications, avoid nephrotoxic agents / dehydration  / hypotension  3)Acute on chronic anemia--baseline hemoglobin usually around 8,  - Hgb dow after hydration due to hemodilution -Hgb up to 7.5 from 6.6 after transfusion 1 unit of PRBC on 07/11/2023 - CBC in a.m.  4)DM2-A1C 7.0 -Continue Semglee  10 units nightly Use Novolog /Humalog Sliding scale insulin  with Accu-Cheks/Fingersticks as ordered   5)Hyponatremia--up to 131 from 124 after IV fluids  6)Paranoid schizophrenia/dementia --- stable, continue Cogentin  along with Seroquel  and Invega  - Patient was seen by his primary psychiatrist within a week prior to this admission  7)Generalized weakness and deconditioning----ambulatory  dysfunction  - Please get PT eval prior to discharge - Family open home with taking patient back home with home health services as long as he is ambulatory  8) chronic indwelling suprapubic catheter--- - Suprapubic catheter last changed 06/30/2023--per Dr. Claretta Huerta (Urology) no indication to change catheter - Catheter is leaking--Dr. Claretta Huerta says no need to change he recommends oxybutynin for presumed bladder spasms instead - Please reconsult Dr. Claretta Huerta from urology service if concerns persist  Hypothyroidism--- continue levothyroxine   GERD--continue Protonix   HLD--continue Crestor   Status is: Inpatient   Disposition: The patient is from: Home              Anticipated d/c is to: Home              Anticipated d/c date is: 2 days              Patient currently is not medically stable to d/c. Barriers: Not Clinically Stable-   Code Status :  -  Code Status: Full Code   Family Communication:    (patient is alert, awake and coherent)  Nephew-- [name] [phone] and Sister [name]  [phone] are primary contact---  DVT Prophylaxis  :   - SCDs  enoxaparin  (LOVENOX ) injection 40 mg Start: 07/09/23 2200   Lab Results  Component Value Date   PLT 380 07/12/2023   Inpatient Medications  Scheduled Meds:  sodium chloride    Intravenous Once   ascorbic acid  500 mg Oral Daily   benztropine   2 mg Oral BID   [START ON 07/13/2023] vitamin B-12  1,000 mcg Oral Daily   enoxaparin  (LOVENOX ) injection  40 mg Subcutaneous Q24H   feeding supplement  237 mL Oral TID BM  insulin  aspart  0-15 Units Subcutaneous TID WC   insulin  aspart  0-5 Units Subcutaneous QHS   insulin  glargine-yfgn  10 Units Subcutaneous QHS   levothyroxine   75 mcg Oral Q0600   multivitamin with minerals  1 tablet Oral Daily   oxybutynin  5 mg Oral TID   paliperidone   3 mg Oral BID   pantoprazole   40 mg Oral Daily   phosphorus  250 mg Oral TID   polyethylene glycol  17 g Oral BID   QUEtiapine   600 mg  Oral QHS   [START ON 07/15/2023] rosuvastatin   5 mg Oral Weekly   senna-docusate  2 tablet Oral QHS   zinc sulfate (50mg  elemental zinc)  220 mg Oral Daily   Continuous Infusions:  vancomycin  1,500 mg (07/12/23 1016)   PRN Meds:.acetaminophen  **OR** acetaminophen , HYDROcodone-acetaminophen , oxybutynin   Anti-infectives (From admission, onward)    Start     Dose/Rate Route Frequency Ordered Stop   07/12/23 0900  vancomycin  (VANCOREADY) IVPB 1500 mg/300 mL        1,500 mg 150 mL/hr over 120 Minutes Intravenous Every 24 hours 07/11/23 0819     07/11/23 0800  vancomycin  (VANCOREADY) IVPB 1750 mg/350 mL        1,750 mg 175 mL/hr over 120 Minutes Intravenous  Once 07/11/23 0733 07/11/23 1105   07/09/23 1600  cefTRIAXone  (ROCEPHIN ) 1 g in sodium chloride  0.9 % 100 mL IVPB  Status:  Discontinued        1 g 200 mL/hr over 30 Minutes Intravenous Every 24 hours 07/09/23 1551 07/11/23 1436       Subjective: [name] today has no fevers, no emesis,  No chest pain,   - -Less confused, - Oral intake is fair - Suprapubic catheter leaking a bit   Objective: Vitals:   07/11/23 1402 07/11/23 2110 07/12/23 0507 07/12/23 1409  BP: 135/62 138/67 (!) 104/53 (!) 118/53  Pulse: 97 (!) 110 91 87  Resp: 18 20 18 16   Temp: 98.9 F (37.2 C) 97.6 F (36.4 C) 98.8 F (37.1 C) 98.8 F (37.1 C)  TempSrc: Oral Oral Oral   SpO2: 94% 100% 97%   Weight:      Height:        Intake/Output Summary (Last 24 hours) at 07/12/2023 1823 Last data filed at 07/12/2023 1700 Gross per 24 hour  Intake 1300 ml  Output 400 ml  Net 900 ml   Filed Weights   07/09/23 1107  Weight: 87 kg    Physical Exam  Gen:- Awake Alert, in no acute distress HEENT:- [name].AT, No sclera icterus Neck-Supple Neck,No JVD,.  Lungs-  CTAB , fair symmetrical air movement CV- S1, S2 normal, regular  Abd-  +ve B.Sounds, Abd Soft, No tenderness, suprapubic catheter in situ (Suprapubic catheter leaking a bit) Extremity/Skin:-  No  edema, pedal pulses present  Psych-affect is appropriate, oriented x3, some forgetfulness Neuro-generalized weakness and deconditioning , no new focal deficits, no tremors  Data Reviewed: I have personally reviewed following labs and imaging studies  CBC: Recent Labs  Lab 07/09/23 1314 07/10/23 0342 07/11/23 0404 07/11/23 0803 07/12/23 0409  WBC 10.9* 8.9 9.4  --  9.6  NEUTROABS 9.3*  --   --   --   --   HGB 7.6* 7.0* 6.5* 6.6* 7.5*  HCT 23.8* 21.6* 20.9* 19.9* 23.5*  MCV 86.9 85.7 85.3  --  86.1  PLT 412* 387 351  --  380   Basic Metabolic Panel: Recent Labs  Lab  07/09/23 1314 07/10/23 0342 07/11/23 0404  NA 124* 128* 131*  K 4.5 4.2 3.7  CL 94* 101 105  CO2 19* 21* 23  GLUCOSE 228* 147* 168*  BUN 36* 28* 22  CREATININE 2.09* 1.57* 1.19  CALCIUM  7.9* 7.7* 7.8*  PHOS  --   --  2.5   GFR: Estimated Creatinine Clearance: 62.2 mL/min (by C-G formula based on SCr of 1.19 mg/dL). Liver Function Tests: Recent Labs  Lab 07/09/23 1314 07/11/23 0404  AST 26  --   ALT 20  --   ALKPHOS 105  --   BILITOT 0.9  --   PROT 7.9  --   ALBUMIN  1.7* <1.5*   Cardiac Enzymes: Recent Labs  Lab 07/09/23 1314  CKTOTAL 113   Recent Results (from the past 240 hours)  Resp panel by RT-PCR (RSV, Flu A&B, Covid) Urine, Clean Catch     Status: None   Collection Time: 07/09/23 12:52 PM   Specimen: Urine, Clean Catch; Nasal Swab  Result Value Ref Range Status   SARS Coronavirus 2 by RT PCR NEGATIVE NEGATIVE Final    Comment: (NOTE) SARS-CoV-2 target nucleic acids are NOT DETECTED.  The SARS-CoV-2 RNA is generally detectable in upper respiratory specimens during the acute phase of infection. The lowest concentration of SARS-CoV-2 viral copies this assay can detect is 138 copies/mL. A negative result does not preclude SARS-Cov-2 infection and should not be used as the sole basis for treatment or other patient management decisions. A negative result may occur with  improper  specimen collection/handling, submission of specimen other than nasopharyngeal swab, presence of viral mutation(s) within the areas targeted by this assay, and inadequate number of viral copies(<138 copies/mL). A negative result must be combined with clinical observations, patient history, and epidemiological information. The expected result is Negative.  Fact Sheet for Patients:  BloggerCourse.com  Fact Sheet for Healthcare Providers:  SeriousBroker.it  This test is no t yet approved or cleared by the [place]  FDA and  has been authorized for detection and/or diagnosis of SARS-CoV-2 by FDA under an Emergency Use Authorization (EUA). This EUA will remain  in effect (meaning this test can be used) for the duration of the COVID-19 declaration under Section 564(b)(1) of the Act, 21 U.S.C.section 360bbb-3(b)(1), unless the authorization is terminated  or revoked sooner.       Influenza A by PCR NEGATIVE NEGATIVE Final   Influenza B by PCR NEGATIVE NEGATIVE Final    Comment: (NOTE) The Xpert Xpress SARS-CoV-2/FLU/RSV plus assay is intended as an aid in the diagnosis of influenza from Nasopharyngeal swab specimens and should not be used as a sole basis for treatment. Nasal washings and aspirates are unacceptable for Xpert Xpress SARS-CoV-2/FLU/RSV testing.  Fact Sheet for Patients: BloggerCourse.com  Fact Sheet for Healthcare Providers: SeriousBroker.it  This test is not yet approved or cleared by the [place]  FDA and has been authorized for detection and/or diagnosis of SARS-CoV-2 by FDA under an Emergency Use Authorization (EUA). This EUA will remain in effect (meaning this test can be used) for the duration of the COVID-19 declaration under Section 564(b)(1) of the Act, 21 U.S.C. section 360bbb-3(b)(1), unless the authorization is terminated or revoked.     Resp  Syncytial Virus by PCR NEGATIVE NEGATIVE Final    Comment: (NOTE) Fact Sheet for Patients: BloggerCourse.com  Fact Sheet for Healthcare Providers: SeriousBroker.it  This test is not yet approved or cleared by the [place]  FDA and has been authorized for detection and/or diagnosis  of SARS-CoV-2 by FDA under an Emergency Use Authorization (EUA). This EUA will remain in effect (meaning this test can be used) for the duration of the COVID-19 declaration under Section 564(b)(1) of the Act, 21 U.S.C. section 360bbb-3(b)(1), unless the authorization is terminated or revoked.  Performed at [facility], [address] [place], [place] [address]   Urine Culture     Status: Abnormal   Collection Time: 07/09/23 12:52 PM   Specimen: Urine, Random  Result Value Ref Range Status   Specimen Description   Final    URINE, RANDOM Performed at [facility], [address] [place], [place] [address]    Special Requests   Final    NONE Reflexed from [id number] Performed at [facility], [address], [place], [place] [address]    Culture MULTIPLE SPECIES PRESENT, SUGGEST RECOLLECTION (A)  Final   Report Status 07/10/2023 FINAL  Final  Blood Culture (routine x 2)     Status: Abnormal (Preliminary result)   Collection Time: 07/09/23  1:14 PM   Specimen: Right Antecubital; Blood  Result Value Ref Range Status   Specimen Description   Final    RIGHT ANTECUBITAL BOTTLES DRAWN AEROBIC AND ANAEROBIC Performed at [facility], [address], [place], [place] [address]    Special Requests   Final    Blood Culture adequate volume Performed at [facility], [address] [place], [place] [address]    Culture  Setup Time   Final    ANAEROBIC BOTTLE ONLY GRAM POSITIVE COCCI Gram Stain Report Called to,Read Back By and Verified WithKirk Peper [facility] AT 8413 07/11/23 BY A WILSON Performed at [facility], [address], [place], [place] [address]    Culture  (A)  Final    STAPHYLOCOCCUS AUREUS SUSCEPTIBILITIES PERFORMED ON PREVIOUS CULTURE WITHIN THE LAST 5 DAYS. Performed at [facility], [address], [place], [place] [address]    Report Status PENDING  Incomplete  Blood Culture (routine x 2)     Status: Abnormal   Collection Time: 07/09/23  1:14 PM   Specimen: BLOOD RIGHT HAND  Result Value Ref Range Status   Specimen Description   Final    BLOOD RIGHT HAND BOTTLES DRAWN AEROBIC AND ANAEROBIC Performed at [facility], [address], [place], [place] [address]    Special Requests   Final    Blood Culture adequate volume Performed at [facility], [address] [place], [place] [address]    Culture  Setup Time   Final    GRAM POSITIVE COCCI IN BOTH AEROBIC AND ANAEROBIC BOTTLES Gram Stain Report Called to,Read Back By and Verified With: T LENARD AT 1350 ON [id number] BY S DALTON CRITICAL RESULT CALLED TO, READ BACK BY AND VERIFIED WITH: J ATAMBILE RN 07/10/2023 @ 2259 BY AB Performed at [facility], [address], [place], [place] [address]    Culture METHICILLIN RESISTANT STAPHYLOCOCCUS AUREUS (A)  Final   Report Status 07/12/2023 FINAL  Final   Organism ID, Bacteria METHICILLIN RESISTANT STAPHYLOCOCCUS AUREUS  Final      Susceptibility   Methicillin resistant staphylococcus aureus - MIC*    CIPROFLOXACIN  <=0.5 SENSITIVE Sensitive     ERYTHROMYCIN >=8 RESISTANT Resistant     GENTAMICIN <=0.5 SENSITIVE Sensitive     OXACILLIN >=4 RESISTANT Resistant     TETRACYCLINE <=1 SENSITIVE Sensitive     VANCOMYCIN  1 SENSITIVE Sensitive     TRIMETH /SULFA  <=10 SENSITIVE Sensitive     CLINDAMYCIN <=0.25 SENSITIVE  Sensitive     RIFAMPIN <=0.5 SENSITIVE Sensitive     Inducible Clindamycin NEGATIVE Sensitive     LINEZOLID 2 SENSITIVE Sensitive     * METHICILLIN RESISTANT STAPHYLOCOCCUS AUREUS  Blood Culture ID Panel (Reflexed)     Status: Abnormal   Collection Time: 07/09/23  1:14 PM  Result Value Ref Range Status    Enterococcus faecalis NOT DETECTED NOT DETECTED Final   Enterococcus Faecium NOT DETECTED NOT DETECTED Final   Listeria monocytogenes NOT DETECTED NOT DETECTED Final   Staphylococcus species DETECTED (A) NOT DETECTED Final    Comment: CRITICAL RESULT CALLED TO, READ BACK BY AND VERIFIED WITH: J ATAMBILE RN 07/10/2023 @ 2259 BY AB    Staphylococcus aureus (BCID) DETECTED (A) NOT DETECTED Final    Comment: Methicillin (oxacillin)-resistant Staphylococcus aureus (MRSA). MRSA is predictably resistant to beta-lactam antibiotics (except ceftaroline). Preferred therapy is vancomycin  unless clinically contraindicated. Patient requires contact precautions if  hospitalized. CRITICAL RESULT CALLED TO, READ BACK BY AND VERIFIED WITH: J ATAMBILE RN 07/10/2023 @ 2259 BY AB    Staphylococcus epidermidis NOT DETECTED NOT DETECTED Final   Staphylococcus lugdunensis NOT DETECTED NOT DETECTED Final   Streptococcus species NOT DETECTED NOT DETECTED Final   Streptococcus agalactiae NOT DETECTED NOT DETECTED Final   Streptococcus pneumoniae NOT DETECTED NOT DETECTED Final   Streptococcus pyogenes NOT DETECTED NOT DETECTED Final   A.calcoaceticus-baumannii NOT DETECTED NOT DETECTED Final   Bacteroides fragilis NOT DETECTED NOT DETECTED Final   Enterobacterales NOT DETECTED NOT DETECTED Final   Enterobacter cloacae complex NOT DETECTED NOT DETECTED Final   Escherichia coli NOT DETECTED NOT DETECTED Final   Klebsiella aerogenes NOT DETECTED NOT DETECTED Final   Klebsiella oxytoca NOT DETECTED NOT DETECTED Final   Klebsiella pneumoniae NOT DETECTED NOT DETECTED Final   Proteus species NOT DETECTED NOT DETECTED Final   Salmonella species NOT DETECTED NOT DETECTED Final   Serratia marcescens NOT DETECTED NOT DETECTED Final   Haemophilus influenzae NOT DETECTED NOT DETECTED Final   Neisseria meningitidis NOT DETECTED NOT DETECTED Final   Pseudomonas aeruginosa NOT DETECTED NOT DETECTED Final   Stenotrophomonas  maltophilia NOT DETECTED NOT DETECTED Final   Candida albicans NOT DETECTED NOT DETECTED Final   Candida auris NOT DETECTED NOT DETECTED Final   Candida glabrata NOT DETECTED NOT DETECTED Final   Candida krusei NOT DETECTED NOT DETECTED Final   Candida parapsilosis NOT DETECTED NOT DETECTED Final   Candida tropicalis NOT DETECTED NOT DETECTED Final   Cryptococcus neoformans/gattii NOT DETECTED NOT DETECTED Final   Meth resistant mecA/C and MREJ DETECTED (A) NOT DETECTED Final    Comment: CRITICAL RESULT CALLED TO, READ BACK BY AND VERIFIED WITH: J ATAMBILE RN 07/10/2023 @ 2259 BY AB Performed at [facility], [address] [place], [place] [address]     Scheduled Meds:  sodium chloride    Intravenous Once   ascorbic acid  500 mg Oral Daily   benztropine   2 mg Oral BID   [START ON 07/13/2023] vitamin B-12  1,000 mcg Oral Daily   enoxaparin  (LOVENOX ) injection  40 mg Subcutaneous Q24H   feeding supplement  237 mL Oral TID BM   insulin  aspart  0-15 Units Subcutaneous TID WC   insulin  aspart  0-5 Units Subcutaneous QHS   insulin  glargine-yfgn  10 Units Subcutaneous QHS   levothyroxine   75 mcg Oral Q0600   multivitamin with minerals  1 tablet Oral Daily   oxybutynin  5 mg Oral TID   paliperidone   3 mg Oral BID   pantoprazole   40 mg Oral Daily   phosphorus  250 mg Oral TID   polyethylene glycol  17 g Oral BID   QUEtiapine   600 mg Oral QHS   [START ON 07/15/2023] rosuvastatin   5 mg Oral Weekly   senna-docusate  2 tablet Oral QHS   zinc sulfate (50mg  elemental zinc)  220 mg Oral Daily   Continuous Infusions:  vancomycin  1,500 mg (07/12/23 1016)     LOS: 3 days    Colin Dawley M.D on 07/12/2023 at 6:23 PM  Go to [email or web address] - for contact info  [facility] - Office  [phone]  If 7PM-7AM, please contact night-coverage [email or web address] 07/12/2023, 6:23 PM

## 2023-07-13 ENCOUNTER — Other Ambulatory Visit: Payer: Self-pay

## 2023-07-13 DIAGNOSIS — F0390 Unspecified dementia without behavioral disturbance: Secondary | ICD-10-CM

## 2023-07-13 DIAGNOSIS — E871 Hypo-osmolality and hyponatremia: Secondary | ICD-10-CM

## 2023-07-13 DIAGNOSIS — B9562 Methicillin resistant Staphylococcus aureus infection as the cause of diseases classified elsewhere: Secondary | ICD-10-CM

## 2023-07-13 DIAGNOSIS — R7881 Bacteremia: Secondary | ICD-10-CM

## 2023-07-13 DIAGNOSIS — N179 Acute kidney failure, unspecified: Secondary | ICD-10-CM | POA: Diagnosis not present

## 2023-07-13 DIAGNOSIS — R531 Weakness: Secondary | ICD-10-CM | POA: Diagnosis not present

## 2023-07-13 DIAGNOSIS — D649 Anemia, unspecified: Secondary | ICD-10-CM

## 2023-07-13 LAB — BASIC METABOLIC PANEL WITH GFR
Anion gap: 7 (ref 5–15)
BUN: 20 mg/dL (ref 8–23)
CO2: 22 mmol/L (ref 22–32)
Calcium: 7.8 mg/dL — ABNORMAL LOW (ref 8.9–10.3)
Chloride: 103 mmol/L (ref 98–111)
Creatinine, Ser: 1.05 mg/dL (ref 0.61–1.24)
GFR, Estimated: >60 mL/min (ref >60)
Glucose, Bld: 168 mg/dL — ABNORMAL HIGH (ref 70–99)
Potassium: 3.7 mmol/L (ref 3.5–5.1)
Sodium: 134 mmol/L — ABNORMAL LOW (ref 135–145)

## 2023-07-13 LAB — CULTURE, BLOOD (ROUTINE X 2): Special Requests: ADEQUATE

## 2023-07-13 LAB — CBC
HCT: 25.5 % — ABNORMAL LOW (ref 39.0–52.0)
Hemoglobin: 8 g/dL — ABNORMAL LOW (ref 13.0–17.0)
MCH: 26.9 pg (ref 26.0–34.0)
MCHC: 31.4 g/dL (ref 30.0–36.0)
MCV: 85.9 fL (ref 80.0–100.0)
Platelets: 394 10*3/uL (ref 150–400)
RBC: 2.97 MIL/uL — ABNORMAL LOW (ref 4.22–5.81)
RDW: 15.8 % — ABNORMAL HIGH (ref 11.5–15.5)
WBC: 8.7 10*3/uL (ref 4.0–10.5)
nRBC: 0 % (ref 0.0–0.2)

## 2023-07-13 LAB — VANCOMYCIN, PEAK
Vancomycin Pk: 14 ug/mL — ABNORMAL LOW (ref 30–40)
Vancomycin Pk: 41 ug/mL — ABNORMAL HIGH (ref 30–40)

## 2023-07-13 LAB — GLUCOSE, CAPILLARY
Glucose-Capillary: 149 mg/dL — ABNORMAL HIGH (ref 70–99)
Glucose-Capillary: 215 mg/dL — ABNORMAL HIGH (ref 70–99)
Glucose-Capillary: 189 mg/dL — ABNORMAL HIGH (ref 70–99)
Glucose-Capillary: 201 mg/dL — ABNORMAL HIGH (ref 70–99)

## 2023-07-13 LAB — MRSA NEXT GEN BY PCR, NASAL: MRSA by PCR Next Gen: DETECTED — AB

## 2023-07-13 NOTE — Progress Notes (Signed)
 Mobility Specialist Progress Note:    07/13/23 1420  Mobility  Activity Transferred from chair to bed  Level of Assistance Maximum assist, patient does 25-49%  Assistive Device Front wheel walker  Distance Ambulated (ft) 2 ft  Range of Motion/Exercises Active;All extremities  Activity Response Tolerated well  Mobility visit 1 Mobility  Mobility Specialist Start Time (ACUTE ONLY) 1405  Mobility Specialist Stop Time (ACUTE ONLY) 1420  Mobility Specialist Time Calculation (min) (ACUTE ONLY) 15 min   Pt received in chair requesting assistance to bed. Required MaxA to stand and transfer with RW. Tolerated well, asx throughout. Left pt supine, alarm on. All needs met.  Glinda Lapping Mobility Specialist Please contact via Special educational needs teacher or  Rehab office at [phone]

## 2023-07-13 NOTE — Progress Notes (Signed)
 During assessment early this morning, patients left arm was noted to be red and swollen and warm to the touch. Arm is elevated and restricted extremity band applied to left arm for staff to avoid blood draws and IV sticks at this time.

## 2023-07-13 NOTE — Plan of Care (Signed)
  Problem: Acute Rehab PT Goals(only PT should resolve) Goal: Pt Will Go Supine/Side To Sit Outcome: Progressing Flowsheets (Taken 07/13/2023 1454) Pt will go Supine/Side to Sit: with minimal assist Goal: Patient Will Transfer Sit To/From Stand Outcome: Progressing Flowsheets (Taken 07/13/2023 1454) Patient will transfer sit to/from stand: with minimal assist Goal: Pt Will Transfer Bed To Chair/Chair To Bed Outcome: Progressing Flowsheets (Taken 07/13/2023 1454) Pt will Transfer Bed to Chair/Chair to Bed:  with min assist  with mod assist Goal: Pt Will Ambulate Outcome: Progressing Flowsheets (Taken 07/13/2023 1454) Pt will Ambulate:  25 feet  with minimal assist  with moderate assist  with rolling walker   2:54 PM, 07/13/23 Walton Guppy, MPT Physical Therapist with [facility] [phone] office [phone] mobile phone

## 2023-07-13 NOTE — TOC Progression Note (Signed)
 Transition of Care [facility]) - Progression Note    Patient Details  Name: [name] MRN: [id number] Date of Birth: March 06, 1955  Transition of Care [facility]) CM/SW Contact  Cyndie Dredge, [place] Phone Number: 07/13/2023, 1:24 PM  Clinical Narrative:     This Clinical research associate spoke with sister Meredith Stalls about PT recommendation for SNF. Elsie asked why PT could not be setup in the home. Writer shared that it could be arranged and sister agreeable to patient discharging home with [facility]. Sister shared that patient lives with her son and wife and they take care of everything else such as his meals, medication,and wound. Cory with [facility] accepted referral for HHPT. TOC to follow.  Expected Discharge Plan: Home w Home Health Services Barriers to Discharge: Continued Medical Work up  Expected Discharge Plan and Services In-house Referral: Clinical Social Work   Post Acute Care Choice: Durable Medical Equipment Living arrangements for the past 2 months: Single Family Home                 DME Arranged: N/A         HH Arranged: PT, Refused SNF HH Agency: [facility] Date [facility] Agency Contacted: 07/13/23 Time HH Agency Contacted: 1320 Representative spoke with at [facility] Agency: Randel Buss   Social Determinants of Health (SDOH) Interventions SDOH Screenings   Food Insecurity: No Food Insecurity (07/09/2023)  Housing: Low Risk  (07/09/2023)  Transportation Needs: No Transportation Needs (07/09/2023)  Utilities: Not At Risk (07/09/2023)  Financial Resource Strain: Medium Risk (05/19/2022)   Received from [facility]  Social Connections: Unknown (07/10/2023)  Tobacco Use: Medium Risk (07/09/2023)  Health Literacy: High Risk (02/24/2021)   Received from [facility]    Readmission Risk Interventions    07/13/2023    1:18 PM  Readmission Risk Prevention Plan  Transportation Screening Complete  HRI or Home Care Consult Complete  Social Work Consult for Recovery Care  Planning/Counseling Complete  Palliative Care Screening Not Applicable  Medication Review Oceanographer) Complete

## 2023-07-13 NOTE — Evaluation (Signed)
 Physical Therapy Evaluation Patient Details Name: [name] MRN: [id number] DOB: 03-02-1955 Today's Date: 07/13/2023  History of Present Illness  [name] is a 68 y.o. male with medical history significant of dementia, schizophrenia, hypertension, hypothyroidism, diabetes.  Patient is unable able to provide much history due to cognitive decline.  History is obtained by the patient's family who note that he has been appearing a little bit more confused over the last 2 to 3 days.  He has also had decreased appetite.  His decreased appetite has been going on a little bit longer -closer to 2 to 3 weeks.  His sister does note that he has been taking a protein supplement 3 times a day but that his appetite is decreased regardless.  No fevers, chills, nausea, vomiting.  He has been constipated.     He was admitted in the beginning of the month and discharged about 2 weeks ago.  At the time of his discharge, it was recommended that he go to a skilled nursing facility, but his family did not want this.   Clinical Impression  Patient demonstrates slow labored movement for sitting up at bedside, once seated, apprehensive to stand due to fear of falling, limited to a few shaky slow labored shuffling side steps at bedside before having sit due to fatigue and poor standing balance. Patient tolerated sitting up in chair after therapy. Patient will benefit from continued skilled physical therapy in hospital and recommended venue below to increase strength, balance, endurance for safe ADLs and gait.           If plan is discharge home, recommend the following: A lot of help with bathing/dressing/bathroom;A lot of help with walking and/or transfers;Help with stairs or ramp for entrance;Assistance with cooking/housework   Can travel by private vehicle   No    Equipment Recommendations None recommended by PT  Recommendations for Other Services       Functional Status Assessment Patient has had a  recent decline in their functional status and demonstrates the ability to make significant improvements in function in a reasonable and predictable amount of time.     Precautions / Restrictions Precautions Precautions: Fall Recall of Precautions/Restrictions: Impaired Restrictions Weight Bearing Restrictions Per Provider Order: No      Mobility  Bed Mobility Overal bed mobility: Needs Assistance Bed Mobility: Supine to Sit     Supine to sit: Max assist     General bed mobility comments: poor tolerance for pulling self to sitting due to weakness    Transfers Overall transfer level: Needs assistance Equipment used: Rolling walker (2 wheels) Transfers: Sit to/from Stand, Bed to chair/wheelchair/BSC Sit to Stand: Mod assist   Step pivot transfers: Mod assist       General transfer comment: unsteady labored movement requiring repeated verbal/tactile cueing    Ambulation/Gait Ambulation/Gait assistance: Mod assist, Max assist Gait Distance (Feet): 5 Feet   Gait Pattern/deviations: Decreased step length - right, Decreased step length - left, Decreased stride length, Shuffle Gait velocity: slow     General Gait Details: limited to a few slow labored unsteady side steps with mostly shuffling of feet due to weakness  Stairs            Wheelchair Mobility     Tilt Bed    Modified Rankin (Stroke Patients Only)       Balance Overall balance assessment: Needs assistance Sitting-balance support: Feet supported, No upper extremity supported Sitting balance-Leahy Scale: Fair Sitting balance - Comments: fair/good  seated at EOB   Standing balance support: Reliant on assistive device for balance, During functional activity, Bilateral upper extremity supported Standing balance-Leahy Scale: Poor Standing balance comment: using RW                             Pertinent Vitals/Pain Pain Assessment Pain Assessment: Faces Faces Pain Scale: Hurts little  more Pain Location: BLE mostly, right foot Pain Descriptors / Indicators: Sore, Grimacing, Guarding Pain Intervention(s): Limited activity within patient's tolerance, Monitored during session, Premedicated before session, Repositioned    Home Living Family/patient expects to be discharged to:: Private residence Living Arrangements: Other relatives Available Help at Discharge: Friend(s) Type of Home: Apartment Home Access: Stairs to enter Entrance Stairs-Rails: Left;Right;Can reach both Entrance Stairs-Number of Steps: 12   Home Layout: One level Home Equipment: Agricultural consultant (2 wheels);Cane - single point;Shower seat;Grab bars - tub/shower;Grab bars - toilet      Prior Function Prior Level of Function : Needs assist       Physical Assist : ADLs (physical);Mobility (physical) Mobility (physical): Bed mobility;Transfers;Gait;Stairs ADLs (physical): Toileting;IADLs;Bathing Mobility Comments: Household ambulation using RW ADLs Comments: Reports assist for bathing, toileting, and IADL's.     Extremity/Trunk Assessment   Upper Extremity Assessment Upper Extremity Assessment: Generalized weakness    Lower Extremity Assessment Lower Extremity Assessment: Generalized weakness    Cervical / Trunk Assessment Cervical / Trunk Assessment: Kyphotic  Communication   Communication Communication: No apparent difficulties    Cognition Arousal: Alert Behavior During Therapy: WFL for tasks assessed/performed, Anxious, Flat affect   PT - Cognitive impairments: History of cognitive impairments                         Following commands: Intact       Cueing Cueing Techniques: Verbal cues, Tactile cues     General Comments      Exercises     Assessment/Plan    PT Assessment Patient needs continued PT services  PT Problem List Decreased strength;Decreased activity tolerance;Decreased balance;Decreased mobility;Pain       PT Treatment Interventions DME  instruction;Gait training;Stair training;Functional mobility training;Therapeutic activities;Therapeutic exercise;Balance training;Patient/family education    PT Goals (Current goals can be found in the Care Plan section)  Acute Rehab PT Goals Patient Stated Goal: return home PT Goal Formulation: With patient Time For Goal Achievement: 07/27/23 Potential to Achieve Goals: Good    Frequency Min 3X/week     Co-evaluation               AM-PAC PT "6 Clicks" Mobility  Outcome Measure Help needed turning from your back to your side while in a flat bed without using bedrails?: A Lot Help needed moving from lying on your back to sitting on the side of a flat bed without using bedrails?: A Lot Help needed moving to and from a bed to a chair (including a wheelchair)?: A Lot Help needed standing up from a chair using your arms (e.g., wheelchair or bedside chair)?: A Lot Help needed to walk in hospital room?: A Lot Help needed climbing 3-5 steps with a railing? : Total 6 Click Score: 11    End of Session   Activity Tolerance: Patient tolerated treatment well;Patient limited by fatigue Patient left: in chair;with call bell/phone within reach;with chair alarm set Nurse Communication: Mobility status PT Visit Diagnosis: Unsteadiness on feet (R26.81);Other abnormalities of gait and mobility (R26.89);Muscle weakness (generalized) (M62.81)  Time: 1201-1227 PT Time Calculation (min) (ACUTE ONLY): 26 min   Charges:   PT Evaluation $PT Eval Moderate Complexity: 1 Mod PT Treatments $Therapeutic Activity: 23-37 mins PT General Charges $$ ACUTE PT VISIT: 1 Visit         2:53 PM, 07/13/23 Walton Guppy, MPT Physical Therapist with [facility] [phone] office [phone] mobile phone

## 2023-07-13 NOTE — Progress Notes (Cosign Needed Addendum)
   [facility] has been requested to perform a transesophageal echocardiogram on [name] for bacteremia.    PLT 394   Patient declined TEE after explaining procedure, benefits and risk. Patient has history of dementia, paranoid schizophrenia and had issues with AMS this admission. He can not give coherent reason for refusing procedure. His legal guardian/sister is contacted and would prefer to get TEE if possible. Given his total clinical picture, internal medicine will just proceed with antibiotic treatment. Sister will be updated with this decision.   The patient does NOT have any absolute or relative contraindications to a Transesophageal Echocardiogram (TEE).  The patient has: No other conditions that may impact this procedure.   After careful review of history and examination, the risks and benefits of transesophageal echocardiogram have been explained. However, the patient declines to proceed. The procedure will NOT be scheduled.  Signed, Metta Actis, PA-C  07/13/2023 9:17 AM

## 2023-07-13 NOTE — Progress Notes (Signed)
 PROGRESS NOTE  [name], is a 68 y.o. male, DOB - [birth year], ZOX:[id number]  Admit date - 07/09/2023   Admitting Physician No admitting provider for patient encounter.  Outpatient Primary MD for the patient is Clifford Bitters, MD  LOS - 4  Chief Complaint  Patient presents with   Weakness     Brief Narrative:  68 y.o. male with medical history significant of dementia, schizophrenia, hypertension, hypothyroidism, diabetes admitted on 07/09/2023 with AKI, dehydration with Hyponatremia failure to thrive possible UTI and found to have MRSA bacteremia    -Assessment and Plan: 1)MRSA Bacteremia---??  Due to chronic indwelling suprapubic catheter Blood cultures from 07/09/2023 with MRSA - Narrow antibiotics to IV vancomycin  (started on 07/11/2023) - TTE on 07/12/2023 without vegetations - Appreciate assistance by cardiology service; patient has declined TEE - After discussing with ID physician (Dr. Artemio Larry) decision-made to pursued 4 weeks of IV antibiotics using vancomycin . - Will plan for PICC line on 07/14/2023.  2)AKI----acute kidney injury  -  creatinine on admission= 2.09  ,  baseline creatinine = 1.1 on 06/25/2023    ,  creatinine is now= trending down appropriately with hydration, -- Continue to renally adjust medications, avoid nephrotoxic agents / dehydration  / hypotension - Continue to follow renal function trend.  3)Acute on chronic anemia--baseline hemoglobin usually around 8,  - Hgb dow after hydration due to hemodilution -Hgb up to 7.5 from 6.6 after transfusion 1 unit of PRBC on 07/11/2023 - Stable hemoglobin at the moment; continue to follow trend. - No overt bleeding appreciated.  4)DM2-A1C 7.0 -Continue Semglee  10 units nightly Use Novolog /Humalog Sliding scale insulin  with Accu-Cheks/Fingersticks as ordered  - Continue modified carbohydrate diet.  5)Hyponatremia--up to 131 from 124 after IV fluids - Continue to follow electrolytes trend.  6)Paranoid  schizophrenia/dementia --- stable, continue Cogentin  along with Seroquel  and Invega . - Patient was seen by his primary psychiatrist within a week prior to this admission. - Overall condition stable.  7)Generalized weakness and deconditioning----ambulatory dysfunction  - Please get PT eval prior to discharge - Family open home with taking patient back home with home health services as long as he is ambulatory.  8) chronic indwelling suprapubic catheter--- - Suprapubic catheter last changed 06/30/2023--per Dr. Claretta Croft (Urology) no indication to change catheter - Catheter is leaking--Dr. Claretta Croft says no need to change he recommends oxybutynin for presumed bladder spasms instead - Outpatient follow-up with urology service will be arranged; discharged with Foley catheter in place.  9) hypothyroidism-- - continue levothyroxine   10) GERD- -continue Protonix   11) HLD- -continue Crestor   Status is: Inpatient   Disposition: The patient is from: Home              Anticipated d/c is to: Home              Anticipated d/c date is: 2 days              Patient currently is not medically stable to d/c.  Barriers: Not Clinically Stable-   Code Status :  -  Code Status: Full Code   Family Communication:    (patient is alert, awake and coherent)  Nephew-- [name] [phone] and Sister [name]  [phone] are primary contact---  DVT Prophylaxis  :   - SCDs  enoxaparin  (LOVENOX ) injection 40 mg Start: 07/09/23 2200   Lab Results  Component Value Date   PLT 394 07/13/2023   Inpatient Medications  Scheduled Meds:  sodium chloride    Intravenous Once  ascorbic acid  500 mg Oral Daily   benztropine   2 mg Oral BID   vitamin B-12  1,000 mcg Oral Daily   enoxaparin  (LOVENOX ) injection  40 mg Subcutaneous Q24H   feeding supplement  237 mL Oral TID BM   insulin  aspart  0-15 Units Subcutaneous TID WC   insulin  aspart  0-5 Units Subcutaneous QHS   insulin  glargine-yfgn  10 Units  Subcutaneous QHS   levothyroxine   75 mcg Oral Q0600   multivitamin with minerals  1 tablet Oral Daily   oxybutynin  5 mg Oral TID   paliperidone   3 mg Oral BID   pantoprazole   40 mg Oral Daily   phosphorus  250 mg Oral TID   polyethylene glycol  17 g Oral BID   QUEtiapine   600 mg Oral QHS   [START ON 07/15/2023] rosuvastatin   5 mg Oral Weekly   senna-docusate  2 tablet Oral QHS   zinc sulfate (50mg  elemental zinc)  220 mg Oral Daily   Continuous Infusions:  vancomycin  1,500 mg (07/13/23 1044)   PRN Meds:.acetaminophen  **OR** acetaminophen , HYDROcodone-acetaminophen , oxybutynin   Anti-infectives (From admission, onward)    Start     Dose/Rate Route Frequency Ordered Stop   07/12/23 0900  vancomycin  (VANCOREADY) IVPB 1500 mg/300 mL        1,500 mg 150 mL/hr over 120 Minutes Intravenous Every 24 hours 07/11/23 0819     07/11/23 0800  vancomycin  (VANCOREADY) IVPB 1750 mg/350 mL        1,750 mg 175 mL/hr over 120 Minutes Intravenous  Once 07/11/23 0733 07/11/23 1105   07/09/23 1600  cefTRIAXone  (ROCEPHIN ) 1 g in sodium chloride  0.9 % 100 mL IVPB  Status:  Discontinued        1 g 200 mL/hr over 30 Minutes Intravenous Every 24 hours 07/09/23 1551 07/11/23 1436       Subjective: [name] afebrile, no chest pain, no nausea vomiting.  No acute distress.  Patient is present no interest in TEE.   Objective: Vitals:   07/12/23 2028 07/13/23 0401 07/13/23 0441 07/13/23 1333  BP: 120/61 (!) 145/71  131/63  Pulse: 93 98    Resp: 19 18  18   Temp: 97.9 F (36.6 C) 97.7 F (36.5 C) 97.7 F (36.5 C) 97.8 F (36.6 C)  TempSrc:  Oral Oral   SpO2: 97% 96%  96%  Weight:      Height:        Intake/Output Summary (Last 24 hours) at 07/13/2023 1846 Last data filed at 07/13/2023 1300 Gross per 24 hour  Intake 780 ml  Output --  Net 780 ml   Filed Weights   07/09/23 1107  Weight: 87 kg    Physical Exam General exam: Alert, awake, oriented x 2 intermittently; in no acute  distress.  Afebrile Respiratory system: Clear to auscultation. Respiratory effort normal.  Good saturation on room air. Cardiovascular system:RRR. No rubs or gallops; no JVD. Gastrointestinal system: Abdomen is nondistended, soft and nontender. No organomegaly or masses felt. Normal bowel sounds heard. Central nervous system: Generally weak.  No focal neurological deficits. Extremities: No cyanosis or clubbing. Skin: No petechiae; suprapubic catheter in place. Psychiatry: Flat affect appreciated on exam.  Data Reviewed: I have personally reviewed following labs and imaging studies  CBC: Recent Labs  Lab 07/09/23 1314 07/10/23 0342 07/11/23 0404 07/11/23 0803 07/12/23 0409 07/13/23 0500  WBC 10.9* 8.9 9.4  --  9.6 8.7  NEUTROABS 9.3*  --   --   --   --   --  HGB 7.6* 7.0* 6.5* 6.6* 7.5* 8.0*  HCT 23.8* 21.6* 20.9* 19.9* 23.5* 25.5*  MCV 86.9 85.7 85.3  --  86.1 85.9  PLT 412* 387 351  --  380 394   Basic Metabolic Panel: Recent Labs  Lab 07/09/23 1314 07/10/23 0342 07/11/23 0404 07/13/23 0500  NA 124* 128* 131* 134*  K 4.5 4.2 3.7 3.7  CL 94* 101 105 103  CO2 19* 21* 23 22  GLUCOSE 228* 147* 168* 168*  BUN 36* 28* 22 20  CREATININE 2.09* 1.57* 1.19 1.05  CALCIUM  7.9* 7.7* 7.8* 7.8*  PHOS  --   --  2.5  --    GFR: Estimated Creatinine Clearance: 70.5 mL/min (by C-G formula based on SCr of 1.05 mg/dL).  Liver Function Tests: Recent Labs  Lab 07/09/23 1314 07/11/23 0404  AST 26  --   ALT 20  --   ALKPHOS 105  --   BILITOT 0.9  --   PROT 7.9  --   ALBUMIN  1.7* <1.5*   Cardiac Enzymes: Recent Labs  Lab 07/09/23 1314  CKTOTAL 113   Recent Results (from the past 240 hours)  Resp panel by RT-PCR (RSV, Flu A&B, Covid) Urine, Clean Catch     Status: None   Collection Time: 07/09/23 12:52 PM   Specimen: Urine, Clean Catch; Nasal Swab  Result Value Ref Range Status   SARS Coronavirus 2 by RT PCR NEGATIVE NEGATIVE Final    Comment: (NOTE) SARS-CoV-2 target  nucleic acids are NOT DETECTED.  The SARS-CoV-2 RNA is generally detectable in upper respiratory specimens during the acute phase of infection. The lowest concentration of SARS-CoV-2 viral copies this assay can detect is 138 copies/mL. A negative result does not preclude SARS-Cov-2 infection and should not be used as the sole basis for treatment or other patient management decisions. A negative result may occur with  improper specimen collection/handling, submission of specimen other than nasopharyngeal swab, presence of viral mutation(s) within the areas targeted by this assay, and inadequate number of viral copies(<138 copies/mL). A negative result must be combined with clinical observations, patient history, and epidemiological information. The expected result is Negative.  Fact Sheet for Patients:  BloggerCourse.com  Fact Sheet for Healthcare Providers:  SeriousBroker.it  This test is no t yet approved or cleared by the [place]  FDA and  has been authorized for detection and/or diagnosis of SARS-CoV-2 by FDA under an Emergency Use Authorization (EUA). This EUA will remain  in effect (meaning this test can be used) for the duration of the COVID-19 declaration under Section 564(b)(1) of the Act, 21 U.S.C.section 360bbb-3(b)(1), unless the authorization is terminated  or revoked sooner.       Influenza A by PCR NEGATIVE NEGATIVE Final   Influenza B by PCR NEGATIVE NEGATIVE Final    Comment: (NOTE) The Xpert Xpress SARS-CoV-2/FLU/RSV plus assay is intended as an aid in the diagnosis of influenza from Nasopharyngeal swab specimens and should not be used as a sole basis for treatment. Nasal washings and aspirates are unacceptable for Xpert Xpress SARS-CoV-2/FLU/RSV testing.  Fact Sheet for Patients: BloggerCourse.com  Fact Sheet for Healthcare  Providers: SeriousBroker.it  This test is not yet approved or cleared by the [place]  FDA and has been authorized for detection and/or diagnosis of SARS-CoV-2 by FDA under an Emergency Use Authorization (EUA). This EUA will remain in effect (meaning this test can be used) for the duration of the COVID-19 declaration under Section 564(b)(1) of the Act, 21 U.S.C. section 360bbb-3(b)(1),  unless the authorization is terminated or revoked.     Resp Syncytial Virus by PCR NEGATIVE NEGATIVE Final    Comment: (NOTE) Fact Sheet for Patients: BloggerCourse.com  Fact Sheet for Healthcare Providers: SeriousBroker.it  This test is not yet approved or cleared by the [place]  FDA and has been authorized for detection and/or diagnosis of SARS-CoV-2 by FDA under an Emergency Use Authorization (EUA). This EUA will remain in effect (meaning this test can be used) for the duration of the COVID-19 declaration under Section 564(b)(1) of the Act, 21 U.S.C. section 360bbb-3(b)(1), unless the authorization is terminated or revoked.  Performed at [facility], [address], [place], [place] [address]   Urine Culture     Status: Abnormal   Collection Time: 07/09/23 12:52 PM   Specimen: Urine, Random  Result Value Ref Range Status   Specimen Description   Final    URINE, RANDOM Performed at [facility], [address], [place], [place] [address]    Special Requests   Final    NONE Reflexed from [id number] Performed at [facility], [address], [place], [place] [address]    Culture MULTIPLE SPECIES PRESENT, SUGGEST RECOLLECTION (A)  Final   Report Status 07/10/2023 FINAL  Final  Blood Culture (routine x 2)     Status: Abnormal   Collection Time: 07/09/23  1:14 PM   Specimen: Right Antecubital; Blood  Result Value Ref Range Status   Specimen Description   Final    RIGHT ANTECUBITAL BOTTLES DRAWN AEROBIC AND  ANAEROBIC Performed at [facility], [address] [place], [place] [address]    Special Requests   Final    Blood Culture adequate volume Performed at [facility], [address] [place], [place] [address]    Culture  Setup Time   Final    IN BOTH AEROBIC AND ANAEROBIC BOTTLES GRAM POSITIVE COCCI Gram Stain Report Called to,Read Back By and Verified With: J MAKAR AT 0836 07/11/23 BY A WILSON    Culture (A)  Final    STAPHYLOCOCCUS AUREUS SUSCEPTIBILITIES PERFORMED ON PREVIOUS CULTURE WITHIN THE LAST 5 DAYS. Performed at [facility], [address] [place], [place] [address]    Report Status 07/13/2023 FINAL  Final  Blood Culture (routine x 2)     Status: Abnormal   Collection Time: 07/09/23  1:14 PM   Specimen: BLOOD RIGHT HAND  Result Value Ref Range Status   Specimen Description   Final    BLOOD RIGHT HAND BOTTLES DRAWN AEROBIC AND ANAEROBIC Performed at [facility], [address], [place], [place] [address]    Special Requests   Final    Blood Culture adequate volume Performed at [facility], [address], [place], [place] [address]    Culture  Setup Time   Final    GRAM POSITIVE COCCI IN BOTH AEROBIC AND ANAEROBIC BOTTLES Gram Stain Report Called to,Read Back By and Verified With: T LENARD AT 1350 ON [id number] BY S DALTON CRITICAL RESULT CALLED TO, READ BACK BY AND VERIFIED WITH: J ATAMBILE RN 07/10/2023 @ 2259 BY AB Performed at [facility], [address] [place], [place] [address]    Culture METHICILLIN RESISTANT STAPHYLOCOCCUS AUREUS (A)  Final   Report Status 07/12/2023 FINAL  Final   Organism ID, Bacteria METHICILLIN RESISTANT STAPHYLOCOCCUS AUREUS  Final      Susceptibility   Methicillin resistant staphylococcus aureus - MIC*    CIPROFLOXACIN  <=0.5 SENSITIVE Sensitive     ERYTHROMYCIN >=8  RESISTANT Resistant     GENTAMICIN <=0.5 SENSITIVE Sensitive     OXACILLIN >=4 RESISTANT Resistant     TETRACYCLINE <=1 SENSITIVE Sensitive      VANCOMYCIN  1 SENSITIVE Sensitive     TRIMETH /SULFA  <=10 SENSITIVE Sensitive     CLINDAMYCIN <=0.25 SENSITIVE Sensitive     RIFAMPIN <=0.5 SENSITIVE Sensitive     Inducible Clindamycin NEGATIVE Sensitive     LINEZOLID 2 SENSITIVE Sensitive     * METHICILLIN RESISTANT STAPHYLOCOCCUS AUREUS  Blood Culture ID Panel (Reflexed)     Status: Abnormal   Collection Time: 07/09/23  1:14 PM  Result Value Ref Range Status   Enterococcus faecalis NOT DETECTED NOT DETECTED Final   Enterococcus Faecium NOT DETECTED NOT DETECTED Final   Listeria monocytogenes NOT DETECTED NOT DETECTED Final   Staphylococcus species DETECTED (A) NOT DETECTED Final    Comment: CRITICAL RESULT CALLED TO, READ BACK BY AND VERIFIED WITH: J ATAMBILE RN 07/10/2023 @ 2259 BY AB    Staphylococcus aureus (BCID) DETECTED (A) NOT DETECTED Final    Comment: Methicillin (oxacillin)-resistant Staphylococcus aureus (MRSA). MRSA is predictably resistant to beta-lactam antibiotics (except ceftaroline). Preferred therapy is vancomycin  unless clinically contraindicated. Patient requires contact precautions if  hospitalized. CRITICAL RESULT CALLED TO, READ BACK BY AND VERIFIED WITH: J ATAMBILE RN 07/10/2023 @ 2259 BY AB    Staphylococcus epidermidis NOT DETECTED NOT DETECTED Final   Staphylococcus lugdunensis NOT DETECTED NOT DETECTED Final   Streptococcus species NOT DETECTED NOT DETECTED Final   Streptococcus agalactiae NOT DETECTED NOT DETECTED Final   Streptococcus pneumoniae NOT DETECTED NOT DETECTED Final   Streptococcus pyogenes NOT DETECTED NOT DETECTED Final   A.calcoaceticus-baumannii NOT DETECTED NOT DETECTED Final   Bacteroides fragilis NOT DETECTED NOT DETECTED Final   Enterobacterales NOT DETECTED NOT DETECTED Final   Enterobacter cloacae complex NOT DETECTED NOT DETECTED Final   Escherichia coli NOT DETECTED NOT DETECTED Final   Klebsiella aerogenes NOT DETECTED NOT DETECTED Final   Klebsiella oxytoca NOT DETECTED NOT  DETECTED Final   Klebsiella pneumoniae NOT DETECTED NOT DETECTED Final   Proteus species NOT DETECTED NOT DETECTED Final   Salmonella species NOT DETECTED NOT DETECTED Final   Serratia marcescens NOT DETECTED NOT DETECTED Final   Haemophilus influenzae NOT DETECTED NOT DETECTED Final   Neisseria meningitidis NOT DETECTED NOT DETECTED Final   Pseudomonas aeruginosa NOT DETECTED NOT DETECTED Final   Stenotrophomonas maltophilia NOT DETECTED NOT DETECTED Final   Candida albicans NOT DETECTED NOT DETECTED Final   Candida auris NOT DETECTED NOT DETECTED Final   Candida glabrata NOT DETECTED NOT DETECTED Final   Candida krusei NOT DETECTED NOT DETECTED Final   Candida parapsilosis NOT DETECTED NOT DETECTED Final   Candida tropicalis NOT DETECTED NOT DETECTED Final   Cryptococcus neoformans/gattii NOT DETECTED NOT DETECTED Final   Meth resistant mecA/C and MREJ DETECTED (A) NOT DETECTED Final    Comment: CRITICAL RESULT CALLED TO, READ BACK BY AND VERIFIED WITH: J ATAMBILE RN 07/10/2023 @ 2259 BY AB Performed at [facility], [address], [place], [place] [address]   MRSA Next Gen by PCR, Nasal     Status: Abnormal   Collection Time: 07/13/23  1:28 PM   Specimen: Nasal Mucosa; Nasal Swab  Result Value Ref Range Status   MRSA by PCR Next Gen DETECTED (A) NOT DETECTED Final    Comment: RESULT CALLED TO, READ BACK BY AND VERIFIED WITH: L CLARKSON AT 1627 07/13/23 BY A WILSON (NOTE) The GeneXpert  MRSA Assay (FDA approved for NASAL specimens only), is one component of a comprehensive MRSA colonization surveillance program. It is not intended to diagnose MRSA infection nor to guide or monitor treatment for MRSA infections. Test performance is not FDA approved in patients less than 95 years old. Performed at [facility], [address] [place], [place] [address]     Scheduled Meds:  sodium chloride    Intravenous Once   ascorbic acid  500 mg Oral Daily   benztropine   2 mg Oral BID    vitamin B-12  1,000 mcg Oral Daily   enoxaparin  (LOVENOX ) injection  40 mg Subcutaneous Q24H   feeding supplement  237 mL Oral TID BM   insulin  aspart  0-15 Units Subcutaneous TID WC   insulin  aspart  0-5 Units Subcutaneous QHS   insulin  glargine-yfgn  10 Units Subcutaneous QHS   levothyroxine   75 mcg Oral Q0600   multivitamin with minerals  1 tablet Oral Daily   oxybutynin  5 mg Oral TID   paliperidone   3 mg Oral BID   pantoprazole   40 mg Oral Daily   phosphorus  250 mg Oral TID   polyethylene glycol  17 g Oral BID   QUEtiapine   600 mg Oral QHS   [START ON 07/15/2023] rosuvastatin   5 mg Oral Weekly   senna-docusate  2 tablet Oral QHS   zinc sulfate (50mg  elemental zinc)  220 mg Oral Daily   Continuous Infusions:  vancomycin  1,500 mg (07/13/23 1044)     LOS: 4 days    Justina Oman M.D on 07/13/2023  Go to [email or web address] - for contact info  [facility] - Office  [phone]  If 7PM-7AM, please contact night-coverage [email or web address] 07/13/2023, 6:46 PM

## 2023-07-14 DIAGNOSIS — R531 Weakness: Secondary | ICD-10-CM | POA: Diagnosis not present

## 2023-07-14 DIAGNOSIS — N179 Acute kidney failure, unspecified: Secondary | ICD-10-CM | POA: Diagnosis not present

## 2023-07-14 DIAGNOSIS — F0390 Unspecified dementia without behavioral disturbance: Secondary | ICD-10-CM | POA: Diagnosis not present

## 2023-07-14 DIAGNOSIS — D649 Anemia, unspecified: Secondary | ICD-10-CM | POA: Diagnosis not present

## 2023-07-14 LAB — BASIC METABOLIC PANEL WITH GFR
Anion gap: 10 (ref 5–15)
BUN: 22 mg/dL (ref 8–23)
CO2: 20 mmol/L — ABNORMAL LOW (ref 22–32)
Calcium: 8.1 mg/dL — ABNORMAL LOW (ref 8.9–10.3)
Chloride: 101 mmol/L (ref 98–111)
Creatinine, Ser: 1.2 mg/dL (ref 0.61–1.24)
GFR, Estimated: >60 mL/min (ref >60)
Glucose, Bld: 238 mg/dL — ABNORMAL HIGH (ref 70–99)
Potassium: 4.2 mmol/L (ref 3.5–5.1)
Sodium: 131 mmol/L — ABNORMAL LOW (ref 135–145)

## 2023-07-14 LAB — GLUCOSE, CAPILLARY
Glucose-Capillary: 157 mg/dL — ABNORMAL HIGH (ref 70–99)
Glucose-Capillary: 255 mg/dL — ABNORMAL HIGH (ref 70–99)
Glucose-Capillary: 124 mg/dL — ABNORMAL HIGH (ref 70–99)
Glucose-Capillary: 168 mg/dL — ABNORMAL HIGH (ref 70–99)

## 2023-07-14 LAB — VANCOMYCIN, TROUGH: Vancomycin Tr: 18 ug/mL (ref 15–20)

## 2023-07-14 MED ORDER — DAPTOMYCIN-SODIUM CHLORIDE 700-0.9 MG/100ML-% IV SOLN
8.0000 mg/kg | Freq: Every day | INTRAVENOUS | Status: DC
  Administered 2023-07-15: 700 mg via INTRAVENOUS
  Filled 2023-07-14 (×2): qty 100

## 2023-07-14 MED ORDER — MUPIROCIN 2 % EX OINT
1.0000 | TOPICAL_OINTMENT | Freq: Two times a day (BID) | CUTANEOUS | Status: DC
  Administered 2023-07-14 – 2023-07-15 (×3): 1 via NASAL
  Filled 2023-07-14: qty 22

## 2023-07-14 MED ORDER — CHLORHEXIDINE GLUCONATE CLOTH 2 % EX PADS
6.0000 | MEDICATED_PAD | Freq: Every day | CUTANEOUS | Status: DC
  Administered 2023-07-14 – 2023-07-15 (×2): 6 via TOPICAL

## 2023-07-14 MED ORDER — PALIPERIDONE ER 3 MG PO TB24
3.0000 mg | ORAL_TABLET | Freq: Two times a day (BID) | ORAL | Status: DC
  Administered 2023-07-14 – 2023-07-15 (×3): 3 mg via ORAL
  Filled 2023-07-14 (×5): qty 1

## 2023-07-14 NOTE — Progress Notes (Signed)
 PROGRESS NOTE  [name], is a 68 y.o. male, DOB - October 11, 1955, ZOX:[id number]  Admit date - 07/09/2023   Admitting Physician No admitting provider for patient encounter.  Outpatient Primary MD for the patient is [name], MD  LOS - 5  Chief Complaint  Patient presents with   Weakness     Brief Narrative:  68 y.o. male with medical history significant of dementia, schizophrenia, hypertension, hypothyroidism, diabetes admitted on 07/09/2023 with AKI, dehydration with Hyponatremia failure to thrive possible UTI and found to have MRSA bacteremia    -Assessment and Plan: 1)MRSA Bacteremia---??  Due to chronic indwelling suprapubic catheter Blood cultures from 07/09/2023 with MRSA - Narrow antibiotics to IV vancomycin  (started on 07/11/2023) - TTE on 07/12/2023 without vegetations - Appreciate assistance by cardiology service; patient has declined TEE - After discussing with ID physician (Dr. Artemio Larry) decision-made to pursued 4 weeks of IV antibiotics using daptomycin every 24 hours. - PICC line ordered in place - Pharmacist to help with OPAT. - Hopefully able to be discharged home on 07/15/2023.  2)AKI----acute kidney injury  -  creatinine on admission= 2.09  ,  baseline creatinine = 1.1 on 06/25/2023    ,  creatinine is now= trending down appropriately with hydration, -- Continue to renally adjust medications, avoid nephrotoxic agents / dehydration  / hypotension - Continue to follow renal function trend.  3)Acute on chronic anemia--baseline hemoglobin usually around 8,  - Hgb dow after hydration due to hemodilution -Hgb up to 7.5 from 6.6 after transfusion 1 unit of PRBC on 07/11/2023 - Stable hemoglobin at the moment; continue to follow trend. - No overt bleeding appreciated.  4)DM2-A1C 7.0 -Continue Semglee  10 units nightly Use Novolog /Humalog Sliding scale insulin  with Accu-Cheks/Fingersticks as ordered  - Continue modified carbohydrate diet.  5)Hyponatremia--up to 131  from 124 after IV fluids - Continue to follow electrolytes trend.  6)Paranoid schizophrenia/dementia --- stable, continue Cogentin  along with Seroquel  and Invega . - Patient was seen by his primary psychiatrist within a week prior to this admission. - Overall condition stable.  7)Generalized weakness and deconditioning----ambulatory dysfunction  - Please get PT eval prior to discharge - Family open home with taking patient back home with home health services as long as he is ambulatory.  8) chronic indwelling suprapubic catheter--- - Suprapubic catheter last changed 06/30/2023--per Dr. Claretta Croft (Urology) no indication to change catheter - Catheter is leaking--Dr. Claretta Croft says no need to change he recommends oxybutynin for presumed bladder spasms instead - Outpatient follow-up with urology service will be arranged; discharged with Foley catheter in place.  9) hypothyroidism-- - continue levothyroxine   10) GERD- -continue Protonix   11) HLD- -continue Crestor   Status is: Inpatient   Disposition: The patient is from: Home              Anticipated d/c is to: Home              Anticipated d/c date is: 2 days              Patient currently is not medically stable to d/c.  Barriers: Not Clinically Stable-   Code Status :  -  Code Status: Full Code   Family Communication:    (patient is alert, awake and coherent)  Nephew-- [name] [phone] and Sister Dariel Betzer  [phone] are primary contact---  DVT Prophylaxis  :   - SCDs  enoxaparin  (LOVENOX ) injection 40 mg Start: 07/09/23 2200   Lab Results  Component Value Date   PLT 394  07/13/2023   Inpatient Medications  Scheduled Meds:  sodium chloride    Intravenous Once   ascorbic acid  500 mg Oral Daily   benztropine   2 mg Oral BID   Chlorhexidine  Gluconate Cloth  6 each Topical Daily   vitamin B-12  1,000 mcg Oral Daily   enoxaparin  (LOVENOX ) injection  40 mg Subcutaneous Q24H   feeding supplement  237 mL Oral TID  BM   insulin  aspart  0-15 Units Subcutaneous TID WC   insulin  aspart  0-5 Units Subcutaneous QHS   insulin  glargine-yfgn  10 Units Subcutaneous QHS   levothyroxine   75 mcg Oral Q0600   multivitamin with minerals  1 tablet Oral Daily   mupirocin ointment  1 Application Nasal BID   oxybutynin  5 mg Oral TID   paliperidone   3 mg Oral BID   pantoprazole   40 mg Oral Daily   phosphorus  250 mg Oral TID   polyethylene glycol  17 g Oral BID   QUEtiapine   600 mg Oral QHS   [START ON 07/15/2023] rosuvastatin   5 mg Oral Weekly   senna-docusate  2 tablet Oral QHS   zinc sulfate (50mg  elemental zinc)  220 mg Oral Daily   Continuous Infusions:  [START ON 07/15/2023] DAPTOmycin     PRN Meds:.acetaminophen  **OR** acetaminophen , HYDROcodone-acetaminophen , oxybutynin   Anti-infectives (From admission, onward)    Start     Dose/Rate Route Frequency Ordered Stop   07/15/23 1000  DAPTOmycin (CUBICIN) IVPB 700 mg/170mL premix        8 mg/kg  87 kg 200 mL/hr over 30 Minutes Intravenous Daily 07/14/23 1546     07/12/23 0900  vancomycin  (VANCOREADY) IVPB 1500 mg/300 mL  Status:  Discontinued        1,500 mg 150 mL/hr over 120 Minutes Intravenous Every 24 hours 07/11/23 0819 07/14/23 1546   07/11/23 0800  vancomycin  (VANCOREADY) IVPB 1750 mg/350 mL        1,750 mg 175 mL/hr over 120 Minutes Intravenous  Once 07/11/23 0733 07/11/23 1105   07/09/23 1600  cefTRIAXone  (ROCEPHIN ) 1 g in sodium chloride  0.9 % 100 mL IVPB  Status:  Discontinued        1 g 200 mL/hr over 30 Minutes Intravenous Every 24 hours 07/09/23 1551 07/11/23 1436       Subjective: [name] no fever, no chest pain, no nausea, no vomiting, no shortness of breath.  Good saturation on room air.  No overnight events reported.   Objective: Vitals:   07/13/23 1333 07/13/23 2006 07/14/23 0417 07/14/23 1341  BP: 131/63 128/71 (!) 103/52 112/67  Pulse:  98 93 98  Resp: 18 20 16    Temp: 97.8 F (36.6 C) (!) 97.5 F (36.4 C) 99.1 F  (37.3 C) 97.7 F (36.5 C)  TempSrc:  Oral Oral Oral  SpO2: 96% 97% 97% 97%  Weight:      Height:        Intake/Output Summary (Last 24 hours) at 07/14/2023 1801 Last data filed at 07/14/2023 1637 Gross per 24 hour  Intake 480 ml  Output 175 ml  Net 305 ml   Filed Weights   07/09/23 1107  Weight: 87 kg    Physical Exam General exam: In no acute distress; no overnight events.  Afebrile. Respiratory system: Clear to auscultation. Respiratory effort normal.  Good saturation on room air. Cardiovascular system: No rubs or gallops; no JVD. Gastrointestinal system: Abdomen is nondistended, soft and nontender.  Positive bowel sounds appreciated.  Suprapubic catheter  in place. Central nervous system: Moving 4 limbs spontaneously.  No focal neurological deficits. Extremities: No cyanosis or clubbing. Skin: No petechiae. Psychiatry: Flat affect appreciated on exam.  Data Reviewed: I have personally reviewed following labs and imaging studies  CBC: Recent Labs  Lab 07/09/23 1314 07/10/23 0342 07/11/23 0404 07/11/23 0803 07/12/23 0409 07/13/23 0500  WBC 10.9* 8.9 9.4  --  9.6 8.7  NEUTROABS 9.3*  --   --   --   --   --   HGB 7.6* 7.0* 6.5* 6.6* 7.5* 8.0*  HCT 23.8* 21.6* 20.9* 19.9* 23.5* 25.5*  MCV 86.9 85.7 85.3  --  86.1 85.9  PLT 412* 387 351  --  380 394   Basic Metabolic Panel: Recent Labs  Lab 07/09/23 1314 07/10/23 0342 07/11/23 0404 07/13/23 0500 07/14/23 1013  NA 124* 128* 131* 134* 131*  K 4.5 4.2 3.7 3.7 4.2  CL 94* 101 105 103 101  CO2 19* 21* 23 22 20*  GLUCOSE 228* 147* 168* 168* 238*  BUN 36* 28* 22 20 22   CREATININE 2.09* 1.57* 1.19 1.05 1.20  CALCIUM  7.9* 7.7* 7.8* 7.8* 8.1*  PHOS  --   --  2.5  --   --    GFR: Estimated Creatinine Clearance: 61.7 mL/min (by C-G formula based on SCr of 1.2 mg/dL).  Liver Function Tests: Recent Labs  Lab 07/09/23 1314 07/11/23 0404  AST 26  --   ALT 20  --   ALKPHOS 105  --   BILITOT 0.9  --   PROT 7.9   --   ALBUMIN  1.7* <1.5*   Cardiac Enzymes: Recent Labs  Lab 07/09/23 1314  CKTOTAL 113   Recent Results (from the past 240 hours)  Resp panel by RT-PCR (RSV, Flu A&B, Covid) Urine, Clean Catch     Status: None   Collection Time: 07/09/23 12:52 PM   Specimen: Urine, Clean Catch; Nasal Swab  Result Value Ref Range Status   SARS Coronavirus 2 by RT PCR NEGATIVE NEGATIVE Final    Comment: (NOTE) SARS-CoV-2 target nucleic acids are NOT DETECTED.  The SARS-CoV-2 RNA is generally detectable in upper respiratory specimens during the acute phase of infection. The lowest concentration of SARS-CoV-2 viral copies this assay can detect is 138 copies/mL. A negative result does not preclude SARS-Cov-2 infection and should not be used as the sole basis for treatment or other patient management decisions. A negative result may occur with  improper specimen collection/handling, submission of specimen other than nasopharyngeal swab, presence of viral mutation(s) within the areas targeted by this assay, and inadequate number of viral copies(<138 copies/mL). A negative result must be combined with clinical observations, patient history, and epidemiological information. The expected result is Negative.  Fact Sheet for Patients:  BloggerCourse.com  Fact Sheet for Healthcare Providers:  SeriousBroker.it  This test is no t yet approved or cleared by the [place]  FDA and  has been authorized for detection and/or diagnosis of SARS-CoV-2 by FDA under an Emergency Use Authorization (EUA). This EUA will remain  in effect (meaning this test can be used) for the duration of the COVID-19 declaration under Section 564(b)(1) of the Act, 21 U.S.C.section 360bbb-3(b)(1), unless the authorization is terminated  or revoked sooner.       Influenza A by PCR NEGATIVE NEGATIVE Final   Influenza B by PCR NEGATIVE NEGATIVE Final    Comment: (NOTE) The Xpert  Xpress SARS-CoV-2/FLU/RSV plus assay is intended as an aid in the diagnosis of influenza  from Nasopharyngeal swab specimens and should not be used as a sole basis for treatment. Nasal washings and aspirates are unacceptable for Xpert Xpress SARS-CoV-2/FLU/RSV testing.  Fact Sheet for Patients: BloggerCourse.com  Fact Sheet for Healthcare Providers: SeriousBroker.it  This test is not yet approved or cleared by the [place]  FDA and has been authorized for detection and/or diagnosis of SARS-CoV-2 by FDA under an Emergency Use Authorization (EUA). This EUA will remain in effect (meaning this test can be used) for the duration of the COVID-19 declaration under Section 564(b)(1) of the Act, 21 U.S.C. section 360bbb-3(b)(1), unless the authorization is terminated or revoked.     Resp Syncytial Virus by PCR NEGATIVE NEGATIVE Final    Comment: (NOTE) Fact Sheet for Patients: BloggerCourse.com  Fact Sheet for Healthcare Providers: SeriousBroker.it  This test is not yet approved or cleared by the [place]  FDA and has been authorized for detection and/or diagnosis of SARS-CoV-2 by FDA under an Emergency Use Authorization (EUA). This EUA will remain in effect (meaning this test can be used) for the duration of the COVID-19 declaration under Section 564(b)(1) of the Act, 21 U.S.C. section 360bbb-3(b)(1), unless the authorization is terminated or revoked.  Performed at [facility], [address] [place], [place] [address]   Urine Culture     Status: Abnormal   Collection Time: 07/09/23 12:52 PM   Specimen: Urine, Random  Result Value Ref Range Status   Specimen Description   Final    URINE, RANDOM Performed at [facility], [address] [place], [place] [address]    Special Requests   Final    NONE Reflexed from [id number] Performed at [facility], [address],  [place], [place] [address]    Culture MULTIPLE SPECIES PRESENT, SUGGEST RECOLLECTION (A)  Final   Report Status 07/10/2023 FINAL  Final  Blood Culture (routine x 2)     Status: Abnormal   Collection Time: 07/09/23  1:14 PM   Specimen: Right Antecubital; Blood  Result Value Ref Range Status   Specimen Description   Final    RIGHT ANTECUBITAL BOTTLES DRAWN AEROBIC AND ANAEROBIC Performed at [facility], [address] [place], [place] [address]    Special Requests   Final    Blood Culture adequate volume Performed at [facility], [address], [place], [place] [address]    Culture  Setup Time   Final    IN BOTH AEROBIC AND ANAEROBIC BOTTLES GRAM POSITIVE COCCI Gram Stain Report Called to,Read Back By and Verified With: J MAKAR AT 0836 07/11/23 BY A WILSON    Culture (A)  Final    STAPHYLOCOCCUS AUREUS SUSCEPTIBILITIES PERFORMED ON PREVIOUS CULTURE WITHIN THE LAST 5 DAYS. Performed at [facility], [address], [place], [place] [address]    Report Status 07/13/2023 FINAL  Final  Blood Culture (routine x 2)     Status: Abnormal (Preliminary result)   Collection Time: 07/09/23  1:14 PM   Specimen: BLOOD RIGHT HAND  Result Value Ref Range Status   Specimen Description   Final    BLOOD RIGHT HAND BOTTLES DRAWN AEROBIC AND ANAEROBIC Performed at [facility], [address], [place], [place] [address]    Special Requests   Final    Blood Culture adequate volume Performed at [facility], [address] [place], [place] [address]    Culture  Setup Time   Final    GRAM POSITIVE COCCI IN BOTH AEROBIC AND ANAEROBIC BOTTLES Gram Stain Report Called to,Read  Back By and Verified With: T LENARD AT 1350 ON [id number] BY S DALTON CRITICAL RESULT CALLED TO, READ BACK BY AND VERIFIED WITH: J ATAMBILE RN 07/10/2023 @ 2259 BY AB    Culture (A)  Final    METHICILLIN RESISTANT STAPHYLOCOCCUS AUREUS Sent to [facility] for further susceptibility testing. Performed at [facility],  [address] [place], [place] [address]    Report Status PENDING  Incomplete   Organism ID, Bacteria METHICILLIN RESISTANT STAPHYLOCOCCUS AUREUS  Final      Susceptibility   Methicillin resistant staphylococcus aureus - MIC*    CIPROFLOXACIN  <=0.5 SENSITIVE Sensitive     ERYTHROMYCIN >=8 RESISTANT Resistant     GENTAMICIN <=0.5 SENSITIVE Sensitive     OXACILLIN >=4 RESISTANT Resistant     TETRACYCLINE <=1 SENSITIVE Sensitive     VANCOMYCIN  1 SENSITIVE Sensitive     TRIMETH /SULFA  <=10 SENSITIVE Sensitive     CLINDAMYCIN <=0.25 SENSITIVE Sensitive     RIFAMPIN <=0.5 SENSITIVE Sensitive     Inducible Clindamycin NEGATIVE Sensitive     LINEZOLID 2 SENSITIVE Sensitive     * METHICILLIN RESISTANT STAPHYLOCOCCUS AUREUS  Blood Culture ID Panel (Reflexed)     Status: Abnormal   Collection Time: 07/09/23  1:14 PM  Result Value Ref Range Status   Enterococcus faecalis NOT DETECTED NOT DETECTED Final   Enterococcus Faecium NOT DETECTED NOT DETECTED Final   Listeria monocytogenes NOT DETECTED NOT DETECTED Final   Staphylococcus species DETECTED (A) NOT DETECTED Final    Comment: CRITICAL RESULT CALLED TO, READ BACK BY AND VERIFIED WITH: J ATAMBILE RN 07/10/2023 @ 2259 BY AB    Staphylococcus aureus (BCID) DETECTED (A) NOT DETECTED Final    Comment: Methicillin (oxacillin)-resistant Staphylococcus aureus (MRSA). MRSA is predictably resistant to beta-lactam antibiotics (except ceftaroline). Preferred therapy is vancomycin  unless clinically contraindicated. Patient requires contact precautions if  hospitalized. CRITICAL RESULT CALLED TO, READ BACK BY AND VERIFIED WITH: J ATAMBILE RN 07/10/2023 @ 2259 BY AB    Staphylococcus epidermidis NOT DETECTED NOT DETECTED Final   Staphylococcus lugdunensis NOT DETECTED NOT DETECTED Final   Streptococcus species NOT DETECTED NOT DETECTED Final   Streptococcus agalactiae NOT DETECTED NOT DETECTED Final   Streptococcus pneumoniae NOT DETECTED NOT DETECTED Final    Streptococcus pyogenes NOT DETECTED NOT DETECTED Final   A.calcoaceticus-baumannii NOT DETECTED NOT DETECTED Final   Bacteroides fragilis NOT DETECTED NOT DETECTED Final   Enterobacterales NOT DETECTED NOT DETECTED Final   Enterobacter cloacae complex NOT DETECTED NOT DETECTED Final   Escherichia coli NOT DETECTED NOT DETECTED Final   Klebsiella aerogenes NOT DETECTED NOT DETECTED Final   Klebsiella oxytoca NOT DETECTED NOT DETECTED Final   Klebsiella pneumoniae NOT DETECTED NOT DETECTED Final   Proteus species NOT DETECTED NOT DETECTED Final   Salmonella species NOT DETECTED NOT DETECTED Final   Serratia marcescens NOT DETECTED NOT DETECTED Final   Haemophilus influenzae NOT DETECTED NOT DETECTED Final   Neisseria meningitidis NOT DETECTED NOT DETECTED Final   Pseudomonas aeruginosa NOT DETECTED NOT DETECTED Final   Stenotrophomonas maltophilia NOT DETECTED NOT DETECTED Final   Candida albicans NOT DETECTED NOT DETECTED Final   Candida auris NOT DETECTED NOT DETECTED Final   Candida glabrata NOT DETECTED NOT DETECTED Final   Candida krusei NOT DETECTED NOT DETECTED Final   Candida parapsilosis NOT DETECTED NOT DETECTED Final   Candida tropicalis NOT DETECTED NOT DETECTED Final   Cryptococcus neoformans/gattii NOT DETECTED NOT DETECTED Final   Meth resistant mecA/C and  MREJ DETECTED (A) NOT DETECTED Final    Comment: CRITICAL RESULT CALLED TO, READ BACK BY AND VERIFIED WITH: J ATAMBILE RN 07/10/2023 @ 2259 BY AB Performed at [facility], [address], [place], [place] [address]   Culture, blood (Routine X 2) w Reflex to ID Panel     Status: None (Preliminary result)   Collection Time: 07/13/23 10:39 AM   Specimen: BLOOD  Result Value Ref Range Status   Specimen Description BLOOD BLOOD RIGHT HAND  Final   Special Requests   Final    BOTTLES DRAWN AEROBIC AND ANAEROBIC Blood Culture adequate volume   Culture   Final    NO GROWTH 1 DAY Performed at [facility],  [address], [place], [place] [address]    Report Status PENDING  Incomplete  Culture, blood (Routine X 2) w Reflex to ID Panel     Status: None (Preliminary result)   Collection Time: 07/13/23 10:41 AM   Specimen: BLOOD  Result Value Ref Range Status   Specimen Description BLOOD BLOOD RIGHT HAND  Final   Special Requests   Final    BOTTLES DRAWN AEROBIC AND ANAEROBIC Blood Culture adequate volume   Culture   Final    NO GROWTH 1 DAY Performed at [facility], [address] [place], [place] [address]    Report Status PENDING  Incomplete  MRSA Next Gen by PCR, Nasal     Status: Abnormal   Collection Time: 07/13/23  1:28 PM   Specimen: Nasal Mucosa; Nasal Swab  Result Value Ref Range Status   MRSA by PCR Next Gen DETECTED (A) NOT DETECTED Final    Comment: RESULT CALLED TO, READ BACK BY AND VERIFIED WITH: L CLARKSON AT 1627 07/13/23 BY A WILSON (NOTE) The GeneXpert MRSA Assay (FDA approved for NASAL specimens only), is one component of a comprehensive MRSA colonization surveillance program. It is not intended to diagnose MRSA infection nor to guide or monitor treatment for MRSA infections. Test performance is not FDA approved in patients less than 59 years old. Performed at [facility], [address] [place], [place] [address]     Scheduled Meds:  sodium chloride    Intravenous Once   ascorbic acid  500 mg Oral Daily   benztropine   2 mg Oral BID   Chlorhexidine  Gluconate Cloth  6 each Topical Daily   vitamin B-12  1,000 mcg Oral Daily   enoxaparin  (LOVENOX ) injection  40 mg Subcutaneous Q24H   feeding supplement  237 mL Oral TID BM   insulin  aspart  0-15 Units Subcutaneous TID WC   insulin  aspart  0-5 Units Subcutaneous QHS   insulin  glargine-yfgn  10 Units Subcutaneous QHS   levothyroxine   75 mcg Oral Q0600   multivitamin with minerals  1 tablet Oral Daily   mupirocin ointment  1 Application Nasal BID   oxybutynin  5 mg Oral TID   paliperidone   3 mg Oral BID   pantoprazole    40 mg Oral Daily   phosphorus  250 mg Oral TID   polyethylene glycol  17 g Oral BID   QUEtiapine   600 mg Oral QHS   [START ON 07/15/2023] rosuvastatin   5 mg Oral Weekly   senna-docusate  2 tablet Oral QHS   zinc sulfate (50mg  elemental zinc)  220 mg Oral Daily   Continuous Infusions:  [START ON 07/15/2023] DAPTOmycin       LOS: 5 days    Justina Oman M.D on 07/14/2023  Go to  [email or web address] - for contact info  [facility] - Office  [phone]  If 7PM-7AM, please contact night-coverage [email or web address] 07/14/2023, 6:01 PM

## 2023-07-14 NOTE — Inpatient Diabetes Management (Signed)
 Inpatient Diabetes Program Recommendations  AACE/ADA: New Consensus Statement on Inpatient Glycemic Control  Target Ranges:  Prepandial:   less than 140 mg/dL      Peak postprandial:   less than 180 mg/dL (1-2 hours)      Critically ill patients:  140 - 180 mg/dL    Latest Reference Range & Units 07/13/23 07:10 07/13/23 11:05 07/13/23 15:55 07/13/23 20:09 07/14/23 07:44  Glucose-Capillary 70 - 99 mg/dL 696 (H) 295 (H) 284 (H) 201 (H) 157 (H)   Review of Glycemic Control  Diabetes history: DM2 Outpatient Diabetes medications: Metformin XR 500 mg daily Current orders for Inpatient glycemic control: Semglee  10 units at bedtime, Novolog  0-15 units TID with meals, Novolog  0-5 units QHS  Inpatient Diabetes Program Recommendations:    Diet: Please consider discontinuing Regular diet and ordering Carb Modified diet.  Insulin : If diet changed and post prandial glucose remains elevated, may need to add meal coverage insulin .  Thanks, Beacher Limerick, RN, MSN, CDCES Diabetes Coordinator Inpatient Diabetes Program [phone] (Team Pager from 8am to 5pm)

## 2023-07-14 NOTE — Progress Notes (Signed)
 Mobility Specialist Progress Note:    07/14/23 0950  Mobility  Activity Stood at bedside;Transferred from bed to chair  Level of Assistance Maximum assist, patient does 25-49%  Assistive Device Front wheel walker  Distance Ambulated (ft) 3 ft  Range of Motion/Exercises Active;All extremities  Activity Response Tolerated well  Mobility Referral Yes  Mobility visit 1 Mobility  Mobility Specialist Start Time (ACUTE ONLY) [id number]  Mobility Specialist Stop Time (ACUTE ONLY) 0950  Mobility Specialist Time Calculation (min) (ACUTE ONLY) 22 min   Pt received in bed, agreeable to mobility. Required MaxA to stand and transfer with RW. Tolerated well, asx throughout. Left pt in chair, alarm on. Call bell in hand, all needs met.  Glinda Lapping Mobility Specialist Please contact via Special educational needs teacher or  Rehab office at [phone]

## 2023-07-14 NOTE — Progress Notes (Signed)
 Mobility Specialist Progress Note:    07/14/23 1210  Mobility  Activity Stood at bedside;Transferred from chair to bed  Level of Assistance Maximum assist, patient does 25-49%  Assistive Device Front wheel walker  Distance Ambulated (ft) 3 ft  Range of Motion/Exercises Active;All extremities  Activity Response Tolerated well  Mobility Referral Yes  Mobility visit 1 Mobility  Mobility Specialist Start Time (ACUTE ONLY) 1155  Mobility Specialist Stop Time (ACUTE ONLY) 1210  Mobility Specialist Time Calculation (min) (ACUTE ONLY) 15 min   Pt received in chair, nursing staff requesting assistance to transfer. Required MaxA to stand and pivot with RW. Tolerated well, asx throughout. Left pt supine, all needs met.  Glinda Lapping Mobility Specialist Please contact via Special educational needs teacher or  Rehab office at [phone]

## 2023-07-14 NOTE — TOC Progression Note (Signed)
 Transition of Care [facility]) - Progression Note    Patient Details  Name: [name] MRN: [id number] Date of Birth: [birth year]  Transition of Care [facility]) CM/SW Contact  Cyndie Dredge, [place] Phone Number: 07/14/2023, 12:13 PM  Clinical Narrative:     Writer spoke with Pam with [facility] this morning about patient needing IV antibiotics when he DC. Pam reached out to [facility] to see if they can add nursing .Pam is following patient chart for recommendation of IV antibiotics. Writer shared with Pam that she would need to speak with nephew or patient sister regarding the teaching since they provide most of his care needs. TOC to follow.   Expected Discharge Plan: Home w Home Health Services Barriers to Discharge: Continued Medical Work up  Expected Discharge Plan and Services In-house Referral: Clinical Social Work   Post Acute Care Choice: Durable Medical Equipment Living arrangements for the past 2 months: Single Family Home                 DME Arranged: N/A         HH Arranged: PT, Refused SNF HH Agency: [facility] Date [facility] Agency Contacted: 07/13/23 Time HH Agency Contacted: 1320 Representative spoke with at [facility] Agency: Randel Buss   Social Determinants of Health (SDOH) Interventions SDOH Screenings   Food Insecurity: No Food Insecurity (07/09/2023)  Housing: Low Risk  (07/09/2023)  Transportation Needs: No Transportation Needs (07/09/2023)  Utilities: Not At Risk (07/09/2023)  Financial Resource Strain: Medium Risk (05/19/2022)   Received from [facility], [facility], [facility]  Social Connections: Unknown (07/10/2023)  Tobacco Use: Medium Risk (07/09/2023)  Health Literacy: High Risk (02/24/2021)   Received from [facility], [facility]    Readmission Risk Interventions    07/14/2023   12:12 PM 07/13/2023    1:18 PM  Readmission Risk Prevention Plan  Transportation Screening Complete Complete  HRI or Home Care Consult Complete  Complete  Social Work Consult for Recovery Care Planning/Counseling Complete Complete  Palliative Care Screening Not Applicable Not Applicable  Medication Review Ocean[employer]) Complete Complete

## 2023-07-14 NOTE — Progress Notes (Signed)
 Pharmacy Antibiotic Note  [name] is a 68 y.o. male admitted on 07/09/2023 with MRSA bacteremia.  Pharmacy has been consulted to transition Vancomycin  to Daptomycin.  A Vancomycin  peak/trough resulted as 41/18 mcg/ml respectively for a calculated AUC of 661 which is SUPRAtherapeutic.   50-75% of the AM Vancomycin  dose was given after the trough was drawn - this will hold him over until transition to Daptomycin on 5/23 AM in anticipation of OPAT.   Plan: - D/c Vancomycin  - Start Daptomycin 700 mg IV every 24 hours on 5/23 AM - F/u CK with AM labs, renal function for dose adjustments, blood culture clearance for OPAT  Height: 5\' 10"  (177.8 cm) Weight: 87 kg (191 lb 12.8 oz) IBW/kg (Calculated) : 73  Temp (24hrs), Avg:98.1 F (36.7 C), Min:97.5 F (36.4 C), Max:99.1 F (37.3 C)  Recent Labs  Lab 07/09/23 1314 07/09/23 1402 07/09/23 1729 07/09/23 1957 07/10/23 0342 07/11/23 0404 07/12/23 0409 07/13/23 0500 07/13/23 1039 07/13/23 1349  WBC 10.9*  --   --   --  8.9 9.4 9.6 8.7  --   --   CREATININE 2.09*  --   --   --  1.57* 1.19  --  1.05  --   --   LATICACIDVEN 2.3* 2.4* 2.3* 2.2*  --   --   --   --   --   --   VANCOPEAK  --   --   --   --   --   --   --   --  14* 41*    Estimated Creatinine Clearance: 70.5 mL/min (by C-G formula based on SCr of 1.05 mg/dL).    No Known Allergies  Antimicrobials this admission: CRO 5/17 > 5/18 Vancomycin  5/19 >> 5/22  Microbiology results: 5/17 UCx >> mult species, needs recollect 5/17 BCx >> 4/4 MRSA 5/21 BCx >> [id number]  Thank you for allowing pharmacy to be a part of this patient's care.  Garland Junk, PharmD, BCPS, BCIDP Infectious Diseases Clinical Pharmacist 07/14/2023 3:50 PM   **Pharmacist phone directory can now be found on [email or web address] ([email or web address]).  Listed under [facility].

## 2023-07-14 NOTE — Plan of Care (Signed)
   Problem: Education: Goal: Knowledge of General Education information will improve Description: Including pain rating scale, medication(s)/side effects and non-pharmacologic comfort measures Outcome: Progressing   Problem: Health Behavior/Discharge Planning: Goal: Ability to manage health-related needs will improve Outcome: Progressing   Problem: Clinical Measurements: Goal: Ability to maintain clinical measurements within normal limits will improve Outcome: Progressing Goal: Will remain free from infection Outcome: Progressing Goal: Diagnostic test results will improve Outcome: Progressing Goal: Respiratory complications will improve Outcome: Progressing Goal: Cardiovascular complication will be avoided Outcome: Progressing   Problem: Activity: Goal: Risk for activity intolerance will decrease Outcome: Progressing   Problem: Nutrition: Goal: Adequate nutrition will be maintained Outcome: Progressing   Problem: Coping: Goal: Level of anxiety will decrease Outcome: Progressing   Problem: Elimination: Goal: Will not experience complications related to bowel motility Outcome: Progressing Goal: Will not experience complications related to urinary retention Outcome: Progressing   Problem: Pain Managment: Goal: General experience of comfort will improve and/or be controlled Outcome: Progressing   Problem: Safety: Goal: Ability to remain free from injury will improve Outcome: Progressing   Problem: Skin Integrity: Goal: Risk for impaired skin integrity will decrease Outcome: Progressing   Problem: Education: Goal: Ability to describe self-care measures that may prevent or decrease complications (Diabetes Survival Skills Education) will improve Outcome: Progressing Goal: Individualized Educational Video(s) Outcome: Progressing   Problem: Coping: Goal: Ability to adjust to condition or change in health will improve Outcome: Progressing   Problem: Fluid  Volume: Goal: Ability to maintain a balanced intake and output will improve Outcome: Progressing   Problem: Health Behavior/Discharge Planning: Goal: Ability to identify and utilize available resources and services will improve Outcome: Progressing Goal: Ability to manage health-related needs will improve Outcome: Progressing   Problem: Metabolic: Goal: Ability to maintain appropriate glucose levels will improve Outcome: Progressing   Problem: Nutritional: Goal: Maintenance of adequate nutrition will improve Outcome: Progressing Goal: Progress toward achieving an optimal weight will improve Outcome: Progressing   Problem: Skin Integrity: Goal: Risk for impaired skin integrity will decrease Outcome: Progressing   Problem: Tissue Perfusion: Goal: Adequacy of tissue perfusion will improve Outcome: Progressing   Problem: Education: Goal: Ability to describe self-care measures that may prevent or decrease complications (Diabetes Survival Skills Education) will improve Outcome: Progressing Goal: Individualized Educational Video(s) Outcome: Progressing   Problem: Coping: Goal: Ability to adjust to condition or change in health will improve Outcome: Progressing   Problem: Fluid Volume: Goal: Ability to maintain a balanced intake and output will improve Outcome: Progressing   Problem: Health Behavior/Discharge Planning: Goal: Ability to identify and utilize available resources and services will improve Outcome: Progressing Goal: Ability to manage health-related needs will improve Outcome: Progressing   Problem: Metabolic: Goal: Ability to maintain appropriate glucose levels will improve Outcome: Progressing   Problem: Nutritional: Goal: Maintenance of adequate nutrition will improve Outcome: Progressing Goal: Progress toward achieving an optimal weight will improve Outcome: Progressing   Problem: Skin Integrity: Goal: Risk for impaired skin integrity will  decrease Outcome: Progressing   Problem: Tissue Perfusion: Goal: Adequacy of tissue perfusion will improve Outcome: Progressing

## 2023-07-14 NOTE — Progress Notes (Signed)
 PHARMACY CONSULT NOTE FOR:  OUTPATIENT  PARENTERAL ANTIBIOTIC THERAPY (OPAT)  Indication: MRSA bacteremia Regimen: Daptomycin 700 mg IV every 24 hours End date: 08/10/23 (4 weeks from neg BCx 5/21)  IV antibiotic discharge orders are pended. To discharging provider:  please sign these orders via discharge navigator,  Select New Orders & click on the button choice - Manage This Unsigned Work.     Thank you for allowing pharmacy to be a part of this patient's care.  Garland Junk, PharmD, BCPS, BCIDP Infectious Diseases Clinical Pharmacist 07/15/2023 10:50 AM   **Pharmacist phone directory can now be found on [email or web address] ([email or web address]).  Listed under [facility].

## 2023-07-14 NOTE — Plan of Care (Signed)
  Problem: Education: Goal: Knowledge of General Education information will improve Description: Including pain rating scale, medication(s)/side effects and non-pharmacologic comfort measures Outcome: Progressing   Problem: Clinical Measurements: Goal: Ability to maintain clinical measurements within normal limits will improve Outcome: Progressing Goal: Will remain free from infection Outcome: Progressing   Problem: Coping: Goal: Ability to adjust to condition or change in health will improve Outcome: Progressing

## 2023-07-15 DIAGNOSIS — N179 Acute kidney failure, unspecified: Secondary | ICD-10-CM | POA: Diagnosis not present

## 2023-07-15 DIAGNOSIS — R531 Weakness: Principal | ICD-10-CM

## 2023-07-15 DIAGNOSIS — E872 Acidosis, unspecified: Secondary | ICD-10-CM

## 2023-07-15 DIAGNOSIS — N312 Flaccid neuropathic bladder, not elsewhere classified: Secondary | ICD-10-CM | POA: Diagnosis not present

## 2023-07-15 DIAGNOSIS — R7881 Bacteremia: Secondary | ICD-10-CM

## 2023-07-15 DIAGNOSIS — E139 Other specified diabetes mellitus without complications: Secondary | ICD-10-CM

## 2023-07-15 LAB — GLUCOSE, CAPILLARY
Glucose-Capillary: 128 mg/dL — ABNORMAL HIGH (ref 70–99)
Glucose-Capillary: 242 mg/dL — ABNORMAL HIGH (ref 70–99)

## 2023-07-15 LAB — CK: Total CK: 18 U/L — ABNORMAL LOW (ref 49–397)

## 2023-07-15 MED ORDER — DAPTOMYCIN IV (FOR PTA / DISCHARGE USE ONLY): 700.0000 mg | INTRAVENOUS | 0 refills | Status: AC

## 2023-07-15 MED ORDER — DAPTOMYCIN-SODIUM CHLORIDE 700-0.9 MG/100ML-% IV SOLN: 700.0000 mg | Freq: Every day | INTRAVENOUS | 0 refills | Status: DC

## 2023-07-15 MED ORDER — CYANOCOBALAMIN 1000 MCG PO TABS: 1000.0000 ug | ORAL_TABLET | Freq: Every day | ORAL | 2 refills | Status: DC

## 2023-07-15 MED ORDER — ROSUVASTATIN CALCIUM 5 MG PO TABS: 5.0000 mg | ORAL_TABLET | ORAL | Status: AC

## 2023-07-15 MED ORDER — SODIUM CHLORIDE 0.9% FLUSH: 10.0000 mL | INTRAVENOUS | Status: DC | PRN

## 2023-07-15 MED ORDER — MUPIROCIN 2 % EX OINT: 1.0000 | TOPICAL_OINTMENT | Freq: Two times a day (BID) | CUTANEOUS | 0 refills | Status: AC

## 2023-07-15 MED ORDER — CHLORHEXIDINE GLUCONATE CLOTH 2 % EX PADS: 6.0000 | MEDICATED_PAD | Freq: Every day | CUTANEOUS | 0 refills | Status: AC

## 2023-07-15 MED ORDER — ASCORBIC ACID 500 MG PO TABS: 500.0000 mg | ORAL_TABLET | Freq: Every day | ORAL | 2 refills | Status: DC

## 2023-07-15 MED ORDER — OXYBUTYNIN CHLORIDE 5 MG PO TABS: 5.0000 mg | ORAL_TABLET | Freq: Three times a day (TID) | ORAL | 0 refills | Status: AC

## 2023-07-15 NOTE — Progress Notes (Signed)
 Peripherally Inserted Central Catheter Placement  The IV Nurse has discussed with the patient and/or persons authorized to consent for the patient, the purpose of this procedure and the potential benefits and risks involved with this procedure.  The benefits include less needle sticks, lab draws from the catheter, and the patient may be discharged home with the catheter. Risks include, but not limited to, infection, bleeding, blood clot (thrombus formation), and puncture of an artery; nerve damage and irregular heartbeat and possibility to perform a PICC exchange if needed/ordered by physician.  Alternatives to this procedure were also discussed.  Bard Power PICC patient education guide, fact sheet on infection prevention and patient information card has been provided to patient /or left at bedside.    PICC Placement Documentation  PICC Single Lumen 07/15/23 Right Brachial 40 cm 0 cm (Active)  Indication for Insertion or Continuance of Line Prolonged intravenous therapies 07/15/23 1416  Exposed Catheter (cm) 0 cm 07/15/23 1416  Site Assessment Clean, Dry, Intact 07/15/23 1416  Line Status Flushed;Saline locked;Blood return noted 07/15/23 1416  Dressing Type Transparent;Securing device 07/15/23 1416  Dressing Status Antimicrobial disc/dressing in place;Clean, Dry, Intact 07/15/23 1416  Line Care Connections checked and tightened 07/15/23 1416  Line Adjustment (NICU/IV Team Only) No 07/15/23 1416  Dressing Intervention New dressing;Adhesive placed at insertion site (IV team only) 07/15/23 1416  Dressing Change Due 07/22/23 07/15/23 1416       Nadean August 07/15/2023, 2:17 PM

## 2023-07-15 NOTE — Plan of Care (Signed)
   Problem: Education: Goal: Knowledge of General Education information will improve Description Including pain rating scale, medication(s)/side effects and non-pharmacologic comfort measures Outcome: Progressing   Problem: Health Behavior/Discharge Planning: Goal: Ability to manage health-related needs will improve Outcome: Progressing

## 2023-07-15 NOTE — Discharge Summary (Signed)
 Physician Discharge Summary   Patient: [name] MRN: [id number] DOB: 09/20/1955  Admit date:     07/09/2023  Discharge date: 07/15/23  Discharge Physician: Justina Oman   PCP: [name], MD   Recommendations at discharge:  Make sure patient follow-up with urology service as instructed Repeat basic metabolic panel to follow electrolytes and renal function Follow CBC to assure stability in patient's WBCs and hemoglobin trend. Patient's statin has been placed on hold for the next 4 weeks to minimize rhabdomyolysis development while receiving treatment with daptomycin.  Discharge Diagnoses: Principal Problem:   AKI (acute kidney injury) (HCC) Active Problems:   Hyponatremia   Constipation   Hypotonic bladder   Dementia (HCC)   DM (diabetes mellitus) (HCC)   Failure to thrive in adult   Metabolic acidosis   Protein deficiency (HCC)   Normocytic anemia   Lactic acidosis   Malnutrition of moderate degree   Weakness   Bacteremia  Brief Hospital admission narrative: As per H&P written by Dr. Cathyann Cobia on 07/09/2023  [name] is a 68 y.o. male with medical history significant of dementia, schizophrenia, hypertension, hypothyroidism, diabetes.  Patient is unable able to provide much history due to cognitive decline.  History is obtained by the patient's family who note that he has been appearing a little bit more confused over the last 2 to 3 days.  He has also had decreased appetite.  His decreased appetite has been going on a little bit longer -closer to 2 to 3 weeks.  His sister does note that he has been taking a protein supplement 3 times a day but that his appetite is decreased regardless.  No fevers, chills, nausea, vomiting.  He has been constipated.   He was admitted in the beginning of the month and discharged about 2 weeks ago.  At the time of his discharge, it was recommended that he go to a skilled nursing facility, but his family did not want this.     Assessment and Plan: 1)MRSA Bacteremia---??  Due to chronic indwelling suprapubic catheter Blood cultures from 07/09/2023 with MRSA - Narrow antibiotics to IV vancomycin  (started on 07/11/2023) - TTE on 07/12/2023 without vegetations - Appreciate assistance by cardiology service; patient has declined TEE - After discussing with ID physician (Dr. Artemio Larry) decision-made to pursued 4 weeks of IV antibiotics using daptomycin every 24 hours. - PICC line placed prior to discharge. - Plan is for 28 more days of IV antibiotics at discharge.   2)AKI----acute kidney injury  -  creatinine on admission= 2.09  ,  baseline creatinine = 1.1 on 06/25/2023    ,  creatinine is now= trending down appropriately with hydration, -- Continue to renally adjust medications, avoid nephrotoxic agents / dehydration  / hypotension - Continue to follow renal function trend intermittently with repeat basic metabolic panel follow-up visit.[name]   3)Acute on chronic anemia--baseline hemoglobin usually around 8,  - Hgb dow after hydration due to hemodilution -Hgb up to 7.5 from 6.6 after transfusion 1 unit of PRBC on 07/11/2023 - Stable hemoglobin at the moment; continue to follow trend. - No overt bleeding appreciated. -Recommending repeat CBC to follow hemoglobin trend/stability at follow-up visit.   4)DM2-A1C 7.0 - Resume home hypoglycemic regimen - Continue to follow CBG fluctuation and adjust medication management as required.   5)Hyponatremia--up to 131 from 124 after IV fluids - Continue to follow electrolytes trend with repeat basic metabolic panel follow-up visit.[name]   6)Paranoid schizophrenia/dementia --- stable, continue Cogentin  along  with Seroquel  and Invega . - Patient was seen by his primary psychiatrist within a week prior to this admission. - Overall condition stable and patient appropriately following commands.[name]   7)Generalized weakness and deconditioning----ambulatory dysfunction  - Please get PT eval  prior to discharge - Family open home with taking patient back home with home health services. - Home health PT and home health RN has been arranged at discharge.   8) chronic indwelling suprapubic catheter--- - Suprapubic catheter last changed 06/30/2023--per Dr. Claretta Croft (Urology) no indication to change catheter - Catheter is leaking--Dr. Claretta Croft says no need to change he recommends oxybutynin for presumed bladder spasms instead - Outpatient follow-up with urology service will be arranged; discharged with Foley catheter in place.   9) hypothyroidism-- - continue levothyroxine    10) GERD- -continue Protonix  -Lifestyle changes discussed with patient.   11) HLD- -continue Crestor  as previously prescribed after August 13, 2023 to minimize rhabdomyolysis development while receiving treatment with daptomycin.  12) stage I decubitus pressure injury - Present at time of admission - No signs of superimposed infection - Continue constant repositioning and preventive measures.  Consultants: Infectious disease Procedures performed: See below for x-ray reports. Disposition: Home with home health services. Diet recommendation: Heart healthy/modified carbohydrate diet.  DISCHARGE MEDICATION: Allergies as of 07/15/2023   No Known Allergies      Medication List     STOP taking these medications    cefUROXime 500 MG tablet Commonly known as: CEFTIN   Invega  Sustenna 234 MG/1.5ML injection Generic drug: paliperidone    meloxicam 15 MG tablet Commonly known as: MOBIC       TAKE these medications    ascorbic acid 500 MG tablet Commonly known as: VITAMIN C Take 1 tablet (500 mg total) by mouth daily.   benztropine  2 MG tablet Commonly known as: COGENTIN  Take 1 tablet (2 mg total) by mouth at bedtime. What changed: when to take this   Chlorhexidine  Gluconate Cloth 2 % Pads Apply 6 each topically daily for 3 days.   cyanocobalamin  1000 MCG tablet Take 1 tablet (1,000 mcg  total) by mouth daily.   daptomycin 700-0.9 MG/100ML-% Soln Commonly known as: CUBICIN Inject 100 mLs (700 mg total) into the vein daily at 2 PM for 28 days. Start taking on: Jul 16, 2023   donepezil 5 MG tablet Commonly known as: ARICEPT Take 5 mg by mouth daily.   HYDROcodone-acetaminophen  5-325 MG tablet Commonly known as: NORCO/VICODIN Take 1 tablet by mouth every 6 (six) hours as needed for moderate pain (pain score 4-6).   levothyroxine  75 MCG tablet Commonly known as: SYNTHROID  Take 1 tablet (75 mcg total) by mouth daily.   metFORMIN 500 MG 24 hr tablet Commonly known as: GLUCOPHAGE-XR Take 500 mg by mouth daily.   mupirocin ointment 2 % Commonly known as: BACTROBAN Place 1 Application into the nose 2 (two) times daily for 3 days.   omeprazole  20 MG capsule Commonly known as: PRILOSEC Take 1 capsule (20 mg total) by mouth daily.   oxybutynin 5 MG tablet Commonly known as: DITROPAN Take 1 tablet (5 mg total) by mouth 3 (three) times daily.   paliperidone  3 MG 24 hr tablet Commonly known as: INVEGA  Take 1 tablet by mouth 2 (two) times daily.   polyethylene glycol 17 g packet Commonly known as: MIRALAX  / GLYCOLAX  Take 17 g by mouth 2 (two) times daily. What changed:  when to take this reasons to take this   QUEtiapine  200 MG tablet Commonly known as:  SEROQUEL  Take 600 mg by mouth at bedtime.   rosuvastatin  5 MG tablet Commonly known as: CRESTOR  Take 1 tablet (5 mg total) by mouth once a week. Take 5 mg by mouth every week on Friday (please hold medication until August 16, 2023 to minimize risk of rhabdomyolysis while using daptomycin). Start taking on: August 13, 2023 What changed:  additional instructions These instructions start on August 13, 2023. If you are unsure what to do until then, ask your doctor or other care provider.               Discharge Care Instructions  (From admission, onward)           Start     Ordered   07/15/23 0000   Discharge wound care:       Comments: Area clean and dry; continue constant repositioning and the use of preventative barriers.   07/15/23 1438            Follow-up Information     [facility] Follow up.   Why: HHPT / HHRN  will call to schedule first home visit.        [facility] Follow up.   Why: This specialty infusion service agency will follow up with you in the community regarding your IV antibiotics needs.        [name], MD. Schedule an appointment as soon as possible for a visit in 10 day(s).   Specialty: Internal Medicine Contact information: [address] [place] [address] [phone]                Discharge Exam: Filed Weights   07/09/23 1107  Weight: 87 kg   General exam: In no acute distress; no overnight events.  Afebrile. Respiratory system: Clear to auscultation. Respiratory effort normal.  Good saturation on room air. Cardiovascular system: No rubs or gallops; no JVD. Gastrointestinal system: Abdomen is nondistended, soft and nontender.  Positive bowel sounds appreciated.  Suprapubic catheter in place. Central nervous system: Moving 4 limbs spontaneously.  No focal neurological deficits. Extremities: No cyanosis or clubbing. Skin: No petechiae. Psychiatry: Flat affect appreciated on exam.  Condition at discharge: Stable and improved.  The results of significant diagnostics from this hospitalization (including imaging, microbiology, ancillary and laboratory) are listed below for reference.   Imaging Studies: US  EKG SITE RITE Result Date: 07/13/2023 If Site Rite image not attached, placement could not be confirmed due to current cardiac rhythm.  ECHOCARDIOGRAM COMPLETE Result Date: 07/12/2023    ECHOCARDIOGRAM REPORT   Patient Name:   [name] Date of Exam: 07/12/2023 Medical Rec #:  [id number]       Height:       70.0 in Accession #:    [id number]      Weight:       191.8 lb Date of Birth:  [birth year]       BSA:           2.051 m Patient Age:    68 years        BP:           104/53 mmHg Patient Gender: M               HR:           91 bpm. Exam Location:  [facility] Procedure: 2D Echo, Cardiac Doppler, Color Doppler and Intracardiac            Opacification Agent (Both Spectral and Color Flow  Doppler were            utilized during procedure). Indications:    Bacteremia R78.81  History:        Patient has no prior history of Echocardiogram examinations.                 Risk Factors:Diabetes. Hx of Schizophrenia Dementia.  Sonographer:    Denese Finn RCS Referring Phys: [phone] COURAGE EMOKPAE IMPRESSIONS  1. Left ventricular ejection fraction, by estimation, is >75%. The left ventricle has hyperdynamic function. The left ventricle has no regional wall motion abnormalities. Left ventricular diastolic parameters were normal.  2. Right ventricular systolic function is normal. The right ventricular size is normal.  3. The mitral valve is normal in structure. Trivial mitral valve regurgitation.  4. The aortic valve is tricuspid. Aortic valve regurgitation is not visualized. Aortic valve sclerosis is present, with no evidence of aortic valve stenosis.  5. The inferior vena cava is normal in size with greater than 50% respiratory variability, suggesting right atrial pressure of 3 mmHg. FINDINGS  Left Ventricle: Left ventricular ejection fraction, by estimation, is >75%. The left ventricle has hyperdynamic function. The left ventricle has no regional wall motion abnormalities. Definity contrast agent was given IV to delineate the left ventricular endocardial borders. The left ventricular internal cavity size was normal in size. There is no left ventricular hypertrophy. Left ventricular diastolic parameters were normal. Right Ventricle: The right ventricular size is normal. Right vetricular wall thickness was not assessed. Right ventricular systolic function is normal. Left Atrium: Left atrial size was normal in size. Right Atrium: Right  atrial size was normal in size. Pericardium: There is no evidence of pericardial effusion. Mitral Valve: The mitral valve is normal in structure. Trivial mitral valve regurgitation. Tricuspid Valve: The tricuspid valve is normal in structure. Tricuspid valve regurgitation is trivial. Aortic Valve: The aortic valve is tricuspid. Aortic valve regurgitation is not visualized. Aortic valve sclerosis is present, with no evidence of aortic valve stenosis. Aortic valve mean gradient measures 6.0 mmHg. Aortic valve peak gradient measures 11.3 mmHg. Aortic valve area, by VTI measures 2.78 cm. Pulmonic Valve: The pulmonic valve was normal in structure. Pulmonic valve regurgitation is not visualized. Aorta: The aortic root is normal in size and structure. Venous: The inferior vena cava is normal in size with greater than 50% respiratory variability, suggesting right atrial pressure of 3 mmHg. IAS/Shunts: No atrial level shunt detected by color flow Doppler.  LEFT VENTRICLE PLAX 2D LVIDd:         4.50 cm   Diastology LVIDs:         2.70 cm   LV e' medial:    6.74 cm/s LV PW:         1.10 cm   LV E/e' medial:  13.5 LV IVS:        1.00 cm   LV e' lateral:   13.30 cm/s LVOT diam:     2.10 cm   LV E/e' lateral: 6.8 LV SV:         92 LV SV Index:   45 LVOT Area:     3.46 cm  RIGHT VENTRICLE RV S prime:     21.30 cm/s TAPSE (M-mode): 2.2 cm LEFT ATRIUM           Index        RIGHT ATRIUM           Index LA diam:      3.30 cm 1.61 cm/m  RA Area:     15.50 cm LA Vol (A2C): 49.4 ml 24.09 ml/m  RA Volume:   42.20 ml  20.58 ml/m LA Vol (A4C): 55.4 ml 27.02 ml/m  AORTIC VALVE AV Area (Vmax):    3.05 cm AV Area (Vmean):   3.19 cm AV Area (VTI):     2.78 cm AV Vmax:           168.00 cm/s AV Vmean:          113.000 cm/s AV VTI:            0.333 m AV Peak Grad:      11.3 mmHg AV Mean Grad:      6.0 mmHg LVOT Vmax:         148.00 cm/s LVOT Vmean:        104.000 cm/s LVOT VTI:          0.267 m LVOT/AV VTI ratio: 0.80  AORTA Ao Root  diam: 3.50 cm MITRAL VALVE MV Area (PHT): 3.08 cm     SHUNTS MV Decel Time: 246 msec     Systemic VTI:  0.27 m MV E velocity: 90.80 cm/s   Systemic Diam: 2.10 cm MV A velocity: 102.00 cm/s MV E/A ratio:  0.89 Ola Berger MD Electronically signed by Ola Berger MD Signature Date/Time: 07/12/2023/4:14:40 PM    Final    CT CHEST ABDOMEN PELVIS WO CONTRAST Result Date: 07/09/2023 CLINICAL DATA:  Abdominal pain, altered mental status. EXAM: CT CHEST, ABDOMEN AND PELVIS WITHOUT CONTRAST TECHNIQUE: Multidetector CT imaging of the chest, abdomen and pelvis was performed following the standard protocol without IV contrast. RADIATION DOSE REDUCTION: This exam was performed according to the departmental dose-optimization program which includes automated exposure control, adjustment of the mA and/or kV according to patient size and/or use of iterative reconstruction technique. COMPARISON:  Jun 23, 2023. FINDINGS: CT CHEST FINDINGS Cardiovascular: No evidence of thoracic aortic aneurysm. Coronary calcifications are noted. Normal cardiac size. No pericardial effusion. Mediastinum/Nodes: No enlarged mediastinal, hilar, or axillary lymph nodes. Thyroid  gland, trachea, and esophagus demonstrate no significant findings. Lungs/Pleura: Lungs are clear. No pleural effusion or pneumothorax. Musculoskeletal: No chest wall mass or suspicious bone lesions identified. CT ABDOMEN PELVIS FINDINGS Hepatobiliary: No focal liver abnormality is seen. No gallstones, gallbladder wall thickening, or biliary dilatation. Pancreas: Unremarkable. No pancreatic ductal dilatation or surrounding inflammatory changes. Spleen: Moderate splenomegaly. Adrenals/Urinary Tract: Adrenal glands and kidneys appear normal. No hydronephrosis or renal obstruction is noted. Suprapubic bladder catheter is again noted. Stomach/Bowel: The stomach is unremarkable. The appendix appears normal. Large amount of stool seen throughout the colon. There is no evidence of bowel  obstruction. Vascular/Lymphatic: No significant vascular findings are present. No enlarged abdominal or pelvic lymph nodes. Reproductive: Prostate is unremarkable. Other: No ascites is noted. Small fat containing periumbilical hernia is noted. Musculoskeletal: Stable extensive bony destruction of right femoral head with superior subluxation of the right femur relative to acetabulum. Stable rounded prominence of the medial musculature of the right hip which may represent joint effusion. IMPRESSION: Moderate splenomegaly. Large amount of stool seen throughout the colon suggesting constipation. Stable extensive bony destruction of right femoral head is noted with superior subluxation of the right femoral head relative to acetabulum, with stable prominence of the medial musculature and soft tissues of the right hip which may represent joint effusion related to the hip. 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 Coronary artery calcifications are noted suggesting coronary artery disease. Electronically Signed  By: Rosalene Colon M.D.   On: 07/09/2023 14:44   CT Head Wo Contrast Result Date: 07/09/2023 CLINICAL DATA:  Provided history: Altered mental status. EXAM: CT HEAD WITHOUT CONTRAST TECHNIQUE: Contiguous axial images were obtained from the base of the skull through the vertex without intravenous contrast. RADIATION DOSE REDUCTION: This exam was performed according to the departmental dose-optimization program which includes automated exposure control, adjustment of the mA and/or kV according to patient size and/or use of iterative reconstruction technique. COMPARISON:  Head CT 03/17/2021. FINDINGS: Brain: Generalized cerebral atrophy. Patchy and ill-defined hypoattenuation within the cerebral white matter, nonspecific but compatible with mild chronic small vessel ischemic disease. There is no acute intracranial hemorrhage. No demarcated cortical infarct. No extra-axial fluid  collection. No evidence of an intracranial mass. No midline shift. Vascular: No hyperdense vessel.  Atherosclerotic calcifications. Skull: No calvarial fracture or aggressive osseous lesion. Sinuses/Orbits: No mass or acute finding within the imaged orbits. No significant paranasal sinus disease at the imaged levels. IMPRESSION: 1. No acute intracranial finding. 2. Parenchymal atrophy and chronic small vessel ischemic disease. Electronically Signed   By: Bascom Lily D.O.   On: 07/09/2023 14:32   DG Chest Port 1 View Result Date: 07/09/2023 CLINICAL DATA:  68 year old male with possible sepsis. Confusion. Extremity swelling. EXAM: PORTABLE CHEST 1 VIEW COMPARISON:  Portable chest 05/18/2022 and earlier. FINDINGS: Portable AP view at 1231 hours. Lordotic positioning. Lung volumes, mediastinal contours within normal limits. Visualized tracheal air column is within normal limits. No pneumothorax, pulmonary edema, pleural effusion, consolidation. But there is streaky bilateral lower lung, bilateral lower peribronchial opacity suspected. Paucity of bowel gas in the upper abdomen. Chronic severe left shoulder degeneration. No acute osseous abnormality identified. IMPRESSION: Suspicion of bilateral lower lung opacity on this lordotic portable exam. Consider bilateral lower lobe Bronchopneumonia. No definite effusion. PA and lateral views would be helpful when feasible. Electronically Signed   By: Marlise Simpers M.D.   On: 07/09/2023 12:40   CT ABDOMEN PELVIS WO CONTRAST Result Date: 06/23/2023 CLINICAL DATA:  Abdominal pain, acute, nonlocalized, no bowel movement for 10 days EXAM: CT ABDOMEN AND PELVIS WITHOUT CONTRAST TECHNIQUE: Multidetector CT imaging of the abdomen and pelvis was performed following the standard protocol without IV contrast. RADIATION DOSE REDUCTION: This exam was performed according to the departmental dose-optimization program which includes automated exposure control, adjustment of the mA and/or kV  according to patient size and/or use of iterative reconstruction technique. COMPARISON:  March 18, 2021 FINDINGS: Of note, the lack of intravenous contrast limits evaluation of the solid organ parenchyma and vascularity. Lower chest: No focal airspace consolidation or pleural effusion. Subsegmental atelectasis in the right lower lobe. Dense multi-vessel coronary atherosclerosis. Hepatobiliary: No mass. No radiopaque stones or wall thickening of the gallbladder. No intrahepatic or extrahepatic biliary ductal dilation. Pancreas: Diffuse fatty atrophy of the pancreatic parenchyma. No mass or ductal dilation. No peripancreatic inflammation or fluid collection. Spleen: Normal size. No mass. Adrenals/Urinary Tract: No adrenal masses. No renal mass. No hydronephrosis or nephrolithiasis. Decompressed with wall thickening and trabeculation. Well-positioned suprapubic catheter in place. Small bladder calculus noted. Stomach/Bowel: The stomach is decompressed without focal abnormality. No small bowel wall thickening or inflammation. No small bowel obstruction. Normal appendix. Large volume fecal loading throughout the colon. Vascular/Lymphatic: No aortic aneurysm. Scattered aortoiliac atherosclerosis. No intraabdominal or pelvic lymphadenopathy. Reproductive: No prostatomegaly. No free pelvic fluid. Other: No pneumoperitoneum, ascites, or mesenteric inflammation. Musculoskeletal: No acute fracture or destructive lesion. Superior subluxation of the right femur  with respect to the acetabulum. Extensive bony cortical destruction of the entire femoral head with similar shallow, irregular acetabulum. Pronounced soft tissue surrounding the right femoroacetabular joint. Multilevel degenerative disc disease of the spine. Moderate joint space loss of the left hip. The femur is otherwise normal on the left. IMPRESSION: 1. Large volume fecal loading throughout the colon, consistent with constipation. 2. Well-positioned suprapubic  catheter in place. Wall thickening and trabeculation of the urinary bladder, may be due to a neurogenic bladder or chronic inflammation. Correlation with urinalysis recommended to exclude acute cystitis. Electronically Signed   By: Rance Burrows M.D.   On: 06/23/2023 14:08    Microbiology: Results for orders placed or performed during the hospital encounter of 07/09/23  Resp panel by RT-PCR (RSV, Flu A&B, Covid) Urine, Clean Catch     Status: None   Collection Time: 07/09/23 12:52 PM   Specimen: Urine, Clean Catch; Nasal Swab  Result Value Ref Range Status   SARS Coronavirus 2 by RT PCR NEGATIVE NEGATIVE Final    Comment: (NOTE) SARS-CoV-2 target nucleic acids are NOT DETECTED.  The SARS-CoV-2 RNA is generally detectable in upper respiratory specimens during the acute phase of infection. The lowest concentration of SARS-CoV-2 viral copies this assay can detect is 138 copies/mL. A negative result does not preclude SARS-Cov-2 infection and should not be used as the sole basis for treatment or other patient management decisions. A negative result may occur with  improper specimen collection/handling, submission of specimen other than nasopharyngeal swab, presence of viral mutation(s) within the areas targeted by this assay, and inadequate number of viral copies(<138 copies/mL). A negative result must be combined with clinical observations, patient history, and epidemiological information. The expected result is Negative.  Fact Sheet for Patients:  BloggerCourse.com  Fact Sheet for Healthcare Providers:  SeriousBroker.it  This test is no t yet approved or cleared by the [place]  FDA and  has been authorized for detection and/or diagnosis of SARS-CoV-2 by FDA under an Emergency Use Authorization (EUA). This EUA will remain  in effect (meaning this test can be used) for the duration of the COVID-19 declaration under Section  564(b)(1) of the Act, 21 U.S.C.section 360bbb-3(b)(1), unless the authorization is terminated  or revoked sooner.       Influenza A by PCR NEGATIVE NEGATIVE Final   Influenza B by PCR NEGATIVE NEGATIVE Final    Comment: (NOTE) The Xpert Xpress SARS-CoV-2/FLU/RSV plus assay is intended as an aid in the diagnosis of influenza from Nasopharyngeal swab specimens and should not be used as a sole basis for treatment. Nasal washings and aspirates are unacceptable for Xpert Xpress SARS-CoV-2/FLU/RSV testing.  Fact Sheet for Patients: BloggerCourse.com  Fact Sheet for Healthcare Providers: SeriousBroker.it  This test is not yet approved or cleared by the [place]  FDA and has been authorized for detection and/or diagnosis of SARS-CoV-2 by FDA under an Emergency Use Authorization (EUA). This EUA will remain in effect (meaning this test can be used) for the duration of the COVID-19 declaration under Section 564(b)(1) of the Act, 21 U.S.C. section 360bbb-3(b)(1), unless the authorization is terminated or revoked.     Resp Syncytial Virus by PCR NEGATIVE NEGATIVE Final    Comment: (NOTE) Fact Sheet for Patients: BloggerCourse.com  Fact Sheet for Healthcare Providers: SeriousBroker.it  This test is not yet approved or cleared by the [place]  FDA and has been authorized for detection and/or diagnosis of SARS-CoV-2 by FDA under an Emergency Use Authorization (EUA). This EUA will remain in  effect (meaning this test can be used) for the duration of the COVID-19 declaration under Section 564(b)(1) of the Act, 21 U.S.C. section 360bbb-3(b)(1), unless the authorization is terminated or revoked.  Performed at [facility], [address] [place], [place] [address]   Urine Culture     Status: Abnormal   Collection Time: 07/09/23 12:52 PM   Specimen: Urine, Random  Result Value Ref  Range Status   Specimen Description   Final    URINE, RANDOM Performed at [facility], [address], [place], [place] [address]    Special Requests   Final    NONE Reflexed from [id number] Performed at [facility], [address], [place], [place] [address]    Culture MULTIPLE SPECIES PRESENT, SUGGEST RECOLLECTION (A)  Final   Report Status 07/10/2023 FINAL  Final  Blood Culture (routine x 2)     Status: Abnormal   Collection Time: 07/09/23  1:14 PM   Specimen: Right Antecubital; Blood  Result Value Ref Range Status   Specimen Description   Final    RIGHT ANTECUBITAL BOTTLES DRAWN AEROBIC AND ANAEROBIC Performed at [facility], [address] [place], [place] [address]    Special Requests   Final    Blood Culture adequate volume Performed at [facility], [address] [place], [place] [address]    Culture  Setup Time   Final    IN BOTH AEROBIC AND ANAEROBIC BOTTLES GRAM POSITIVE COCCI Gram Stain Report Called to,Read Back By and Verified With: J MAKAR AT 0836 07/11/23 BY A WILSON    Culture (A)  Final    STAPHYLOCOCCUS AUREUS SUSCEPTIBILITIES PERFORMED ON PREVIOUS CULTURE WITHIN THE LAST 5 DAYS. Performed at [facility], [address], [place], [place] [address]    Report Status 07/13/2023 FINAL  Final  Blood Culture (routine x 2)     Status: Abnormal (Preliminary result)   Collection Time: 07/09/23  1:14 PM   Specimen: BLOOD RIGHT HAND  Result Value Ref Range Status   Specimen Description   Final    BLOOD RIGHT HAND BOTTLES DRAWN AEROBIC AND ANAEROBIC Performed at [facility], [address], [place], [place] [address]    Special Requests   Final    Blood Culture adequate volume Performed at [facility], [address] [place], [place] [address]    Culture  Setup Time   Final    GRAM POSITIVE COCCI IN BOTH AEROBIC AND ANAEROBIC BOTTLES Gram Stain Report Called to,Read Back By and Verified With: T LENARD AT 1350 ON [id number] BY S [place] CRITICAL RESULT  CALLED TO, READ BACK BY AND VERIFIED WITH: J ATAMBILE RN 07/10/2023 @ 2259 BY AB    Culture (A)  Final    METHICILLIN RESISTANT STAPHYLOCOCCUS AUREUS Sent to [facility] for further susceptibility testing. Performed at [facility], [address] [place], [place] [address]    Report Status PENDING  Incomplete   Organism ID, Bacteria METHICILLIN RESISTANT STAPHYLOCOCCUS AUREUS  Final      Susceptibility   Methicillin resistant staphylococcus aureus - MIC*    CIPROFLOXACIN  <=0.5 SENSITIVE Sensitive     ERYTHROMYCIN >=8 RESISTANT Resistant     GENTAMICIN <=0.5 SENSITIVE Sensitive     OXACILLIN >=4 RESISTANT Resistant     TETRACYCLINE <=1 SENSITIVE Sensitive     VANCOMYCIN  1 SENSITIVE Sensitive     TRIMETH /SULFA  <=10 SENSITIVE Sensitive     CLINDAMYCIN <=0.25 SENSITIVE Sensitive     RIFAMPIN <=0.5 SENSITIVE Sensitive  Inducible Clindamycin NEGATIVE Sensitive     LINEZOLID 2 SENSITIVE Sensitive     * METHICILLIN RESISTANT STAPHYLOCOCCUS AUREUS  Blood Culture ID Panel (Reflexed)     Status: Abnormal   Collection Time: 07/09/23  1:14 PM  Result Value Ref Range Status   Enterococcus faecalis NOT DETECTED NOT DETECTED Final   Enterococcus Faecium NOT DETECTED NOT DETECTED Final   Listeria monocytogenes NOT DETECTED NOT DETECTED Final   Staphylococcus species DETECTED (A) NOT DETECTED Final    Comment: CRITICAL RESULT CALLED TO, READ BACK BY AND VERIFIED WITH: J ATAMBILE RN 07/10/2023 @ 2259 BY AB    Staphylococcus aureus (BCID) DETECTED (A) NOT DETECTED Final    Comment: Methicillin (oxacillin)-resistant Staphylococcus aureus (MRSA). MRSA is predictably resistant to beta-lactam antibiotics (except ceftaroline). Preferred therapy is vancomycin  unless clinically contraindicated. Patient requires contact precautions if  hospitalized. CRITICAL RESULT CALLED TO, READ BACK BY AND VERIFIED WITH: J ATAMBILE RN 07/10/2023 @ 2259 BY AB    Staphylococcus epidermidis NOT DETECTED NOT DETECTED  Final   Staphylococcus lugdunensis NOT DETECTED NOT DETECTED Final   Streptococcus species NOT DETECTED NOT DETECTED Final   Streptococcus agalactiae NOT DETECTED NOT DETECTED Final   Streptococcus pneumoniae NOT DETECTED NOT DETECTED Final   Streptococcus pyogenes NOT DETECTED NOT DETECTED Final   A.calcoaceticus-baumannii NOT DETECTED NOT DETECTED Final   Bacteroides fragilis NOT DETECTED NOT DETECTED Final   Enterobacterales NOT DETECTED NOT DETECTED Final   Enterobacter cloacae complex NOT DETECTED NOT DETECTED Final   Escherichia coli NOT DETECTED NOT DETECTED Final   Klebsiella aerogenes NOT DETECTED NOT DETECTED Final   Klebsiella oxytoca NOT DETECTED NOT DETECTED Final   Klebsiella pneumoniae NOT DETECTED NOT DETECTED Final   Proteus species NOT DETECTED NOT DETECTED Final   Salmonella species NOT DETECTED NOT DETECTED Final   Serratia marcescens NOT DETECTED NOT DETECTED Final   Haemophilus influenzae NOT DETECTED NOT DETECTED Final   Neisseria meningitidis NOT DETECTED NOT DETECTED Final   Pseudomonas aeruginosa NOT DETECTED NOT DETECTED Final   Stenotrophomonas maltophilia NOT DETECTED NOT DETECTED Final   Candida albicans NOT DETECTED NOT DETECTED Final   Candida auris NOT DETECTED NOT DETECTED Final   Candida glabrata NOT DETECTED NOT DETECTED Final   Candida krusei NOT DETECTED NOT DETECTED Final   Candida parapsilosis NOT DETECTED NOT DETECTED Final   Candida tropicalis NOT DETECTED NOT DETECTED Final   Cryptococcus neoformans/gattii NOT DETECTED NOT DETECTED Final   Meth resistant mecA/C and MREJ DETECTED (A) NOT DETECTED Final    Comment: CRITICAL RESULT CALLED TO, READ BACK BY AND VERIFIED WITH: J ATAMBILE RN 07/10/2023 @ 2259 BY AB Performed at [facility], [address], [place], [place] [address]   Culture, blood (Routine X 2) w Reflex to ID Panel     Status: None (Preliminary result)   Collection Time: 07/13/23 10:39 AM   Specimen: BLOOD  Result Value  Ref Range Status   Specimen Description BLOOD BLOOD RIGHT HAND  Final   Special Requests   Final    BOTTLES DRAWN AEROBIC AND ANAEROBIC Blood Culture adequate volume   Culture   Final    NO GROWTH 2 DAYS Performed at [facility], [address] [place], [place] [address]    Report Status PENDING  Incomplete  Culture, blood (Routine X 2) w Reflex to ID Panel     Status: None (Preliminary result)   Collection Time: 07/13/23 10:41 AM   Specimen: BLOOD  Result Value Ref Range  Status   Specimen Description BLOOD BLOOD RIGHT HAND  Final   Special Requests   Final    BOTTLES DRAWN AEROBIC AND ANAEROBIC Blood Culture adequate volume   Culture   Final    NO GROWTH 2 DAYS Performed at [facility], [address] [place], [place] [address]    Report Status PENDING  Incomplete  MRSA Next Gen by PCR, Nasal     Status: Abnormal   Collection Time: 07/13/23  1:28 PM   Specimen: Nasal Mucosa; Nasal Swab  Result Value Ref Range Status   MRSA by PCR Next Gen DETECTED (A) NOT DETECTED Final    Comment: RESULT CALLED TO, READ BACK BY AND VERIFIED WITH: L CLARKSON AT 1627 07/13/23 BY A WILSON (NOTE) The GeneXpert MRSA Assay (FDA approved for NASAL specimens only), is one component of a comprehensive MRSA colonization surveillance program. It is not intended to diagnose MRSA infection nor to guide or monitor treatment for MRSA infections. Test performance is not FDA approved in patients less than 32 years old. Performed at [facility], [address], [place], [place] [address]     Labs: CBC: Recent Labs  Lab 07/09/23 1314 07/10/23 0342 07/11/23 0404 07/11/23 0803 07/12/23 0409 07/13/23 0500  WBC 10.9* 8.9 9.4  --  9.6 8.7  NEUTROABS 9.3*  --   --   --   --   --   HGB 7.6* 7.0* 6.5* 6.6* 7.5* 8.0*  HCT 23.8* 21.6* 20.9* 19.9* 23.5* 25.5*  MCV 86.9 85.7 85.3  --  86.1 85.9  PLT 412* 387 351  --  380 394   Basic Metabolic Panel: Recent Labs  Lab 07/09/23 1314 07/10/23 0342  07/11/23 0404 07/13/23 0500 07/14/23 1013  NA 124* 128* 131* 134* 131*  K 4.5 4.2 3.7 3.7 4.2  CL 94* 101 105 103 101  CO2 19* 21* 23 22 20*  GLUCOSE 228* 147* 168* 168* 238*  BUN 36* 28* 22 20 22   CREATININE 2.09* 1.57* 1.19 1.05 1.20  CALCIUM  7.9* 7.7* 7.8* 7.8* 8.1*  PHOS  --   --  2.5  --   --    Liver Function Tests: Recent Labs  Lab 07/09/23 1314 07/11/23 0404  AST 26  --   ALT 20  --   ALKPHOS 105  --   BILITOT 0.9  --   PROT 7.9  --   ALBUMIN  1.7* <1.5*   CBG: Recent Labs  Lab 07/14/23 1118 07/14/23 1615 07/14/23 2234 07/15/23 0724 07/15/23 1121  GLUCAP 255* 124* 168* 128* 242*    Discharge time spent: greater than 30 minutes.  Signed: Justina Oman, MD [facility] 07/15/2023

## 2023-07-15 NOTE — Progress Notes (Signed)
 This note is for abtx guidance. I have not seen the patient:  68yo M with MRSA bacteremia, of unknown source. TTE negative. Bacteremia cleared on repeat blood cx. Source include urinary source-chronic foley (ur cx had mixed flora) vs eschar pressure wounds. Patient declined TEE.  Recommend 4 wk of IV daptomycin using 5/21 as day 1 of treatment. See opat orders.   We will see back in person in our clinic as initial visit ----------------------  Diagnosis: MRSA bacteremia  Culture Result: MRSA  No Known Allergies  OPAT Orders Discharge antibiotics to be given via PICC line Discharge antibiotics: Per pharmacy protocol daptomycin  Duration: 4 wk End Date: 08/10/2023  [facility] Care Per Protocol:  Home health RN for IV administration and teaching; PICC line care and labs.    Labs weekly while on IV antibiotics: _x_ CBC with differential _x_ BMP _x_ CK  _x_ Please pull PIC at completion of IV antibiotics  Fax weekly labs to [phone]  Clinic Follow Up Appt: In 3-4 wk  @ [facility]

## 2023-07-15 NOTE — Care Management Important Message (Signed)
 Important Message  Patient Details  Name: [name] MRN: [id number] Date of Birth: [birth year]   Important Message Given:  Yes - Medicare IM (reviewed letter with family member [name] at [phone])     [name] 07/15/2023, 11:52 AM

## 2023-07-15 NOTE — Progress Notes (Signed)
 Mobility Specialist Progress Note:    07/15/23 1130  Mobility  Activity Stood at bedside;Transferred from chair to bed  Level of Assistance Maximum assist, patient does 25-49%  Assistive Device Front wheel walker  Distance Ambulated (ft) 3 ft  Range of Motion/Exercises Active;All extremities  Activity Response Tolerated well  Mobility Referral Yes  Mobility visit 1 Mobility  Mobility Specialist Start Time (ACUTE ONLY) 1130  Mobility Specialist Stop Time (ACUTE ONLY) 1150  Mobility Specialist Time Calculation (min) (ACUTE ONLY) 20 min   Pt received in chair, requesting assistance to bed. NT in room, required maxA to stand and pivot with RW. Tolerated well,asx throughout.   Dajion Bickford Mobility Specialist Please contact via Special educational needs teacher or  Rehab office at [phone]

## 2023-07-15 NOTE — TOC Transition Note (Signed)
 Transition of Care [facility]) - Discharge Note   Patient Details  Name: [name] MRN: [id number] Date of Birth: 18-Nov-1955  Transition of Care [facility]) CM/SW Contact:  Cyndie Dredge, LCSWA Phone Number: 07/15/2023, 2:22 PM   Clinical Narrative:     Patient is scheduled to DC today . [facility] is following for home IV antibiotics. Writer spoke with Oralee Billow with [facility] and informed her that MD is DC patient today. Pam shared that she was on her way to meet with family and needed all orders signed by MD. MD aware. Patient HH agency for HHPT and RN will be through [facility] - spoke with artavia and she confirmed. TOC signing off.   Final next level of care: Home w Home Health Services Barriers to Discharge: Continued Medical Work up   Patient Goals and CMS Choice Patient states their goals for this hospitalization and ongoing recovery are:: DC home CMS Medicare.gov Compare Post Acute Care list provided to:: Patient Represenative (must comment) Choice offered to / list presented to : Sibling      Discharge Placement                  Name of family member notified: Nephew Patient and family notified of of transfer: 07/15/23  Discharge Plan and Services Additional resources added to the After Visit Summary for   In-house Referral: Clinical Social Work   Post Acute Care Choice: Durable Medical Equipment          DME Arranged: N/A DME Agency: NA       HH Arranged: PT HH Agency: [facility] Date HH Agency Contacted: 07/15/23 Time HH Agency Contacted: 1421 Representative spoke with at [facility] Agency: Renetta Carter  Social Drivers of Health (SDOH) Interventions SDOH Screenings   Food Insecurity: No Food Insecurity (07/09/2023)  Housing: Low Risk  (07/09/2023)  Transportation Needs: No Transportation Needs (07/09/2023)  Utilities: Not At Risk (07/09/2023)  Financial Resource Strain: Medium Risk (05/19/2022)   Received from [facility], [facility]  Social  Connections: Unknown (07/10/2023)  Tobacco Use: Medium Risk (07/09/2023)  Health Literacy: High Risk (02/24/2021)   Received from [facility], [facility]     Readmission Risk Interventions    07/14/2023   12:12 PM 07/13/2023    1:18 PM  Readmission Risk Prevention Plan  Transportation Screening Complete Complete  HRI or Home Care Consult Complete Complete  Social Work Consult for Recovery Care Planning/Counseling Complete Complete  Palliative Care Screening Not Applicable Not Applicable  Medication Review Oceanographer) Complete Complete

## 2023-07-15 NOTE — Progress Notes (Signed)
 Mobility Specialist Progress Note:    07/15/23 0940  Mobility  Activity Transferred from bed to chair;Stood at bedside  Level of Assistance Maximum assist, patient does 25-49%  Assistive Device Front wheel walker  Distance Ambulated (ft) 3 ft  Range of Motion/Exercises Active;All extremities  Activity Response Tolerated well  Mobility Referral Yes  Mobility visit 1 Mobility  Mobility Specialist Start Time (ACUTE ONLY) 0940  Mobility Specialist Stop Time (ACUTE ONLY) 1000  Mobility Specialist Time Calculation (min) (ACUTE ONLY) 20 min   Pt received in bed, NT requested assistance transferring to chair. MaxA to stand and pivot with RW. Tolerated well,asx throughout. Alarm on, call bell in reach. All needs met.  Marieelena Bartko Mobility Specialist Please contact via Special educational needs teacher or  Rehab office at [phone]

## 2023-07-15 NOTE — Plan of Care (Signed)
   Problem: Education: Goal: Knowledge of General Education information will improve Description: Including pain rating scale, medication(s)/side effects and non-pharmacologic comfort measures Outcome: Progressing   Problem: Health Behavior/Discharge Planning: Goal: Ability to manage health-related needs will improve Outcome: Progressing   Problem: Clinical Measurements: Goal: Ability to maintain clinical measurements within normal limits will improve Outcome: Progressing Goal: Will remain free from infection Outcome: Progressing Goal: Diagnostic test results will improve Outcome: Progressing Goal: Respiratory complications will improve Outcome: Progressing Goal: Cardiovascular complication will be avoided Outcome: Progressing   Problem: Activity: Goal: Risk for activity intolerance will decrease Outcome: Progressing   Problem: Nutrition: Goal: Adequate nutrition will be maintained Outcome: Progressing   Problem: Coping: Goal: Level of anxiety will decrease Outcome: Progressing   Problem: Elimination: Goal: Will not experience complications related to bowel motility Outcome: Progressing Goal: Will not experience complications related to urinary retention Outcome: Progressing   Problem: Pain Managment: Goal: General experience of comfort will improve and/or be controlled Outcome: Progressing   Problem: Safety: Goal: Ability to remain free from injury will improve Outcome: Progressing   Problem: Skin Integrity: Goal: Risk for impaired skin integrity will decrease Outcome: Progressing   Problem: Education: Goal: Ability to describe self-care measures that may prevent or decrease complications (Diabetes Survival Skills Education) will improve Outcome: Progressing Goal: Individualized Educational Video(s) Outcome: Progressing   Problem: Coping: Goal: Ability to adjust to condition or change in health will improve Outcome: Progressing   Problem: Fluid  Volume: Goal: Ability to maintain a balanced intake and output will improve Outcome: Progressing   Problem: Health Behavior/Discharge Planning: Goal: Ability to identify and utilize available resources and services will improve Outcome: Progressing Goal: Ability to manage health-related needs will improve Outcome: Progressing   Problem: Metabolic: Goal: Ability to maintain appropriate glucose levels will improve Outcome: Progressing   Problem: Nutritional: Goal: Maintenance of adequate nutrition will improve Outcome: Progressing Goal: Progress toward achieving an optimal weight will improve Outcome: Progressing   Problem: Skin Integrity: Goal: Risk for impaired skin integrity will decrease Outcome: Progressing   Problem: Tissue Perfusion: Goal: Adequacy of tissue perfusion will improve Outcome: Progressing   Problem: Education: Goal: Ability to describe self-care measures that may prevent or decrease complications (Diabetes Survival Skills Education) will improve Outcome: Progressing Goal: Individualized Educational Video(s) Outcome: Progressing   Problem: Coping: Goal: Ability to adjust to condition or change in health will improve Outcome: Progressing   Problem: Fluid Volume: Goal: Ability to maintain a balanced intake and output will improve Outcome: Progressing   Problem: Health Behavior/Discharge Planning: Goal: Ability to identify and utilize available resources and services will improve Outcome: Progressing Goal: Ability to manage health-related needs will improve Outcome: Progressing   Problem: Metabolic: Goal: Ability to maintain appropriate glucose levels will improve Outcome: Progressing   Problem: Nutritional: Goal: Maintenance of adequate nutrition will improve Outcome: Progressing Goal: Progress toward achieving an optimal weight will improve Outcome: Progressing   Problem: Skin Integrity: Goal: Risk for impaired skin integrity will  decrease Outcome: Progressing   Problem: Tissue Perfusion: Goal: Adequacy of tissue perfusion will improve Outcome: Progressing

## 2023-07-15 NOTE — Progress Notes (Signed)
 SATURATION QUALIFICATIONS: (This note is used to comply with regulatory documentation for home oxygen)  Patient Saturations on Room Air at Rest = 99%  Patient Saturations on Room Air while Ambulating = 97%  Patient Saturations on 0 Liters of oxygen while Ambulating = 97%  Please briefly explain why patient needs home oxygen: patient did not demonstrate need to have oxygen at this time.

## 2023-07-16 DIAGNOSIS — A4102 Sepsis due to Methicillin resistant Staphylococcus aureus: Secondary | ICD-10-CM | POA: Diagnosis not present

## 2023-07-17 ENCOUNTER — Encounter (HOSPITAL_COMMUNITY): Payer: Self-pay | Admitting: Emergency Medicine

## 2023-07-17 ENCOUNTER — Emergency Department (HOSPITAL_COMMUNITY)
Admission: EM | Admit: 2023-07-17 | Discharge: 2023-07-17 | Disposition: A | Discharge status: Home / Self Care | Attending: Emergency Medicine | Admitting: Emergency Medicine

## 2023-07-17 ENCOUNTER — Other Ambulatory Visit: Payer: Self-pay

## 2023-07-17 DIAGNOSIS — T839XXA Unspecified complication of genitourinary prosthetic device, implant and graft, initial encounter: Secondary | ICD-10-CM

## 2023-07-17 DIAGNOSIS — T83091A Other mechanical complication of indwelling urethral catheter, initial encounter: Secondary | ICD-10-CM | POA: Insufficient documentation

## 2023-07-17 DIAGNOSIS — Y732 Prosthetic and other implants, materials and accessory gastroenterology and urology devices associated with adverse incidents: Secondary | ICD-10-CM | POA: Insufficient documentation

## 2023-07-17 DIAGNOSIS — R1084 Generalized abdominal pain: Secondary | ICD-10-CM | POA: Diagnosis present

## 2023-07-17 DIAGNOSIS — I4891 Unspecified atrial fibrillation: Secondary | ICD-10-CM | POA: Diagnosis not present

## 2023-07-17 HISTORY — DX: Unspecified dementia, unspecified severity, without behavioral disturbance, psychotic disturbance, mood disturbance, and anxiety: F03.90

## 2023-07-17 NOTE — ED Notes (Signed)
 Bladder Scanned Patient, showed 1,649ml in bladder. Flushed superpubic catheter, drained into a urinal, reconnected to foley bag. Catheter is currently draining, will continue to monitor.

## 2023-07-17 NOTE — ED Triage Notes (Signed)
 Per caregiver urine is coming out around catheter and not going into bag. Pt states it feels like he has to urinate.

## 2023-07-17 NOTE — ED Notes (Signed)
 Contacted [name] [place]) Is coming to pick up patient to transport home.

## 2023-07-17 NOTE — Discharge Instructions (Signed)
 Follow-up with your urologist for any more problems

## 2023-07-17 NOTE — ED Provider Notes (Signed)
 [place] EMERGENCY DEPARTMENT AT [facility] Provider Note   CSN: [id number] Arrival date & time: 07/17/23  1719     History {Add pertinent medical, surgical, social history, OB history to HPI:1} Chief Complaint  Patient presents with   Suprapubic Catheter Issue    [name] is a 68 y.o. male.  Patient has a suprapubic catheter that is not draining.  He was sent here by his nurse   Abdominal Pain      Home Medications Prior to Admission medications   Medication Sig Start Date End Date Taking? Authorizing Provider  ascorbic acid (VITAMIN C) 500 MG tablet Take 1 tablet (500 mg total) by mouth daily. 07/15/23   Justina Oman, MD  benztropine  (COGENTIN ) 2 MG tablet Take 1 tablet (2 mg total) by mouth at bedtime. Patient taking differently: Take 2 mg by mouth 2 (two) times daily. 09/28/12   Ferrel Hsu, NP  Chlorhexidine  Gluconate Cloth 2 % PADS Apply 6 each topically daily for 3 days. 07/15/23 07/18/23  Justina Oman, MD  cyanocobalamin  1000 MCG tablet Take 1 tablet (1,000 mcg total) by mouth daily. 07/15/23   Justina Oman, MD  daptomycin (CUBICIN) IVPB Inject 700 mg into the vein daily for 26 days. Indication:  MRSA bacteremia First Dose: Yes Last Day of Therapy:  08/10/23 Labs - Once weekly:  CBC/D, BMP, and CPK Labs - Once weekly: ESR and CRP Method of administration: IV Push Please pull PIC at completion of IV antibiotics Method of administration may be changed at the discretion of home infusion pharmacist based upon assessment of the patient and/or caregiver's ability to self-administer the medication ordered. 07/15/23 08/10/23  Liane Redman, MD  donepezil (ARICEPT) 5 MG tablet Take 5 mg by mouth daily. 04/17/21   [provider]  HYDROcodone-acetaminophen  (NORCO/VICODIN) 5-325 MG tablet Take 1 tablet by mouth every 6 (six) hours as needed for moderate pain (pain score 4-6). 07/08/23   [provider]  levothyroxine  (SYNTHROID ,  LEVOTHROID) 75 MCG tablet Take 1 tablet (75 mcg total) by mouth daily. 09/28/12   Ferrel Hsu, NP  metFORMIN (GLUCOPHAGE-XR) 500 MG 24 hr tablet Take 500 mg by mouth daily. 03/13/21   [provider]  mupirocin ointment (BACTROBAN) 2 % Place 1 Application into the nose 2 (two) times daily for 3 days. 07/15/23 07/18/23  Justina Oman, MD  omeprazole  (PRILOSEC) 20 MG capsule Take 1 capsule (20 mg total) by mouth daily. 09/28/12   Ferrel Hsu, NP  oxybutynin (DITROPAN) 5 MG tablet Take 1 tablet (5 mg total) by mouth 3 (three) times daily. 07/15/23   Justina Oman, MD  paliperidone  (INVEGA ) 3 MG 24 hr tablet Take 1 tablet by mouth 2 (two) times daily. 03/13/21   [provider]  polyethylene glycol (MIRALAX  / GLYCOLAX ) 17 g packet Take 17 g by mouth 2 (two) times daily. Patient taking differently: Take 17 g by mouth 2 (two) times daily as needed for mild constipation. 03/20/21   Graciela Lava, MD  QUEtiapine  (SEROQUEL ) 200 MG tablet Take 600 mg by mouth at bedtime. 03/13/21   [provider]  rosuvastatin  (CRESTOR ) 5 MG tablet Take 1 tablet (5 mg total) by mouth once a week. Take 5 mg by mouth every week on Friday (please hold medication until August 16, 2023 to minimize risk of rhabdomyolysis while using daptomycin). 08/13/23   Justina Oman, MD      Allergies    Patient has no known allergies.  Review of Systems   Review of Systems  Gastrointestinal:  Positive for abdominal pain.    Physical Exam Updated Vital Signs BP (!) 112/56   Pulse 97   Temp (!) 97 F (36.1 C) (Oral)   Resp 17   Ht 5\' 10"  (1.778 m)   Wt 87 kg   SpO2 97%   BMI 27.52 kg/m  Physical Exam  ED Results / Procedures / Treatments   Labs (all labs ordered are listed, but only abnormal results are displayed) Labs Reviewed - No data to display  EKG None  Radiology No results found.  Procedures Procedures  {Document cardiac monitor, telemetry assessment procedure when  appropriate:1}  Medications Ordered in ED Medications - No data to display  ED Course/ Medical Decision Making/ A&P   {Suprapubic catheter was irrigated and then started draining fine. Click here for ABCD2, HEART and other calculatorsREFRESH Note before signing :1}                              Medical Decision Making  Patient with a clogged suprapubic catheter that has been fixed.  He will be discharged home  {Document critical care time when appropriate:1} {Document review of labs and clinical decision tools ie heart score, Chads2Vasc2 etc:1}  {Document your independent review of radiology images, and any outside records:1} {Document your discussion with family members, caretakers, and with consultants:1} {Document social determinants of health affecting pt's care:1} {Document your decision making why or why not admission, treatments were needed:1} Final Clinical Impression(s) / ED Diagnoses Final diagnoses:  Problem with Foley catheter, initial encounter [facility])    Rx / DC Orders ED Discharge Orders     None

## 2023-07-18 DIAGNOSIS — Z7984 Long term (current) use of oral hypoglycemic drugs: Secondary | ICD-10-CM | POA: Diagnosis not present

## 2023-07-18 DIAGNOSIS — N390 Urinary tract infection, site not specified: Secondary | ICD-10-CM | POA: Diagnosis not present

## 2023-07-18 DIAGNOSIS — K5900 Constipation, unspecified: Secondary | ICD-10-CM | POA: Diagnosis not present

## 2023-07-18 DIAGNOSIS — Z87891 Personal history of nicotine dependence: Secondary | ICD-10-CM | POA: Diagnosis not present

## 2023-07-18 DIAGNOSIS — Z556 Problems related to health literacy: Secondary | ICD-10-CM | POA: Diagnosis not present

## 2023-07-18 DIAGNOSIS — E871 Hypo-osmolality and hyponatremia: Secondary | ICD-10-CM | POA: Diagnosis not present

## 2023-07-18 DIAGNOSIS — Z791 Long term (current) use of non-steroidal anti-inflammatories (NSAID): Secondary | ICD-10-CM | POA: Diagnosis not present

## 2023-07-18 DIAGNOSIS — E119 Type 2 diabetes mellitus without complications: Secondary | ICD-10-CM | POA: Diagnosis not present

## 2023-07-18 LAB — CULTURE, BLOOD (ROUTINE X 2)
Culture: NO GROWTH
Culture: NO GROWTH
Special Requests: ADEQUATE
Special Requests: ADEQUATE

## 2023-07-20 DIAGNOSIS — N390 Urinary tract infection, site not specified: Secondary | ICD-10-CM | POA: Diagnosis not present

## 2023-07-20 LAB — CULTURE, BLOOD (ROUTINE X 2): Special Requests: ADEQUATE

## 2023-07-20 LAB — MINIMUM INHIBITORY CONC. (1 DRUG)

## 2023-07-20 LAB — MIC RESULT

## 2023-07-23 DIAGNOSIS — A4102 Sepsis due to Methicillin resistant Staphylococcus aureus: Secondary | ICD-10-CM | POA: Diagnosis not present

## 2023-07-25 DIAGNOSIS — K5900 Constipation, unspecified: Secondary | ICD-10-CM | POA: Diagnosis not present

## 2023-07-25 DIAGNOSIS — N390 Urinary tract infection, site not specified: Secondary | ICD-10-CM | POA: Diagnosis not present

## 2023-07-25 DIAGNOSIS — Z791 Long term (current) use of non-steroidal anti-inflammatories (NSAID): Secondary | ICD-10-CM | POA: Diagnosis not present

## 2023-07-25 DIAGNOSIS — Z556 Problems related to health literacy: Secondary | ICD-10-CM | POA: Diagnosis not present

## 2023-07-25 DIAGNOSIS — E119 Type 2 diabetes mellitus without complications: Secondary | ICD-10-CM | POA: Diagnosis not present

## 2023-07-25 DIAGNOSIS — Z7984 Long term (current) use of oral hypoglycemic drugs: Secondary | ICD-10-CM | POA: Diagnosis not present

## 2023-07-25 DIAGNOSIS — N179 Acute kidney failure, unspecified: Secondary | ICD-10-CM | POA: Diagnosis not present

## 2023-07-25 DIAGNOSIS — E871 Hypo-osmolality and hyponatremia: Secondary | ICD-10-CM | POA: Diagnosis not present

## 2023-07-25 DIAGNOSIS — Z87891 Personal history of nicotine dependence: Secondary | ICD-10-CM | POA: Diagnosis not present

## 2023-07-26 DIAGNOSIS — E119 Type 2 diabetes mellitus without complications: Secondary | ICD-10-CM | POA: Diagnosis not present

## 2023-07-26 DIAGNOSIS — Z791 Long term (current) use of non-steroidal anti-inflammatories (NSAID): Secondary | ICD-10-CM | POA: Diagnosis not present

## 2023-07-26 DIAGNOSIS — E871 Hypo-osmolality and hyponatremia: Secondary | ICD-10-CM | POA: Diagnosis not present

## 2023-07-26 DIAGNOSIS — N390 Urinary tract infection, site not specified: Secondary | ICD-10-CM | POA: Diagnosis not present

## 2023-07-26 DIAGNOSIS — K5900 Constipation, unspecified: Secondary | ICD-10-CM | POA: Diagnosis not present

## 2023-07-26 DIAGNOSIS — Z87891 Personal history of nicotine dependence: Secondary | ICD-10-CM | POA: Diagnosis not present

## 2023-07-26 DIAGNOSIS — Z556 Problems related to health literacy: Secondary | ICD-10-CM | POA: Diagnosis not present

## 2023-07-26 DIAGNOSIS — Z7984 Long term (current) use of oral hypoglycemic drugs: Secondary | ICD-10-CM | POA: Diagnosis not present

## 2023-07-28 DIAGNOSIS — E119 Type 2 diabetes mellitus without complications: Secondary | ICD-10-CM | POA: Diagnosis not present

## 2023-07-28 DIAGNOSIS — Z791 Long term (current) use of non-steroidal anti-inflammatories (NSAID): Secondary | ICD-10-CM | POA: Diagnosis not present

## 2023-07-28 DIAGNOSIS — Z87891 Personal history of nicotine dependence: Secondary | ICD-10-CM | POA: Diagnosis not present

## 2023-07-28 DIAGNOSIS — Z7984 Long term (current) use of oral hypoglycemic drugs: Secondary | ICD-10-CM | POA: Diagnosis not present

## 2023-07-28 DIAGNOSIS — K5900 Constipation, unspecified: Secondary | ICD-10-CM | POA: Diagnosis not present

## 2023-07-28 DIAGNOSIS — N390 Urinary tract infection, site not specified: Secondary | ICD-10-CM | POA: Diagnosis not present

## 2023-07-28 DIAGNOSIS — E871 Hypo-osmolality and hyponatremia: Secondary | ICD-10-CM | POA: Diagnosis not present

## 2023-07-28 DIAGNOSIS — Z556 Problems related to health literacy: Secondary | ICD-10-CM | POA: Diagnosis not present

## 2023-07-29 DIAGNOSIS — M2170 Unequal limb length (acquired), unspecified site: Secondary | ICD-10-CM | POA: Diagnosis not present

## 2023-07-29 DIAGNOSIS — M25559 Pain in unspecified hip: Secondary | ICD-10-CM | POA: Diagnosis not present

## 2023-07-29 DIAGNOSIS — I10 Essential (primary) hypertension: Secondary | ICD-10-CM | POA: Diagnosis not present

## 2023-07-30 DIAGNOSIS — A4102 Sepsis due to Methicillin resistant Staphylococcus aureus: Secondary | ICD-10-CM | POA: Diagnosis not present

## 2023-08-01 DIAGNOSIS — Z87891 Personal history of nicotine dependence: Secondary | ICD-10-CM | POA: Diagnosis not present

## 2023-08-01 DIAGNOSIS — E119 Type 2 diabetes mellitus without complications: Secondary | ICD-10-CM | POA: Diagnosis not present

## 2023-08-01 DIAGNOSIS — Z7984 Long term (current) use of oral hypoglycemic drugs: Secondary | ICD-10-CM | POA: Diagnosis not present

## 2023-08-01 DIAGNOSIS — N390 Urinary tract infection, site not specified: Secondary | ICD-10-CM | POA: Diagnosis not present

## 2023-08-01 DIAGNOSIS — N179 Acute kidney failure, unspecified: Secondary | ICD-10-CM | POA: Diagnosis not present

## 2023-08-01 DIAGNOSIS — K5900 Constipation, unspecified: Secondary | ICD-10-CM | POA: Diagnosis not present

## 2023-08-01 DIAGNOSIS — Z791 Long term (current) use of non-steroidal anti-inflammatories (NSAID): Secondary | ICD-10-CM | POA: Diagnosis not present

## 2023-08-01 DIAGNOSIS — E871 Hypo-osmolality and hyponatremia: Secondary | ICD-10-CM | POA: Diagnosis not present

## 2023-08-01 DIAGNOSIS — Z556 Problems related to health literacy: Secondary | ICD-10-CM | POA: Diagnosis not present

## 2023-08-01 NOTE — Progress Notes (Signed)
 Assessment:  1.  Neurogenic bladder with indwelling suprapubic tube-doing well with this  2.  Granulation tissue of tube entry site, cauterized at last visit, healed well  3.  Probable cellulitis-resolved  Plan: 1.  Continue monthly catheter changes  2.  I will see him in 6 months for quick check  History of Present Illness:   He presented to [facility] on 03/17/2021 with altered mental status.  He does have a history of schizophrenia.  He was found to have a distended abdomen on presentation.  A Foley catheter was placed with return of 2300 mL of urine.  CT imaging from 03/18/2021 demonstrated no stones or hydronephrosis, no renal masses, and a large stool burden throughout the colon.  He was started on Flomax .  He was treated for constipation.  His creatinine was 2.69 on admission and improved to 0.83 prior to discharge on 03/20/2021. At his initial visit on 03/27/2021, his Foley catheter was draining well.  He continued to have problems with constipation.  He was taking MiraLAX  and Senokot S.  No prior history of retention. His nephew reports that he has been pulling at the Foley catheter during periods of confusion. A voiding trial was attempted on 03/27/2021 but was unsuccessful and the catheter was replaced. The patient returned for another voiding trial on 04/06/2021.  He was unable to void in the office and returned later that day unable to void requiring replacement of a Foley catheter.  At his visit on 04/13/21, he continued with a foley catheter.  He continued to have issues with constipation although he had multiple bowel movements approximately 4 days prior.  No recent bowel movement.  He continues on MiraLAX  and Senokot.  He also continued to take Flomax  daily. He was continued with the Foley catheter.  He was changed to Rapaflo  8 mg daily in place of tamsulosin . Urine culture grew >100 K MRSA.  He was treated with Bactrim .  He was evaluated with cystoscopy on 04/29/2021.  Cystoscopy  showed no significant enlargement of the prostate, a large capacity bladder with few trabeculations, and mucosal edema consistent with chronic Foley catheter irritation.  He was unable to void following the procedure. He underwent evaluation with urodynamics on 05/25/2021.  The patient had a bladder capacity of 987 mL with decreased sensation.  There was instability noted with leakage.  The patient was able to generate a slight voluntary contraction and void a small amount with straining.  Max detrusor pressure was 20 cm of water with a max flow of 5 mL/s.  He underwent placement of a suprapubic catheter by interventional radiology on 06/23/2021. The patient pulled out his suprapubic catheter on 08/03/2021.  He underwent replacement of a suprapubic catheter by interventional radiology on 08/05/2021.  He again pulled out his catheter on 08/23/2021.  The catheter was replaced in the emergency department. Urine culture showed no growth. His SP tube was changed on 09/24/2021 with placement of a 16 French catheter.  He presents today for change of his suprapubic catheter.  His tube has been draining well.  No recent hematuria, fevers, or chills.  He continues to have some mucus drainage around the SP tube site.  11.12.2024: He was seen for redness around his catheter.  He had significant amount of granulation tissue at the suprapubic site which was cauterized.  He was placed on doxycycline .  12.10.2024: Here today for recheck.  He is no longer having tenderness or redness at the suprapubic tube site.  6.10.2025: Here  for catheter change and every 6 month check. Apparently he has been treated for 1 UTI since his visit with me 6 months ago.  No problems with catheter changes or recurrent granulation tissue.  Portions of the above documentation were copied from a prior visit for review purposes only.  Past Medical History:  Past Medical History:  Diagnosis Date   Dementia (HCC)    Schizophrenia (HCC)      Past Surgical History:  Past Surgical History:  Procedure Laterality Date   IR CATHETER TUBE CHANGE  08/05/2021   SHOULDER SURGERY      Allergies:  No Known Allergies  Family History:  No family history on file.  Social History:  Social History   Tobacco Use   Smoking status: Former    Current packs/day: 1.00    Types: Cigarettes  Vaping Use   Vaping status: Never Used  Substance Use Topics   Alcohol  use: Yes    Comment: occasionaly   Drug use: No    ROS: Constitutional:  Negative for fever, chills, weight loss CV: Negative for chest pain, previous MI, hypertension Respiratory:  Negative for shortness of breath, wheezing, sleep apnea, frequent cough GI:  Negative for nausea, vomiting, bloody stool, GERD   Physical exam: There were no vitals taken for this visit. GENERAL APPEARANCE:  Well appearing, well developed, well nourished, NAD HEENT:  Atraumatic, normocephalic, oropharynx clear NECK:  Supple without lymphadenopathy or thyromegaly ABDOMEN: Suprapubic tube site is clean.  No evidence of granulation tissue. EXTREMITIES:  Moves all extremities well, without clubbing, cyanosis, or edema NEUROLOGIC:  Alert and oriented x 3, CN II-XII grossly intact MENTAL STATUS:  appropriate BACK:  Non-tender to palpation, No CVAT SKIN:  Warm, dry, and intact

## 2023-08-02 ENCOUNTER — Ambulatory Visit (INDEPENDENT_AMBULATORY_CARE_PROVIDER_SITE_OTHER): Payer: 59 | Admitting: Urology

## 2023-08-02 VITALS — BP 107/69 | HR 103

## 2023-08-02 DIAGNOSIS — N319 Neuromuscular dysfunction of bladder, unspecified: Secondary | ICD-10-CM | POA: Diagnosis not present

## 2023-08-02 DIAGNOSIS — R339 Retention of urine, unspecified: Secondary | ICD-10-CM | POA: Diagnosis not present

## 2023-08-02 MED ORDER — CEPHALEXIN 500 MG PO CAPS
500.0000 mg | ORAL_CAPSULE | Freq: Once | ORAL | Status: AC
  Administered 2023-08-02: 500 mg via ORAL

## 2023-08-02 NOTE — Progress Notes (Unsigned)
 Suprapubic Cath Change  Patient is present today for a suprapubic catheter change due to urinary retention.  10ml of water was drained from the balloon, a 16FR foley cath was removed from the tract with out difficulty.  Suprapubic catheter site was cleaned and prepped in a sterile fashion with Betadinex3  A 16FR foley cath was replaced into the tract no complications were noted. Urine return was noted, urine Dark yellow in color . 10 ml of sterile water was inflated into the balloon and a bed bag was attached for drainage.  Patient tolerated well. A night bag was given to patient and proper instruction was given on how to switch bags.    Performed by: Melvenia Stabs. CMA  Follow up: 4 weeks

## 2023-08-04 DIAGNOSIS — N390 Urinary tract infection, site not specified: Secondary | ICD-10-CM | POA: Diagnosis not present

## 2023-08-04 DIAGNOSIS — Z7984 Long term (current) use of oral hypoglycemic drugs: Secondary | ICD-10-CM | POA: Diagnosis not present

## 2023-08-04 DIAGNOSIS — Z791 Long term (current) use of non-steroidal anti-inflammatories (NSAID): Secondary | ICD-10-CM | POA: Diagnosis not present

## 2023-08-04 DIAGNOSIS — K5900 Constipation, unspecified: Secondary | ICD-10-CM | POA: Diagnosis not present

## 2023-08-04 DIAGNOSIS — Z87891 Personal history of nicotine dependence: Secondary | ICD-10-CM | POA: Diagnosis not present

## 2023-08-04 DIAGNOSIS — Z556 Problems related to health literacy: Secondary | ICD-10-CM | POA: Diagnosis not present

## 2023-08-04 DIAGNOSIS — E871 Hypo-osmolality and hyponatremia: Secondary | ICD-10-CM | POA: Diagnosis not present

## 2023-08-04 DIAGNOSIS — E119 Type 2 diabetes mellitus without complications: Secondary | ICD-10-CM | POA: Diagnosis not present

## 2023-08-06 DIAGNOSIS — A4102 Sepsis due to Methicillin resistant Staphylococcus aureus: Secondary | ICD-10-CM | POA: Diagnosis not present

## 2023-08-08 DIAGNOSIS — E119 Type 2 diabetes mellitus without complications: Secondary | ICD-10-CM | POA: Diagnosis not present

## 2023-08-08 DIAGNOSIS — M2170 Unequal limb length (acquired), unspecified site: Secondary | ICD-10-CM | POA: Diagnosis not present

## 2023-08-08 DIAGNOSIS — N390 Urinary tract infection, site not specified: Secondary | ICD-10-CM | POA: Diagnosis not present

## 2023-08-08 DIAGNOSIS — M545 Low back pain, unspecified: Secondary | ICD-10-CM | POA: Diagnosis not present

## 2023-08-08 DIAGNOSIS — M87051 Idiopathic aseptic necrosis of right femur: Secondary | ICD-10-CM | POA: Diagnosis not present

## 2023-08-08 DIAGNOSIS — N179 Acute kidney failure, unspecified: Secondary | ICD-10-CM | POA: Diagnosis not present

## 2023-08-10 ENCOUNTER — Other Ambulatory Visit (HOSPITAL_COMMUNITY): Payer: Self-pay | Admitting: Sports Medicine

## 2023-08-10 DIAGNOSIS — M25551 Pain in right hip: Secondary | ICD-10-CM

## 2023-08-10 DIAGNOSIS — Z299 Encounter for prophylactic measures, unspecified: Secondary | ICD-10-CM | POA: Diagnosis not present

## 2023-08-10 DIAGNOSIS — Z0000 Encounter for general adult medical examination without abnormal findings: Secondary | ICD-10-CM | POA: Diagnosis not present

## 2023-08-10 DIAGNOSIS — E039 Hypothyroidism, unspecified: Secondary | ICD-10-CM | POA: Diagnosis not present

## 2023-08-10 DIAGNOSIS — D649 Anemia, unspecified: Secondary | ICD-10-CM | POA: Diagnosis not present

## 2023-08-10 DIAGNOSIS — I10 Essential (primary) hypertension: Secondary | ICD-10-CM | POA: Diagnosis not present

## 2023-08-10 DIAGNOSIS — Z7189 Other specified counseling: Secondary | ICD-10-CM | POA: Diagnosis not present

## 2023-08-10 DIAGNOSIS — R6889 Other general symptoms and signs: Secondary | ICD-10-CM | POA: Diagnosis not present

## 2023-08-11 DIAGNOSIS — R6889 Other general symptoms and signs: Secondary | ICD-10-CM | POA: Diagnosis not present

## 2023-08-11 NOTE — Progress Notes (Signed)
 1317-tolerated transfusion well, no c/o, discharged home, to car via [facility] Dkoger RN

## 2023-08-11 NOTE — Progress Notes (Signed)
 Pt to [facility] for blood transfusion of 2 units PRBC's. Pt is oriented to person and date of birth. 41 Given breakfast tray. 1008 1st unit PRBC's infused. Pt tolerated well. 1050 Pt resting quietly, given diet cola to drink.1230 Ate lunch. Pt's color much better than when he arrived. 1245 Spoke to Mead, pt's nephew, and asked him to be here at 1315 to take pt home.

## 2023-08-12 DIAGNOSIS — A4102 Sepsis due to Methicillin resistant Staphylococcus aureus: Secondary | ICD-10-CM | POA: Diagnosis not present

## 2023-08-16 ENCOUNTER — Inpatient Hospital Stay: Admitting: Internal Medicine

## 2023-08-17 ENCOUNTER — Encounter (INDEPENDENT_AMBULATORY_CARE_PROVIDER_SITE_OTHER): Payer: Self-pay | Admitting: *Deleted

## 2023-08-17 DIAGNOSIS — E119 Type 2 diabetes mellitus without complications: Secondary | ICD-10-CM | POA: Diagnosis not present

## 2023-08-17 DIAGNOSIS — E871 Hypo-osmolality and hyponatremia: Secondary | ICD-10-CM | POA: Diagnosis not present

## 2023-08-17 DIAGNOSIS — Z791 Long term (current) use of non-steroidal anti-inflammatories (NSAID): Secondary | ICD-10-CM | POA: Diagnosis not present

## 2023-08-17 DIAGNOSIS — Z87891 Personal history of nicotine dependence: Secondary | ICD-10-CM | POA: Diagnosis not present

## 2023-08-17 DIAGNOSIS — K5900 Constipation, unspecified: Secondary | ICD-10-CM | POA: Diagnosis not present

## 2023-08-17 DIAGNOSIS — Z556 Problems related to health literacy: Secondary | ICD-10-CM | POA: Diagnosis not present

## 2023-08-17 DIAGNOSIS — Z7984 Long term (current) use of oral hypoglycemic drugs: Secondary | ICD-10-CM | POA: Diagnosis not present

## 2023-08-17 DIAGNOSIS — N390 Urinary tract infection, site not specified: Secondary | ICD-10-CM | POA: Diagnosis not present

## 2023-08-18 ENCOUNTER — Ambulatory Visit (HOSPITAL_COMMUNITY)
Admission: RE | Admit: 2023-08-18 | Discharge: 2023-08-18 | Disposition: A | Discharge status: Home / Self Care | Source: Ambulatory Visit | Attending: Sports Medicine | Admitting: Sports Medicine

## 2023-08-18 DIAGNOSIS — M60051 Infective myositis, right thigh: Secondary | ICD-10-CM | POA: Diagnosis not present

## 2023-08-18 DIAGNOSIS — M25551 Pain in right hip: Secondary | ICD-10-CM | POA: Diagnosis not present

## 2023-08-18 DIAGNOSIS — M25451 Effusion, right hip: Secondary | ICD-10-CM | POA: Diagnosis not present

## 2023-08-18 MED ORDER — GADOBUTROL 1 MMOL/ML IV SOLN
10.0000 mL | Freq: Once | INTRAVENOUS | Status: AC | PRN
  Administered 2023-08-18: 10 mL via INTRAVENOUS

## 2023-08-22 ENCOUNTER — Encounter: Payer: Self-pay | Admitting: Internal Medicine

## 2023-08-22 ENCOUNTER — Ambulatory Visit: Admitting: Internal Medicine

## 2023-08-22 ENCOUNTER — Other Ambulatory Visit: Payer: Self-pay

## 2023-08-22 VITALS — BP 92/62 | HR 110 | Temp 97.7°F

## 2023-08-22 DIAGNOSIS — M8628 Subacute osteomyelitis, other site: Secondary | ICD-10-CM | POA: Diagnosis not present

## 2023-08-22 DIAGNOSIS — R7881 Bacteremia: Secondary | ICD-10-CM

## 2023-08-22 DIAGNOSIS — B9562 Methicillin resistant Staphylococcus aureus infection as the cause of diseases classified elsewhere: Secondary | ICD-10-CM

## 2023-08-22 MED ORDER — LINEZOLID 600 MG PO TABS: 600.0000 mg | ORAL_TABLET | Freq: Two times a day (BID) | ORAL | 0 refills | Status: DC

## 2023-08-22 NOTE — Progress Notes (Unsigned)
 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 M with Hx of dementia, schizophrenia, suprapubic catheter presents for hospital of MRSA bacteremia secondary to unclear source.  Blood cultures grew 2/2/MRSA daptomycin  sensitive on 5/17.  Repeat blood cultures on 5/21 clear.  TTE no vegetation.  Source suspected to be Foley versus chronic wounds with HR.  Patient discharged on daptomycin  x 4 weeks EOT 08/10/2023. PICC line is out.  Patient presents from skilled facility with a note from Dr. Orpha, requesting call.  Patient denies any new complaints.  Denies fevers or chills.   Review of Systems  All other systems reviewed and are negative.   Past Medical History:  Diagnosis Date   Dementia (HCC)    Schizophrenia (HCC)     Outpatient Medications Prior to Visit  Medication Sig Dispense Refill   ascorbic acid  (VITAMIN C ) 500 MG tablet Take 1 tablet (500 mg total) by mouth daily. 30 tablet 2   benztropine  (COGENTIN ) 2 MG tablet Take 1 tablet (2 mg total) by mouth at bedtime. (Patient taking differently: Take 2 mg by  mouth 2 (two) times daily.) 30 tablet 0   cyanocobalamin  1000 MCG tablet Take 1 tablet (1,000 mcg total) by mouth daily. 30 tablet 2   donepezil (ARICEPT) 5 MG tablet Take 5 mg by mouth daily.     HYDROcodone -acetaminophen  (NORCO/VICODIN) 5-325 MG tablet Take 1 tablet by mouth every 6 (six) hours as needed for moderate pain (pain score 4-6).     levothyroxine  (SYNTHROID , LEVOTHROID) 75 MCG tablet Take 1 tablet (75 mcg total) by mouth daily.     metFORMIN (GLUCOPHAGE-XR) 500 MG 24 hr tablet Take 500 mg by mouth daily.     omeprazole  (PRILOSEC) 20 MG capsule Take 1 capsule (20 mg total) by mouth daily.     oxybutynin  (DITROPAN ) 5 MG tablet Take 1 tablet (5 mg total) by mouth 3 (three) times daily. 90 tablet 0   paliperidone  (INVEGA ) 3 MG 24 hr tablet Take 1 tablet by mouth 2 (two) times daily.     polyethylene glycol (MIRALAX  / GLYCOLAX ) 17 g packet Take 17 g by mouth 2 (two) times daily. (Patient taking differently: Take 17 g by mouth 2 (two) times daily as needed for mild constipation.) 14 each 0   QUEtiapine  (SEROQUEL ) 200 MG tablet Take 600 mg by mouth at bedtime.     rosuvastatin  (CRESTOR ) 5 MG tablet Take 1 tablet (5 mg total) by mouth once a week. Take 5 mg by mouth every week on Friday (please hold medication until August 16, 2023 to minimize risk of rhabdomyolysis while using daptomycin ).  No facility-administered medications prior to visit.     No Known Allergies  Social History   Tobacco Use   Smoking status: Former    Current packs/day: 1.00    Types: Cigarettes  Vaping Use   Vaping status: Never Used  Substance Use Topics   Alcohol  use: Yes    Comment: occasionaly   Drug use: No    No family history on file.  Objective:   Vitals:   08/22/23 1416  BP: 92/62  Pulse: (!) 110  Temp: 97.7 F (36.5 C)  TempSrc: Temporal  SpO2: 100%   There is no height or weight on file to calculate BMI.  Physical Exam Constitutional:      General: He is not in acute distress.     Appearance: He is normal weight. He is not toxic-appearing.  HENT:     Head: Normocephalic and atraumatic.     Right Ear: External ear normal.     Left Ear: External ear normal.     Nose: No congestion or rhinorrhea.     Mouth/Throat:     Mouth: Mucous membranes are moist.     Pharynx: Oropharynx is clear.  Eyes:     Extraocular Movements: Extraocular movements intact.     Conjunctiva/sclera: Conjunctivae normal.     Pupils: Pupils are equal, round, and reactive to light.  Cardiovascular:     Rate and Rhythm: Normal rate and regular rhythm.     Heart sounds: No murmur heard.    No friction rub. No gallop.  Pulmonary:     Effort: Pulmonary effort is normal.     Breath sounds: Normal breath sounds.  Abdominal:     General: Abdomen is flat. Bowel sounds are normal.     Palpations: Abdomen is soft.  Musculoskeletal:        General: No swelling.     Cervical back: Normal range of motion and neck supple.  Skin:    General: Skin is warm and dry.  Neurological:     General: No focal deficit present.     Mental Status: He is oriented to person, place, and time.  Psychiatric:        Mood and Affect: Mood normal.     Lab Results: Lab Results  Component Value Date   WBC 8.7 07/13/2023   HGB 8.0 (L) 07/13/2023   HCT 25.5 (L) 07/13/2023   MCV 85.9 07/13/2023   PLT 394 07/13/2023    Lab Results  Component Value Date   CREATININE 1.20 07/14/2023   BUN 22 07/14/2023   NA 131 (L) 07/14/2023   K 4.2 07/14/2023   CL 101 07/14/2023   CO2 20 (L) 07/14/2023    Lab Results  Component Value Date   ALT 20 07/09/2023   AST 26 07/09/2023   ALKPHOS 105 07/09/2023   BILITOT 0.9 07/09/2023     Assessment & Plan:  #MRSA Bacteremia -5/17 blood CX 2/2 sets MRSA(dapto sens). 5/21 NG. TTE no veg. Completed dapto x 4 weeks eot 6/18 line out. 2/2 unclear source suspected to be wounds vs urinary -CT on 5/17 showed stable extensive bony destruction , right hip joint effusion.   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  -Dr. Orpha noted(I discussed case) pt needs tertiary care facility for  treatment as pt has chronic wounds.. She states pt could not be managed by ortho [facility] given complexity of wounds. I placed a referral to tertiary care at [facility]. I discussed that if labs are abnormal l will recommend going to ED, ideally [facility] as they are a teriary care facilty. I sent in the last two ID notes as well with patient(noted [facility] should access to our records). -6 weeks Abx ended 6/18. wbc 15k on 6/18 -Labs today, blood cx as well -Continue wound care -Linezolid x 2 weeks for possible superimposed infection. F/U with ID in 2 weeks in the setting of bacteremia    Loney Stank, MD [facility]   08/22/23  2:26 PM   I have personally spent 120 minutes involved in face-to-face and non-face-to-face activities for this patient on the day of the visit. Professional time spent includes the following activities: Preparing to see the patient (review of tests), Obtaining and/or reviewing separately obtained history (admission/discharge record), Performing a medically appropriate examination and/or evaluation , Ordering medications/tests/procedures, referring and communicating with other health care professionals, Documenting clinical information in the EMR, Independently interpreting results (not separately reported), Communicating results to the patient/family/caregiver, Counseling and educating the patient/family/caregiver and Care coordination (not separately reported).

## 2023-08-22 NOTE — Patient Instructions (Addendum)
-  labs today->will call if labs abnormal -Referred to tertiary care ortho -Linezolid x  2 weeks -F/U in 2 weeks

## 2023-08-23 ENCOUNTER — Ambulatory Visit (INDEPENDENT_AMBULATORY_CARE_PROVIDER_SITE_OTHER): Admitting: Gastroenterology

## 2023-08-23 ENCOUNTER — Encounter (INDEPENDENT_AMBULATORY_CARE_PROVIDER_SITE_OTHER): Payer: Self-pay | Admitting: Gastroenterology

## 2023-08-23 VITALS — BP 92/61 | HR 108 | Temp 97.5°F | Ht 70.0 in | Wt 200.0 lb

## 2023-08-23 DIAGNOSIS — Z800 Family history of malignant neoplasm of digestive organs: Secondary | ICD-10-CM | POA: Diagnosis not present

## 2023-08-23 DIAGNOSIS — E538 Deficiency of other specified B group vitamins: Secondary | ICD-10-CM | POA: Diagnosis not present

## 2023-08-23 DIAGNOSIS — E611 Iron deficiency: Secondary | ICD-10-CM

## 2023-08-23 DIAGNOSIS — K5900 Constipation, unspecified: Secondary | ICD-10-CM

## 2023-08-23 DIAGNOSIS — D649 Anemia, unspecified: Secondary | ICD-10-CM

## 2023-08-23 DIAGNOSIS — D500 Iron deficiency anemia secondary to blood loss (chronic): Secondary | ICD-10-CM | POA: Insufficient documentation

## 2023-08-23 NOTE — Patient Instructions (Signed)
 It was very nice to meet you today, as dicussed with will plan for the following :  1) Labs

## 2023-08-23 NOTE — Progress Notes (Signed)
 Johnedward Brodrick Faizan Shaylee Stanislawski , M.D. Gastroenterology & Hepatology [facility] [address] [place], [place] [address] Primary Care Physician: [name], MD [address] [place] [address]  Chief Complaint: Anemia  History of Present Illness: [name] is a 68 y.o. male with dementia, schizophrenia, hypertension, hypothyroidism, diabetes, chronic indwelling suprapubic catheter with bacteremia , septic arthritis who presents for evaluation of acute on chronic anemia  Patient appears to have normocytic anemia at least since 2023 with hemoglobin 12 and with recent blood work trending down to 7.5. Patient had 2 unit PRBC transfused 08/11/2023 at [facility]. Patient is accompanied with nephew in the clinic who is also the caretaker of the patient.  Patient is unsure about the color of his stools and neither has the nephew noticed any blood or colored stool.  Denies taking any NSAIDs The patient denies having any nausea, vomiting, fever, chills, hematemesis, abdominal distention, abdominal pain, diarrhea, jaundice, pruritus   Last ZHI:wnwz Last Colonoscopy:none  Labs from 08/01/2023 hemoglobin 7.5 MCV 86 creatinine 1.02 CRP 72  FHx: Father colon cancer-age 38 Social: neg smoking, alcohol  or illicit drug use Surgical: no abdominal surgeries  Past Medical History: Past Medical History:  Diagnosis Date   Dementia (HCC)    Schizophrenia (HCC)     Past Surgical History: Past Surgical History:  Procedure Laterality Date   IR CATHETER TUBE CHANGE  08/05/2021   SHOULDER SURGERY      Family History:History reviewed. No pertinent family history.  Social History: Social History   Tobacco Use  Smoking Status Former   Current packs/day: 1.00   Types: Cigarettes  Smokeless Tobacco Not on file   Social History   Substance and Sexual Activity  Alcohol  Use Yes   Comment: occasionaly   Social History   Substance and Sexual Activity  Drug Use No     Allergies: No Known Allergies  Medications: Current Outpatient Medications  Medication Sig Dispense Refill   benztropine  (COGENTIN ) 2 MG tablet Take 1 tablet (2 mg total) by mouth at bedtime. 30 tablet 0   cyanocobalamin  1000 MCG tablet Take 1 tablet (1,000 mcg total) by mouth daily. 30 tablet 2   donepezil (ARICEPT) 5 MG tablet Take 5 mg by mouth daily.     HYDROcodone -acetaminophen  (NORCO/VICODIN) 5-325 MG tablet Take 1 tablet by mouth every 6 (six) hours as needed for moderate pain (pain score 4-6).     levothyroxine  (SYNTHROID , LEVOTHROID) 75 MCG tablet Take 1 tablet (75 mcg total) by mouth daily.     linezolid (ZYVOX) 600 MG tablet Take 1 tablet (600 mg total) by mouth 2 (two) times daily for 14 days. 28 tablet 0   metFORMIN (GLUCOPHAGE-XR) 500 MG 24 hr tablet Take 500 mg by mouth daily.     omeprazole  (PRILOSEC) 20 MG capsule Take 1 capsule (20 mg total) by mouth daily.     oxybutynin  (DITROPAN ) 5 MG tablet Take 1 tablet (5 mg total) by mouth 3 (three) times daily. 90 tablet 0   paliperidone  (INVEGA ) 3 MG 24 hr tablet Take 1 tablet by mouth 2 (two) times daily.     polyethylene glycol (MIRALAX  / GLYCOLAX ) 17 g packet Take 17 g by mouth 2 (two) times daily. 14 each 0   QUEtiapine  (SEROQUEL ) 200 MG tablet Take 600 mg by mouth at bedtime.     rosuvastatin  (CRESTOR ) 5 MG tablet Take 1 tablet (5 mg total) by mouth once a week. Take 5 mg by mouth every week  on Friday (please hold medication until August 16, 2023 to minimize risk of rhabdomyolysis while using daptomycin ).     ascorbic acid  (VITAMIN C ) 500 MG tablet Take 1 tablet (500 mg total) by mouth daily. (Patient not taking: Reported on 08/23/2023) 30 tablet 2   No current facility-administered medications for this visit.    Review of Systems: GENERAL: negative for malaise, night sweats HEENT: No changes in hearing or vision, no nose bleeds or other nasal problems. NECK: Negative for lumps, goiter, pain and significant neck  swelling RESPIRATORY: Negative for cough, wheezing CARDIOVASCULAR: Negative for chest pain, leg swelling, palpitations, orthopnea GI: SEE HPI MUSCULOSKELETAL: Negative for joint pain or swelling, back pain, and muscle pain. SKIN: Negative for lesions, rash HEMATOLOGY Negative for prolonged bleeding, bruising easily, and swollen nodes. ENDOCRINE: Negative for cold or heat intolerance, polyuria, polydipsia and goiter. NEURO: negative for tremor, gait imbalance, syncope and seizures. The remainder of the review of systems is noncontributory.   Physical Exam: BP 92/61   Pulse (!) 108   Temp (!) 97.5 F (36.4 C)   Ht 5' 10 (1.778 m)   Wt 200 lb (90.7 kg) Comment: pt reported  BMI 28.70 kg/m  GENERAL: The patient is AO x3, in no acute distress. HEENT: Head is normocephalic and atraumatic. EOMI are intact. Mouth is well hydrated and without lesions. NECK: Supple. No masses LUNGS: Clear to auscultation. No presence of rhonchi/wheezing/rales. Adequate chest expansion HEART: RRR, normal s1 and s2. ABDOMEN: Soft, nontender, no guarding, no peritoneal signs, and nondistended. BS +. No masses.  Suprapubic catheter in place yellow urine  Imaging/Labs: as above     Latest Ref Rng & Units 08/22/2023    3:20 PM 07/13/2023    5:00 AM 07/12/2023    4:09 AM  CBC  WBC 3.8 - 10.8 Thousand/uL 11.3  8.7  9.6   Hemoglobin 13.2 - 17.1 g/dL 8.2  8.0  7.5   Hematocrit 38.5 - 50.0 % 27.3  25.5  23.5   Platelets 140 - 400 Thousand/uL 367  394  380    Lab Results  Component Value Date   IRON 15 (L) 07/09/2023   TIBC 163 (L) 07/09/2023   FERRITIN 676 (H) 07/09/2023    I personally reviewed and interpreted the available labs, imaging and endoscopic files.  Moderate splenomegaly.   Large amount of stool seen throughout the colon suggesting constipation.   Stable extensive bony destruction of right femoral head is noted with superior subluxation of the right femoral head relative to acetabulum,  with stable prominence of the medial musculature and soft tissues of the right hip which may represent joint effusion related to the hip. 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   Coronary artery calcifications are noted suggesting coronary artery disease.   Impression and Plan: [name] is a 68 y.o. male with dementia, schizophrenia, hypertension, hypothyroidism, diabetes, chronic indwelling suprapubic catheter with bacteremia , septic arthritis who presents for evaluation of acute on chronic anemia  # Normocytic anemia  Normocytic anemia at least since 2023 with hemoglobin 12 and with recent blood work trending down to 7.5. Patient had 2 unit PRBC transfused 08/11/2023 at [facility].  Vitamin B12 deficiency with level 145 Ferritin 676 Iron saturation 9% Folate 10.5  Recent labs does demonstrate iron deficiency with iron saturation of 9% and also vitamin B12 deficiency with vitamin B12 level of 145  Patient acute on chronic anemia likely multifactorial with iron deficiency, vitamin B12 deficiency  anemia and also with active infection component of anemia of chronic disease ( very high CRP)  Given family history of colon cancer and patient not having any prior colonoscopies there is indication for bidirectional endoscopy with upper endoscopy and colonoscopy for workup of severe anemia  After discussing risk-benefit limitation and indication with patient nephew(Paul) they would like to defer the procedure for next couple week as patient currently is dealing with septic arthritis/osteomyelitis which may need surgery at a tertiary center soon  -Will repeat CBC in a week  -will see the patient back in 2 weeks to reevaluate for upper endoscopy and colonoscopy -Continue vitamin B12 supplementation  # Constipation Recent CT with significant stool burden  Recommend MiraLAX  twice daily as patient is usually wheelchair-bound  All questions were answered.       Virlan Kempker Faizan Diontre Harps, MD Gastroenterology and Hepatology [facility]   This chart has been completed using [facility] Dictation software, and while attempts have been made to ensure accuracy , certain words and phrases may not be transcribed as intended

## 2023-08-24 ENCOUNTER — Inpatient Hospital Stay (HOSPITAL_COMMUNITY)

## 2023-08-24 ENCOUNTER — Telehealth: Payer: Self-pay | Admitting: Internal Medicine

## 2023-08-24 ENCOUNTER — Ambulatory Visit: Payer: Self-pay | Admitting: Internal Medicine

## 2023-08-24 ENCOUNTER — Other Ambulatory Visit: Payer: Self-pay

## 2023-08-24 ENCOUNTER — Encounter (HOSPITAL_COMMUNITY): Payer: Self-pay | Admitting: Internal Medicine

## 2023-08-24 ENCOUNTER — Other Ambulatory Visit (HOSPITAL_COMMUNITY)

## 2023-08-24 ENCOUNTER — Inpatient Hospital Stay (HOSPITAL_COMMUNITY)
Admission: EM | Admit: 2023-08-24 | Discharge: 2023-09-02 | DRG: 481 | Disposition: A | Discharge status: Home Health | Source: Ambulatory Visit | Attending: Internal Medicine | Admitting: Internal Medicine

## 2023-08-24 DIAGNOSIS — R531 Weakness: Secondary | ICD-10-CM | POA: Diagnosis not present

## 2023-08-24 DIAGNOSIS — E1165 Type 2 diabetes mellitus with hyperglycemia: Secondary | ICD-10-CM | POA: Diagnosis present

## 2023-08-24 DIAGNOSIS — Z79899 Other long term (current) drug therapy: Secondary | ICD-10-CM | POA: Diagnosis not present

## 2023-08-24 DIAGNOSIS — M86159 Other acute osteomyelitis, unspecified femur: Secondary | ICD-10-CM | POA: Diagnosis present

## 2023-08-24 DIAGNOSIS — M60009 Infective myositis, unspecified site: Secondary | ICD-10-CM | POA: Diagnosis not present

## 2023-08-24 DIAGNOSIS — E782 Mixed hyperlipidemia: Secondary | ICD-10-CM | POA: Diagnosis present

## 2023-08-24 DIAGNOSIS — K219 Gastro-esophageal reflux disease without esophagitis: Secondary | ICD-10-CM | POA: Diagnosis present

## 2023-08-24 DIAGNOSIS — F209 Schizophrenia, unspecified: Secondary | ICD-10-CM | POA: Diagnosis present

## 2023-08-24 DIAGNOSIS — M60051 Infective myositis, right thigh: Secondary | ICD-10-CM | POA: Diagnosis present

## 2023-08-24 DIAGNOSIS — Z87891 Personal history of nicotine dependence: Secondary | ICD-10-CM

## 2023-08-24 DIAGNOSIS — M8618 Other acute osteomyelitis, other site: Secondary | ICD-10-CM | POA: Diagnosis present

## 2023-08-24 DIAGNOSIS — M7989 Other specified soft tissue disorders: Secondary | ICD-10-CM | POA: Diagnosis not present

## 2023-08-24 DIAGNOSIS — Z515 Encounter for palliative care: Secondary | ICD-10-CM | POA: Diagnosis not present

## 2023-08-24 DIAGNOSIS — E039 Hypothyroidism, unspecified: Secondary | ICD-10-CM | POA: Diagnosis present

## 2023-08-24 DIAGNOSIS — L02415 Cutaneous abscess of right lower limb: Secondary | ICD-10-CM | POA: Diagnosis present

## 2023-08-24 DIAGNOSIS — D649 Anemia, unspecified: Secondary | ICD-10-CM | POA: Diagnosis present

## 2023-08-24 DIAGNOSIS — M009 Pyogenic arthritis, unspecified: Secondary | ICD-10-CM | POA: Diagnosis not present

## 2023-08-24 DIAGNOSIS — R7881 Bacteremia: Secondary | ICD-10-CM

## 2023-08-24 DIAGNOSIS — Z79890 Hormone replacement therapy (postmenopausal): Secondary | ICD-10-CM | POA: Diagnosis not present

## 2023-08-24 DIAGNOSIS — E871 Hypo-osmolality and hyponatremia: Secondary | ICD-10-CM | POA: Diagnosis present

## 2023-08-24 DIAGNOSIS — E119 Type 2 diabetes mellitus without complications: Secondary | ICD-10-CM | POA: Diagnosis not present

## 2023-08-24 DIAGNOSIS — M00851 Arthritis due to other bacteria, right hip: Secondary | ICD-10-CM | POA: Diagnosis not present

## 2023-08-24 DIAGNOSIS — I10 Essential (primary) hypertension: Secondary | ICD-10-CM | POA: Diagnosis present

## 2023-08-24 DIAGNOSIS — M86151 Other acute osteomyelitis, right femur: Secondary | ICD-10-CM | POA: Diagnosis not present

## 2023-08-24 DIAGNOSIS — F0390 Unspecified dementia without behavioral disturbance: Secondary | ICD-10-CM | POA: Diagnosis present

## 2023-08-24 DIAGNOSIS — Z556 Problems related to health literacy: Secondary | ICD-10-CM

## 2023-08-24 DIAGNOSIS — Z95828 Presence of other vascular implants and grafts: Secondary | ICD-10-CM | POA: Diagnosis not present

## 2023-08-24 DIAGNOSIS — B9562 Methicillin resistant Staphylococcus aureus infection as the cause of diseases classified elsewhere: Secondary | ICD-10-CM | POA: Diagnosis present

## 2023-08-24 DIAGNOSIS — R000 Tachycardia, unspecified: Secondary | ICD-10-CM | POA: Diagnosis not present

## 2023-08-24 DIAGNOSIS — M00051 Staphylococcal arthritis, right hip: Secondary | ICD-10-CM | POA: Diagnosis present

## 2023-08-24 DIAGNOSIS — E1169 Type 2 diabetes mellitus with other specified complication: Secondary | ICD-10-CM | POA: Diagnosis present

## 2023-08-24 DIAGNOSIS — M179 Osteoarthritis of knee, unspecified: Secondary | ICD-10-CM | POA: Diagnosis not present

## 2023-08-24 DIAGNOSIS — Z743 Need for continuous supervision: Secondary | ICD-10-CM | POA: Diagnosis not present

## 2023-08-24 DIAGNOSIS — M25551 Pain in right hip: Secondary | ICD-10-CM | POA: Diagnosis not present

## 2023-08-24 DIAGNOSIS — Z7984 Long term (current) use of oral hypoglycemic drugs: Secondary | ICD-10-CM

## 2023-08-24 DIAGNOSIS — R6889 Other general symptoms and signs: Secondary | ICD-10-CM | POA: Diagnosis not present

## 2023-08-24 DIAGNOSIS — N179 Acute kidney failure, unspecified: Secondary | ICD-10-CM | POA: Diagnosis present

## 2023-08-24 DIAGNOSIS — M609 Myositis, unspecified: Secondary | ICD-10-CM | POA: Diagnosis present

## 2023-08-24 DIAGNOSIS — Z7189 Other specified counseling: Secondary | ICD-10-CM | POA: Diagnosis not present

## 2023-08-24 LAB — CULTURE, BLOOD (SINGLE)

## 2023-08-24 LAB — BASIC METABOLIC PANEL WITH GFR
Anion gap: 10 (ref 5–15)
BUN: 32 mg/dL — ABNORMAL HIGH (ref 8–23)
CO2: 21 mmol/L — ABNORMAL LOW (ref 22–32)
Calcium: 8.9 mg/dL (ref 8.9–10.3)
Chloride: 98 mmol/L (ref 98–111)
Creatinine, Ser: 1.77 mg/dL — ABNORMAL HIGH (ref 0.61–1.24)
GFR, Estimated: 42 mL/min — ABNORMAL LOW (ref >60)
Glucose, Bld: 238 mg/dL — ABNORMAL HIGH (ref 70–99)
Potassium: 4.2 mmol/L (ref 3.5–5.1)
Sodium: 129 mmol/L — ABNORMAL LOW (ref 135–145)

## 2023-08-24 LAB — URINALYSIS, W/ REFLEX TO CULTURE (INFECTION SUSPECTED)
Bilirubin Urine: NEGATIVE
Glucose, UA: NEGATIVE mg/dL
Ketones, ur: NEGATIVE mg/dL
Nitrite: NEGATIVE
Protein, ur: NEGATIVE mg/dL
Specific Gravity, Urine: 1.01 (ref 1.005–1.030)
WBC, UA: >50 WBC/hpf (ref 0–5)
pH: 6 (ref 5.0–8.0)

## 2023-08-24 LAB — CBC WITH DIFFERENTIAL/PLATELET
Abs Immature Granulocytes: 0.05 10*3/uL (ref 0.00–0.07)
Basophils Absolute: 0 10*3/uL (ref 0.0–0.1)
Basophils Relative: 0 %
Eosinophils Absolute: 0.3 10*3/uL (ref 0.0–0.5)
Eosinophils Relative: 3 %
HCT: 25.4 % — ABNORMAL LOW (ref 39.0–52.0)
Hemoglobin: 7.7 g/dL — ABNORMAL LOW (ref 13.0–17.0)
Immature Granulocytes: 1 %
Lymphocytes Relative: 21 %
Lymphs Abs: 2.1 10*3/uL (ref 0.7–4.0)
MCH: 26.5 pg (ref 26.0–34.0)
MCHC: 30.3 g/dL (ref 30.0–36.0)
MCV: 87.3 fL (ref 80.0–100.0)
Monocytes Absolute: 0.6 10*3/uL (ref 0.1–1.0)
Monocytes Relative: 6 %
Neutro Abs: 7.1 10*3/uL (ref 1.7–7.7)
Neutrophils Relative %: 69 %
Platelets: 352 10*3/uL (ref 150–400)
RBC: 2.91 MIL/uL — ABNORMAL LOW (ref 4.22–5.81)
RDW: 16.9 % — ABNORMAL HIGH (ref 11.5–15.5)
WBC: 10.1 10*3/uL (ref 4.0–10.5)
nRBC: 0 % (ref 0.0–0.2)

## 2023-08-24 LAB — HEPATIC FUNCTION PANEL
ALT: 23 U/L (ref 0–44)
AST: 31 U/L (ref 15–41)
Albumin: 1.8 g/dL — ABNORMAL LOW (ref 3.5–5.0)
Alkaline Phosphatase: 107 U/L (ref 38–126)
Bilirubin, Direct: 0.3 mg/dL — ABNORMAL HIGH (ref 0.0–0.2)
Indirect Bilirubin: 0.5 mg/dL (ref 0.3–0.9)
Total Bilirubin: 0.8 mg/dL (ref 0.0–1.2)
Total Protein: 8.1 g/dL (ref 6.5–8.1)

## 2023-08-24 LAB — OSMOLALITY, URINE: Osmolality, Ur: 320 mosm/kg (ref 300–900)

## 2023-08-24 LAB — SODIUM, URINE, RANDOM: Sodium, Ur: 63 mmol/L

## 2023-08-24 LAB — GLUCOSE, CAPILLARY
Glucose-Capillary: 173 mg/dL — ABNORMAL HIGH (ref 70–99)
Glucose-Capillary: 185 mg/dL — ABNORMAL HIGH (ref 70–99)

## 2023-08-24 LAB — ECHOCARDIOGRAM LIMITED
Height: 70 in
S' Lateral: 3.1 cm
Weight: 2934.76 [oz_av]

## 2023-08-24 LAB — OSMOLALITY: Osmolality: 299 mosm/kg — ABNORMAL HIGH (ref 275–295)

## 2023-08-24 LAB — MAGNESIUM: Magnesium: 1.9 mg/dL (ref 1.7–2.4)

## 2023-08-24 LAB — MRSA NEXT GEN BY PCR, NASAL: MRSA by PCR Next Gen: DETECTED — AB

## 2023-08-24 LAB — PHOSPHORUS: Phosphorus: 3.5 mg/dL (ref 2.5–4.6)

## 2023-08-24 MED ORDER — ONDANSETRON HCL 4 MG PO TABS
4.0000 mg | ORAL_TABLET | Freq: Four times a day (QID) | ORAL | Status: DC | PRN
Start: 2023-08-24 — End: 2023-09-02

## 2023-08-24 MED ORDER — POVIDONE-IODINE 10 % EX SWAB
2.0000 | Freq: Once | CUTANEOUS | Status: AC
  Administered 2023-08-25: 2 via TOPICAL

## 2023-08-24 MED ORDER — ROSUVASTATIN CALCIUM 5 MG PO TABS
5.0000 mg | ORAL_TABLET | ORAL | Status: DC
  Administered 2023-08-26 – 2023-09-02 (×2): 5 mg via ORAL
  Filled 2023-08-24 (×4): qty 1

## 2023-08-24 MED ORDER — MUPIROCIN 2 % EX OINT
TOPICAL_OINTMENT | Freq: Two times a day (BID) | CUTANEOUS | Status: DC
  Filled 2023-08-24 (×5): qty 22

## 2023-08-24 MED ORDER — ONDANSETRON HCL 4 MG/2ML IJ SOLN: 4.0000 mg | Freq: Four times a day (QID) | INTRAMUSCULAR | Status: DC | PRN

## 2023-08-24 MED ORDER — CHLORHEXIDINE GLUCONATE 4 % EX SOLN
60.0000 mL | Freq: Once | CUTANEOUS | Status: DC
  Filled 2023-08-24: qty 60

## 2023-08-24 MED ORDER — ENOXAPARIN SODIUM 40 MG/0.4ML IJ SOSY
40.0000 mg | PREFILLED_SYRINGE | INTRAMUSCULAR | Status: DC
  Administered 2023-08-24 – 2023-09-02 (×10): 40 mg via SUBCUTANEOUS
  Filled 2023-08-24 (×10): qty 0.4

## 2023-08-24 MED ORDER — VANCOMYCIN HCL 2000 MG/400ML IV SOLN
2000.0000 mg | Freq: Once | INTRAVENOUS | Status: AC
  Administered 2023-08-24: 2000 mg via INTRAVENOUS
  Filled 2023-08-24: qty 400

## 2023-08-24 MED ORDER — VANCOMYCIN HCL IN DEXTROSE 1-5 GM/200ML-% IV SOLN
1000.0000 mg | INTRAVENOUS | Status: AC
  Administered 2023-08-25: 1000 mg via INTRAVENOUS
  Filled 2023-08-24: qty 200

## 2023-08-24 MED ORDER — LEVOTHYROXINE SODIUM 75 MCG PO TABS
75.0000 ug | ORAL_TABLET | Freq: Every day | ORAL | Status: DC
  Administered 2023-08-24 – 2023-09-02 (×9): 75 ug via ORAL
  Filled 2023-08-24 (×9): qty 1

## 2023-08-24 MED ORDER — VANCOMYCIN HCL 1250 MG/250ML IV SOLN: 1250.0000 mg | INTRAVENOUS | Status: DC

## 2023-08-24 MED ORDER — CHLORHEXIDINE GLUCONATE CLOTH 2 % EX PADS
6.0000 | MEDICATED_PAD | Freq: Every day | CUTANEOUS | Status: DC
  Administered 2023-08-25 – 2023-09-02 (×8): 6 via TOPICAL

## 2023-08-24 MED ORDER — CEFAZOLIN SODIUM-DEXTROSE 2-4 GM/100ML-% IV SOLN
2.0000 g | INTRAVENOUS | Status: AC
  Administered 2023-08-25: 2 g via INTRAVENOUS
  Filled 2023-08-24: qty 100

## 2023-08-24 MED ORDER — DONEPEZIL HCL 5 MG PO TABS
5.0000 mg | ORAL_TABLET | Freq: Every day | ORAL | Status: DC
  Administered 2023-08-24 – 2023-09-02 (×10): 5 mg via ORAL
  Filled 2023-08-24 (×10): qty 1

## 2023-08-24 MED ORDER — INSULIN ASPART 100 UNIT/ML IJ SOLN
0.0000 [IU] | Freq: Three times a day (TID) | INTRAMUSCULAR | Status: DC
  Administered 2023-08-24: 2 [IU] via SUBCUTANEOUS
  Administered 2023-08-24: 3 [IU] via SUBCUTANEOUS
  Administered 2023-08-25 – 2023-08-26 (×4): 2 [IU] via SUBCUTANEOUS
  Administered 2023-08-26: 3 [IU] via SUBCUTANEOUS
  Administered 2023-08-27: 2 [IU] via SUBCUTANEOUS
  Administered 2023-08-27: 3 [IU] via SUBCUTANEOUS
  Administered 2023-08-27 – 2023-08-29 (×5): 2 [IU] via SUBCUTANEOUS
  Administered 2023-08-29 – 2023-08-31 (×7): 1 [IU] via SUBCUTANEOUS
  Administered 2023-09-01: 2 [IU] via SUBCUTANEOUS
  Administered 2023-09-01 – 2023-09-02 (×2): 1 [IU] via SUBCUTANEOUS

## 2023-08-24 MED ORDER — ACETAMINOPHEN 325 MG PO TABS
650.0000 mg | ORAL_TABLET | Freq: Four times a day (QID) | ORAL | Status: DC | PRN
  Administered 2023-08-25 – 2023-08-28 (×2): 650 mg via ORAL
  Filled 2023-08-24 (×2): qty 2

## 2023-08-24 MED ORDER — PANTOPRAZOLE SODIUM 40 MG PO TBEC
40.0000 mg | DELAYED_RELEASE_TABLET | Freq: Every day | ORAL | Status: DC
  Administered 2023-08-24 – 2023-09-02 (×10): 40 mg via ORAL
  Filled 2023-08-24 (×10): qty 1

## 2023-08-24 MED ORDER — ACETAMINOPHEN 650 MG RE SUPP: 650.0000 mg | Freq: Four times a day (QID) | RECTAL | Status: DC | PRN

## 2023-08-24 MED ORDER — DAPTOMYCIN-SODIUM CHLORIDE 700-0.9 MG/100ML-% IV SOLN
8.0000 mg/kg | Freq: Every day | INTRAVENOUS | Status: DC
  Administered 2023-08-24 – 2023-09-02 (×10): 700 mg via INTRAVENOUS
  Filled 2023-08-24 (×10): qty 100

## 2023-08-24 MED ORDER — SODIUM CHLORIDE 0.9 % IV SOLN: INTRAVENOUS | Status: AC

## 2023-08-24 NOTE — Consult Note (Signed)
 Reason for Consult:Right hip septic arthritis Referring Physician: Ripudeep Rai Time called: 1345 Time at bedside: 1402   [name] is an 68 y.o. male.  HPI: [name] was admitted 2/2 bacteremia. He underwent MRI of the pelvis which showed a destructive process and complex fluid surrounding the right hip. Pt denies pain but is apparently moderately demented though he can carry on a conversation. He lives with his nephew and has struggled to walk for a couple of years though was still ambulatory with a RW until just a few weeks ago.  Past Medical History:  Diagnosis Date   Dementia (HCC)    Schizophrenia [facility])     Past Surgical History:  Procedure Laterality Date   IR CATHETER TUBE CHANGE  08/05/2021   SHOULDER SURGERY      History reviewed. No pertinent family history.  Social History:  reports that he has quit smoking. His smoking use included cigarettes. He does not have any smokeless tobacco history on file. He reports current alcohol  use. He reports that he does not use drugs.  Allergies: No Known Allergies  Medications: I have reviewed the patient's current medications.  Results for orders placed or performed during the hospital encounter of 08/24/23 (from the past 48 hours)  CBC with Differential     Status: Abnormal   Collection Time: 08/24/23  2:46 AM  Result Value Ref Range   WBC 10.1 4.0 - 10.5 K/uL   RBC 2.91 (L) 4.22 - 5.81 MIL/uL   Hemoglobin 7.7 (L) 13.0 - 17.0 g/dL   HCT 74.5 (L) 60.9 - 47.9 %   MCV 87.3 80.0 - 100.0 fL   MCH 26.5 26.0 - 34.0 pg   MCHC 30.3 30.0 - 36.0 g/dL   RDW 83.0 (H) 88.4 - 84.4 %   Platelets 352 150 - 400 K/uL   nRBC 0.0 0.0 - 0.2 %   Neutrophils Relative % 69 %   Neutro Abs 7.1 1.7 - 7.7 K/uL   Lymphocytes Relative 21 %   Lymphs Abs 2.1 0.7 - 4.0 K/uL   Monocytes Relative 6 %   Monocytes Absolute 0.6 0.1 - 1.0 K/uL   Eosinophils Relative 3 %   Eosinophils Absolute 0.3 0.0 - 0.5 K/uL   Basophils Relative 0 %   Basophils  Absolute 0.0 0.0 - 0.1 K/uL   Immature Granulocytes 1 %   Abs Immature Granulocytes 0.05 0.00 - 0.07 K/uL    Comment: Performed at [facility], [address], [place], [place] [address]  Basic metabolic panel     Status: Abnormal   Collection Time: 08/24/23  2:46 AM  Result Value Ref Range   Sodium 129 (L) 135 - 145 mmol/L   Potassium 4.2 3.5 - 5.1 mmol/L   Chloride 98 98 - 111 mmol/L   CO2 21 (L) 22 - 32 mmol/L   Glucose, Bld 238 (H) 70 - 99 mg/dL    Comment: Glucose reference range applies only to samples taken after fasting for at least 8 hours.   BUN 32 (H) 8 - 23 mg/dL   Creatinine, Ser 8.22 (H) 0.61 - 1.24 mg/dL   Calcium  8.9 8.9 - 10.3 mg/dL   GFR, Estimated 42 (L) >60 mL/min    Comment: (NOTE) Calculated using the CKD-EPI Creatinine Equation (2021)    Anion gap 10 5 - 15    Comment: Performed at [facility], [address], [place], [place] [address]  Blood culture (routine x 2)     Status: None (Preliminary  result)   Collection Time: 08/24/23  2:46 AM   Specimen: BLOOD LEFT WRIST  Result Value Ref Range   Specimen Description BLOOD LEFT WRIST    Special Requests      BOTTLES DRAWN AEROBIC AND ANAEROBIC Blood Culture adequate volume   Culture      NO GROWTH < 12 HOURS Performed at [facility], [address], [place], [place] [address]    Report Status PENDING   Blood culture (routine x 2)     Status: None (Preliminary result)   Collection Time: 08/24/23  2:46 AM   Specimen: BLOOD RIGHT FOREARM  Result Value Ref Range   Specimen Description BLOOD RIGHT FOREARM    Special Requests      BOTTLES DRAWN AEROBIC AND ANAEROBIC Blood Culture adequate volume   Culture      NO GROWTH < 12 HOURS Performed at [facility], [address], [place], [place] [address]    Report Status PENDING   Osmolality     Status: Abnormal   Collection Time: 08/24/23  2:46 AM  Result Value Ref Range   Osmolality 299 (H) 275 - 295 mOsm/kg    Comment: REPEATED TO VERIFY Performed at [facility], [address] [place], [place] [address]   Magnesium      Status: None   Collection Time: 08/24/23  2:46 AM  Result Value Ref Range   Magnesium  1.9 1.7 - 2.4 mg/dL    Comment: Performed at [facility], [address] [place], [place] [address]  Phosphorus     Status: None   Collection Time: 08/24/23  2:46 AM  Result Value Ref Range   Phosphorus 3.5 2.5 - 4.6 mg/dL    Comment: Performed at [facility], [address], [place], [place] [address]  Hepatic function panel     Status: Abnormal   Collection Time: 08/24/23  2:46 AM  Result Value Ref Range   Total Protein 8.1 6.5 - 8.1 g/dL   Albumin  1.8 (L) 3.5 - 5.0 g/dL   AST 31 15 - 41 U/L   ALT 23 0 - 44 U/L   Alkaline Phosphatase 107 38 - 126 U/L   Total Bilirubin 0.8 0.0 - 1.2 mg/dL   Bilirubin, Direct 0.3 (H) 0.0 - 0.2 mg/dL   Indirect Bilirubin 0.5 0.3 - 0.9 mg/dL    Comment: Performed at [facility], [address] [place], [place] [address]  Glucose, capillary     Status: Abnormal   Collection Time: 08/24/23 12:56 PM  Result Value Ref Range   Glucose-Capillary 173 (H) 70 - 99 mg/dL    Comment: Glucose reference range applies only to samples taken after fasting for at least 8 hours.    DG HIP UNILAT WITH PELVIS 2-3 VIEWS RIGHT Result Date: 08/24/2023 CLINICAL DATA:  Right hip swelling and pain. EXAM: DG HIP (WITH OR WITHOUT PELVIS) 2-3V RIGHT COMPARISON:  MR right hip 08/18/2023, CT abdomen pelvis 06/23/2023. FINDINGS: Complete resorption of the right femoral head and neck with superior dislocation of the right femoral shaft. Erosive changes in the right acetabulum. Findings are similar to 06/23/2023. Mild subchondral sclerosis and osteophytosis in the left hip. IMPRESSION: Complete resorption/destruction of the right femoral head and neck with involvement of the right acetabulum and superior displacement of the right femoral shaft. Findings are worrisome for osteomyelitis. Please correlate with recent MR right hip  performed 08/18/2023. Electronically Signed   By: Newell Eke M.D.   On: 08/24/2023 11:37  Review of Systems  HENT:  Negative for ear discharge, ear pain, hearing loss and tinnitus.   Eyes:  Negative for photophobia and pain.  Respiratory:  Negative for cough and shortness of breath.   Cardiovascular:  Negative for chest pain.  Gastrointestinal:  Negative for abdominal pain, nausea and vomiting.  Genitourinary:  Negative for dysuria, flank pain, frequency and urgency.  Musculoskeletal:  Negative for back pain, myalgias and neck pain.  Neurological:  Negative for dizziness and headaches.  Hematological:  Does not bruise/bleed easily.  Psychiatric/Behavioral:  The patient is not nervous/anxious.    Blood pressure (!) 106/49, pulse 89, temperature 97.8 F (36.6 C), temperature source Axillary, resp. rate 18, height 5' 10 (1.778 m), weight 83.2 kg, SpO2 100%. Physical Exam Constitutional:      General: He is not in acute distress.    Appearance: He is well-developed. He is not diaphoretic.  HENT:     Head: Normocephalic and atraumatic.  Eyes:     General: No scleral icterus.       Right eye: No discharge.        Left eye: No discharge.     Conjunctiva/sclera: Conjunctivae normal.  Cardiovascular:     Rate and Rhythm: Normal rate and regular rhythm.  Pulmonary:     Effort: Pulmonary effort is normal. No respiratory distress.  Musculoskeletal:     Cervical back: Normal range of motion.     Comments: RLE No traumatic wounds, ecchymosis, or rash  Nontender, no hip pain with flex/rotation. Some lower leg and knee pain with AROM hip  No knee or ankle effusion  Knee stable to varus/ valgus and anterior/posterior stress  Sens DPN, SPN, TN intact  Motor EHL, ext, flex, evers 5/5  DP 2+, PT 2+, No significant edema  Skin:    General: Skin is warm and dry.  Neurological:     Mental Status: He is alert.  Psychiatric:        Mood and Affect: Mood normal.        Behavior: Behavior  normal.     Assessment/Plan: Right hip pathology -- He's had some destructive process going on there for quite some time as there was e/o in 2023. Doubt it's been infection this whole time. Will plan on I&D tomorrow with Dr. Kendal; please keep NPO after MN. Unfortunately no hip replacement options for pt as there's not enough acetabulum left.    Clifford DOROTHA Ned, PA-C Orthopedic Surgery [phone] 08/24/2023, 2:09 PM

## 2023-08-24 NOTE — Progress Notes (Addendum)
 [facility]                                                                              [name], is a 68 y.o. male, DOB - Mar 02, 1955, FMW:[id number] Admit date - 08/24/2023    Outpatient Primary MD for the patient is [name], Dhruv B, MD  LOS - 0  days  No chief complaint on file.      Brief summary   Patient is a 68 year old male with HTN, hypothyroidism, diabetes mellitus, presented to ED with abnormal labs. Patient was admitted from 5/17 to 5/23 due to MRSA bacteremia that was secondary to chronic indwelling suprapubic catheter which was treated with IV vancomycin . TTE done on 07/12/2023 showed no vegetations and patient refused TEE. He was discharged and treated with IV daptomycin  daily for about 4 weeks. PICC line was placed prior to discharge at that time.  Per EDP, patient's nephew received a phone call from patient's physician to go to [facility] immediately due to bacteremia, gram-positive cocci in blood.  In ED, vital signs stable, temp 97.4 F, pulse 102, normocytic anemia, sodium 129, creatinine 1.77.  Blood cultures positive for gram-positive cocci.  He was placed on IV vancomycin .   Assessment & Plan     GPC bacteremia -Recent admission 5/17 to 5/23 with MRSA bacteremia, secondary to chronic indwelling suprapubic catheter, was treated with IV vancomycin . -2D echo 5/20 had shown EF> 75%, no vegetations, patient had refused TEE -Repeat blood cultures -ID consulted, discussed with Dr. Juli thinking bacteremia likely due to the right hip.  Patient had a recent MRI right hip on 6/26 outpatient which showed severe destruction to the right proximal femur and right acetabulum, septic arthritis/osteomyelitis should be excluded.  Moderate to large joint effusion, areas of phlegmon and developing abscesses, ?  Bursitis -Will request orthopedic consult Addendum 11:12 AM Spoke with Dr. Kendal, orthopedics, reviewed patient's prior imagings, recommended  transfer to [facility], n.p.o. after midnight for joint aspiration and further management.   Chronic hyponatremia -Na 129, baseline sodium 128-131 Continue gentle hydration -Follow serum osmolality, urine osmolality, UNA   Acute kidney injury -Creatinine 1.77 (baseline creatinine at 1.1-1.2) -Continue gentle hydration    Type 2 diabetes mellitus with hyperglycemia -Hemoglobin A1c on 06/23/2023 was 7.0 -Continue sliding scale insulin  while inpatient    Acquired hypothyroidism Continue Synthroid    GERD Continue Protonix    Dementia Continue Aricept   Mixed hyperlipidemia Continue Crestor   Estimated body mass index is 25.88 kg/m as calculated from the following:   Height as of this encounter: 5' 10 (1.778 m).   Weight as of this encounter: 81.8 kg.  Code Status: Full code DVT Prophylaxis:  enoxaparin  (LOVENOX ) injection 40 mg Start: 08/24/23 1000 SCDs Start: 08/24/23 9378   Level of Care: Level of care: Med-Surg Family Communication: Updated patient's nephew [name] on the phone, agreed with the transfer. Disposition Plan:      Remains inpatient appropriate: per ID and orthopedics recommendations.  Transfer to [facility].   Procedures:    Consultants:   Infectious disease, Dr. Juli Orthopedics  Antimicrobials:   Anti-infectives (From admission, onward)  Start     Dose/Rate Route Frequency Ordered Stop   08/25/23 0600  vancomycin  (VANCOREADY) IVPB 1250 mg/250 mL        1,250 mg 166.7 mL/hr over 90 Minutes Intravenous Every 24 hours 08/24/23 0625     08/24/23 0300  vancomycin  (VANCOREADY) IVPB 2000 mg/400 mL        2,000 mg 200 mL/hr over 120 Minutes Intravenous  Once 08/24/23 0258 08/24/23 0518          Medications  donepezil  5 mg Oral Daily   enoxaparin  (LOVENOX ) injection  40 mg Subcutaneous Q24H   insulin  aspart  0-9 Units Subcutaneous TID WC   levothyroxine   75 mcg Oral Daily   pantoprazole   40 mg Oral Daily   [START ON  08/26/2023] rosuvastatin   5 mg Oral Weekly      Subjective:   [name] was seen and examined today.  Denies any specific complaints, tired weak and lethargic.  Patient denies dizziness, chest pain, shortness of breath, abdominal pain, N/V.  No fevers Objective:   Vitals:   08/24/23 0345 08/24/23 0400 08/24/23 0433 08/24/23 0741  BP: (!) 118/58 119/63 (!) 95/57 97/61  Pulse: 94 91 95 95  Resp: 14 13 20 15   Temp:   98.8 F (37.1 C) 97.8 F (36.6 C)  TempSrc:   Oral Axillary  SpO2: 98% 99% 99% 98%  Weight:   81.8 kg   Height:   5' 10 (1.778 m)     Intake/Output Summary (Last 24 hours) at 08/24/2023 1014 Last data filed at 08/24/2023 0742 Gross per 24 hour  Intake 400.57 ml  Output 525 ml  Net -124.43 ml     Wt Readings from Last 3 Encounters:  08/24/23 81.8 kg  08/23/23 90.7 kg  07/17/23 87 kg     Exam General: Alert and oriented, NAD, ill-appearing, deconditioned Cardiovascular: S1 S2 auscultated,  RRR Respiratory: Clear to auscultation bilaterally, no wheezing Gastrointestinal: Soft, nontender, nondistended, + bowel sounds Ext: no pedal edema bilaterally Neuro: Strength 5/5 upper and lower extremities bilaterally Psych: sleepy, tired but easily arousable    Data Reviewed:  I have personally reviewed following labs    CBC Lab Results  Component Value Date   WBC 10.1 08/24/2023   RBC 2.91 (L) 08/24/2023   HGB 7.7 (L) 08/24/2023   HCT 25.4 (L) 08/24/2023   MCV 87.3 08/24/2023   MCH 26.5 08/24/2023   PLT 352 08/24/2023   MCHC 30.3 08/24/2023   RDW 16.9 (H) 08/24/2023   LYMPHSABS 2.1 08/24/2023   MONOABS 0.6 08/24/2023   EOSABS 0.3 08/24/2023   BASOSABS 0.0 08/24/2023     Last metabolic panel Lab Results  Component Value Date   NA 129 (L) 08/24/2023   K 4.2 08/24/2023   CL 98 08/24/2023   CO2 21 (L) 08/24/2023   BUN 32 (H) 08/24/2023   CREATININE 1.77 (H) 08/24/2023   GLUCOSE 238 (H) 08/24/2023   GFRNONAA 42 (L) 08/24/2023   GFRAA >90  09/20/2012   CALCIUM  8.9 08/24/2023   PHOS 3.5 08/24/2023   PROT 8.1 08/24/2023   ALBUMIN  1.8 (L) 08/24/2023   BILITOT 0.8 08/24/2023   ALKPHOS 107 08/24/2023   AST 31 08/24/2023   ALT 23 08/24/2023   ANIONGAP 10 08/24/2023    CBG (last 3)  No results for input(s): GLUCAP in the last 72 hours.    Coagulation Profile: No results for input(s): INR, PROTIME in the last 168 hours.   Radiology Studies: I have  personally reviewed the imaging studies  No results found.     Nydia Distance M.D. [facility] 08/24/2023, 10:14 AM  Available via Epic secure chat 7am-7pm After 7 pm, please refer to night coverage provider listed on amion.

## 2023-08-24 NOTE — ED Triage Notes (Signed)
 Pt bib [facility] from home. Pt was seen at an outpatient facility and had lab work, was called tonight with positive blood culture result and instructed to come to ED for further evaluation.

## 2023-08-24 NOTE — Progress Notes (Signed)
 Pharmacy Antibiotic Note  [name] is a 68 y.o. male admitted on 08/24/2023 with MRSA bacteremia.  Pharmacy has been consulted for Daptomycin  dosing.  Scr 1.77 (baseline ~1.2).   Plan: Daptomycin  700 mg q24h  Weekly CK starting 7/3   Height: 5' 10 (177.8 cm) Weight: 83.2 kg (183 lb 6.8 oz) IBW/kg (Calculated) : 73  Temp (24hrs), Avg:98 F (36.7 C), Min:97.4 F (36.3 C), Max:98.8 F (37.1 C)  Recent Labs  Lab 08/22/23 1520 08/24/23 0246  WBC 11.3* 10.1  CREATININE 1.77* 1.77*    Estimated Creatinine Clearance: 41.8 mL/min (A) (by C-G formula based on SCr of 1.77 mg/dL (H)).    No Known Allergies  Thank you for allowing pharmacy to be a part of this patient's care.  Rankin Sams 08/24/2023 4:35 PM

## 2023-08-24 NOTE — Progress Notes (Signed)
 ED Pharmacy Antibiotic Sign Off An antibiotic consult was received from an ED provider for Vancomycin  per pharmacy dosing for bacteremia. A chart review was completed to assess appropriateness.   The following one time order(s) were placed:  Vancomycin  2000 mg IV x 1  Further antibiotic and/or antibiotic pharmacy consults should be ordered by the admitting provider if indicated.   Lynwood Mckusick, PharmD, BCPS Clinical Pharmacist Phone: [phone]

## 2023-08-24 NOTE — ED Provider Notes (Signed)
 [place] EMERGENCY DEPARTMENT AT [facility] Provider Note   CSN: [id number] Arrival date & time: 08/24/23  9776     Patient presents with: Abnormal labs  Level 5 caveat due to dementia [name] is a 68 y.o. male.   The history is provided by the patient and the EMS personnel.  Patient with history of dementia, schizophrenia presents for abnormal labs.  Patient arrives via EMS.  EMS reports that they were called out due to abnormal labs.  They report the nephew of the patient told them that they received a phone call from a physician who said to go to [facility] immediately. They were unable to provide any other history  Per EMS, patient has been mildly tachycardic but otherwise in no acute distress     Past Medical History:  Diagnosis Date   Dementia (HCC)    Schizophrenia (HCC)     Prior to Admission medications   Medication Sig Start Date End Date Taking? Authorizing Provider  ascorbic acid  (VITAMIN C ) 500 MG tablet Take 1 tablet (500 mg total) by mouth daily. Patient not taking: Reported on 08/23/2023 07/15/23   Ricky Fines, MD  benztropine  (COGENTIN ) 2 MG tablet Take 1 tablet (2 mg total) by mouth at bedtime. 09/28/12   [name] LABOR, NP  cyanocobalamin  1000 MCG tablet Take 1 tablet (1,000 mcg total) by mouth daily. 07/15/23   Ricky Fines, MD  donepezil (ARICEPT) 5 MG tablet Take 5 mg by mouth daily. 04/17/21   [provider]  HYDROcodone -acetaminophen  (NORCO/VICODIN) 5-325 MG tablet Take 1 tablet by mouth every 6 (six) hours as needed for moderate pain (pain score 4-6). 07/08/23   [provider]  levothyroxine  (SYNTHROID , LEVOTHROID) 75 MCG tablet Take 1 tablet (75 mcg total) by mouth daily. 09/28/12   [name] LABOR, NP  linezolid (ZYVOX) 600 MG tablet Take 1 tablet (600 mg total) by mouth 2 (two) times daily for 14 days. 08/22/23 09/05/23  [name], MD  metFORMIN (GLUCOPHAGE-XR) 500 MG 24 hr tablet Take 500 mg by mouth  daily. 03/13/21   [provider]  omeprazole  (PRILOSEC) 20 MG capsule Take 1 capsule (20 mg total) by mouth daily. 09/28/12   [name] LABOR, NP  oxybutynin  (DITROPAN ) 5 MG tablet Take 1 tablet (5 mg total) by mouth 3 (three) times daily. 07/15/23   Ricky Fines, MD  paliperidone  (INVEGA ) 3 MG 24 hr tablet Take 1 tablet by mouth 2 (two) times daily. 03/13/21   [provider]  polyethylene glycol (MIRALAX  / GLYCOLAX ) 17 g packet Take 17 g by mouth 2 (two) times daily. 03/20/21   [name] LABOR, MD  QUEtiapine  (SEROQUEL ) 200 MG tablet Take 600 mg by mouth at bedtime. 03/13/21   [provider]  rosuvastatin  (CRESTOR ) 5 MG tablet Take 1 tablet (5 mg total) by mouth once a week. Take 5 mg by mouth every week on Friday (please hold medication until August 16, 2023 to minimize risk of rhabdomyolysis while using daptomycin ). 08/13/23   Ricky Fines, MD    Allergies: Patient has no known allergies.    Review of Systems  Unable to perform ROS: Dementia    Updated Vital Signs BP 109/60   Pulse 97   Temp (!) 97.4 F (36.3 C) (Oral)   Resp 15   SpO2 98%   Physical Exam CONSTITUTIONAL: Elderly, no acute distress HEAD: Normocephalic/atraumatic EYES: EOMI/PERRL ENMT: Mucous membranes moist, poor dentition NECK: supple no meningeal signs SPINE/BACK:entire spine  nontender CV: S1/S2 noted, no murmurs/rubs/gallops noted, tachycardic LUNGS: Lungs are clear to auscultation bilaterally, no apparent distress ABDOMEN: soft, nontender Suprapubic catheter in place, mild erythema noted, no discharge, no tenderness External genitalia otherwise unremarkable in appearance, nurse present for exam NEURO: Pt is awake/alert/appropriate, moves all extremitiesx4.  EXTREMITIES: pulses normal/equal, full ROM Significant tenderness elicited with range of motion of the right hip.  There is no overlying skin changes to the right hip. All other extremities/joints palpated/ranged and  nontender SKIN: warm, color normal PSYCH: Flat affect   (all labs ordered are listed, but only abnormal results are displayed) Labs Reviewed  CBC WITH DIFFERENTIAL/PLATELET - Abnormal; Notable for the following components:      Result Value   RBC 2.91 (*)    Hemoglobin 7.7 (*)    HCT 25.4 (*)    RDW 16.9 (*)    All other components within normal limits  BASIC METABOLIC PANEL WITH GFR - Abnormal; Notable for the following components:   Sodium 129 (*)    CO2 21 (*)    Glucose, Bld 238 (*)    BUN 32 (*)    Creatinine, Ser 1.77 (*)    GFR, Estimated 42 (*)    All other components within normal limits  CULTURE, BLOOD (ROUTINE X 2)  CULTURE, BLOOD (ROUTINE X 2)    EKG: EKG Interpretation Date/Time:  Wednesday August 24 2023 02:32:21 EDT Ventricular Rate:  106 PR Interval:  137 QRS Duration:  97 QT Interval:  346 QTC Calculation: 460 R Axis:   80  Text Interpretation: Sinus tachycardia No significant change since last tracing Confirmed by Midge Golas ([id number]) on 08/24/2023 2:38:35 AM  Radiology: No results found.   Procedures   Medications Ordered in the ED  vancomycin  (VANCOREADY) IVPB 2000 mg/400 mL (2,000 mg Intravenous New Bag/Given 08/24/23 0305)    Clinical Course as of 08/24/23 0350  Wed Aug 24, 2023  0240 Patient chronically ill at baseline with a history of dementia and schizophrenia and has a legal guardian.  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  Blood cultures from June 30 are positive for gram-positive cocci.  I spoke with on-call infectious disease specialist Dr. Tia recommends repeat blood cultures, start vancomycin  and admit [DW]  0349 Discussed with Dr. Adefeso for admission Patient resting comfortably no acute distress [DW]    Clinical Course User Index [DW] Midge Golas, MD                                 Medical Decision Making Amount and/or Complexity of Data Reviewed Labs:  ordered.  Risk Prescription drug management. Decision regarding hospitalization.   This patient presents to the ED for concern of abnormal labs, this involves an extensive number of treatment options, and is a complaint that carries with it a high risk of complications and morbidity.  The differential diagnosis includes but is not limited to bacteremia, sepsis, osteomyelitis, septic arthritis, endocarditis  Comorbidities that complicate the patient evaluation: Patient's presentation is complicated by their history of schizophrenia  Social Determinants of Health: Patient's poor health literacy, dementia  increases the complexity of managing their presentation  Additional history obtained: Additional history obtained from EMS  Records reviewed infectious disease notes reviewed  Lab Tests: I Ordered, and personally interpreted labs.  The pertinent results include: Acute on chronic anemia, renal insufficiency, hyperglycemia, mild hyponatremia  Cardiac Monitoring: The  patient was maintained on a cardiac monitor.  I personally viewed and interpreted the cardiac monitor which showed an underlying rhythm of:  sinus tachycardia  Medicines ordered and prescription drug management: I ordered medication including vancomycin  for bacteremia Reevaluation of the patient after these medicines showed that the patient    stayed the same  Critical Interventions:   IV antibiotics, admission  Consultations Obtained: I requested consultation with the admitting physician [facility] and consultant infectious disease Dr. Luiz, and discussed  findings as well as pertinent plan - they recommend: Admitted, will need to reconsult infectious disease in the morning  Reevaluation: After the interventions noted above, I reevaluated the patient and found that they have :stayed the same  Complexity of problems addressed: Patient's presentation is most consistent with  acute presentation with potential threat to  life or bodily function  Disposition: After consideration of the diagnostic results and the patient's response to treatment,  I feel that the patent would benefit from admission  .    Patient overall stable at this time, vitals are appropriate, he is not septic appearing.  Will be admitted for ongoing management of bacteremia    Final diagnoses:  Bacteremia    ED Discharge Orders     None          Midge Golas, MD 08/24/23 [phone]

## 2023-08-24 NOTE — Plan of Care (Signed)
   Problem: Activity: Goal: Risk for activity intolerance will decrease Outcome: Progressing   Problem: Coping: Goal: Level of anxiety will decrease Outcome: Progressing

## 2023-08-24 NOTE — Telephone Encounter (Signed)
 I spoke to Mr Hartwig Sister, who is his legal guardian to inform her that Tafari's blood work shows that he has a blood stream infection and needs to be evaluated promptly to be started on abtx and find source of infection  Mr Adelstein did have recent hx of mrsa bacteremia and concern that this maybe a recurrence.  I have communicated with dr midge, ED provider at [facility] to anticipate his arrival.  ID service will provide further recommendations in the morning.  Montie FURY Luiz MD MPH [facility] [phone]

## 2023-08-24 NOTE — H&P (Signed)
 History and Physical    Patient: [name] FMW:[id number] DOB: June 03, 1955 DOA: 08/24/2023 DOS: the patient was seen and examined on 08/24/2023 PCP: [name], MD  Patient coming from: Home  Chief Complaint: No chief complaint on file.  HPI: [name] is a 68 y.o. male with medical history significant of hypertension, hypothyroidism, type 2 diabetes mellitus who presents to the emergency department due to abnormal labs via EMS.  Patient was unable to provide history, history was obtained from EDP and ED medical record.  Per report, apparently, patient's nephew received a phone call from patient's physician to go to [facility] immediately due to bacteremia (gram-positive cocci in blood). He was reported to be mildly tachycardic by EMS team, but no other history could be obtained from patient.  Patient was admitted from 5/17 to 5/23 due to MRSA bacteremia that was secondary to chronic indwelling suprapubic catheter which was treated with IV vancomycin .  TTE done on 07/12/2023 showed no vegetations and patient refused TEE.  He was discharged and treated with IV daptomycin  daily for about 4 weeks.  PICC line was placed prior to discharge at that time.  ED Course:  In the emergency department, temperature was 97.8F, pulse 102 bpm, other vital signs were within normal range.  Workup in the ED showed normocytic anemia.  BMP was normal except for sodium of 129, bicarb 21, blood glucose 238, BUN/creatinine 32/1.77 (baseline creatinine at 1.1-1.2) with a GFR of 42.  Blood culture was positive for gram-positive cocci.  New blood culture obtained still pending. He was treated with IV vancomycin   Infectious disease (Dr. Dennise, [facility]) appeared to be Dificid as sent into the ED.   Review of Systems: Review of systems as noted in the HPI. All other systems reviewed and are negative.   Past Medical History:  Diagnosis Date   Dementia (HCC)    Schizophrenia [facility])    Past Surgical  History:  Procedure Laterality Date   IR CATHETER TUBE CHANGE  08/05/2021   SHOULDER SURGERY      Social History:  reports that he has quit smoking. His smoking use included cigarettes. He does not have any smokeless tobacco history on file. He reports current alcohol  use. He reports that he does not use drugs.   No Known Allergies  History reviewed. No pertinent family history.   Prior to Admission medications   Medication Sig Start Date End Date Taking? Authorizing Provider  ascorbic acid  (VITAMIN C ) 500 MG tablet Take 1 tablet (500 mg total) by mouth daily. Patient not taking: Reported on 08/23/2023 07/15/23   Ricky Fines, MD  benztropine  (COGENTIN ) 2 MG tablet Take 1 tablet (2 mg total) by mouth at bedtime. 09/28/12   [name] LABOR, NP  cyanocobalamin  1000 MCG tablet Take 1 tablet (1,000 mcg total) by mouth daily. 07/15/23   Ricky Fines, MD  donepezil (ARICEPT) 5 MG tablet Take 5 mg by mouth daily. 04/17/21   [provider]  HYDROcodone -acetaminophen  (NORCO/VICODIN) 5-325 MG tablet Take 1 tablet by mouth every 6 (six) hours as needed for moderate pain (pain score 4-6). 07/08/23   [provider]  levothyroxine  (SYNTHROID , LEVOTHROID) 75 MCG tablet Take 1 tablet (75 mcg total) by mouth daily. 09/28/12   [name] LABOR, NP  linezolid (ZYVOX) 600 MG tablet Take 1 tablet (600 mg total) by mouth 2 (two) times daily for 14 days. 08/22/23 09/05/23  [name], MD  metFORMIN (GLUCOPHAGE-XR) 500 MG 24 hr tablet Take 500  mg by mouth daily. 03/13/21   [provider]  omeprazole  (PRILOSEC) 20 MG capsule Take 1 capsule (20 mg total) by mouth daily. 09/28/12   [name] LABOR, NP  oxybutynin  (DITROPAN ) 5 MG tablet Take 1 tablet (5 mg total) by mouth 3 (three) times daily. 07/15/23   Ricky Fines, MD  paliperidone  (INVEGA ) 3 MG 24 hr tablet Take 1 tablet by mouth 2 (two) times daily. 03/13/21   [provider]  polyethylene glycol (MIRALAX  / GLYCOLAX ) 17 g packet Take  17 g by mouth 2 (two) times daily. 03/20/21   [name] LABOR, MD  QUEtiapine  (SEROQUEL ) 200 MG tablet Take 600 mg by mouth at bedtime. 03/13/21   [provider]  rosuvastatin  (CRESTOR ) 5 MG tablet Take 1 tablet (5 mg total) by mouth once a week. Take 5 mg by mouth every week on Friday (please hold medication until August 16, 2023 to minimize risk of rhabdomyolysis while using daptomycin ). 08/13/23   Ricky Fines, MD    Physical Exam: BP (!) 95/57 (BP Location: Left Arm)   Pulse 95   Temp 98.8 F (37.1 C) (Oral)   Resp 20   Ht 5' 10 (1.778 m)   Wt 81.8 kg   SpO2 99%   BMI 25.88 kg/m   General: 68 y.o. year-old male well developed well nourished in no acute distress.   HEENT: NCAT, EOMI Neck: Supple, trachea medial Cardiovascular: Regular rate and rhythm with no rubs or gallops.  No thyromegaly or JVD noted.  No lower extremity edema. 2/4 pulses in all 4 extremities. Respiratory: Clear to auscultation with no wheezes or rales. Good inspiratory effort. Abdomen: Soft, nontender, suprapubic catheter noted with mild erythema.  Nondistended with normal bowel sounds x4 quadrants. Muskuloskeletal: No cyanosis, clubbing or edema noted bilaterally Neuro: Moves all extremities.  Sensation, reflexes intact Skin: No ulcerative lesions noted or rashes Psychiatry: Mood is appropriate for condition and setting          Labs on Admission:  Basic Metabolic Panel: Recent Labs  Lab 08/22/23 1520 08/24/23 0246  NA 130* 129*  K 4.6 4.2  CL 96* 98  CO2 22 21*  GLUCOSE 250* 238*  BUN 27* 32*  CREATININE 1.77* 1.77*  CALCIUM  8.8 8.9   Liver Function Tests: Recent Labs  Lab 08/22/23 1520  AST 16  ALT 15  BILITOT 0.7  PROT 8.2*   No results for input(s): LIPASE, AMYLASE in the last 168 hours. No results for input(s): AMMONIA in the last 168 hours. CBC: Recent Labs  Lab 08/22/23 1520 08/24/23 0246  WBC 11.3* 10.1  NEUTROABS 7,854* 7.1  HGB 8.2* 7.7*  HCT 27.3* 25.4*   MCV 87.2 87.3  PLT 367 352   Cardiac Enzymes: No results for input(s): CKTOTAL, CKMB, CKMBINDEX, TROPONINI in the last 168 hours.  BNP (last 3 results) Recent Labs    07/09/23 1314  BNP 40.0    ProBNP (last 3 results) No results for input(s): PROBNP in the last 8760 hours.  CBG: No results for input(s): GLUCAP in the last 168 hours.  Radiological Exams on Admission: No results found.  EKG: I independently viewed the EKG done and my findings are as followed: Sinus tachycardia at a rate of 106 bpm  Assessment/Plan Present on Admission:  Bacteremia  Hyponatremia  AKI (acute kidney injury) (HCC)  Dementia without behavioral disturbance (HCC)  Principal Problem:   Bacteremia Active Problems:   Hyponatremia   AKI (acute kidney injury) (HCC)   Dementia  without behavioral disturbance (HCC)   Type 2 diabetes mellitus with hyperglycemia (HCC)   Acquired hypothyroidism   GERD (gastroesophageal reflux disease)   Mixed hyperlipidemia  Bacteremia Noted with IV vancomycin , we shall continue same at this time Echocardiogram will be done in the morning to rule out vegetation Prior blood culture was positive for gram-positive cocci Repeated blood culture pending. Consider consulting with infectious disease specialist  Hyponatremia Na 129; Continue gentle hydration Continue to monitor sodium with serial BMPs Urine osmolality, serum osmolality and urine sodium will be checked  Acute kidney injury BUN/creatinine 32/1.77 (baseline creatinine at 1.1-1.2) Continue gentle hydration Renally adjust medications, avoid nephrotoxic agents/dehydration/hypotension  Type 2 diabetes mellitus with hyperglycemia Hemoglobin A1c on 06/23/2023 was 7.0 Continue ISS and hypoglycemia protocol  Acquired hypothyroidism Continue Synthroid   GERD Continue Protonix   Dementia Continue Aricept  Mixed hyperlipidemia Continue Crestor   DVT prophylaxis: Lovenox    Code Status: Full  code  Family Communication: None at bedside  Consults: None  Severity of Illness: The appropriate patient status for this patient is INPATIENT. Inpatient status is judged to be reasonable and necessary in order to provide the required intensity of service to ensure the patient's safety. The patient's presenting symptoms, physical exam findings, and initial radiographic and laboratory data in the context of their chronic comorbidities is felt to place them at high risk for further clinical deterioration. Furthermore, it is not anticipated that the patient will be medically stable for discharge from the hospital within 2 midnights of admission.   * I certify that at the point of admission it is my clinical judgment that the patient will require inpatient hospital care spanning beyond 2 midnights from the point of admission due to high intensity of service, high risk for further deterioration and high frequency of surveillance required.*  Author: Somalia Segler, DO 08/24/2023 6:27 AM  For on call review [email or web address].

## 2023-08-24 NOTE — Consult Note (Signed)
 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, acetabular pathology --seen in May of 2025 and which may have been source of his bacteremia at that time Dementia Schizophrenia   Plan:  Will use daptomycin  and hope that we can ask for MRSA S to dapto Repeat blood cultures after starting antibiotics Orthopedics have seen the patient and will take to the OR tomorrow (I do think he will need aggressive surgery to pelvic bones at some point if it is not addressed during this admission ESR, CRP TTE Would also recommend palliative care consult given dementia and extent of his pathology Contact precautions TDM CPK baseline and follow   Principal Problem:   MRSA bacteremia Active Problems:   AKI (acute kidney injury) (HCC)   Hyponatremia   Dementia without behavioral disturbance (HCC)   Type 2 diabetes mellitus with hyperglycemia (HCC)   Acquired hypothyroidism   GERD (gastroesophageal reflux disease)   Mixed hyperlipidemia   Acute osteomyelitis involving pelvic region and thigh (HCC)   Septic arthritis of hip (HCC)   Myositis   Scheduled Meds:  donepezil  5 mg Oral Daily   enoxaparin  (LOVENOX ) injection  40 mg Subcutaneous Q24H   insulin  aspart  0-9 Units Subcutaneous TID WC   levothyroxine   75 mcg Oral Daily   pantoprazole   40 mg Oral Daily   [START ON 08/26/2023] rosuvastatin   5 mg Oral Weekly   Continuous Infusions:  [START ON 08/25/2023] vancomycin      PRN Meds:.acetaminophen  **OR** acetaminophen , ondansetron  **OR** ondansetron  (ZOFRAN ) IV  HPI: [name] is a 68 y.o. male please mellitus  dementia schizophrenia history of prior suprapubic catheter who was seen by my partner Dr. Luiz in May of this year when he was found to have MRSA bacteremia of unknown clear etiology.  Cleared his bacteremia and 2D echocardiogram failed to show any evidence of endocarditis he did have multiple wounds that could have been the source of his bacteremia he declined TEE he was treated with a 4-week course of IV daptomycin .  OF note I can see that he had a CT abdomen and pelvis on May 17th, 2025 that ALREADY showed extensive bony destruction of the femoral head with superior subluxation of the right femoral head relative to acetabulum, though radiology did not suggest infection, rather chronic arthropathy.  He was having continued pain and a provider ordered MRI of the hip which showed:  Severe destruction to the right proximal femur and areas of the right acetabulum. Septic arthritis/osteomyelitis should be excluded. Correlate with laboratory values. Destructive changes can also be seen with paralysis.   Moderate to large joint effusion which is complex. There are also areas of complex ill-defined fluid posterior to the joint space suspicious for phlegmon/developing abscesses as well as greater trochanteric bursal fluid which could reflect infected bursitis. This could also be aspirated if clinically warranted.   Moderate to severe myositis to the gluteus musculature on the right  He was then seen by my partner Dr. Dennise at [facility] who took blood cultures which have now come back with  [facility]  There has been mention of getting him to a tertiary care center to address the pathology involving his femur and pelvis.  Given his bacteremia though he has been admitted to [facility] orthopedic surgery have seen the patient and are planning on I&D of the hip I do not think they are going to address the destructive bone findings.  Suspicion retrospectively is that the right hip was already infected when he  was bacteremic in May.  His dementia undoubtedly compromised her ability to elicit findings on physical exam as he was difficulty even today to interview and examine.  It is true that when this destructive finding was seen in May that radiology did not comment on an effusion and the scan was read as showing stable destructive changes vs scan in January of 2023 that did not describe any musculoskeletal pathology which is curious.  In case he needs surgical attention to his septic hip during this hospitalization, I would favor if possible at least biopsy of the femur if there is doubt of this representing osteomyelitis.  I do think he will clearly need surgical attention to his right pelvis if he is going to continue to have aggressive care we will go ahead and treat him with IV daptomycin  in the interim  I have personally spent 111 minutes involved in face-to-face and non-face-to-face activities for this patient on the day of the visit. Professional time spent includes the following activities: Preparing to see the patient (review of tests), Obtaining and/or reviewing separately obtained history (admission/discharge record), Performing a medically appropriate examination and/or evaluation , Ordering medications/tests/procedures, referring and communicating with other health care professionals, Documenting clinical information in the EMR, Independently interpreting results (not separately reported), Communicating results to the patient/family/caregiver, Counseling and educating the patient/family/caregiver and Care coordination (not separately reported).    Evaluation of the patient requires complex antimicrobial therapy evaluation, counseling , isolation needs to reduce disease transmission and risk assessment and mitigation.     Review of Systems: Review of Systems  Unable to perform ROS: Dementia    Past Medical History:  Diagnosis Date   Dementia (HCC)    Schizophrenia (HCC)     Social  History   Tobacco Use   Smoking status: Former    Current packs/day: 1.00    Types: Cigarettes  Vaping Use   Vaping status: Never Used  Substance Use Topics   Alcohol  use: Yes    Comment: occasionaly   Drug use: No    History reviewed. No pertinent family history. No Known Allergies  OBJECTIVE: Blood pressure 135/65, pulse 88, temperature 98 F (36.7 C), temperature source Oral, resp. rate 18, height 5' 10 (1.778 m), weight 83.2 kg, SpO2 100%.  Physical Exam Constitutional:      Appearance: He is well-developed.  HENT:     Head: Normocephalic and atraumatic.  Eyes:     Conjunctiva/sclera: Conjunctivae normal.  Cardiovascular:     Rate and Rhythm: Normal rate and regular rhythm.  Pulmonary:     Effort: Pulmonary effort is normal. No respiratory distress.     Breath sounds: No wheezing.  Abdominal:     General: There is no distension.     Palpations: Abdomen is soft.  Musculoskeletal:     Cervical back: Normal range of motion and neck supple.     Right hip: Decreased range of motion.  Skin:    General: Skin is warm and dry.     Coloration: Skin is not pale.  Findings: No erythema or rash.  Neurological:     General: No focal deficit present.     Mental Status: He is alert.  Psychiatric:        Attention and Perception: He is inattentive.        Mood and Affect: Mood normal.        Speech: Speech is delayed.        Behavior: Behavior is slowed. Behavior is cooperative.        Thought Content: Thought content normal.        Cognition and Memory: Cognition is impaired. Memory is impaired. He exhibits impaired recent memory and impaired remote memory.        Judgment: Judgment normal.     Lab Results Lab Results  Component Value Date   WBC 10.1 08/24/2023   HGB 7.7 (L) 08/24/2023   HCT 25.4 (L) 08/24/2023   MCV 87.3 08/24/2023   PLT 352 08/24/2023    Lab Results  Component Value Date   CREATININE 1.77 (H) 08/24/2023   BUN 32 (H) 08/24/2023   NA 129  (L) 08/24/2023   K 4.2 08/24/2023   CL 98 08/24/2023   CO2 21 (L) 08/24/2023    Lab Results  Component Value Date   ALT 23 08/24/2023   AST 31 08/24/2023   ALKPHOS 107 08/24/2023   BILITOT 0.8 08/24/2023     Microbiology: Recent Results (from the past 240 hours)  Culture, blood (single) w Reflex to ID Panel     Status: Abnormal (Preliminary result)   Collection Time: 08/22/23  3:20 PM   Specimen: Blood  Result Value Ref Range Status   MICRO NUMBER: [id number]  Preliminary   SPECIMEN QUALITY: Adequate  Preliminary   Source BLOOD 1  Preliminary   STATUS: PRELIMINARY  Preliminary   Result:   Preliminary    Pos 24.8 No growth to date. Culture is continuously monitored for a total of 120 hours incubation. A change in status will result in a phone report followed by an updated printed culture report.   ISOLATE 1: Gram positive cocci isolated (AA)  Preliminary    Comment: Gram positive cocci isolated , from anaerobic bottle only   COMMENT: Aerobic and anaerobic bottle received.  Preliminary  Culture, blood (single) w Reflex to ID Panel     Status: Abnormal (Preliminary result)   Collection Time: 08/22/23  3:22 PM   Specimen: Blood  Result Value Ref Range Status   MICRO NUMBER: [id number]  Preliminary   SPECIMEN QUALITY: Adequate  Preliminary   Source BLOOD 2  Preliminary   STATUS: PRELIMINARY  Preliminary   Result:   Preliminary    Pos 21.3 Pos 25.2 No growth to date. Culture is continuously monitored for a total of 120 hours incubation. A change in status will result in a phone report followed by an updated printed culture report.   ISOLATE 1: Gram positive cocci isolated (AA)  Preliminary    Comment: Gram positive cocci isolated , from aerobic and anaerobic bottles   COMMENT: Aerobic and anaerobic bottle received.  Preliminary  Blood culture (routine x 2)     Status: None (Preliminary result)   Collection Time: 08/24/23  2:46 AM   Specimen: BLOOD LEFT WRIST  Result Value Ref Range  Status   Specimen Description BLOOD LEFT WRIST  Final   Special Requests   Final    BOTTLES DRAWN AEROBIC AND ANAEROBIC Blood Culture adequate volume   Culture   Final  NO GROWTH < 12 HOURS Performed at [facility], [address], [place], [place] [address]    Report Status PENDING  Incomplete  Blood culture (routine x 2)     Status: None (Preliminary result)   Collection Time: 08/24/23  2:46 AM   Specimen: BLOOD RIGHT FOREARM  Result Value Ref Range Status   Specimen Description BLOOD RIGHT FOREARM  Final   Special Requests   Final    BOTTLES DRAWN AEROBIC AND ANAEROBIC Blood Culture adequate volume   Culture   Final    NO GROWTH < 12 HOURS Performed at [facility], [address] [place], [place] [address]    Report Status PENDING  Incomplete  MRSA Next Gen by PCR, Nasal     Status: Abnormal   Collection Time: 08/24/23 11:45 AM   Specimen: Nasal Mucosa; Nasal Swab  Result Value Ref Range Status   MRSA by PCR Next Gen DETECTED (A) NOT DETECTED Final    Comment: RESULT CALLED TO, READ BACK BY AND VERIFIED WITH: W EARLY,RN@1546  08/24/23 MK (NOTE) The GeneXpert MRSA Assay (FDA approved for NASAL specimens only), is one component of a comprehensive MRSA colonization surveillance program. It is not intended to diagnose MRSA infection nor to guide or monitor treatment for MRSA infections. Test performance is not FDA approved in patients less than 56 years old. Performed at [facility], [address], [place], [place] [address]     Jomarie Fleeta Rothman, MD [facility] [phone] pager  08/24/2023, 4:02 PM

## 2023-08-24 NOTE — Progress Notes (Signed)
 Please send to ED for bacteremia. GPC in blood

## 2023-08-24 NOTE — Progress Notes (Signed)
 Report called to [employer] RN at [facility] 57M.

## 2023-08-24 NOTE — Telephone Encounter (Signed)
 Noted: pt already at [facility] ED

## 2023-08-24 NOTE — Progress Notes (Signed)
 Mobility Specialist Progress Note:    08/24/23 0951  Mobility  Activity Ambulated with assistance in room;Transferred from bed to chair  Level of Assistance Minimal assist, patient does 75% or more  Assistive Device Front wheel walker  Distance Ambulated (ft) 2 ft  Range of Motion/Exercises Active;All extremities  Activity Response Tolerated well  Mobility Referral Yes  Mobility visit 1 Mobility  Mobility Specialist Start Time (ACUTE ONLY) 0915  Mobility Specialist Stop Time (ACUTE ONLY) 0930  Mobility Specialist Time Calculation (min) (ACUTE ONLY) 15 min   Pt received in bed, agreeable to mobility. Required MinA to stand and transfer with RW. Tolerated well, asx throughout. Left in chair, alarm on. Call bell in reach, all needs met.   Sherrilee Ditty Mobility Specialist Please contact via Special educational needs teacher or  Rehab office at [phone]

## 2023-08-24 NOTE — Progress Notes (Addendum)
 Pharmacy Antibiotic Note  [name] is a 68 y.o. male admitted on 08/24/2023 with bacteremia.  Pharmacy has been consulted for Vancomycin  dosing.  Plan: Vancomycin  1250 mg IV q24h  Height: 5' 10 (177.8 cm) Weight: 81.8 kg (180 lb 5.4 oz) IBW/kg (Calculated) : 73  Temp (24hrs), Avg:97.9 F (36.6 C), Min:97.4 F (36.3 C), Max:98.8 F (37.1 C)  Recent Labs  Lab 08/22/23 1520 08/24/23 0246  WBC 11.3* 10.1  CREATININE 1.77* 1.77*    Estimated Creatinine Clearance: 41.8 mL/min (A) (by C-G formula based on SCr of 1.77 mg/dL (H)).    No Known Allergies   Dail Cordella Misty 08/24/2023 6:20 AM

## 2023-08-24 NOTE — Plan of Care (Signed)

## 2023-08-24 NOTE — H&P (View-Only) (Signed)
 Reason for Consult:Right hip septic arthritis Referring Physician: Ripudeep Rai Time called: 1345 Time at bedside: 1402   [name] is an 68 y.o. male.  HPI: [name] was admitted 2/2 bacteremia. He underwent MRI of the pelvis which showed a destructive process and complex fluid surrounding the right hip. Pt denies pain but is apparently moderately demented though he can carry on a conversation. He lives with his nephew and has struggled to walk for a couple of years though was still ambulatory with a RW until just a few weeks ago.  Past Medical History:  Diagnosis Date   Dementia (HCC)    Schizophrenia [facility])     Past Surgical History:  Procedure Laterality Date   IR CATHETER TUBE CHANGE  08/05/2021   SHOULDER SURGERY      History reviewed. No pertinent family history.  Social History:  reports that he has quit smoking. His smoking use included cigarettes. He does not have any smokeless tobacco history on file. He reports current alcohol  use. He reports that he does not use drugs.  Allergies: No Known Allergies  Medications: I have reviewed the patient's current medications.  Results for orders placed or performed during the hospital encounter of 08/24/23 (from the past 48 hours)  CBC with Differential     Status: Abnormal   Collection Time: 08/24/23  2:46 AM  Result Value Ref Range   WBC 10.1 4.0 - 10.5 K/uL   RBC 2.91 (L) 4.22 - 5.81 MIL/uL   Hemoglobin 7.7 (L) 13.0 - 17.0 g/dL   HCT 74.5 (L) 60.9 - 47.9 %   MCV 87.3 80.0 - 100.0 fL   MCH 26.5 26.0 - 34.0 pg   MCHC 30.3 30.0 - 36.0 g/dL   RDW 83.0 (H) 88.4 - 84.4 %   Platelets 352 150 - 400 K/uL   nRBC 0.0 0.0 - 0.2 %   Neutrophils Relative % 69 %   Neutro Abs 7.1 1.7 - 7.7 K/uL   Lymphocytes Relative 21 %   Lymphs Abs 2.1 0.7 - 4.0 K/uL   Monocytes Relative 6 %   Monocytes Absolute 0.6 0.1 - 1.0 K/uL   Eosinophils Relative 3 %   Eosinophils Absolute 0.3 0.0 - 0.5 K/uL   Basophils Relative 0 %   Basophils  Absolute 0.0 0.0 - 0.1 K/uL   Immature Granulocytes 1 %   Abs Immature Granulocytes 0.05 0.00 - 0.07 K/uL    Comment: Performed at [facility], [address], [place], [place] [address]  Basic metabolic panel     Status: Abnormal   Collection Time: 08/24/23  2:46 AM  Result Value Ref Range   Sodium 129 (L) 135 - 145 mmol/L   Potassium 4.2 3.5 - 5.1 mmol/L   Chloride 98 98 - 111 mmol/L   CO2 21 (L) 22 - 32 mmol/L   Glucose, Bld 238 (H) 70 - 99 mg/dL    Comment: Glucose reference range applies only to samples taken after fasting for at least 8 hours.   BUN 32 (H) 8 - 23 mg/dL   Creatinine, Ser 8.22 (H) 0.61 - 1.24 mg/dL   Calcium  8.9 8.9 - 10.3 mg/dL   GFR, Estimated 42 (L) >60 mL/min    Comment: (NOTE) Calculated using the CKD-EPI Creatinine Equation (2021)    Anion gap 10 5 - 15    Comment: Performed at [facility], [address] [place], [place] [address]  Blood culture (routine x 2)     Status: None (Preliminary  result)   Collection Time: 08/24/23  2:46 AM   Specimen: BLOOD LEFT WRIST  Result Value Ref Range   Specimen Description BLOOD LEFT WRIST    Special Requests      BOTTLES DRAWN AEROBIC AND ANAEROBIC Blood Culture adequate volume   Culture      NO GROWTH < 12 HOURS Performed at [facility], [address], [place], [place] [address]    Report Status PENDING   Blood culture (routine x 2)     Status: None (Preliminary result)   Collection Time: 08/24/23  2:46 AM   Specimen: BLOOD RIGHT FOREARM  Result Value Ref Range   Specimen Description BLOOD RIGHT FOREARM    Special Requests      BOTTLES DRAWN AEROBIC AND ANAEROBIC Blood Culture adequate volume   Culture      NO GROWTH < 12 HOURS Performed at [facility], [address], [place], [place] [address]    Report Status PENDING   Osmolality     Status: Abnormal   Collection Time: 08/24/23  2:46 AM  Result Value Ref Range   Osmolality 299 (H) 275 - 295 mOsm/kg    Comment: REPEATED TO VERIFY Performed at [facility], [address], [place], [place] [address]   Magnesium      Status: None   Collection Time: 08/24/23  2:46 AM  Result Value Ref Range   Magnesium  1.9 1.7 - 2.4 mg/dL    Comment: Performed at [facility], [address], [place], [place] [address]  Phosphorus     Status: None   Collection Time: 08/24/23  2:46 AM  Result Value Ref Range   Phosphorus 3.5 2.5 - 4.6 mg/dL    Comment: Performed at [facility], [address] [place], [place] [address]  Hepatic function panel     Status: Abnormal   Collection Time: 08/24/23  2:46 AM  Result Value Ref Range   Total Protein 8.1 6.5 - 8.1 g/dL   Albumin  1.8 (L) 3.5 - 5.0 g/dL   AST 31 15 - 41 U/L   ALT 23 0 - 44 U/L   Alkaline Phosphatase 107 38 - 126 U/L   Total Bilirubin 0.8 0.0 - 1.2 mg/dL   Bilirubin, Direct 0.3 (H) 0.0 - 0.2 mg/dL   Indirect Bilirubin 0.5 0.3 - 0.9 mg/dL    Comment: Performed at [facility], [address], [place], [place] [address]  Glucose, capillary     Status: Abnormal   Collection Time: 08/24/23 12:56 PM  Result Value Ref Range   Glucose-Capillary 173 (H) 70 - 99 mg/dL    Comment: Glucose reference range applies only to samples taken after fasting for at least 8 hours.    DG HIP UNILAT WITH PELVIS 2-3 VIEWS RIGHT Result Date: 08/24/2023 CLINICAL DATA:  Right hip swelling and pain. EXAM: DG HIP (WITH OR WITHOUT PELVIS) 2-3V RIGHT COMPARISON:  MR right hip 08/18/2023, CT abdomen pelvis 06/23/2023. FINDINGS: Complete resorption of the right femoral head and neck with superior dislocation of the right femoral shaft. Erosive changes in the right acetabulum. Findings are similar to 06/23/2023. Mild subchondral sclerosis and osteophytosis in the left hip. IMPRESSION: Complete resorption/destruction of the right femoral head and neck with involvement of the right acetabulum and superior displacement of the right femoral shaft. Findings are worrisome for osteomyelitis. Please correlate with recent MR right hip  performed 08/18/2023. Electronically Signed   By: Newell Eke M.D.   On: 08/24/2023 11:37  Review of Systems  HENT:  Negative for ear discharge, ear pain, hearing loss and tinnitus.   Eyes:  Negative for photophobia and pain.  Respiratory:  Negative for cough and shortness of breath.   Cardiovascular:  Negative for chest pain.  Gastrointestinal:  Negative for abdominal pain, nausea and vomiting.  Genitourinary:  Negative for dysuria, flank pain, frequency and urgency.  Musculoskeletal:  Negative for back pain, myalgias and neck pain.  Neurological:  Negative for dizziness and headaches.  Hematological:  Does not bruise/bleed easily.  Psychiatric/Behavioral:  The patient is not nervous/anxious.    Blood pressure (!) 106/49, pulse 89, temperature 97.8 F (36.6 C), temperature source Axillary, resp. rate 18, height 5' 10 (1.778 m), weight 83.2 kg, SpO2 100%. Physical Exam Constitutional:      General: He is not in acute distress.    Appearance: He is well-developed. He is not diaphoretic.  HENT:     Head: Normocephalic and atraumatic.  Eyes:     General: No scleral icterus.       Right eye: No discharge.        Left eye: No discharge.     Conjunctiva/sclera: Conjunctivae normal.  Cardiovascular:     Rate and Rhythm: Normal rate and regular rhythm.  Pulmonary:     Effort: Pulmonary effort is normal. No respiratory distress.  Musculoskeletal:     Cervical back: Normal range of motion.     Comments: RLE No traumatic wounds, ecchymosis, or rash  Nontender, no hip pain with flex/rotation. Some lower leg and knee pain with AROM hip  No knee or ankle effusion  Knee stable to varus/ valgus and anterior/posterior stress  Sens DPN, SPN, TN intact  Motor EHL, ext, flex, evers 5/5  DP 2+, PT 2+, No significant edema  Skin:    General: Skin is warm and dry.  Neurological:     Mental Status: He is alert.  Psychiatric:        Mood and Affect: Mood normal.        Behavior: Behavior  normal.     Assessment/Plan: Right hip pathology -- He's had some destructive process going on there for quite some time as there was e/o in 2023. Doubt it's been infection this whole time. Will plan on I&D tomorrow with Dr. Kendal; please keep NPO after MN. Unfortunately no hip replacement options for pt as there's not enough acetabulum left.    Ozell DOROTHA Ned, PA-C Orthopedic Surgery [phone] 08/24/2023, 2:09 PM

## 2023-08-24 NOTE — TOC CM/SW Note (Signed)
 Transition of Care [facility]) - Inpatient Brief Assessment   Patient Details  Name: [name] MRN: [id number] Date of Birth: 1955-07-14  Transition of Care [facility]) CM/SW Contact:    Lauraine FORBES Saa, LCSW Phone Number: 08/24/2023, 12:27 PM   Clinical Narrative:  12:27 PM Per chart review, patient resides at home with relatives. Patient has a PCP and insurance. Patient does not have SNF history. Patient has HH/DME history with [facility]. Patient's preferred pharmacy is [facility] [place]. No TOC needs were identified at this time. TOC will continue to follow and be available to assist.  Transition of Care Asessment: Insurance and Status: Insurance coverage has been reviewed Patient has primary care physician: Yes Home environment has been reviewed: Private Residence Prior level of function:: N/A Prior/Current Home Services: No current home services (Has recent HH/DME history) Social Drivers of Health Review: SDOH reviewed no interventions necessary Readmission risk has been reviewed: Yes Transition of care needs: no transition of care needs at this time

## 2023-08-25 ENCOUNTER — Inpatient Hospital Stay (HOSPITAL_COMMUNITY)

## 2023-08-25 ENCOUNTER — Inpatient Hospital Stay (HOSPITAL_COMMUNITY): Admitting: Anesthesiology

## 2023-08-25 ENCOUNTER — Encounter (HOSPITAL_COMMUNITY)
Admission: EM | Disposition: A | Discharge status: Home Health | Payer: Self-pay | Source: Ambulatory Visit | Attending: Internal Medicine

## 2023-08-25 ENCOUNTER — Other Ambulatory Visit: Payer: Self-pay

## 2023-08-25 DIAGNOSIS — R7881 Bacteremia: Secondary | ICD-10-CM | POA: Diagnosis not present

## 2023-08-25 DIAGNOSIS — M86151 Other acute osteomyelitis, right femur: Secondary | ICD-10-CM

## 2023-08-25 DIAGNOSIS — M009 Pyogenic arthritis, unspecified: Secondary | ICD-10-CM | POA: Diagnosis not present

## 2023-08-25 DIAGNOSIS — E039 Hypothyroidism, unspecified: Secondary | ICD-10-CM | POA: Diagnosis not present

## 2023-08-25 DIAGNOSIS — M60009 Infective myositis, unspecified site: Secondary | ICD-10-CM | POA: Diagnosis not present

## 2023-08-25 DIAGNOSIS — E1165 Type 2 diabetes mellitus with hyperglycemia: Secondary | ICD-10-CM

## 2023-08-25 DIAGNOSIS — M00051 Staphylococcal arthritis, right hip: Secondary | ICD-10-CM | POA: Diagnosis not present

## 2023-08-25 DIAGNOSIS — B9562 Methicillin resistant Staphylococcus aureus infection as the cause of diseases classified elsewhere: Secondary | ICD-10-CM | POA: Diagnosis not present

## 2023-08-25 DIAGNOSIS — F0390 Unspecified dementia without behavioral disturbance: Secondary | ICD-10-CM | POA: Diagnosis not present

## 2023-08-25 HISTORY — PX: INCISION AND DRAINAGE OF DEEP ABSCESS, HIP JOINT, ANTERIOR APPROACH: SHX7327

## 2023-08-25 LAB — URINE CULTURE

## 2023-08-25 LAB — COMPREHENSIVE METABOLIC PANEL WITH GFR
ALT: 20 U/L (ref 0–44)
AST: 21 U/L (ref 15–41)
Albumin: 1.5 g/dL — ABNORMAL LOW (ref 3.5–5.0)
Alkaline Phosphatase: 88 U/L (ref 38–126)
Anion gap: 8 (ref 5–15)
BUN: 25 mg/dL — ABNORMAL HIGH (ref 8–23)
CO2: 19 mmol/L — ABNORMAL LOW (ref 22–32)
Calcium: 8.7 mg/dL — ABNORMAL LOW (ref 8.9–10.3)
Chloride: 101 mmol/L (ref 98–111)
Creatinine, Ser: 1.69 mg/dL — ABNORMAL HIGH (ref 0.61–1.24)
GFR, Estimated: 44 mL/min — ABNORMAL LOW (ref >60)
Glucose, Bld: 145 mg/dL — ABNORMAL HIGH (ref 70–99)
Potassium: 4.4 mmol/L (ref 3.5–5.1)
Sodium: 128 mmol/L — ABNORMAL LOW (ref 135–145)
Total Bilirubin: 0.9 mg/dL (ref 0.0–1.2)
Total Protein: 7.6 g/dL (ref 6.5–8.1)

## 2023-08-25 LAB — PREPARE RBC (CROSSMATCH)

## 2023-08-25 LAB — CBC
HCT: 24.8 % — ABNORMAL LOW (ref 39.0–52.0)
Hemoglobin: 7.6 g/dL — ABNORMAL LOW (ref 13.0–17.0)
MCH: 26.3 pg (ref 26.0–34.0)
MCHC: 30.6 g/dL (ref 30.0–36.0)
MCV: 85.8 fL (ref 80.0–100.0)
Platelets: 408 10*3/uL — ABNORMAL HIGH (ref 150–400)
RBC: 2.89 MIL/uL — ABNORMAL LOW (ref 4.22–5.81)
RDW: 17 % — ABNORMAL HIGH (ref 11.5–15.5)
WBC: 11.6 10*3/uL — ABNORMAL HIGH (ref 4.0–10.5)
nRBC: 0 % (ref 0.0–0.2)

## 2023-08-25 LAB — SEDIMENTATION RATE
Sed Rate: >140 mm/h — ABNORMAL HIGH (ref 0–16)
Sed Rate: >140 mm/h — ABNORMAL HIGH (ref 0–16)

## 2023-08-25 LAB — GLUCOSE, CAPILLARY
Glucose-Capillary: 130 mg/dL — ABNORMAL HIGH (ref 70–99)
Glucose-Capillary: 144 mg/dL — ABNORMAL HIGH (ref 70–99)
Glucose-Capillary: 150 mg/dL — ABNORMAL HIGH (ref 70–99)
Glucose-Capillary: 164 mg/dL — ABNORMAL HIGH (ref 70–99)
Glucose-Capillary: 165 mg/dL — ABNORMAL HIGH (ref 70–99)
Glucose-Capillary: 185 mg/dL — ABNORMAL HIGH (ref 70–99)

## 2023-08-25 LAB — C-REACTIVE PROTEIN: CRP: 19.3 mg/dL — ABNORMAL HIGH (ref <1.0)

## 2023-08-25 LAB — CK: Total CK: 18 U/L — ABNORMAL LOW (ref 49–397)

## 2023-08-25 SURGERY — INCISION AND DRAINAGE OF DEEP ABSCESS, HIP JOINT, ANTERIOR APPROACH
Anesthesia: General | Site: Hip | Laterality: Right

## 2023-08-25 MED ORDER — HYDROMORPHONE HCL 1 MG/ML IJ SOLN
INTRAMUSCULAR | Status: AC
Start: 2023-08-25 — End: 2023-08-25
  Filled 2023-08-25: qty 0.5

## 2023-08-25 MED ORDER — EPHEDRINE SULFATE-NACL 50-0.9 MG/10ML-% IV SOSY
PREFILLED_SYRINGE | INTRAVENOUS | Status: DC | PRN
  Administered 2023-08-25: 10 mg via INTRAVENOUS

## 2023-08-25 MED ORDER — POLYETHYLENE GLYCOL 3350 17 G PO PACK: 17.0000 g | PACK | Freq: Every day | ORAL | Status: DC | PRN

## 2023-08-25 MED ORDER — ROCURONIUM BROMIDE 10 MG/ML (PF) SYRINGE
PREFILLED_SYRINGE | INTRAVENOUS | Status: AC
  Filled 2023-08-25: qty 10

## 2023-08-25 MED ORDER — LIDOCAINE 2% (20 MG/ML) 5 ML SYRINGE
INTRAMUSCULAR | Status: DC | PRN
  Administered 2023-08-25: 60 mg via INTRAVENOUS

## 2023-08-25 MED ORDER — CHLORHEXIDINE GLUCONATE 0.12 % MT SOLN: 15.0000 mL | Freq: Once | OROMUCOSAL | Status: AC

## 2023-08-25 MED ORDER — FENTANYL CITRATE (PF) 250 MCG/5ML IJ SOLN
INTRAMUSCULAR | Status: DC | PRN
  Administered 2023-08-25: 50 ug via INTRAVENOUS
  Administered 2023-08-25: 100 ug via INTRAVENOUS
  Administered 2023-08-25 (×2): 50 ug via INTRAVENOUS

## 2023-08-25 MED ORDER — MORPHINE SULFATE (PF) 2 MG/ML IV SOLN
0.5000 mg | INTRAVENOUS | Status: DC | PRN
  Administered 2023-08-27: 0.5 mg via INTRAVENOUS
  Filled 2023-08-25: qty 1

## 2023-08-25 MED ORDER — VANCOMYCIN HCL 1000 MG IV SOLR
INTRAVENOUS | Status: DC | PRN
  Administered 2023-08-25: 1000 mg

## 2023-08-25 MED ORDER — LACTATED RINGERS IV SOLN: INTRAVENOUS | Status: DC

## 2023-08-25 MED ORDER — ONDANSETRON HCL 4 MG/2ML IJ SOLN
INTRAMUSCULAR | Status: AC
  Filled 2023-08-25: qty 2

## 2023-08-25 MED ORDER — SODIUM CHLORIDE 0.9 % IV SOLN: INTRAVENOUS | Status: DC | PRN

## 2023-08-25 MED ORDER — BACITRACIN ZINC 500 UNIT/GM EX OINT
TOPICAL_OINTMENT | CUTANEOUS | Status: AC
Start: 2023-08-25 — End: 2023-08-25
  Filled 2023-08-25: qty 28.35

## 2023-08-25 MED ORDER — SODIUM CHLORIDE 0.9 % IR SOLN
Status: DC | PRN
  Administered 2023-08-25: 3000 mL

## 2023-08-25 MED ORDER — ALBUMIN HUMAN 5 % IV SOLN: INTRAVENOUS | Status: DC | PRN

## 2023-08-25 MED ORDER — PHENYLEPHRINE HCL-NACL 20-0.9 MG/250ML-% IV SOLN
INTRAVENOUS | Status: DC | PRN
  Administered 2023-08-25: 40 ug/min via INTRAVENOUS

## 2023-08-25 MED ORDER — ONDANSETRON HCL 4 MG/2ML IJ SOLN
INTRAMUSCULAR | Status: DC | PRN
  Administered 2023-08-25: 4 mg via INTRAVENOUS

## 2023-08-25 MED ORDER — SODIUM CHLORIDE 0.9% FLUSH: 10.0000 mL | INTRAVENOUS | Status: DC | PRN

## 2023-08-25 MED ORDER — PHENYLEPHRINE 80 MCG/ML (10ML) SYRINGE FOR IV PUSH (FOR BLOOD PRESSURE SUPPORT)
PREFILLED_SYRINGE | INTRAVENOUS | Status: DC | PRN
  Administered 2023-08-25 (×3): 160 ug via INTRAVENOUS

## 2023-08-25 MED ORDER — METHOCARBAMOL 500 MG PO TABS
500.0000 mg | ORAL_TABLET | Freq: Four times a day (QID) | ORAL | Status: DC | PRN
  Administered 2023-08-26 – 2023-08-28 (×3): 500 mg via ORAL
  Filled 2023-08-25 (×3): qty 1

## 2023-08-25 MED ORDER — TOBRAMYCIN SULFATE 1.2 G IJ SOLR
INTRAMUSCULAR | Status: AC
Start: 2023-08-25 — End: 2023-08-25
  Filled 2023-08-25: qty 1.2

## 2023-08-25 MED ORDER — HYDROMORPHONE HCL 1 MG/ML IJ SOLN
INTRAMUSCULAR | Status: DC | PRN
  Administered 2023-08-25: .5 mg via INTRAVENOUS

## 2023-08-25 MED ORDER — CHLORHEXIDINE GLUCONATE 4 % EX SOLN: 1.0000 | CUTANEOUS | 1 refills | Status: AC

## 2023-08-25 MED ORDER — 0.9 % SODIUM CHLORIDE (POUR BTL) OPTIME
TOPICAL | Status: DC | PRN
  Administered 2023-08-25: 1000 mL

## 2023-08-25 MED ORDER — CHLORHEXIDINE GLUCONATE 0.12 % MT SOLN
OROMUCOSAL | Status: AC
  Administered 2023-08-25: 15 mL via OROMUCOSAL
  Filled 2023-08-25: qty 15

## 2023-08-25 MED ORDER — PROPOFOL 10 MG/ML IV BOLUS
INTRAVENOUS | Status: AC
  Filled 2023-08-25: qty 20

## 2023-08-25 MED ORDER — OXYCODONE HCL 5 MG/5ML PO SOLN: 5.0000 mg | Freq: Once | ORAL | Status: DC | PRN

## 2023-08-25 MED ORDER — METOCLOPRAMIDE HCL 5 MG/ML IJ SOLN: 5.0000 mg | Freq: Three times a day (TID) | INTRAMUSCULAR | Status: DC | PRN

## 2023-08-25 MED ORDER — ORAL CARE MOUTH RINSE: 15.0000 mL | Freq: Once | OROMUCOSAL | Status: AC

## 2023-08-25 MED ORDER — ONDANSETRON HCL 4 MG/2ML IJ SOLN: 4.0000 mg | Freq: Once | INTRAMUSCULAR | Status: DC | PRN

## 2023-08-25 MED ORDER — MUPIROCIN 2 % EX OINT
1.0000 | TOPICAL_OINTMENT | Freq: Two times a day (BID) | CUTANEOUS | 0 refills | Status: AC
Start: 2023-08-25 — End: 2023-09-24

## 2023-08-25 MED ORDER — METHOCARBAMOL 1000 MG/10ML IJ SOLN
500.0000 mg | Freq: Four times a day (QID) | INTRAMUSCULAR | Status: DC | PRN
  Administered 2023-08-28: 500 mg via INTRAVENOUS
  Filled 2023-08-25: qty 10

## 2023-08-25 MED ORDER — PROPOFOL 10 MG/ML IV BOLUS
INTRAVENOUS | Status: DC | PRN
  Administered 2023-08-25: 200 mg via INTRAVENOUS

## 2023-08-25 MED ORDER — ACETAMINOPHEN 10 MG/ML IV SOLN
INTRAVENOUS | Status: AC
  Filled 2023-08-25: qty 100

## 2023-08-25 MED ORDER — AMISULPRIDE (ANTIEMETIC) 5 MG/2ML IV SOLN: 10.0000 mg | Freq: Once | INTRAVENOUS | Status: DC | PRN

## 2023-08-25 MED ORDER — OXYCODONE HCL 5 MG PO TABS
2.5000 mg | ORAL_TABLET | ORAL | Status: DC | PRN
  Administered 2023-08-26 – 2023-08-27 (×2): 2.5 mg via ORAL
  Filled 2023-08-25 (×3): qty 1

## 2023-08-25 MED ORDER — LIDOCAINE 2% (20 MG/ML) 5 ML SYRINGE
INTRAMUSCULAR | Status: AC
  Filled 2023-08-25: qty 5

## 2023-08-25 MED ORDER — SODIUM CHLORIDE 0.9 % IV SOLN
10.0000 mL/h | Freq: Once | INTRAVENOUS | Status: DC
Start: 2023-08-25 — End: 2023-08-31

## 2023-08-25 MED ORDER — FENTANYL CITRATE (PF) 250 MCG/5ML IJ SOLN
INTRAMUSCULAR | Status: AC
Start: 2023-08-25 — End: 2023-08-25
  Filled 2023-08-25: qty 5

## 2023-08-25 MED ORDER — TOBRAMYCIN SULFATE 1.2 G IJ SOLR
INTRAMUSCULAR | Status: DC | PRN
  Administered 2023-08-25: 1.2 g

## 2023-08-25 MED ORDER — FENTANYL CITRATE (PF) 100 MCG/2ML IJ SOLN: 25.0000 ug | INTRAMUSCULAR | Status: DC | PRN

## 2023-08-25 MED ORDER — METOCLOPRAMIDE HCL 5 MG PO TABS: 5.0000 mg | ORAL_TABLET | Freq: Three times a day (TID) | ORAL | Status: DC | PRN

## 2023-08-25 MED ORDER — ACETAMINOPHEN 10 MG/ML IV SOLN
INTRAVENOUS | Status: DC | PRN
  Administered 2023-08-25: 1000 mg via INTRAVENOUS

## 2023-08-25 MED ORDER — OXYCODONE HCL 5 MG PO TABS: 5.0000 mg | ORAL_TABLET | Freq: Once | ORAL | Status: DC | PRN

## 2023-08-25 MED ORDER — DOCUSATE SODIUM 100 MG PO CAPS
100.0000 mg | ORAL_CAPSULE | Freq: Two times a day (BID) | ORAL | Status: DC
  Administered 2023-08-25 – 2023-09-02 (×15): 100 mg via ORAL
  Filled 2023-08-25 (×15): qty 1

## 2023-08-25 MED ORDER — VANCOMYCIN HCL 1000 MG IV SOLR
INTRAVENOUS | Status: AC
  Filled 2023-08-25: qty 20

## 2023-08-25 SURGICAL SUPPLY — 44 items
BAG COUNTER SPONGE SURGICOUNT (BAG) ×1 IMPLANT
BNDG COHESIVE 4X5 TAN STRL LF (GAUZE/BANDAGES/DRESSINGS) ×1 IMPLANT
BNDG GAUZE DERMACEA FLUFF 4 (GAUZE/BANDAGES/DRESSINGS) ×2 IMPLANT
BRUSH SCRUB EZ PLAIN DRY (MISCELLANEOUS) ×2 IMPLANT
CHLORAPREP W/TINT 26 (MISCELLANEOUS) ×1 IMPLANT
CNTNR URN SCR LID CUP LEK RST (MISCELLANEOUS) IMPLANT
COVER MAYO STAND STRL (DRAPES) ×1 IMPLANT
COVER SURGICAL LIGHT HANDLE (MISCELLANEOUS) ×2 IMPLANT
DERMABOND ADVANCED .7 DNX12 (GAUZE/BANDAGES/DRESSINGS) IMPLANT
DRAPE SURG 17X23 STRL (DRAPES) ×1 IMPLANT
DRAPE SURG ORHT 6 SPLT 77X108 (DRAPES) ×1 IMPLANT
DRAPE U-SHAPE 47X51 STRL (DRAPES) ×1 IMPLANT
DRSG ADAPTIC 3X8 NADH LF (GAUZE/BANDAGES/DRESSINGS) ×1 IMPLANT
DRSG MEPILEX POST OP 4X8 (GAUZE/BANDAGES/DRESSINGS) IMPLANT
ELECTRODE REM PT RTRN 9FT ADLT (ELECTROSURGICAL) IMPLANT
EVACUATOR 1/8 PVC DRAIN (DRAIN) IMPLANT
GAUZE SPONGE 4X4 12PLY STRL (GAUZE/BANDAGES/DRESSINGS) ×1 IMPLANT
GLOVE BIO SURGEON STRL SZ 6.5 (GLOVE) ×3 IMPLANT
GLOVE BIO SURGEON STRL SZ7.5 (GLOVE) ×4 IMPLANT
GLOVE BIOGEL PI IND STRL 6.5 (GLOVE) ×1 IMPLANT
GLOVE BIOGEL PI IND STRL 7.5 (GLOVE) ×1 IMPLANT
GOWN STRL REUS W/ TWL LRG LVL3 (GOWN DISPOSABLE) ×2 IMPLANT
KIT BASIN OR (CUSTOM PROCEDURE TRAY) ×1 IMPLANT
KIT TURNOVER KIT B (KITS) ×1 IMPLANT
MANIFOLD NEPTUNE II (INSTRUMENTS) ×1 IMPLANT
NS IRRIG 1000ML POUR BTL (IV SOLUTION) ×1 IMPLANT
PACK ORTHO EXTREMITY (CUSTOM PROCEDURE TRAY) ×1 IMPLANT
PAD ARMBOARD POSITIONER FOAM (MISCELLANEOUS) ×2 IMPLANT
PADDING CAST COTTON 6X4 STRL (CAST SUPPLIES) ×1 IMPLANT
SET HNDPC FAN SPRY TIP SCT (DISPOSABLE) IMPLANT
SPONGE T-LAP 18X18 ~~LOC~~+RFID (SPONGE) ×1 IMPLANT
SUT ETHILON 2 0 FS 18 (SUTURE) ×2 IMPLANT
SUT ETHILON 3 0 PS 1 (SUTURE) ×2 IMPLANT
SUT MNCRL AB 3-0 PS2 27 (SUTURE) IMPLANT
SUT MON AB 2-0 CT1 36 (SUTURE) ×1 IMPLANT
SUT PDS AB 0 CT 36 (SUTURE) IMPLANT
SUT VIC AB 0 CT1 27XBRD ANBCTR (SUTURE) IMPLANT
SWAB CULTURE ESWAB REG 1ML (MISCELLANEOUS) IMPLANT
TOWEL GREEN STERILE (TOWEL DISPOSABLE) ×2 IMPLANT
TOWEL GREEN STERILE FF (TOWEL DISPOSABLE) ×1 IMPLANT
TUBE CONNECTING 12X1/4 (SUCTIONS) ×1 IMPLANT
UNDERPAD 30X36 HEAVY ABSORB (UNDERPADS AND DIAPERS) ×1 IMPLANT
WATER STERILE IRR 1000ML POUR (IV SOLUTION) ×1 IMPLANT
YANKAUER SUCT BULB TIP NO VENT (SUCTIONS) ×1 IMPLANT

## 2023-08-25 NOTE — Interval H&P Note (Signed)
 History and Physical Interval Note:  08/25/2023 7:27 AM  [name]  has presented today for surgery, with the diagnosis of Right septic hip.  The various methods of treatment have been discussed with the patient and family. After consideration of risks, benefits and other options for treatment, the patient has consented to  Procedure(s): INCISION AND DRAINAGE OF DEEP ABSCESS, HIP JOINT, ANTERIOR APPROACH (Right) as a surgical intervention.  The patient's history has been reviewed, patient examined, no change in status, stable for surgery.  I have reviewed the patient's chart and labs.  Questions were answered to the patient's satisfaction.     Princessa Lesmeister P Loribeth Katich

## 2023-08-25 NOTE — Anesthesia Preprocedure Evaluation (Addendum)
 Anesthesia Evaluation  Patient identified by MRN, date of birth, ID band Patient awake    Reviewed: Allergy & Precautions, NPO status , Patient's Chart, lab work & pertinent test results  Airway Mallampati: III  TM Distance: >3 FB Neck ROM: Full    Dental  (+) Poor Dentition, Missing, Dental Advisory Given   Pulmonary former smoker   Pulmonary exam normal breath sounds clear to auscultation       Cardiovascular negative cardio ROS Normal cardiovascular exam Rhythm:Regular Rate:Normal  Echo 08/2023 1. Left ventricular ejection fraction, by estimation, is 55 to 60%. The  left ventricle has normal function. The left ventricle has no regional  wall motion abnormalities. Left ventricular diastolic parameters are  indeterminate.   2. Right ventricular systolic function is normal. The right ventricular  size is normal. Tricuspid regurgitation signal is inadequate for assessing  PA pressure.   3. The aortic valve is tricuspid. There is mild calcification of the  aortic valve.   4. The inferior vena cava is normal in size with greater than 50%  respiratory variability, suggesting right atrial pressure of 3 mmHg.   5. Limited echo with no color doppler. Poor images, no valvular  vegetation visualized but images limited.     Neuro/Psych  PSYCHIATRIC DISORDERS    Schizophrenia Dementia negative neurological ROS     GI/Hepatic Neg liver ROS,GERD  Controlled,,  Endo/Other  diabetes, Well Controlled, Type 2, Oral Hypoglycemic AgentsHypothyroidism    Renal/GU Renal diseaseCr 1.69  negative genitourinary   Musculoskeletal  (+) Arthritis , Osteoarthritis,    Abdominal   Peds  Hematology  (+) Blood dyscrasia, anemia Hb 7.6, plt 408   Anesthesia Other Findings   Reproductive/Obstetrics negative OB ROS                              Anesthesia Physical Anesthesia Plan  ASA: 3  Anesthesia Plan: General    Post-op Pain Management: Tylenol  PO (pre-op)*   Induction: Intravenous  PONV Risk Score and Plan: 2 and Ondansetron , Dexamethasone, Midazolam  and Treatment may vary due to age or medical condition  Airway Management Planned: LMA  Additional Equipment: None  Intra-op Plan:   Post-operative Plan: Extubation in OR  Informed Consent: I have reviewed the patients History and Physical, chart, labs and discussed the procedure including the risks, benefits and alternatives for the proposed anesthesia with the patient or authorized representative who has indicated his/her understanding and acceptance.     Dental advisory given  Plan Discussed with: CRNA  Anesthesia Plan Comments: (Type and screen)         Anesthesia Quick Evaluation

## 2023-08-25 NOTE — Progress Notes (Signed)
 Subjective: No new complaints   Antibiotics:  Anti-infectives (From admission, onward)    Start     Dose/Rate Route Frequency Ordered Stop   08/25/23 0824  tobramycin (NEBCIN) powder  Status:  Discontinued          As needed 08/25/23 0824 08/25/23 0855   08/25/23 0727  vancomycin  (VANCOCIN ) powder  Status:  Discontinued          As needed 08/25/23 0727 08/25/23 0855   08/25/23 0600  vancomycin  (VANCOREADY) IVPB 1250 mg/250 mL  Status:  Discontinued        1,250 mg 166.7 mL/hr over 90 Minutes Intravenous Every 24 hours 08/24/23 0625 08/24/23 1620   08/25/23 0600  ceFAZolin (ANCEF) IVPB 2g/100 mL premix        2 g 200 mL/hr over 30 Minutes Intravenous On call to O.R. 08/24/23 1725 08/25/23 0806   08/25/23 0600  vancomycin  (VANCOCIN ) IVPB 1000 mg/200 mL premix        1,000 mg 200 mL/hr over 60 Minutes Intravenous On call to O.R. 08/24/23 1725 08/25/23 0810   08/24/23 1730  DAPTOmycin  (CUBICIN ) IVPB 700 mg/115mL premix        8 mg/kg  83.2 kg 200 mL/hr over 30 Minutes Intravenous Daily 08/24/23 1632     08/24/23 0300  vancomycin  (VANCOREADY) IVPB 2000 mg/400 mL        2,000 mg 200 mL/hr over 120 Minutes Intravenous  Once 08/24/23 0258 08/24/23 0518       Medications: Scheduled Meds:  Chlorhexidine  Gluconate Cloth  6 each Topical Q0600   docusate sodium   100 mg Oral BID   donepezil  5 mg Oral Daily   enoxaparin  (LOVENOX ) injection  40 mg Subcutaneous Q24H   insulin  aspart  0-9 Units Subcutaneous TID WC   levothyroxine   75 mcg Oral Daily   mupirocin  ointment   Nasal BID   pantoprazole   40 mg Oral Daily   [START ON 08/26/2023] rosuvastatin   5 mg Oral Weekly   Continuous Infusions:  sodium chloride      DAPTOmycin  700 mg (08/24/23 1723)   PRN Meds:.acetaminophen  **OR** acetaminophen , methocarbamol  **OR** methocarbamol  (ROBAXIN ) injection, metoCLOPramide **OR** metoCLOPramide (REGLAN) injection, morphine  injection, ondansetron  **OR** ondansetron  (ZOFRAN ) IV,  oxyCODONE , polyethylene glycol, sodium chloride  flush    Objective: Weight change: 1.4 kg  Intake/Output Summary (Last 24 hours) at 08/25/2023 1350 Last data filed at 08/25/2023 1200 Gross per 24 hour  Intake 1926.67 ml  Output 2750 ml  Net -823.33 ml   Blood pressure (!) 111/58, pulse 91, temperature 97.8 F (36.6 C), resp. rate 18, height (P) 5' 10 (1.778 m), weight (P) 83.2 kg, SpO2 98%. Temp:  [97.8 F (36.6 C)-98.9 F (37.2 C)] 97.8 F (36.6 C) (07/03 1158) Pulse Rate:  [88-108] 91 (07/03 1158) Resp:  [12-19] 18 (07/03 1158) BP: (103-135)/(55-92) 111/58 (07/03 1158) SpO2:  [93 %-100 %] 98 % (07/03 1158) Weight:  [83.2 kg] (P) 83.2 kg (07/03 0626)  Physical Exam: Physical Exam Constitutional:      Appearance: He is well-developed.  HENT:     Head: Normocephalic and atraumatic.  Eyes:     Conjunctiva/sclera: Conjunctivae normal.  Cardiovascular:     Rate and Rhythm: Normal rate and regular rhythm.  Pulmonary:     Effort: Pulmonary effort is normal. No respiratory distress.     Breath sounds: No wheezing.  Abdominal:     General: There is no distension.     Palpations: Abdomen is soft.  Musculoskeletal:     Cervical back: Normal range of motion and neck supple.  Skin:    General: Skin is warm and dry.     Findings: No erythema or rash.  Neurological:     General: No focal deficit present.     Mental Status: He is alert and oriented to person, place, and time.  Psychiatric:        Mood and Affect: Mood normal.        Behavior: Behavior normal. Behavior is cooperative.        Cognition and Memory: He exhibits impaired remote memory.        Judgment: Judgment normal.     Comments: Much more alert today      CBC:    BMET Recent Labs    08/24/23 0246 08/25/23 0433  NA 129* 128*  K 4.2 4.4  CL 98 101  CO2 21* 19*  GLUCOSE 238* 145*  BUN 32* 25*  CREATININE 1.77* 1.69*  CALCIUM  8.9 8.7*     Liver Panel  Recent Labs    08/24/23 0246  08/25/23 0433  PROT 8.1 7.6  ALBUMIN  1.8* 1.5*  AST 31 21  ALT 23 20  ALKPHOS 107 88  BILITOT 0.8 0.9  BILIDIR 0.3*  --   IBILI 0.5  --        Sedimentation Rate Recent Labs    08/25/23 0433  ESRSEDRATE >140*   C-Reactive Protein Recent Labs    08/25/23 0433  CRP 19.3*    Micro Results: Recent Results (from the past 720 hours)  Culture, blood (single) w Reflex to ID Panel     Status: Abnormal (Preliminary result)   Collection Time: 08/22/23  3:20 PM   Specimen: Blood  Result Value Ref Range Status   MICRO NUMBER: [id number]  Preliminary   SPECIMEN QUALITY: Adequate  Preliminary   Source BLOOD 1  Preliminary   STATUS: PRELIMINARY  Preliminary   Result:   Preliminary    Pos 24.8 No growth to date. Culture is continuously monitored for a total of 120 hours incubation. A change in status will result in a phone report followed by an updated printed culture report.   ISOLATE 1: Gram positive cocci isolated (AA)  Preliminary    Comment: Gram positive cocci isolated , from anaerobic bottle only   COMMENT: Aerobic and anaerobic bottle received.  Preliminary  Culture, blood (single) w Reflex to ID Panel     Status: Abnormal (Preliminary result)   Collection Time: 08/22/23  3:22 PM   Specimen: Blood  Result Value Ref Range Status   MICRO NUMBER: [id number]  Preliminary   SPECIMEN QUALITY: Adequate  Preliminary   Source BLOOD 2  Preliminary   STATUS: PRELIMINARY  Preliminary   Result:   Preliminary    Pos 21.3 Pos 25.2 No growth to date. Culture is continuously monitored for a total of 120 hours incubation. A change in status will result in a phone report followed by an updated printed culture report.   ISOLATE 1: Gram positive cocci isolated (AA)  Preliminary    Comment: Gram positive cocci isolated , from aerobic and anaerobic bottles   COMMENT: Aerobic and anaerobic bottle received.  Preliminary  Blood culture (routine x 2)     Status: None (Preliminary result)    Collection Time: 08/24/23  2:46 AM   Specimen: BLOOD LEFT WRIST  Result Value Ref Range Status   Specimen Description BLOOD LEFT WRIST  Final   Special Requests  Final    BOTTLES DRAWN AEROBIC AND ANAEROBIC Blood Culture adequate volume   Culture   Final    NO GROWTH < 12 HOURS Performed at [facility], [address], [place], [place] [address]    Report Status PENDING  Incomplete  Blood culture (routine x 2)     Status: None (Preliminary result)   Collection Time: 08/24/23  2:46 AM   Specimen: BLOOD RIGHT FOREARM  Result Value Ref Range Status   Specimen Description BLOOD RIGHT FOREARM  Final   Special Requests   Final    BOTTLES DRAWN AEROBIC AND ANAEROBIC Blood Culture adequate volume   Culture   Final    NO GROWTH < 12 HOURS Performed at [facility], [address] [place], [place] [address]    Report Status PENDING  Incomplete  MRSA Next Gen by PCR, Nasal     Status: Abnormal   Collection Time: 08/24/23 11:45 AM   Specimen: Nasal Mucosa; Nasal Swab  Result Value Ref Range Status   MRSA by PCR Next Gen DETECTED (A) NOT DETECTED Final    Comment: RESULT CALLED TO, READ BACK BY AND VERIFIED WITH: W EARLY,RN@1546  08/24/23 MK (NOTE) The GeneXpert MRSA Assay (FDA approved for NASAL specimens only), is one component of a comprehensive MRSA colonization surveillance program. It is not intended to diagnose MRSA infection nor to guide or monitor treatment for MRSA infections. Test performance is not FDA approved in patients less than 27 years old. Performed at [facility], [address], [place], [place] [address]   Aerobic/Anaerobic Culture w Gram Stain (surgical/deep wound)     Status: None (Preliminary result)   Collection Time: 08/25/23  8:04 AM   Specimen: PATH Soft tissue  Result Value Ref Range Status   Specimen Description TISSUE  Final   Special Requests ABSCESS RIGHT HIP  Final   Gram Stain   Final    NO WBC SEEN NO ORGANISMS SEEN Performed at [facility], [address] [place], [place] [address]    Culture PENDING  Incomplete   Report Status PENDING  Incomplete  Aerobic/Anaerobic Culture w Gram Stain (surgical/deep wound)     Status: None (Preliminary result)   Collection Time: 08/25/23  8:10 AM   Specimen: PATH Soft tissue  Result Value Ref Range Status   Specimen Description TISSUE  Final   Special Requests ABSCESS RIGHT HIP  Final   Gram Stain   Final    NO WBC SEEN NO ORGANISMS SEEN Performed at [facility], [address] [place], [place] [address]    Culture PENDING  Incomplete   Report Status PENDING  Incomplete    Studies/Results: DG HIP UNILAT WITH PELVIS 1V RIGHT Result Date: 08/25/2023 CLINICAL DATA:  Right hip debridement for septic joint EXAM: DG HIP (WITH OR WITHOUT PELVIS) 1V RIGHT COMPARISON:  Plain film from the previous day. FLUOROSCOPY TIME:  Radiation Exposure Index (as provided by the fluoroscopic device): 0.42 mGy If the device does not provide the exposure index: Fluoroscopy Time:  3 seconds Number of Acquired Images:  1 FINDINGS: Single spot film is obtained with a grasping device debriding the right acetabulum. IMPRESSION: Right hip joint debridement. Electronically Signed   By: Oneil Devonshire M.D.   On: 08/25/2023 10:23   DG C-Arm 1-60 Min-No Report Result Date: 08/25/2023 Fluoroscopy was utilized by the requesting physician.  No radiographic interpretation.   ECHOCARDIOGRAM LIMITED Result Date: 08/24/2023    ECHOCARDIOGRAM LIMITED REPORT  Patient Name:   [name] Date of Exam: 08/24/2023 Medical Rec #:  [id number]       Height:       70.0 in Accession #:    [id number]      Weight:       183.4 lb Date of Birth:  03-11-1955       BSA:          2.012 m Patient Age:    68 years        BP:           106/49 mmHg Patient Gender: M               HR:           97 bpm. Exam Location:  Inpatient Procedure: Limited Echo Indications:    Bacteremia R78.81  History:        Patient has prior history of  Echocardiogram examinations, most                 recent 07/12/2023. Risk Factors:Diabetes.  Sonographer:    Jayson Gaskins Referring Phys: [id number] OLADAPO ADEFESO IMPRESSIONS  1. Left ventricular ejection fraction, by estimation, is 55 to 60%. The left ventricle has normal function. The left ventricle has no regional wall motion abnormalities. Left ventricular diastolic parameters are indeterminate.  2. Right ventricular systolic function is normal. The right ventricular size is normal. Tricuspid regurgitation signal is inadequate for assessing PA pressure.  3. The aortic valve is tricuspid. There is mild calcification of the aortic valve.  4. The inferior vena cava is normal in size with greater than 50% respiratory variability, suggesting right atrial pressure of 3 mmHg.  5. Limited echo with no color doppler. Poor images, no valvular vegetation visualized but images limited. FINDINGS  Left Ventricle: Left ventricular ejection fraction, by estimation, is 55 to 60%. The left ventricle has normal function. The left ventricle has no regional wall motion abnormalities. The left ventricular internal cavity size was normal in size. There is  no left ventricular hypertrophy. Left ventricular diastolic parameters are indeterminate. Right Ventricle: The right ventricular size is normal. No increase in right ventricular wall thickness. Right ventricular systolic function is normal. Tricuspid regurgitation signal is inadequate for assessing PA pressure. Left Atrium: Left atrial size was normal in size. Right Atrium: Right atrial size was normal in size. Mitral Valve: There is mild calcification of the mitral valve leaflet(s). Tricuspid Valve: The tricuspid valve is normal in structure. Tricuspid valve regurgitation is not demonstrated. Aortic Valve: The aortic valve is tricuspid. There is mild calcification of the aortic valve. Venous: The inferior vena cava is normal in size with greater than 50% respiratory variability,  suggesting right atrial pressure of 3 mmHg. IAS/Shunts: No atrial level shunt detected by color flow Doppler. LEFT VENTRICLE PLAX 2D LVIDd:         4.90 cm LVIDs:         3.10 cm LV PW:         0.90 cm LV IVS:        1.00 cm  Dalton McleanMD Electronically signed by Ezra Kanner Signature Date/Time: 08/24/2023/5:08:15 PM    Final    DG HIP UNILAT WITH PELVIS 2-3 VIEWS RIGHT Result Date: 08/24/2023 CLINICAL DATA:  Right hip swelling and pain. EXAM: DG HIP (WITH OR WITHOUT PELVIS) 2-3V RIGHT COMPARISON:  MR right hip 08/18/2023, CT abdomen pelvis 06/23/2023. FINDINGS: Complete resorption of the right femoral head and neck with superior dislocation of  the right femoral shaft. Erosive changes in the right acetabulum. Findings are similar to 06/23/2023. Mild subchondral sclerosis and osteophytosis in the left hip. IMPRESSION: Complete resorption/destruction of the right femoral head and neck with involvement of the right acetabulum and superior displacement of the right femoral shaft. Findings are worrisome for osteomyelitis. Please correlate with recent MR right hip performed 08/18/2023. Electronically Signed   By: Newell Eke M.D.   On: 08/24/2023 11:37      Assessment/Plan:  INTERVAL HISTORY: Patient is status post orthopedic surgery   Principal Problem:   MRSA bacteremia Active Problems:   AKI (acute kidney injury) (HCC)   Hyponatremia   Dementia without behavioral disturbance (HCC)   Type 2 diabetes mellitus with hyperglycemia (HCC)   Acquired hypothyroidism   GERD (gastroesophageal reflux disease)   Mixed hyperlipidemia   Acute osteomyelitis involving pelvic region and thigh (HCC)   Septic arthritis of hip (HCC)   Myositis    [name] is a 68 y.o. male with dementia, nephronia who had MRSA bacteremia of unclear etiology May 2025 status post 4 weeks of daptomycin .  He had tensive bony destruction of his femur seen on CT of the abdomen pelvis on 17 May but it was not described  as being osteomyelitis and was described as being stable?  MRI more recently shown a large effusion with destruction of the femoral head with areas in the right acetabulum also concerning for osteomyelitis along with a large joint effusion that was complex.  And some myositis in the gluteus musculature.  His repeat blood cultures taken in our clinic have also come back positive for MRSA.  He has not been admitted and undergone orthopedic surgery.  #1 Recurrent MRSA bacteremia  Likely source of THIS bacteremia and even original one may have been hip though he also had wounds  Undergone I&D and arthrotomy of the right hip with some debridement and placement of vancomycin  tobramycin powder.  Continue daptomycin   Repeat blood cultures  2D echo does not no vegetations and would not pursue TEE  ? He will eventually have surgery to address his femur  #3 Goals of care: given his dementia, and severe infection and signficantly morbidity involving right femur, pelvis would strongly consider palliative care consult  #4 Contact precautions for MRSA.  I have personally spent 50 minutes involved in face-to-face and non-face-to-face activities for this patient on the day of the visit. Professional time spent includes the following activities: Preparing to see the patient (review of tests), Obtaining and/or reviewing separately obtained history (admission/discharge record), Performing a medically appropriate examination and/or evaluation , Ordering medications/tests/procedures, referring and communicating with other health care professionals, Documenting clinical information in the EMR, Independently interpreting results (not separately reported), Communicating results to the patient/family/caregiver, Counseling and educating the patient/family/caregiver and Care coordination (not separately reported).    Evaluation of the patient requires complex antimicrobial therapy evaluation, counseling ,  isolation needs to reduce disease transmission and risk assessment and mitigation.       LOS: 1 day   Jomarie Fleeta Rothman 08/25/2023, 1:50 PM

## 2023-08-25 NOTE — Transfer of Care (Signed)
 Immediate Anesthesia Transfer of Care Note  Patient: [name]  Procedure(s) Performed: INCISION AND DRAINAGE OF DEEP ABSCESS, HIP JOINT, ANTERIOR APPROACH (Right: Hip)  Patient Location: PACU  Anesthesia Type:General  Level of Consciousness: awake  Airway & Oxygen Therapy: Patient Spontanous Breathing and Patient connected to face mask oxygen  Post-op Assessment: Report given to RN and Post -op Vital signs reviewed and stable  Post vital signs: Reviewed and stable  Last Vitals:  Vitals Value Taken Time  BP 120/92 08/25/23 09:00  Temp 97.8   Pulse 105 08/25/23 09:01  Resp 19 08/25/23 09:01  SpO2 99 % 08/25/23 09:01  Vitals shown include unfiled device data.  Last Pain:  Vitals:   08/25/23 0626  TempSrc: (P) Oral  PainSc:          Complications: No notable events documented.

## 2023-08-25 NOTE — Anesthesia Procedure Notes (Signed)
 Procedure Name: LMA Insertion Date/Time: 08/25/2023 7:33 AM  Performed by: Atanacio Arland HERO, CRNAPre-anesthesia Checklist: Patient identified, Emergency Drugs available, Suction available and Patient being monitored Patient Re-evaluated:Patient Re-evaluated prior to induction Oxygen Delivery Method: Circle System Utilized Preoxygenation: Pre-oxygenation with 100% oxygen Induction Type: IV induction LMA: LMA inserted LMA Size: 4.0 Number of attempts: 1 Placement Confirmation: positive ETCO2 Tube secured with: Tape Dental Injury: Teeth and Oropharynx as per pre-operative assessment

## 2023-08-25 NOTE — Anesthesia Postprocedure Evaluation (Signed)
 Anesthesia Post Note  Patient: [name]  Procedure(s) Performed: INCISION AND DRAINAGE OF DEEP ABSCESS, HIP JOINT, ANTERIOR APPROACH (Right: Hip)     Patient location during evaluation: PACU Anesthesia Type: General Level of consciousness: awake and alert, oriented and patient cooperative Pain management: pain level controlled Vital Signs Assessment: post-procedure vital signs reviewed and stable Respiratory status: spontaneous breathing, nonlabored ventilation and respiratory function stable Cardiovascular status: blood pressure returned to baseline and stable Postop Assessment: no apparent nausea or vomiting Anesthetic complications: no   No notable events documented.  Last Vitals:  Vitals:   08/25/23 0930 08/25/23 0949  BP: 107/62 (!) 120/58  Pulse: (!) 103 100  Resp: 14 18  Temp:    SpO2: 94% 96%    Last Pain:  Vitals:   08/25/23 0949  TempSrc:   PainSc: 0-No pain                 Almarie CHRISTELLA Marchi

## 2023-08-25 NOTE — Progress Notes (Signed)
 [facility]                                                                              Traquan Duarte, is a 68 y.o. male, DOB - [birth year], FMW:[id number] Admit date - 08/24/2023    Outpatient Primary MD for the patient is [name], [name], MD  LOS - 1  days  No chief complaint on file.    Called to the ER by ID clinic with positive culture reports.  Brief summary   Patient is a 68 year old male with history of HTN, hypothyroidism, diabetes mellitus, presented to ED with abnormal labs. Patient was admitted from 5/17 to 5/23 due to MRSA bacteremia that was thought to be secondary to chronic indwelling suprapubic catheter which was treated with IV vancomycin . TTE done on 07/12/2023 showed no vegetations and patient refused TEE. He was discharged and treated with IV daptomycin  daily for about 4 weeks. PICC line was placed prior to discharge at that time.  Per EDP, patient's nephew received a phone call from patient's physician to go to [facility] immediately due to bacteremia, gram-positive cocci in blood.  In ED, vital signs stable, temp 97.4 F, pulse 102, normocytic anemia, sodium 129, creatinine 1.77.  Blood cultures positive for gram-positive cocci.  He was placed on IV vancomycin  then subsequently on IV daptomycin . Patient was found to have right hip abscess, brought to [facility] for incision debridement.   Assessment & Plan    GPC bacteremia.  Recurrent MRSA bacteremia. -Recent admission 5/17 to 5/23 with MRSA bacteremia, thought to be secondary to chronic indwelling suprapubic catheter, was treated with IV vancomycin  and completed 4 weeks of IV therapy at home. -2D echo 5/20 had shown EF> 75%, no vegetations, patient had refused TEE -Culture 6/30, with MRSA. -Repeat cultures 7/3, negative so far. -ID following.  Right hip MRI 6/26 as outpatient with severe destructive changes. I&D, debridement antibiotic placement and biopsy today Dr. Kendal.    Chronic hyponatremia -Na 128, baseline sodium 128-131 Continue gentle hydration   Acute kidney injury -Creatinine 1.77 (baseline creatinine at 1.1-1.2) -Continue gentle hydration   Type 2 diabetes mellitus with hyperglycemia -Hemoglobin A1c on 06/23/2023 was 7.0 -Continue sliding scale insulin  while inpatient    Acquired hypothyroidism Continue Synthroid    GERD Continue Protonix    Dementia Continue Aricept   Mixed hyperlipidemia Continue Crestor   Estimated body mass index is 26.32 kg/m (pended) as calculated from the following:   Height as of this encounter: (P) 5' 10 (1.778 m).   Weight as of this encounter: (P) 83.2 kg.  Code Status: Full code.  Consult palliative care team. DVT Prophylaxis:  SCDs Start: 08/25/23 0949 enoxaparin  (LOVENOX ) injection 40 mg Start: 08/24/23 1000 SCDs Start: 08/24/23 0621   Level of Care: Level of care: Med-Surg Family Communication: None today at the bedside. Disposition Plan:      Remains inpatient appropriate: per ID and orthopedics recommendations.  Transfer to [facility]   Procedures:  Right hip I&D.  Consultants:   Infectious disease, Dr. [facility]  Antimicrobials:   Anti-infectives (From admission, onward)    Start  Dose/Rate Route Frequency Ordered Stop   08/25/23 0824  tobramycin (NEBCIN) powder  Status:  Discontinued          As needed 08/25/23 0824 08/25/23 0855   08/25/23 0727  vancomycin  (VANCOCIN ) powder  Status:  Discontinued          As needed 08/25/23 0727 08/25/23 0855   08/25/23 0600  vancomycin  (VANCOREADY) IVPB 1250 mg/250 mL  Status:  Discontinued        1,250 mg 166.7 mL/hr over 90 Minutes Intravenous Every 24 hours 08/24/23 0625 08/24/23 1620   08/25/23 0600  ceFAZolin (ANCEF) IVPB 2g/100 mL premix        2 g 200 mL/hr over 30 Minutes Intravenous On call to O.R. 08/24/23 1725 08/25/23 0806   08/25/23 0600  vancomycin  (VANCOCIN ) IVPB 1000 mg/200 mL premix        1,000 mg 200  mL/hr over 60 Minutes Intravenous On call to O.R. 08/24/23 1725 08/25/23 0810   08/24/23 1730  DAPTOmycin  (CUBICIN ) IVPB 700 mg/169mL premix        8 mg/kg  83.2 kg 200 mL/hr over 30 Minutes Intravenous Daily 08/24/23 1632     08/24/23 0300  vancomycin  (VANCOREADY) IVPB 2000 mg/400 mL        2,000 mg 200 mL/hr over 120 Minutes Intravenous  Once 08/24/23 0258 08/24/23 0518          Medications  Chlorhexidine  Gluconate Cloth  6 each Topical Q0600   docusate sodium   100 mg Oral BID   donepezil  5 mg Oral Daily   enoxaparin  (LOVENOX ) injection  40 mg Subcutaneous Q24H   insulin  aspart  0-9 Units Subcutaneous TID WC   levothyroxine   75 mcg Oral Daily   mupirocin  ointment   Nasal BID   pantoprazole   40 mg Oral Daily   [START ON 08/26/2023] rosuvastatin   5 mg Oral Weekly      Subjective:   Patient was seen and examined.  Came back from procedure.  Poor historian but looks comfortable and denies any complaints.  He tells me that he walks around the house.   Objective:   Vitals:   08/25/23 0922 08/25/23 0930 08/25/23 0949 08/25/23 1158  BP: 103/60 107/62 (!) 120/58 (!) 111/58  Pulse: (!) 104 (!) 103 100 91  Resp: 13 14 18 18   Temp: 97.8 F (36.6 C)   97.8 F (36.6 C)  TempSrc:      SpO2: 94% 94% 96% 98%  Weight:      Height:        Intake/Output Summary (Last 24 hours) at 08/25/2023 1400 Last data filed at 08/25/2023 1200 Gross per 24 hour  Intake 1926.67 ml  Output 2750 ml  Net -823.33 ml     Wt Readings from Last 3 Encounters:  08/25/23 (P) 83.2 kg  08/23/23 90.7 kg  07/17/23 87 kg     General: Chronically sick looking.  Frail and debilitated older than stated age. Cardiovascular: S1-S2 normal.  Regular rate rhythm. Respiratory: Bilateral clear.  No added sounds. Gastrointestinal: Soft.  Nontender.  Bowel sound present. Suprapubic catheter present. Ext: No swelling or edema.  Patient has postop dressing right hip looks clean and dry. Neuro: Patient is alert  and awake.  He is oriented to himself and family.  Has slow speech but appropriately responding. Musculoskeletal: No deformities.    Data Reviewed:  I have personally reviewed following labs    CBC Lab Results  Component Value Date   WBC 11.6 (H)  08/25/2023   RBC 2.89 (L) 08/25/2023   HGB 7.6 (L) 08/25/2023   HCT 24.8 (L) 08/25/2023   MCV 85.8 08/25/2023   MCH 26.3 08/25/2023   PLT 408 (H) 08/25/2023   MCHC 30.6 08/25/2023   RDW 17.0 (H) 08/25/2023   LYMPHSABS 2.1 08/24/2023   MONOABS 0.6 08/24/2023   EOSABS 0.3 08/24/2023   BASOSABS 0.0 08/24/2023     Last metabolic panel Lab Results  Component Value Date   NA 128 (L) 08/25/2023   K 4.4 08/25/2023   CL 101 08/25/2023   CO2 19 (L) 08/25/2023   BUN 25 (H) 08/25/2023   CREATININE 1.69 (H) 08/25/2023   GLUCOSE 145 (H) 08/25/2023   GFRNONAA 44 (L) 08/25/2023   GFRAA >90 09/20/2012   CALCIUM  8.7 (L) 08/25/2023   PHOS 3.5 08/24/2023   PROT 7.6 08/25/2023   ALBUMIN  1.5 (L) 08/25/2023   BILITOT 0.9 08/25/2023   ALKPHOS 88 08/25/2023   AST 21 08/25/2023   ALT 20 08/25/2023   ANIONGAP 8 08/25/2023    CBG (last 3)  Recent Labs    08/25/23 0410 08/25/23 0629 08/25/23 1206  GLUCAP 144* 150* 185*      Coagulation Profile: No results for input(s): INR, PROTIME in the last 168 hours.   Radiology Studies: I have personally reviewed the imaging studies  DG HIP UNILAT WITH PELVIS 1V RIGHT Result Date: 08/25/2023 CLINICAL DATA:  Right hip debridement for septic joint EXAM: DG HIP (WITH OR WITHOUT PELVIS) 1V RIGHT COMPARISON:  Plain film from the previous day. FLUOROSCOPY TIME:  Radiation Exposure Index (as provided by the fluoroscopic device): 0.42 mGy If the device does not provide the exposure index: Fluoroscopy Time:  3 seconds Number of Acquired Images:  1 FINDINGS: Single spot film is obtained with a grasping device debriding the right acetabulum. IMPRESSION: Right hip joint debridement. Electronically Signed    By: Oneil Devonshire M.D.   On: 08/25/2023 10:23   DG C-Arm 1-60 Min-No Report Result Date: 08/25/2023 Fluoroscopy was utilized by the requesting physician.  No radiographic interpretation.   ECHOCARDIOGRAM LIMITED Result Date: 08/24/2023    ECHOCARDIOGRAM LIMITED REPORT   Patient Name:   [name] Date of Exam: 08/24/2023 Medical Rec #:  [id number]       Height:       70.0 in Accession #:    [id number]      Weight:       183.4 lb Date of Birth:  [birth year]       BSA:          2.012 m Patient Age:    68 years        BP:           106/49 mmHg Patient Gender: M               HR:           97 bpm. Exam Location:  Inpatient Procedure: Limited Echo Indications:    Bacteremia R78.81  History:        Patient has prior history of Echocardiogram examinations, most                 recent 07/12/2023. Risk Factors:Diabetes.  Sonographer:    Jayson Gaskins Referring Phys: [id number] OLADAPO ADEFESO IMPRESSIONS  1. Left ventricular ejection fraction, by estimation, is 55 to 60%. The left ventricle has normal function. The left ventricle has no regional wall motion abnormalities. Left ventricular diastolic parameters are indeterminate.  2. Right  ventricular systolic function is normal. The right ventricular size is normal. Tricuspid regurgitation signal is inadequate for assessing PA pressure.  3. The aortic valve is tricuspid. There is mild calcification of the aortic valve.  4. The inferior vena cava is normal in size with greater than 50% respiratory variability, suggesting right atrial pressure of 3 mmHg.  5. Limited echo with no color doppler. Poor images, no valvular vegetation visualized but images limited. FINDINGS  Left Ventricle: Left ventricular ejection fraction, by estimation, is 55 to 60%. The left ventricle has normal function. The left ventricle has no regional wall motion abnormalities. The left ventricular internal cavity size was normal in size. There is  no left ventricular hypertrophy. Left ventricular diastolic  parameters are indeterminate. Right Ventricle: The right ventricular size is normal. No increase in right ventricular wall thickness. Right ventricular systolic function is normal. Tricuspid regurgitation signal is inadequate for assessing PA pressure. Left Atrium: Left atrial size was normal in size. Right Atrium: Right atrial size was normal in size. Mitral Valve: There is mild calcification of the mitral valve leaflet(s). Tricuspid Valve: The tricuspid valve is normal in structure. Tricuspid valve regurgitation is not demonstrated. Aortic Valve: The aortic valve is tricuspid. There is mild calcification of the aortic valve. Venous: The inferior vena cava is normal in size with greater than 50% respiratory variability, suggesting right atrial pressure of 3 mmHg. IAS/Shunts: No atrial level shunt detected by color flow Doppler. LEFT VENTRICLE PLAX 2D LVIDd:         4.90 cm LVIDs:         3.10 cm LV PW:         0.90 cm LV IVS:        1.00 cm  Dalton McleanMD Electronically signed by Ezra Kanner Signature Date/Time: 08/24/2023/5:08:15 PM    Final    DG HIP UNILAT WITH PELVIS 2-3 VIEWS RIGHT Result Date: 08/24/2023 CLINICAL DATA:  Right hip swelling and pain. EXAM: DG HIP (WITH OR WITHOUT PELVIS) 2-3V RIGHT COMPARISON:  MR right hip 08/18/2023, CT abdomen pelvis 06/23/2023. FINDINGS: Complete resorption of the right femoral head and neck with superior dislocation of the right femoral shaft. Erosive changes in the right acetabulum. Findings are similar to 06/23/2023. Mild subchondral sclerosis and osteophytosis in the left hip. IMPRESSION: Complete resorption/destruction of the right femoral head and neck with involvement of the right acetabulum and superior displacement of the right femoral shaft. Findings are worrisome for osteomyelitis. Please correlate with recent MR right hip performed 08/18/2023. Electronically Signed   By: Newell Eke M.D.   On: 08/24/2023 11:37       Renato Applebaum M.D. [facility] 08/25/2023, 2:00 PM  Total time spent: 52 minutes.

## 2023-08-25 NOTE — Op Note (Signed)
 Orthopaedic Surgery Operative Note (CSN: [id number] ) Date of Surgery: 08/25/2023  Admit Date: 08/24/2023   Diagnoses: Pre-Op Diagnoses: Right septic hip with recurrent bacteremia  Post-Op Diagnosis: Same  Procedures: CPT 27030-Arthrotomy/I&D of right hip for septic arthritis  Surgeons : Primary: Kendal Franky SQUIBB, MD  Assistant: Lauraine Moores, PA-C  Location: OR 3   Anesthesia: General   Antibiotics: Ancef 2g preop with 1gm vancomycin  powder and 1.2 gm tobramycin powder placed topically   Tourniquet time:None    Estimated Blood Loss: 250 mL  Complications:* No complications entered in OR log *   Specimens: ID Type Source Tests Collected by Time Destination  A : Right hip abscess 1 Tissue PATH Soft tissue AEROBIC/ANAEROBIC CULTURE W GRAM STAIN (SURGICAL/DEEP WOUND) Kendal Franky SQUIBB, MD 08/25/2023 0804   B : Right hip abscess 2 Tissue PATH Soft tissue AEROBIC/ANAEROBIC CULTURE W GRAM STAIN (SURGICAL/DEEP WOUND) Kendal Franky SQUIBB, MD 08/25/2023 [phone]      Implants: * No implants in log *   Indications for Surgery: 68 year old male with a history of dementia who has had a 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  He has been recurrently bacteremic and due to persistent erosive changes in his hip it was felt that a potential source was his hip.  We discussed risks and benefits of I&D with his family.  Risks included but not limited to bleeding, persistent infection, need for further surgery, pain, inability to ambulate.  They agreed to proceed with surgery and consent was obtained.  Operative Findings: 1.  Incision and drainage of right hip septic arthritis with destructive lesion of the acetabulum and femoral head.  Intraoperative findings just so phlegmonous material without gross purulence.  Tissue was very hypervascular.  Procedure: The patient was identified in the preoperative holding area. Consent was confirmed with the patient and their family and all questions were answered.  The operative extremity was marked after confirmation with the patient. he was then brought back to the operating room by our anesthesia colleagues.  He was placed under general anesthetic and carefully transferred over to the [facility] table.  Traction was applied to the right hip.  The hip was then prepped and draped in usual sterile fashion.  A timeout was performed to verify the patient, the procedure, and the extremity.  Preoperative antibiotics were dosed.  A standard anterior approach to the hip was made and carried down through skin subcutaneous tissue.  Identified the ASIS and the TFL fascia.  Incised the TFL fascia in line with my incision and then entered the interval between that and the sartorius.  Here I encountered some crossing vessels that were very hypervascular.  I cauterized those and went down to the hip capsule.  I made a capsulotomy/arthrotomy and entered the pseudocapsule of the destructive lesion of the hip.  There was some phlegmonous material in the area that I collected and sent for culture.  I debrided the area using rongeurs as well as a Cobb elevator and was able to access the acetabulum.  I confirmed with fluoroscopy that I was within the previous acetabulum.  However with how much height of vascularity was around the soft tissues I felt that extensive debridement would have caused the significant blood loss.  I also felt that this was likely chronic in nature.  I did get good bleeding tissue into the pseudocapsule of his hip.  I then used low-pressure pulsatile lavage to thoroughly irrigate the hip with 3 L of normal saline.  I then  proceeded to place a gram of vancomycin  powder and 1.2 g of tobramycin powder into the hip joint.  The capsule was approximated using #1 Vicryl suture.  The TFL fascia was closed with #1 Vicryl suture.  Skin was closed with Monocryl and Dermabond.  Sterile dressing was applied.  The patient was then awoke from anesthesia and taken to the PACU in stable  condition.   Debridement type: Excisional Debridement  Side: right  Body Location: Hip   Tools used for debridement: curette and rongeur  Pre-debridement Wound size (cm):   N/A  Post-debridement Wound size (cm):   N/A  Debridement depth beyond dead/damaged tissue down to healthy viable tissue: yes  Tissue layer involved: skin, subcutaneous tissue, muscle / fascia  Nature of tissue removed: Necrotic and Non-viable tissue  Irrigation volume: 3L     Irrigation fluid type: Normal Saline   Post Op Plan/Instructions: Patient be weightbearing as tolerated to the right lower extremity.  He will receive antibiotics at the discretion of infectious disease.  Will have him mobilize with physical and Occupational Therapy.  DVT prophylaxis will be at the discretion of the primary team I am okay with 81 mg of aspirin.  I was present and performed the entire surgery.  Lauraine Moores, PA-C did assist me throughout the case. An assistant was necessary given the difficulty in approach, maintenance of reduction and ability to instrument the fracture.   Franky Light, MD [facility]

## 2023-08-25 NOTE — Progress Notes (Signed)
 Pharmacy Antibiotic Note  [name] is a 68 y.o. male admitted on 08/24/2023 with MRSA bacteremia and right septic hip. Patient in OR today for I and D. Pharmacy has been consulted for Daptomycin  dosing.  Scr trended down today to 1.69(baseline ~1.2). Baseline CK today 18.  Plan: Continue Daptomycin  700 mg q24h  Weekly CK on Thursdays Will monitor surgery, blood cultures and send for daptomycin  sensitivity if MRSA isolated here Pharmacy will sign-off and monitor peripherally  Height: (P) 5' 10 (177.8 cm) Weight: (P) 83.2 kg (183 lb 6.8 oz) IBW/kg (Calculated) : (P) 73  Temp (24hrs), Avg:98.3 F (36.8 C), Min:97.9 F (36.6 C), Max:98.9 F (37.2 C)  Recent Labs  Lab 08/22/23 1520 08/24/23 0246 08/25/23 0433  WBC 11.3* 10.1 11.6*  CREATININE 1.77* 1.77* 1.69*    Estimated Creatinine Clearance: 43.8 mL/min (A) (by C-G formula based on SCr of 1.69 mg/dL (H)).    No Known Allergies  Thank you for allowing pharmacy to be a part of this patient's care.  Damien Quiet, PharmD, BCPS, BCIDP Infectious Diseases Clinical Pharmacist Phone: [phone] 08/25/2023 8:14 AM

## 2023-08-26 ENCOUNTER — Other Ambulatory Visit: Payer: Self-pay | Admitting: Internal Medicine

## 2023-08-26 ENCOUNTER — Encounter (HOSPITAL_COMMUNITY): Payer: Self-pay | Admitting: Student

## 2023-08-26 DIAGNOSIS — Z515 Encounter for palliative care: Secondary | ICD-10-CM

## 2023-08-26 DIAGNOSIS — Z7189 Other specified counseling: Secondary | ICD-10-CM

## 2023-08-26 DIAGNOSIS — M86151 Other acute osteomyelitis, right femur: Secondary | ICD-10-CM | POA: Diagnosis not present

## 2023-08-26 DIAGNOSIS — B9562 Methicillin resistant Staphylococcus aureus infection as the cause of diseases classified elsewhere: Secondary | ICD-10-CM | POA: Diagnosis not present

## 2023-08-26 DIAGNOSIS — M00051 Staphylococcal arthritis, right hip: Secondary | ICD-10-CM | POA: Diagnosis not present

## 2023-08-26 DIAGNOSIS — R7881 Bacteremia: Secondary | ICD-10-CM | POA: Diagnosis not present

## 2023-08-26 LAB — CULTURE, BLOOD (SINGLE)
MICRO NUMBER:: 16642551
SPECIMEN QUALITY:: ADEQUATE

## 2023-08-26 LAB — GLUCOSE, CAPILLARY
Glucose-Capillary: 179 mg/dL — ABNORMAL HIGH (ref 70–99)
Glucose-Capillary: 214 mg/dL — ABNORMAL HIGH (ref 70–99)
Glucose-Capillary: 171 mg/dL — ABNORMAL HIGH (ref 70–99)
Glucose-Capillary: 196 mg/dL — ABNORMAL HIGH (ref 70–99)

## 2023-08-26 LAB — CBC
HCT: 25.4 % — ABNORMAL LOW (ref 39.0–52.0)
Hemoglobin: 7.9 g/dL — ABNORMAL LOW (ref 13.0–17.0)
MCH: 26.3 pg (ref 26.0–34.0)
MCHC: 31.1 g/dL (ref 30.0–36.0)
MCV: 84.7 fL (ref 80.0–100.0)
Platelets: 407 K/uL — ABNORMAL HIGH (ref 150–400)
RBC: 3 MIL/uL — ABNORMAL LOW (ref 4.22–5.81)
RDW: 17.9 % — ABNORMAL HIGH (ref 11.5–15.5)
WBC: 9.6 K/uL (ref 4.0–10.5)
nRBC: 0 % (ref 0.0–0.2)

## 2023-08-26 LAB — BASIC METABOLIC PANEL WITH GFR
Anion gap: 7 (ref 5–15)
BUN: 26 mg/dL — ABNORMAL HIGH (ref 8–23)
CO2: 21 mmol/L — ABNORMAL LOW (ref 22–32)
Calcium: 8.6 mg/dL — ABNORMAL LOW (ref 8.9–10.3)
Chloride: 105 mmol/L (ref 98–111)
Creatinine, Ser: 1.72 mg/dL — ABNORMAL HIGH (ref 0.61–1.24)
GFR, Estimated: 43 mL/min — ABNORMAL LOW (ref >60)
Glucose, Bld: 153 mg/dL — ABNORMAL HIGH (ref 70–99)
Potassium: 4.5 mmol/L (ref 3.5–5.1)
Sodium: 133 mmol/L — ABNORMAL LOW (ref 135–145)

## 2023-08-26 LAB — C-REACTIVE PROTEIN: CRP: 19.5 mg/dL — ABNORMAL HIGH (ref <1.0)

## 2023-08-26 MED ORDER — ASPIRIN 81 MG PO TBEC: 81.0000 mg | DELAYED_RELEASE_TABLET | Freq: Two times a day (BID) | ORAL | Status: AC

## 2023-08-26 MED ORDER — HYDROCODONE-ACETAMINOPHEN 5-325 MG PO TABS: 1.0000 | ORAL_TABLET | Freq: Four times a day (QID) | ORAL | 0 refills | Status: AC | PRN

## 2023-08-26 NOTE — Progress Notes (Addendum)
     1 Day Post-Op Procedure(s) (LRB): INCISION AND DRAINAGE OF DEEP ABSCESS, HIP JOINT, ANTERIOR APPROACH (Right) Subjective:  Patient reports pain as mild to moderate.  Slept okay.  Mobilize with PT today.  No reports of chest pain, ShOB, N/V.  Denies numbness or tingling.  No other complaints at this time.  Dementia at baseline.  Objective:   VITALS:   Vitals:   08/25/23 1652 08/25/23 2131 08/26/23 0448 08/26/23 0835  BP: (!) 96/54 128/63 (!) 125/59 133/62  Pulse: 88 96 99 (!) 112  Resp: 18 16 18 18   Temp: 98.5 F (36.9 C) 97.6 F (36.4 C) 98.4 F (36.9 C) 98.3 F (36.8 C)  TempSrc:  Oral Oral Oral  SpO2: 99% 99% 99% 97%  Weight:      Height:        Alert, sitting comfortably, in NAD Neurologically intact Sensation intact distally Intact pulses distally Dorsiflexion/Plantar flexion intact Incision: dressing C/D/I Wiggles toes appropriately   Lab Results  Component Value Date   WBC 9.6 08/26/2023   HGB 7.9 (L) 08/26/2023   HCT 25.4 (L) 08/26/2023   MCV 84.7 08/26/2023   PLT 407 (H) 08/26/2023   BMET    Component Value Date/Time   NA 133 (L) 08/26/2023 0413   K 4.5 08/26/2023 0413   CL 105 08/26/2023 0413   CO2 21 (L) 08/26/2023 0413   GLUCOSE 153 (H) 08/26/2023 0413   BUN 26 (H) 08/26/2023 0413   CREATININE 1.72 (H) 08/26/2023 0413   CREATININE 1.77 (H) 08/22/2023 1520   CALCIUM  8.6 (L) 08/26/2023 0413   GFRNONAA 43 (L) 08/26/2023 0413     Xray: None  Assessment/Plan: 1 Day Post-Op   Principal Problem:   MRSA bacteremia Active Problems:   AKI (acute kidney injury) (HCC)   Hyponatremia   Dementia without behavioral disturbance (HCC)   Type 2 diabetes mellitus with hyperglycemia (HCC)   Acquired hypothyroidism   GERD (gastroesophageal reflux disease)   Mixed hyperlipidemia   Acute osteomyelitis involving pelvic region and thigh (HCC)   Septic arthritis of hip (HCC)   Myositis   Procedure(s) (LRB): INCISION AND DRAINAGE OF DEEP  ABSCESS, HIP JOINT, ANTERIOR APPROACH (Right)   Narrative: 08/26/23: Hgb 7.9, previous of 7.6, 7.7, 8.2 -- appears stable.  Infectious disease managing antibiotic therapy.  Mobilize with PT today.   Plan: Up with therapy Incentive Spirometry Elevate and Apply ice  Weightbearing: WBAT ROM/Precautions:  no formal precautions Mobility: Continue with PT/OT Insicional and dressing care: Reinforce dressings PRN Pain management: multimodal Antibiotics: Periop abx, see infectious disease note Diet: Advance as tolerated VTE prophylaxis: 81mg  ASA daily x 4 weeks Dispo: per medicine Follow - up plan: follow up in office with Dr. Kendal outpatient as discussed    [name] 08/26/2023, 9:01 AM   Contact information:   [id number] 7am-5pm epic message Dr. Edna, or call office for patient follow up: [phone] After hours and holidays please check [email or web address] for group call information for [facility]

## 2023-08-26 NOTE — Plan of Care (Signed)

## 2023-08-26 NOTE — Progress Notes (Signed)
 [facility]                                                                              [name], is a 68 y.o. male, DOB - 1955/04/09, FMW:[id number] Admit date - 08/24/2023    Outpatient Primary MD for the patient is [name], MD  LOS - 2  days  No chief complaint on file.    Called to the ER by ID clinic with positive culture reports.  Brief summary  Patient is a 68 year old male with history of HTN, hypothyroidism, diabetes mellitus, presented to ED with abnormal labs. Patient was admitted from 5/17 to 5/23 due to MRSA bacteremia that was thought to be secondary to chronic indwelling suprapubic catheter which was treated with IV vancomycin . TTE done on 07/12/2023 showed no vegetations and patient refused TEE. He was discharged and treated with IV daptomycin  daily for about 4 weeks. PICC line was placed prior to discharge at that time.  Per EDP, patient's nephew received a phone call from patient's physician to go to [facility] immediately due to bacteremia, gram-positive cocci in blood.  In ED, vital signs stable, temp 97.4 F, pulse 102, normocytic anemia, sodium 129, creatinine 1.77.  Blood cultures positive for gram-positive cocci.  He was placed on IV vancomycin  then subsequently on IV daptomycin . Patient was found to have right hip abscess, brought to [facility] for incision debridement.  08/26/2023: Patient seen.  No new complaints.  Patient underwent arthrotomy/I&D of right hip for septic arthritis yesterday, 08/25/2023.  Patient is currently on IV daptomycin .   Assessment & Plan    GPC bacteremia.  Recurrent MRSA bacteremia. -Recent admission 5/17 to 5/23 with MRSA bacteremia, thought to be secondary to chronic indwelling suprapubic catheter, was treated with IV vancomycin  and completed 4 weeks of IV therapy at home. -2D echo 5/20 had shown EF> 75%, no vegetations, patient had refused TEE -Culture 6/30, with MRSA. -Repeat cultures 7/3,  negative so far. -ID following.  Right hip MRI 6/26 as outpatient with severe destructive changes. I&D, debridement antibiotic placement and biopsy today Dr. Kendal. 08/26/2023: Status post I&D of right hip abscess on 08/25/2023.  Continue IV daptomycin .   Chronic hyponatremia - Likely multifactorial. - Sodium is improving.  Sodium of 133 noted today.   Acute kidney injury - Serum creatinine of 1.72 today. - Baseline serum creatinine of 1.20. - Current AKI is likely related to the bacteremia.  Cannot entirely rule out volume depletion.   Type 2 diabetes mellitus with hyperglycemia -Hemoglobin A1c on 06/23/2023 was 7.0 -Continue sliding scale insulin  while inpatient    Acquired hypothyroidism Continue Synthroid    GERD Continue Protonix    Dementia Continue Aricept    Mixed hyperlipidemia Continue Crestor   Estimated body mass index is 26.32 kg/m (pended) as calculated from the following:   Height as of this encounter: (P) 5' 10 (1.778 m).   Weight as of this encounter: (P) 83.2 kg.  Code Status: Full code.  Consult palliative care team. DVT Prophylaxis:  SCDs Start: 08/25/23 0949 enoxaparin  (LOVENOX ) injection 40 mg Start: 08/24/23 1000 SCDs Start: 08/24/23 0621   Level of Care:  Level of care: Med-Surg Family Communication: None today at the bedside. Disposition Plan:      Remains inpatient appropriate: per ID and orthopedics recommendations.  Transfer to [facility].   Procedures:  Right hip I&D.  Consultants:   Infectious disease, Dr. [facility]  Antimicrobials:   Anti-infectives (From admission, onward)    Start     Dose/Rate Route Frequency Ordered Stop   08/25/23 0824  tobramycin  (NEBCIN ) powder  Status:  Discontinued          As needed 08/25/23 0824 08/25/23 0855   08/25/23 0727  vancomycin  (VANCOCIN ) powder  Status:  Discontinued          As needed 08/25/23 0727 08/25/23 0855   08/25/23 0600  vancomycin  (VANCOREADY) IVPB 1250 mg/250 mL   Status:  Discontinued        1,250 mg 166.7 mL/hr over 90 Minutes Intravenous Every 24 hours 08/24/23 0625 08/24/23 1620   08/25/23 0600  ceFAZolin  (ANCEF ) IVPB 2g/100 mL premix        2 g 200 mL/hr over 30 Minutes Intravenous On call to O.R. 08/24/23 1725 08/25/23 0806   08/25/23 0600  vancomycin  (VANCOCIN ) IVPB 1000 mg/200 mL premix        1,000 mg 200 mL/hr over 60 Minutes Intravenous On call to O.R. 08/24/23 1725 08/25/23 0810   08/24/23 1730  DAPTOmycin  (CUBICIN ) IVPB 700 mg/129mL premix        8 mg/kg  83.2 kg 200 mL/hr over 30 Minutes Intravenous Daily 08/24/23 1632     08/24/23 0300  vancomycin  (VANCOREADY) IVPB 2000 mg/400 mL        2,000 mg 200 mL/hr over 120 Minutes Intravenous  Once 08/24/23 0258 08/24/23 0518          Medications  Chlorhexidine  Gluconate Cloth  6 each Topical Q0600   docusate sodium   100 mg Oral BID   donepezil   5 mg Oral Daily   enoxaparin  (LOVENOX ) injection  40 mg Subcutaneous Q24H   insulin  aspart  0-9 Units Subcutaneous TID WC   levothyroxine   75 mcg Oral Daily   mupirocin  ointment   Nasal BID   pantoprazole   40 mg Oral Daily   rosuvastatin   5 mg Oral Weekly      Subjective:   Patient seen. Poor historian.     Objective:   Vitals:   08/26/23 0448 08/26/23 0835 08/26/23 0934 08/26/23 1558  BP: (!) 125/59 133/62 137/81 122/62  Pulse: 99 (!) 112 96 (!) 103  Resp: 18 18 16 18   Temp: 98.4 F (36.9 C) 98.3 F (36.8 C) 98.6 F (37 C) 98.5 F (36.9 C)  TempSrc: Oral Oral Oral Oral  SpO2: 99% 97% 98% 98%  Weight:      Height:        Intake/Output Summary (Last 24 hours) at 08/26/2023 1735 Last data filed at 08/26/2023 1251 Gross per 24 hour  Intake 640 ml  Output 2040 ml  Net -1400 ml     Wt Readings from Last 3 Encounters:  08/25/23 (P) 83.2 kg  08/23/23 90.7 kg  07/17/23 87 kg     General: Chronically ill looking.    Cardiovascular: S1-S2 normal.  . Respiratory: Clear to auscultation. Gastrointestinal: Soft.   Nontender.   Suprapubic catheter present. Ext: No swelling or edema.  Patient has postop dressing right hip looks clean and dry. Neuro: Patient is awake and alert.    Data Reviewed:  I have personally reviewed following labs  CBC Lab Results  Component Value Date   WBC 9.6 08/26/2023   RBC 3.00 (L) 08/26/2023   HGB 7.9 (L) 08/26/2023   HCT 25.4 (L) 08/26/2023   MCV 84.7 08/26/2023   MCH 26.3 08/26/2023   PLT 407 (H) 08/26/2023   MCHC 31.1 08/26/2023   RDW 17.9 (H) 08/26/2023   LYMPHSABS 2.1 08/24/2023   MONOABS 0.6 08/24/2023   EOSABS 0.3 08/24/2023   BASOSABS 0.0 08/24/2023     Last metabolic panel Lab Results  Component Value Date   NA 133 (L) 08/26/2023   K 4.5 08/26/2023   CL 105 08/26/2023   CO2 21 (L) 08/26/2023   BUN 26 (H) 08/26/2023   CREATININE 1.72 (H) 08/26/2023   GLUCOSE 153 (H) 08/26/2023   GFRNONAA 43 (L) 08/26/2023   GFRAA >90 09/20/2012   CALCIUM  8.6 (L) 08/26/2023   PHOS 3.5 08/24/2023   PROT 7.6 08/25/2023   ALBUMIN  1.5 (L) 08/25/2023   BILITOT 0.9 08/25/2023   ALKPHOS 88 08/25/2023   AST 21 08/25/2023   ALT 20 08/25/2023   ANIONGAP 7 08/26/2023    CBG (last 3)  Recent Labs    08/26/23 0825 08/26/23 1141 08/26/23 1557  GLUCAP 179* 214* 171*      Coagulation Profile: No results for input(s): INR, PROTIME in the last 168 hours.   Radiology Studies: I have personally reviewed the imaging studies  DG HIP UNILAT WITH PELVIS 1V RIGHT Result Date: 08/25/2023 CLINICAL DATA:  Right hip debridement for septic joint EXAM: DG HIP (WITH OR WITHOUT PELVIS) 1V RIGHT COMPARISON:  Plain film from the previous day. FLUOROSCOPY TIME:  Radiation Exposure Index (as provided by the fluoroscopic device): 0.42 mGy If the device does not provide the exposure index: Fluoroscopy Time:  3 seconds Number of Acquired Images:  1 FINDINGS: Single spot film is obtained with a grasping device debriding the right acetabulum. IMPRESSION: Right hip joint  debridement. Electronically Signed   By: Oneil Devonshire M.D.   On: 08/25/2023 10:23   DG C-Arm 1-60 Min-No Report Result Date: 08/25/2023 Fluoroscopy was utilized by the requesting physician.  No radiographic interpretation.       Leatrice LILLETTE Chapel M.D. [facility] 08/26/2023, 5:35 PM  Total time spent: 52 minutes.

## 2023-08-26 NOTE — Evaluation (Signed)
 Physical Therapy Evaluation Patient Details Name: [name] MRN: [id number] DOB: Jan 16, 1956 Today's Date: 08/26/2023  History of Present Illness  Patient is 68 y.o. male presented to ED with abnormal labs on 08/24/23. Per EDP, patient's physician instructed pt to go to [facility] immediately due to bacteremia, gram-positive cocci in blood. Pt recently admitted from 5/17 to 5/23 due to MRSA bacteremia thought to be secondary to chronic indwelling suprapubic catheter, treated with IV vancomycin . TTE done on 07/12/2023 showed no vegetations and patient refused TEE. Pt discharged with IV daptomycin  daily for about 4 weeks via PICC. Right hip MRI 6/26 as outpatient with severe destructive changes. Pt now s/p arthrotomy I&D of Rt hip for deep abscess and septic arthritis. PMH significant for dementia, schizophrenia,   Clinical Impression  [name] is 68 y.o. male admitted with above HPI and diagnosis. Patient is currently limited by functional impairments below (see PT problem list). Patient unable to provide history/home/PLOF. Per chart review pt lives with friends/family and is was mobilizing with RW and assist for all ADL's at baseline. Pt currently requires Min assist for bed mobility and transfers with RW. Pt limited by pain and weakness in Rt LE. Patient will benefit from continued skilled PT interventions to address impairments and progress independence with mobility. Patient will benefit from continued inpatient follow up therapy, <3 hours/day. Acute PT will follow and progress as able.         If plan is discharge home, recommend the following: Two people to help with walking and/or transfers;A lot of help with bathing/dressing/bathroom;Assistance with cooking/housework;Direct supervision/assist for medications management;Assist for transportation;Help with stairs or ramp for entrance;Supervision due to cognitive status   Can travel by private vehicle   No    Equipment Recommendations None  recommended by PT  Recommendations for Other Services       Functional Status Assessment Patient has had a recent decline in their functional status and demonstrates the ability to make significant improvements in function in a reasonable and predictable amount of time.     Precautions / Restrictions Precautions Precautions: Fall Recall of Precautions/Restrictions: Intact Restrictions Weight Bearing Restrictions Per Provider Order: No      Mobility  Bed Mobility Overal bed mobility: Needs Assistance Bed Mobility: Supine to Sit     Supine to sit: Min assist, Used rails, HOB elevated     General bed mobility comments: slow initiation and step by step cues provided for supine>sit EOB. Min assist to raise trunk and pivot hips with bed pad.    Transfers Overall transfer level: Needs assistance Equipment used: Rolling walker (2 wheels) Transfers: Sit to/from Stand, Bed to chair/wheelchair/BSC Sit to Stand: Min assist   Step pivot transfers: Min assist       General transfer comment: cues for hand placement with power up. min assist for rise and guide walker during turn. pt scooting feet rather than lifting to step and greater difficulty with advancing Rt LE.    Ambulation/Gait                  Stairs            Wheelchair Mobility     Tilt Bed    Modified Rankin (Stroke Patients Only)       Balance  Pertinent Vitals/Pain Pain Assessment Pain Assessment: PAINAD Breathing: normal Negative Vocalization: none Facial Expression: facial grimacing (with weight bearing) Body Language: relaxed Consolability: no need to console PAINAD Score: 2 Pain Intervention(s): Limited activity within patient's tolerance, Monitored during session, Repositioned    Home Living Family/patient expects to be discharged to:: Private residence Living Arrangements: Other relatives Available Help at  Discharge: Family;Other (Comment) (sisters) Type of Home: House Home Access: Stairs to enter Entrance Stairs-Rails: Left;Right;Can reach both Entrance Stairs-Number of Steps: 12   Home Layout: One level Home Equipment: Agricultural consultant (2 wheels);Cane - single point;Shower seat;Grab bars - tub/shower;Grab bars - toilet Additional Comments: pt unreliable historian, home set up gathered from prior chart entry    Prior Function Prior Level of Function : Needs assist       Physical Assist : ADLs (physical);Mobility (physical) Mobility (physical): Bed mobility;Transfers;Gait;Stairs ADLs (physical): Toileting;IADLs;Bathing Mobility Comments: Household ambulation using RW ADLs Comments: Reports quite a bit of assist for bathing, toileting, and IADL's.     Extremity/Trunk Assessment   Upper Extremity Assessment Upper Extremity Assessment: Overall WFL for tasks assessed    Lower Extremity Assessment Lower Extremity Assessment: Defer to PT evaluation    Cervical / Trunk Assessment Cervical / Trunk Assessment: Kyphotic  Communication   Communication Communication: No apparent difficulties Factors Affecting Communication: Hearing impaired    Cognition Arousal: Alert Behavior During Therapy: Flat affect   PT - Cognitive impairments: History of cognitive impairments, No family/caregiver present to determine baseline                         Following commands: Intact       Cueing Cueing Techniques: Verbal cues     General Comments      Exercises     Assessment/Plan    PT Assessment Patient needs continued PT services  PT Problem List Decreased strength;Decreased range of motion;Decreased activity tolerance;Decreased balance;Decreased mobility;Decreased coordination;Decreased cognition;Decreased knowledge of use of DME;Decreased safety awareness;Decreased knowledge of precautions;Pain       PT Treatment Interventions DME instruction;Patient/family  education;Cognitive remediation;Neuromuscular re-education;Balance training;Therapeutic exercise;Therapeutic activities;Functional mobility training;Stair training;Gait training;Wheelchair mobility training    PT Goals (Current goals can be found in the Care Plan section)  Acute Rehab PT Goals Patient Stated Goal: none stated PT Goal Formulation: Patient unable to participate in goal setting Time For Goal Achievement: 09/09/23 Potential to Achieve Goals: Good    Frequency Min 2X/week     Co-evaluation               AM-PAC PT 6 Clicks Mobility  Outcome Measure Help needed turning from your back to your side while in a flat bed without using bedrails?: A Little Help needed moving from lying on your back to sitting on the side of a flat bed without using bedrails?: A Little Help needed moving to and from a bed to a chair (including a wheelchair)?: A Little Help needed standing up from a chair using your arms (e.g., wheelchair or bedside chair)?: A Little Help needed to walk in hospital room?: Total Help needed climbing 3-5 steps with a railing? : Total 6 Click Score: 14    End of Session Equipment Utilized During Treatment: Gait belt Activity Tolerance: Patient tolerated treatment well Patient left: in chair;with call bell/phone within reach;with chair alarm set Nurse Communication: Mobility status PT Visit Diagnosis: Muscle weakness (generalized) (M62.81);Difficulty in walking, not elsewhere classified (R26.2);Other symptoms and signs involving the nervous system (R29.898);Unsteadiness on feet (R26.81);Pain;Other  abnormalities of gait and mobility (R26.89);History of falling (Z91.81) Pain - Right/Left: Right Pain - part of body: Hip    Time: 8891-8866 PT Time Calculation (min) (ACUTE ONLY): 25 min   Charges:   PT Evaluation $PT Eval Moderate Complexity: 1 Mod PT Treatments $Therapeutic Activity: 8-22 mins PT General Charges $$ ACUTE PT VISIT: 1 Visit          Vernell DONEEN KLEIN, DPT Acute Rehabilitation Services Office [phone]  08/26/23 1:49 PM

## 2023-08-26 NOTE — Evaluation (Addendum)
 Occupational Therapy Evaluation Patient Details Name: [name] MRN: [id number] DOB: [birth year] Today's Date: 08/26/2023   History of Present Illness   Patient is 68 y.o. male presented to ED with abnormal labs on 08/24/23. Per EDP, patient's physician instructed pt to go to [facility] immediately due to bacteremia, gram-positive cocci in blood. Pt recently admitted from 5/17 to 5/23 due to MRSA bacteremia thought to be secondary to chronic indwelling suprapubic catheter, treated with IV vancomycin . TTE done on 07/12/2023 showed no vegetations and patient refused TEE. Pt discharged with IV daptomycin  daily for about 4 weeks via PICC. Right hip MRI 6/26 as outpatient with severe destructive changes. Pt now s/p arthrotomy I&D of Rt hip for deep abscess and septic arthritis. PMH significant for dementia, schizophrenia,     Clinical Impressions Pt walks with a RW and receives quite a bit of assistance from his family for ADLs and IADLs. Pt is poor historian. He reports he lives with his sister and other family members. Pt presents with minimal R hip pain. He is able to stand with RW and transfer with min assist. He requires set up to total assist for ADLs. Patient will benefit from continued inpatient follow up therapy, <3 hours/day unless family is able to manage pt at current functional level.     If plan is discharge home, recommend the following:   A little help with walking and/or transfers;A lot of help with bathing/dressing/bathroom;Assistance with cooking/housework;Direct supervision/assist for medications management;Direct supervision/assist for financial management;Assist for transportation;Help with stairs or ramp for entrance     Functional Status Assessment   Patient has had a recent decline in their functional status and demonstrates the ability to make significant improvements in function in a reasonable and predictable amount of time.     Equipment Recommendations   Other  (comment) (TBD)     Recommendations for Other Services         Precautions/Restrictions   Precautions Precautions: Fall Recall of Precautions/Restrictions: Intact Restrictions Weight Bearing Restrictions Per Provider Order: No     Mobility Bed Mobility Overal bed mobility: Needs Assistance Bed Mobility: Sit to Supine       Sit to supine: Min assist   General bed mobility comments: assist for R LE back into bed    Transfers Overall transfer level: Needs assistance Equipment used: Rolling walker (2 wheels) Transfers: Sit to/from Stand, Bed to chair/wheelchair/BSC Sit to Stand: Min assist     Step pivot transfers: Min assist     General transfer comment: cues for hand placement and upright posture, assist to rise and steady      Balance Overall balance assessment: Needs assistance   Sitting balance-Leahy Scale: Good     Standing balance support: Bilateral upper extremity supported Standing balance-Leahy Scale: Poor                             ADL either performed or assessed with clinical judgement   ADL Overall ADL's : Needs assistance/impaired Eating/Feeding: Independent;Sitting   Grooming: Wash/dry hands;Wash/dry face;Sitting;Set up   Upper Body Bathing: Minimal assistance;Sitting   Lower Body Bathing: Sit to/from stand;Total assistance   Upper Body Dressing : Set up;Sitting   Lower Body Dressing: Total assistance;Sit to/from stand   Toilet Transfer: Minimal assistance;Stand-pivot;BSC/3in1;Rolling walker (2 wheels)                   Vision Baseline Vision/History: 1 Wears glasses Ability to See in Adequate  Light: 0 Adequate Patient Visual Report: No change from baseline       Perception         Praxis         Pertinent Vitals/Pain Pain Assessment Pain Assessment: Faces Faces Pain Scale: Hurts a little bit Pain Location: R hip Pain Descriptors / Indicators: Sore Pain Intervention(s): Monitored during session,  Repositioned     Extremity/Trunk Assessment Upper Extremity Assessment Upper Extremity Assessment: Overall WFL for tasks assessed   Lower Extremity Assessment Lower Extremity Assessment: Defer to PT evaluation   Cervical / Trunk Assessment Cervical / Trunk Assessment: Kyphotic   Communication Communication Communication: No apparent difficulties Factors Affecting Communication: Hearing impaired   Cognition Arousal: Alert Behavior During Therapy: Flat affect Cognition: No family/caregiver present to determine baseline             OT - Cognition Comments: hx of dementia, poor historian                 Following commands: Impaired Following commands impaired: Follows one step commands with increased time     Cueing  General Comments   Cueing Techniques: Verbal cues      Exercises     Shoulder Instructions      Home Living Family/patient expects to be discharged to:: Private residence Living Arrangements: Other relatives Available Help at Discharge: Family;Other (Comment) (sisters) Type of Home: House Home Access: Stairs to enter [employer] of Steps: 12 Entrance Stairs-Rails: Left;Right;Can reach both Home Layout: One level     Bathroom Shower/Tub: Chief Strategy Officer: Handicapped height Bathroom Accessibility: Yes   Home Equipment: Agricultural consultant (2 wheels);Cane - single point;Shower seat;Grab bars - tub/shower;Grab bars - toilet   Additional Comments: pt unreliable historian, home set up gathered from prior chart entry      Prior Functioning/Environment Prior Level of Function : Needs assist       Physical Assist : ADLs (physical);Mobility (physical) Mobility (physical): Bed mobility;Transfers;Gait;Stairs ADLs (physical): Toileting;IADLs;Bathing Mobility Comments: Household ambulation using RW ADLs Comments: Reports quite a bit of assist for bathing, toileting, and IADL's.    OT Problem List: Decreased  strength;Impaired balance (sitting and/or standing);Decreased cognition;Decreased safety awareness;Decreased knowledge of use of DME or AE;Pain   OT Treatment/Interventions: Self-care/ADL training;DME and/or AE instruction;Therapeutic activities;Patient/family education;Balance training      OT Goals(Current goals can be found in the care plan section)   Acute Rehab OT Goals OT Goal Formulation: With patient Time For Goal Achievement: 09/09/23 Potential to Achieve Goals: Good ADL Goals Pt Will Perform Grooming: standing;with contact guard assist Pt Will Perform Upper Body Bathing: sitting;with set-up Pt Will Perform Upper Body Dressing: sitting;with set-up Pt Will Transfer to Toilet: with contact guard assist;ambulating;bedside commode Pt Will Perform Toileting - Clothing Manipulation and hygiene: with min assist;sit to/from stand Additional ADL Goal #1: Pt will complet bed mobility modified independently in preparation for ADLs.   OT Frequency:  Min 2X/week    Co-evaluation              AM-PAC OT 6 Clicks Daily Activity     Outcome Measure Help from another person eating meals?: None Help from another person taking care of personal grooming?: A Little Help from another person toileting, which includes using toliet, bedpan, or urinal?: A Lot Help from another person bathing (including washing, rinsing, drying)?: A Lot Help from another person to put on and taking off regular upper body clothing?: A Little Help from another person to put on  and taking off regular lower body clothing?: A Lot 6 Click Score: 16   End of Session Equipment Utilized During Treatment: Gait belt;Rolling walker (2 wheels)  Activity Tolerance: Patient tolerated treatment well Patient left: in bed;with call bell/phone within reach;with bed alarm set  OT Visit Diagnosis: Unsteadiness on feet (R26.81);Other abnormalities of gait and mobility (R26.89);Pain;Muscle weakness (generalized) (M62.81);Other  symptoms and signs involving cognitive function                Time: 1229-1250 OT Time Calculation (min): 21 min Charges:  OT General Charges $OT Visit: 1 Visit OT Evaluation $OT Eval Moderate Complexity: 1 Mod  Mliss HERO, OTR/L Acute Rehabilitation Services Office: [phone]  Kennth Mliss Helling 08/26/2023, 1:47 PM

## 2023-08-26 NOTE — Progress Notes (Signed)
 Subjective: No new complaints   Antibiotics:  Anti-infectives (From admission, onward)    Start     Dose/Rate Route Frequency Ordered Stop   08/25/23 0824  tobramycin  (NEBCIN ) powder  Status:  Discontinued          As needed 08/25/23 0824 08/25/23 0855   08/25/23 0727  vancomycin  (VANCOCIN ) powder  Status:  Discontinued          As needed 08/25/23 0727 08/25/23 0855   08/25/23 0600  vancomycin  (VANCOREADY) IVPB 1250 mg/250 mL  Status:  Discontinued        1,250 mg 166.7 mL/hr over 90 Minutes Intravenous Every 24 hours 08/24/23 0625 08/24/23 1620   08/25/23 0600  ceFAZolin  (ANCEF ) IVPB 2g/100 mL premix        2 g 200 mL/hr over 30 Minutes Intravenous On call to O.R. 08/24/23 1725 08/25/23 0806   08/25/23 0600  vancomycin  (VANCOCIN ) IVPB 1000 mg/200 mL premix        1,000 mg 200 mL/hr over 60 Minutes Intravenous On call to O.R. 08/24/23 1725 08/25/23 0810   08/24/23 1730  DAPTOmycin  (CUBICIN ) IVPB 700 mg/142mL premix        8 mg/kg  83.2 kg 200 mL/hr over 30 Minutes Intravenous Daily 08/24/23 1632     08/24/23 0300  vancomycin  (VANCOREADY) IVPB 2000 mg/400 mL        2,000 mg 200 mL/hr over 120 Minutes Intravenous  Once 08/24/23 0258 08/24/23 0518       Medications: Scheduled Meds:  Chlorhexidine  Gluconate Cloth  6 each Topical Q0600   docusate sodium   100 mg Oral BID   donepezil   5 mg Oral Daily   enoxaparin  (LOVENOX ) injection  40 mg Subcutaneous Q24H   insulin  aspart  0-9 Units Subcutaneous TID WC   levothyroxine   75 mcg Oral Daily   mupirocin  ointment   Nasal BID   pantoprazole   40 mg Oral Daily   rosuvastatin   5 mg Oral Weekly   Continuous Infusions:  sodium chloride      DAPTOmycin  700 mg (08/26/23 1251)   PRN Meds:.acetaminophen  **OR** acetaminophen , methocarbamol  **OR** methocarbamol  (ROBAXIN ) injection, metoCLOPramide  **OR** metoCLOPramide  (REGLAN ) injection, morphine  injection, ondansetron  **OR** ondansetron  (ZOFRAN ) IV, oxyCODONE , polyethylene  glycol, sodium chloride  flush    Objective: Weight change:   Intake/Output Summary (Last 24 hours) at 08/26/2023 1539 Last data filed at 08/26/2023 0510 Gross per 24 hour  Intake 200 ml  Output 1350 ml  Net -1150 ml   Blood pressure 133/62, pulse (!) 112, temperature 98.3 F (36.8 C), temperature source Oral, resp. rate 18, height (P) 5' 10 (1.778 m), weight (P) 83.2 kg, SpO2 97%. Temp:  [97.6 F (36.4 C)-98.5 F (36.9 C)] 98.3 F (36.8 C) (07/04 0835) Pulse Rate:  [88-112] 112 (07/04 0835) Resp:  [16-18] 18 (07/04 0835) BP: (96-133)/(54-63) 133/62 (07/04 0835) SpO2:  [97 %-99 %] 97 % (07/04 0835)  Physical Exam: Physical Exam Constitutional:      Appearance: He is well-developed.  HENT:     Head: Normocephalic and atraumatic.  Eyes:     Conjunctiva/sclera: Conjunctivae normal.  Cardiovascular:     Rate and Rhythm: Normal rate and regular rhythm.  Pulmonary:     Effort: Pulmonary effort is normal. No respiratory distress.     Breath sounds: No wheezing.  Abdominal:     General: There is no distension.     Palpations: Abdomen is soft.  Musculoskeletal:     Cervical back: Normal range  of motion and neck supple.  Skin:    General: Skin is warm and dry.     Findings: No erythema or rash.  Neurological:     General: No focal deficit present.     Mental Status: He is alert and oriented to person, place, and time.  Psychiatric:        Mood and Affect: Mood normal.        Behavior: Behavior normal.        Thought Content: Thought content normal.        Judgment: Judgment normal.     Right hip with bandage  CBC:    BMET Recent Labs    08/25/23 0433 08/26/23 0413  NA 128* 133*  K 4.4 4.5  CL 101 105  CO2 19* 21*  GLUCOSE 145* 153*  BUN 25* 26*  CREATININE 1.69* 1.72*  CALCIUM  8.7* 8.6*     Liver Panel  Recent Labs    08/24/23 0246 08/25/23 0433  PROT 8.1 7.6  ALBUMIN  1.8* 1.5*  AST 31 21  ALT 23 20  ALKPHOS 107 88  BILITOT 0.8 0.9  BILIDIR  0.3*  --   IBILI 0.5  --        Sedimentation Rate Recent Labs    08/25/23 1412  ESRSEDRATE >140*   C-Reactive Protein Recent Labs    08/25/23 0433 08/26/23 0413  CRP 19.3* 19.5*    Micro Results: Recent Results (from the past 720 hours)  Culture, blood (single) w Reflex to ID Panel     Status: Abnormal (Preliminary result)   Collection Time: 08/22/23  3:20 PM   Specimen: Blood  Result Value Ref Range Status   MICRO NUMBER: [id number]  Preliminary   SPECIMEN QUALITY: Adequate  Preliminary   Source BLOOD 1  Preliminary   STATUS: PRELIMINARY  Preliminary   ISOLATE 1: methicillin resistant Staphylococcus aureus (AA)  Preliminary    Comment: Methicillin resistant Staphylococcus aureus (MRSA) Negative for inducible clindamycin resistance. , from anaerobic bottle only   COMMENT: Aerobic and anaerobic bottle received.  Preliminary      Susceptibility   Methicillin resistant staphylococcus aureus - BLOOD CULTURE, POSITIVE 1    VANCOMYCIN  1 Sensitive     CLINDAMYCIN <=0.25 Sensitive     ERYTHROMYCIN >=8 Resistant     GENTAMICIN <=0.5 Sensitive     OXACILLIN* NR Resistant      * Oxacillin-resistant staphylococci are resistant to all currently available beta-lactam antimicrobial agents with the possible exception of ceftaroline.     TETRACYCLINE <=1 Sensitive     TRIMETH /SULFA * <=10 Sensitive      * Oxacillin-resistant staphylococci are resistant to all currently available beta-lactam antimicrobial agents with the possible exception of ceftaroline. Legend: S = Susceptible  I = Intermediate R = Resistant  NS = Not susceptible SDD = Susceptible Dose Dependent * = Not Tested  NR = Not Reported **NN = See Therapy Comments   Culture, blood (single) w Reflex to ID Panel     Status: Abnormal   Collection Time: 08/22/23  3:22 PM   Specimen: Blood  Result Value Ref Range Status   MICRO NUMBER: [id number]  Final   SPECIMEN QUALITY: Adequate  Final   Source BLOOD 2  Final    STATUS: FINAL  Final   ISOLATE 1: methicillin resistant Staphylococcus aureus (AA)  Final    Comment: Methicillin resistant Staphylococcus aureus (MRSA) Negative for inducible clindamycin resistance. , from aerobic and anaerobic bottles   COMMENT: Aerobic and anaerobic  bottle received.  Final      Susceptibility   Methicillin resistant staphylococcus aureus - BLOOD CULTURE, POSITIVE 1    VANCOMYCIN  1 Sensitive     CLINDAMYCIN <=0.25 Sensitive     ERYTHROMYCIN >=8 Resistant     GENTAMICIN <=0.5 Sensitive     OXACILLIN* NR Resistant      * Oxacillin-resistant staphylococci are resistant to all currently available beta-lactam antimicrobial agents with the possible exception of ceftaroline.     TETRACYCLINE <=1 Sensitive     TRIMETH /SULFA * <=10 Sensitive      * Oxacillin-resistant staphylococci are resistant to all currently available beta-lactam antimicrobial agents with the possible exception of ceftaroline. Legend: S = Susceptible  I = Intermediate R = Resistant  NS = Not susceptible SDD = Susceptible Dose Dependent * = Not Tested  NR = Not Reported **NN = See Therapy Comments   Blood culture (routine x 2)     Status: None (Preliminary result)   Collection Time: 08/24/23  2:46 AM   Specimen: BLOOD LEFT WRIST  Result Value Ref Range Status   Specimen Description BLOOD LEFT WRIST  Final   Special Requests   Final    BOTTLES DRAWN AEROBIC AND ANAEROBIC Blood Culture adequate volume   Culture   Final    NO GROWTH 2 DAYS Performed at [facility], [address] [place], [place] [address]    Report Status PENDING  Incomplete  Blood culture (routine x 2)     Status: None (Preliminary result)   Collection Time: 08/24/23  2:46 AM   Specimen: BLOOD RIGHT FOREARM  Result Value Ref Range Status   Specimen Description BLOOD RIGHT FOREARM  Final   Special Requests   Final    BOTTLES DRAWN AEROBIC AND ANAEROBIC Blood Culture adequate volume   Culture   Final    NO GROWTH 2  DAYS Performed at [facility], [address] [place], [place] [address]    Report Status PENDING  Incomplete  MRSA Next Gen by PCR, Nasal     Status: Abnormal   Collection Time: 08/24/23 11:45 AM   Specimen: Nasal Mucosa; Nasal Swab  Result Value Ref Range Status   MRSA by PCR Next Gen DETECTED (A) NOT DETECTED Final    Comment: RESULT CALLED TO, READ BACK BY AND VERIFIED WITH: W EARLY,RN@1546  08/24/23 MK (NOTE) The GeneXpert MRSA Assay (FDA approved for NASAL specimens only), is one component of a comprehensive MRSA colonization surveillance program. It is not intended to diagnose MRSA infection nor to guide or monitor treatment for MRSA infections. Test performance is not FDA approved in patients less than 89 years old. Performed at [facility], [address], [place], [place] [address]   Urine Culture     Status: Abnormal   Collection Time: 08/24/23  4:26 PM   Specimen: Urine, Random  Result Value Ref Range Status   Specimen Description URINE, RANDOM  Final   Special Requests   Final    NONE Reflexed from [phone] Performed at [facility], [address] [place], [place] [address]    Culture MULTIPLE SPECIES PRESENT, SUGGEST RECOLLECTION (A)  Final   Report Status 08/25/2023 FINAL  Final  Aerobic/Anaerobic Culture w Gram Stain (surgical/deep wound)     Status: None (Preliminary result)   Collection Time: 08/25/23  8:04 AM   Specimen: PATH Soft tissue  Result Value Ref Range Status   Specimen Description TISSUE  Final   Special Requests ABSCESS RIGHT HIP  Final  Gram Stain   Final    NO WBC SEEN NO ORGANISMS SEEN Performed at [facility], [address], [place], [place] [address]    Culture FEW STAPHYLOCOCCUS AUREUS  Final   Report Status PENDING  Incomplete  Aerobic/Anaerobic Culture w Gram Stain (surgical/deep wound)     Status: None (Preliminary result)   Collection Time: 08/25/23  8:10 AM   Specimen: PATH Soft tissue  Result Value Ref Range Status    Specimen Description TISSUE  Final   Special Requests ABSCESS RIGHT HIP  Final   Gram Stain NO WBC SEEN NO ORGANISMS SEEN   Final   Culture   Final    FEW STAPHYLOCOCCUS AUREUS SUSCEPTIBILITIES TO FOLLOW Performed at [facility], [address] [place] [address], [place], [place] [address]    Report Status PENDING  Incomplete  Culture, blood (Routine X 2) w Reflex to ID Panel     Status: None (Preliminary result)   Collection Time: 08/25/23  2:12 PM   Specimen: BLOOD RIGHT ARM  Result Value Ref Range Status   Specimen Description BLOOD RIGHT ARM  Final   Special Requests   Final    BOTTLES DRAWN AEROBIC ONLY Blood Culture adequate volume   Culture   Final    NO GROWTH < 24 HOURS Performed at [facility], [address] [place], [place] [address]    Report Status PENDING  Incomplete  Culture, blood (Routine X 2) w Reflex to ID Panel     Status: None (Preliminary result)   Collection Time: 08/25/23  2:16 PM   Specimen: BLOOD RIGHT HAND  Result Value Ref Range Status   Specimen Description BLOOD RIGHT HAND  Final   Special Requests   Final    BOTTLES DRAWN AEROBIC ONLY Blood Culture adequate volume   Culture   Final    NO GROWTH < 24 HOURS Performed at [facility], [address], [place], [place] [address]    Report Status PENDING  Incomplete    Studies/Results: DG HIP UNILAT WITH PELVIS 1V RIGHT Result Date: 08/25/2023 CLINICAL DATA:  Right hip debridement for septic joint EXAM: DG HIP (WITH OR WITHOUT PELVIS) 1V RIGHT COMPARISON:  Plain film from the previous day. FLUOROSCOPY TIME:  Radiation Exposure Index (as provided by the fluoroscopic device): 0.42 mGy If the device does not provide the exposure index: Fluoroscopy Time:  3 seconds Number of Acquired Images:  1 FINDINGS: Single spot film is obtained with a grasping device debriding the right acetabulum. IMPRESSION: Right hip joint debridement. Electronically Signed   By: Oneil Devonshire M.D.   On: 08/25/2023 10:23   DG  C-Arm 1-60 Min-No Report Result Date: 08/25/2023 Fluoroscopy was utilized by the requesting physician.  No radiographic interpretation.      Assessment/Plan:  INTERVAL HISTORY: Operative cultures also with Staphylococcus aureus    Principal Problem:   MRSA bacteremia Active Problems:   AKI (acute kidney injury) (HCC)   Hyponatremia   Dementia without behavioral disturbance (HCC)   Type 2 diabetes mellitus with hyperglycemia (HCC)   Acquired hypothyroidism   GERD (gastroesophageal reflux disease)   Mixed hyperlipidemia   Acute osteomyelitis involving pelvic region and thigh (HCC)   Septic arthritis of hip (HCC)   Myositis    [name] is a 68 y.o. male with dementia, nephronia who had MRSA bacteremia of unclear etiology May 2025 status post 4 weeks of daptomycin .  He had tensive bony destruction of his femur  seen on CT of the abdomen pelvis on 17 May but it was not described as being osteomyelitis and was described as being stable?  MRI more recently shown a large effusion with destruction of the femoral head with areas in the right acetabulum also concerning for osteomyelitis along with a large joint effusion that was complex.  And some myositis in the gluteus musculature.  His repeat blood cultures taken in our clinic have also come back positive for MRSA.  He has not been admitted and undergone orthopedic surgery.  #1 Recurrent MRSA bacteremia  Likely source of THIS bacteremia and even original one may have been hip though he also had wounds  Undergone I&D and arthrotomy of the right hip with some debridement and placement of vancomycin  tobramycin  powder.  Continue daptomycin   Repeat blood cultures are wwithout growth so far  2D echo does not no vegetations and would not pursue TEE  ? He will eventually have surgery to address his femur  #3 Goals of care: given his dementia, and severe infection and signficantly morbidity involving right femur, pelvis would  strongly consider palliative care consult  #4 Contact precautions for MRSA.  #5 TDM follow CK   I have personally spent 50 minutes involved in face-to-face and non-face-to-face activities for this patient on the day of the visit. Professional time spent includes the following activities: Preparing to see the patient (review of tests), Obtaining and/or reviewing separately obtained history (admission/discharge record), Performing a medically appropriate examination and/or evaluation , Ordering medications/tests/procedures, referring and communicating with other health care professionals, Documenting clinical information in the EMR, Independently interpreting results (not separately reported), Communicating results to the patient/family/caregiver, Counseling and educating the patient/family/caregiver and Care coordination (not separately reported).   Evaluation of the patient requires complex antimicrobial therapy evaluation, counseling , isolation needs to reduce disease transmission and risk assessment and mitigation.   Dr. Eben available for questions this weekend and I will be back on Monday.   LOS: 2 days   [name] 08/26/2023, 3:39 PM

## 2023-08-26 NOTE — Plan of Care (Signed)

## 2023-08-26 NOTE — Consult Note (Signed)
 Palliative Care Consult Note                                  Date: 08/26/2023   Patient Name: [name]  DOB: [birth year]  MRN: [id number]  Age / Sex: 68 y.o., male  PCP: [name], MD Referring Physician: Rosario Leatrice FERNS, MD  Reason for Consultation: Establishing goals of care  HPI/Patient Profile: 68 y.o. male  with past medical history of HTN, hypothyroidism, diabetes mellitus, dementia, and schizophrenia admitted on 08/24/2023 with abnormal labs from outside provider (bacteremia- gram positive cocci).  Patient had recently been admitted from 07/09/23 - 07/15/23 due to MRSA bacteremia thought to be secondary to chronic indwelling suprapubic catheter. This was treated with IV vancomycin . Patient was discharged on IV daptomycin  for 4 weeks.  In ED patient was found to have right hip abscess. Brought to [facility] for I&D and arthrotomy of the right hip with some debridement and placement of vancomycin  tobramycin  powder which was done 08/25/23.     Past Medical History:  Diagnosis Date   Dementia (HCC)    Schizophrenia (HCC)     Subjective:   I have reviewed medical records including EPIC notes, labs and imaging, assessed the patient and then called his sister/legal guardian [name] to discuss diagnosis prognosis, GOC, EOL wishes, disposition and options.  I introduced Palliative Medicine as specialized medical care for people living with serious illness. It focuses on providing relief from symptoms and stress of a serious illness. The goal is to improve quality of life for both the patient and the family.  Patient's sister faces treatment option decisions, advanced directive decisions, and anticipatory care needs.  Today's Discussion: Patient was lying in bed. He appeared comfortable. He was able to tell me who he was but was not able to tell me where we were or who his family was. I reached out to his sister/guardian  [name]. We discussed the patient's chronic conditions and acute hospitalization for recurrent bacteremia and the I&D and arthrotomy of the right hip with some debridement and placement of vancomycin  tobramycin  powder  procedure yesterday. Patient's sister shared the patient was diagnosed with schizophrenia in the early 90s. His schizophrenia is currently well-managed he receives injections. We discussed the patient's increased confusion. We discussed that the patient is at a high risk for hospital delirium with his underlying dementia, procedure yesterday, and recurrent bacteremia. I shared my concern that with his dementia and declining functional status this infection may be difficult for Danyal to clear.   A discussion was had today regarding advanced directives. The patient never shared his medical wishes with his family.  Patient's sister [name] shared that she believes the patient would want to pursue aggressive measures to sustain his life- Patient remains full scope of care.  [name] shared that she believes the patient would want CPR and attempt to resuscitate him. Patient remains full code.   [name] understands the patient may require skilled nursing level care at discharge and she is okay with that-- she does have preferences around facility. She does not know if the patient's nephew would be able  to provide the necessary care required at discharge. In 2015 the patient began living with his nephew and his nephew's wife. They help the patient with ADLs and IADLs. The patient has never been married. He has 2 sisters. When he was younger the patient enjoyed going bowling but now due to his functional status he enjoys watching television.   Discussed the importance of continued conversation with family and the medical providers regarding overall plan of care and treatment options, ensuring decisions are within the context of the patient's values and GOCs.  Questions and concerns were addressed. The  family was encouraged to call with questions or concerns. PMT will continue to support holistically.  Review of Systems  Unable to perform ROS   Objective:   Primary Diagnoses: Present on Admission:  MRSA bacteremia  Hyponatremia  AKI (acute kidney injury) (HCC)  Dementia without behavioral disturbance (HCC)  Acute osteomyelitis involving pelvic region and thigh (HCC)  Septic arthritis of hip (HCC)  Myositis   Physical Exam Vitals reviewed.  Constitutional:      General: He is not in acute distress. HENT:     Head: Normocephalic and atraumatic.  Cardiovascular:     Rate and Rhythm: Tachycardia present.  Pulmonary:     Effort: Pulmonary effort is normal.  Skin:    General: Skin is warm and dry.  Neurological:     Mental Status: He is alert. He is disoriented.  Psychiatric:        Mood and Affect: Mood normal.     Vital Signs:  BP 133/62 (BP Location: Right Arm)   Pulse (!) 112   Temp 98.3 F (36.8 C) (Oral)   Resp 18   Ht (P) 5' 10 (1.778 m)   Wt (P) 83.2 kg   SpO2 97%   BMI (P) 26.32 kg/m     Advanced Care Planning:   Existing Vynca/ACP Documentation: None  Primary Decision Maker: LEGAL GUARDIAN  Code Status/Advance Care Planning: Full code   Assessment & Plan:   SUMMARY OF RECOMMENDATIONS   Full code Full scope Family okay with SNF at discharge if required Continued PMT support   Discussed with: Dr. Rosario  Time Total: 75 minutes    Thank you for allowing us  to participate in the care of [name] PMT will continue to support holistically.   Signed by: Stephane Palin, NP Palliative Medicine Team  Team Phone # [phone] (Nights/Weekends)  08/26/2023, 11:53 AM

## 2023-08-27 DIAGNOSIS — R7881 Bacteremia: Secondary | ICD-10-CM | POA: Diagnosis not present

## 2023-08-27 DIAGNOSIS — B9562 Methicillin resistant Staphylococcus aureus infection as the cause of diseases classified elsewhere: Secondary | ICD-10-CM | POA: Diagnosis not present

## 2023-08-27 LAB — RENAL FUNCTION PANEL
Albumin: 1.7 g/dL — ABNORMAL LOW (ref 3.5–5.0)
Anion gap: 8 (ref 5–15)
BUN: 22 mg/dL (ref 8–23)
CO2: 22 mmol/L (ref 22–32)
Calcium: 9 mg/dL (ref 8.9–10.3)
Chloride: 103 mmol/L (ref 98–111)
Creatinine, Ser: 1.61 mg/dL — ABNORMAL HIGH (ref 0.61–1.24)
GFR, Estimated: 47 mL/min — ABNORMAL LOW (ref >60)
Glucose, Bld: 170 mg/dL — ABNORMAL HIGH (ref 70–99)
Phosphorus: 3.5 mg/dL (ref 2.5–4.6)
Potassium: 4.3 mmol/L (ref 3.5–5.1)
Sodium: 133 mmol/L — ABNORMAL LOW (ref 135–145)

## 2023-08-27 LAB — CBC WITH DIFFERENTIAL/PLATELET
Abs Immature Granulocytes: 0.06 K/uL (ref 0.00–0.07)
Basophils Absolute: 0.1 K/uL (ref 0.0–0.1)
Basophils Relative: 1 %
Eosinophils Absolute: 0.2 K/uL (ref 0.0–0.5)
Eosinophils Relative: 2 %
HCT: 25.4 % — ABNORMAL LOW (ref 39.0–52.0)
Hemoglobin: 8 g/dL — ABNORMAL LOW (ref 13.0–17.0)
Immature Granulocytes: 1 %
Lymphocytes Relative: 30 %
Lymphs Abs: 3.2 K/uL (ref 0.7–4.0)
MCH: 26.5 pg (ref 26.0–34.0)
MCHC: 31.5 g/dL (ref 30.0–36.0)
MCV: 84.1 fL (ref 80.0–100.0)
Monocytes Absolute: 0.7 K/uL (ref 0.1–1.0)
Monocytes Relative: 7 %
Neutro Abs: 6.4 K/uL (ref 1.7–7.7)
Neutrophils Relative %: 59 %
Platelets: 438 K/uL — ABNORMAL HIGH (ref 150–400)
RBC: 3.02 MIL/uL — ABNORMAL LOW (ref 4.22–5.81)
RDW: 17.7 % — ABNORMAL HIGH (ref 11.5–15.5)
WBC: 10.6 K/uL — ABNORMAL HIGH (ref 4.0–10.5)
nRBC: 0 % (ref 0.0–0.2)

## 2023-08-27 LAB — GLUCOSE, CAPILLARY
Glucose-Capillary: 176 mg/dL — ABNORMAL HIGH (ref 70–99)
Glucose-Capillary: 202 mg/dL — ABNORMAL HIGH (ref 70–99)
Glucose-Capillary: 169 mg/dL — ABNORMAL HIGH (ref 70–99)
Glucose-Capillary: 170 mg/dL — ABNORMAL HIGH (ref 70–99)

## 2023-08-27 LAB — MAGNESIUM: Magnesium: 1.9 mg/dL (ref 1.7–2.4)

## 2023-08-27 NOTE — Progress Notes (Signed)
 [facility]                                                                              Ukiah Trawick, is a 68 y.o. male, DOB - Jan 12, 1956, FMW:[id number] Admit date - 08/24/2023    Outpatient Primary MD for the patient is [name], [name], MD  LOS - 3  days  No chief complaint on file.    Called to the ER by ID clinic with positive culture reports.  Brief summary  Patient is a 68 year old male with history of HTN, hypothyroidism, diabetes mellitus, presented to ED with abnormal labs. Patient was admitted from 5/17 to 5/23 due to MRSA bacteremia that was thought to be secondary to chronic indwelling suprapubic catheter which was treated with IV vancomycin . TTE done on 07/12/2023 showed no vegetations and patient refused TEE. He was discharged and treated with IV daptomycin  daily for about 4 weeks. PICC line was placed prior to discharge at that time.  Per EDP, patient's nephew received a phone call from patient's physician to go to [facility] immediately due to bacteremia, gram-positive cocci in blood.  In ED, vital signs stable, temp 97.4 F, pulse 102, normocytic anemia, sodium 129, creatinine 1.77.  Blood cultures positive for gram-positive cocci.  He was placed on IV vancomycin  then subsequently on IV daptomycin . Patient was found to have right hip abscess, brought to [facility] for incision debridement.  08/27/2023: Patient seen.  No new complaints.  Patient underwent arthrotomy/I&D of right hip for septic arthritis yesterday, 08/25/2023.  Patient is currently on IV daptomycin .   Assessment & Plan    GPC bacteremia.  Recurrent MRSA bacteremia. -Recent admission 5/17 to 5/23 with MRSA bacteremia, thought to be secondary to chronic indwelling suprapubic catheter, was treated with IV vancomycin  and completed 4 weeks of IV therapy at home. -2D echo 5/20 had shown EF> 75%, no vegetations, patient had refused TEE -Culture 6/30, with MRSA. -Repeat cultures 7/3,  negative so far. -ID following.  Right hip MRI 6/26 as outpatient with severe destructive changes. I&D, debridement antibiotic placement and biopsy today Dr. Kendal. 08/27/2023: Status post I&D of right hip abscess on 08/25/2023.  Continue IV daptomycin .  Input from the infectious disease team is highly appreciated.   Chronic hyponatremia - Likely multifactorial. - Sodium is improving.  Sodium of 133 noted today.   Acute kidney injury - Serum creatinine of 1. 61 today.   - Baseline serum creatinine of 1.20. - Current AKI is likely related to the bacteremia.  Cannot entirely rule out volume depletion.  Repeat urinalysis.   Type 2 diabetes mellitus with hyperglycemia -Hemoglobin A1c on 06/23/2023 was 7.0 -Continue sliding scale insulin  while inpatient    Acquired hypothyroidism Continue Synthroid    GERD Continue Protonix    Dementia Continue Aricept    Mixed hyperlipidemia Continue Crestor   Estimated body mass index is 26.32 kg/m (pended) as calculated from the following:   Height as of this encounter: (P) 5' 10 (1.778 m).   Weight as of this encounter: (P) 83.2 kg.  Code Status: Full code.  Consult palliative care team. DVT Prophylaxis:  SCDs Start: 08/25/23 0949 enoxaparin  (  LOVENOX ) injection 40 mg Start: 08/24/23 1000 SCDs Start: 08/24/23 9378   Level of Care: Level of care: Med-Surg Family Communication: None today at the bedside. Disposition Plan:      Remains inpatient appropriate: per ID and orthopedics recommendations.  Transfer to [facility].   Procedures:  Right hip I&D.  Consultants:   Infectious disease, Dr. [facility]  Antimicrobials:   Anti-infectives (From admission, onward)    Start     Dose/Rate Route Frequency Ordered Stop   08/25/23 0824  tobramycin  (NEBCIN ) powder  Status:  Discontinued          As needed 08/25/23 0824 08/25/23 0855   08/25/23 0727  vancomycin  (VANCOCIN ) powder  Status:  Discontinued          As needed 08/25/23  0727 08/25/23 0855   08/25/23 0600  vancomycin  (VANCOREADY) IVPB 1250 mg/250 mL  Status:  Discontinued        1,250 mg 166.7 mL/hr over 90 Minutes Intravenous Every 24 hours 08/24/23 0625 08/24/23 1620   08/25/23 0600  ceFAZolin  (ANCEF ) IVPB 2g/100 mL premix        2 g 200 mL/hr over 30 Minutes Intravenous On call to O.R. 08/24/23 1725 08/25/23 0806   08/25/23 0600  vancomycin  (VANCOCIN ) IVPB 1000 mg/200 mL premix        1,000 mg 200 mL/hr over 60 Minutes Intravenous On call to O.R. 08/24/23 1725 08/25/23 0810   08/24/23 1730  DAPTOmycin  (CUBICIN ) IVPB 700 mg/124mL premix        8 mg/kg  83.2 kg 200 mL/hr over 30 Minutes Intravenous Daily 08/24/23 1632     08/24/23 0300  vancomycin  (VANCOREADY) IVPB 2000 mg/400 mL        2,000 mg 200 mL/hr over 120 Minutes Intravenous  Once 08/24/23 0258 08/24/23 0518          Medications  Chlorhexidine  Gluconate Cloth  6 each Topical Q0600   docusate sodium   100 mg Oral BID   donepezil   5 mg Oral Daily   enoxaparin  (LOVENOX ) injection  40 mg Subcutaneous Q24H   insulin  aspart  0-9 Units Subcutaneous TID WC   levothyroxine   75 mcg Oral Daily   mupirocin  ointment   Nasal BID   pantoprazole   40 mg Oral Daily   rosuvastatin   5 mg Oral Weekly      Subjective:   Patient seen. Poor historian.     Objective:   Vitals:   08/26/23 1558 08/26/23 2122 08/27/23 0508 08/27/23 0806  BP: 122/62 134/65 (!) 119/54 129/87  Pulse: (!) 103 (!) 101 (!) 104 (!) 106  Resp: 18 16 18 18   Temp: 98.5 F (36.9 C) 98.4 F (36.9 C) 97.7 F (36.5 C) 98.1 F (36.7 C)  TempSrc: Oral Oral Oral Oral  SpO2: 98% 97% 95% 95%  Weight:      Height:        Intake/Output Summary (Last 24 hours) at 08/27/2023 1255 Last data filed at 08/27/2023 0537 Gross per 24 hour  Intake 180 ml  Output 1200 ml  Net -1020 ml     Wt Readings from Last 3 Encounters:  08/25/23 (P) 83.2 kg  08/23/23 90.7 kg  07/17/23 87 kg     General: Chronically ill looking.     Cardiovascular: S1-S2 normal.  . Respiratory: Clear to auscultation. Gastrointestinal: Soft.  Nontender.   Suprapubic catheter present. Ext: No swelling or edema.  Patient has postop dressing right hip looks clean and dry. Neuro: Patient is  awake and alert.    Data Reviewed:  I have personally reviewed following labs    CBC Lab Results  Component Value Date   WBC 10.6 (H) 08/27/2023   RBC 3.02 (L) 08/27/2023   HGB 8.0 (L) 08/27/2023   HCT 25.4 (L) 08/27/2023   MCV 84.1 08/27/2023   MCH 26.5 08/27/2023   PLT 438 (H) 08/27/2023   MCHC 31.5 08/27/2023   RDW 17.7 (H) 08/27/2023   LYMPHSABS 3.2 08/27/2023   MONOABS 0.7 08/27/2023   EOSABS 0.2 08/27/2023   BASOSABS 0.1 08/27/2023     Last metabolic panel Lab Results  Component Value Date   NA 133 (L) 08/27/2023   K 4.3 08/27/2023   CL 103 08/27/2023   CO2 22 08/27/2023   BUN 22 08/27/2023   CREATININE 1.61 (H) 08/27/2023   GLUCOSE 170 (H) 08/27/2023   GFRNONAA 47 (L) 08/27/2023   GFRAA >90 09/20/2012   CALCIUM  9.0 08/27/2023   PHOS 3.5 08/27/2023   PROT 7.6 08/25/2023   ALBUMIN  1.7 (L) 08/27/2023   BILITOT 0.9 08/25/2023   ALKPHOS 88 08/25/2023   AST 21 08/25/2023   ALT 20 08/25/2023   ANIONGAP 8 08/27/2023    CBG (last 3)  Recent Labs    08/26/23 2121 08/27/23 0804 08/27/23 1122  GLUCAP 196* 176* 202*      Coagulation Profile: No results for input(s): INR, PROTIME in the last 168 hours.   Radiology Studies: I have personally reviewed the imaging studies  No results found.      Leatrice LILLETTE Chapel M.D. [facility] 08/27/2023, 12:55 PM  Total time spent: 52 minutes.

## 2023-08-27 NOTE — TOC CM/SW Note (Addendum)
 CSW spoke w/ pt sister Farhad, [place] [phone] to discuss SNF. Sister is declining SNF and states that she wants pt to return home w/ HHPT. CSW provided education on SNF vs HHPT and sister verbalized understanding. Pt resides at home with his nephew and nephew spouse who assist with pt care. TOC to setup HHPT prior to dc.

## 2023-08-27 NOTE — Progress Notes (Signed)
 2 Days Post-Op Procedure(s) (LRB): INCISION AND DRAINAGE OF DEEP ABSCESS, HIP JOINT, ANTERIOR APPROACH (Right) Subjective:  Patient reports pain as mild to moderate.  Slept okay.  Mobilized some with PT/OT yesterday, some reported difficulty following commands due to baseline dementia.  No reports of chest pain, ShOB, N/V.  Denies numbness or tingling.  No overnight events or other complaints at this time.    Objective:   VITALS:   Vitals:   08/26/23 0934 08/26/23 1558 08/26/23 2122 08/27/23 0508  BP: 137/81 122/62 134/65 (!) 119/54  Pulse: 96 (!) 103 (!) 101 (!) 104  Resp: 16 18 16 18   Temp: 98.6 F (37 C) 98.5 F (36.9 C) 98.4 F (36.9 C) 97.7 F (36.5 C)  TempSrc: Oral Oral Oral Oral  SpO2: 98% 98% 97% 95%  Weight:      Height:        Alert, sitting comfortably, in NAD Neurologically intact Sensation intact distally Intact pulses distally Dorsiflexion/Plantar flexion intact Incision: dressing C/D/I Wiggles toes appropriately Skin: some surrounding erythema and with some firmness to palpation.  See photo below     Lab Results  Component Value Date   WBC 10.6 (H) 08/27/2023   HGB 8.0 (L) 08/27/2023   HCT 25.4 (L) 08/27/2023   MCV 84.1 08/27/2023   PLT 438 (H) 08/27/2023   BMET    Component Value Date/Time   NA 133 (L) 08/27/2023 0311   K 4.3 08/27/2023 0311   CL 103 08/27/2023 0311   CO2 22 08/27/2023 0311   GLUCOSE 170 (H) 08/27/2023 0311   BUN 22 08/27/2023 0311   CREATININE 1.61 (H) 08/27/2023 0311   CREATININE 1.77 (H) 08/22/2023 1520   CALCIUM  9.0 08/27/2023 0311   GFRNONAA 47 (L) 08/27/2023 0311     Xray: None  Assessment/Plan: 2 Days Post-Op   Principal Problem:   MRSA bacteremia Active Problems:   AKI (acute kidney injury) (HCC)   Hyponatremia   Dementia without behavioral disturbance (HCC)   Type 2 diabetes mellitus with hyperglycemia (HCC)   Acquired hypothyroidism   GERD (gastroesophageal reflux disease)   Mixed  hyperlipidemia   Acute osteomyelitis involving pelvic region and thigh (HCC)   Septic arthritis of hip (HCC)   Myositis   Bacteremia   Procedure(s) (LRB): INCISION AND DRAINAGE OF DEEP ABSCESS, HIP JOINT, ANTERIOR APPROACH (Right)   Narrative: 08/26/23: Hgb 7.9, previous of 7.6, 7.7, 8.2 -- appears stable.  Infectious disease managing antibiotic therapy.  Mobilize with PT today. 08/27/23: Hgb 8.0, appears stable.  WBC 10.0.  Mild erythema and firmness of hip tissue, likely localized swelling.  Bandage still intact w/o drainage.  On antibiotics.  Discussed with attending Dr. Edna.  Continue to monitor area and for signs of worsening infection.  No recent fevers.  Continue mobilizing with PT/OT.  Plan: Up with therapy Incentive Spirometry Elevate and Apply ice  Weightbearing: WBAT ROM/Precautions:  no formal precautions Mobility: Continue with PT/OT Insicional and dressing care: Reinforce dressings PRN Pain management: multimodal Antibiotics: Periop abx, see infectious disease note Diet: Advance as tolerated VTE prophylaxis: 81mg  ASA daily x 4 weeks Dispo: per medicine Follow - up plan: follow up in office with Dr. Kendal outpatient as discussed    Bernarda CHRISTELLA Mclean 08/27/2023, 8:01 AM   Contact information:   Tzzxijbd 7am-5pm epic message Dr. Edna, or call office for patient follow up: [phone] After hours and holidays please check [email or web address] for group call information for [facility]

## 2023-08-27 NOTE — Plan of Care (Signed)

## 2023-08-28 DIAGNOSIS — R7881 Bacteremia: Secondary | ICD-10-CM | POA: Diagnosis not present

## 2023-08-28 DIAGNOSIS — B9562 Methicillin resistant Staphylococcus aureus infection as the cause of diseases classified elsewhere: Secondary | ICD-10-CM | POA: Diagnosis not present

## 2023-08-28 LAB — CBC WITH DIFFERENTIAL/PLATELET
Absolute Lymphocytes: 2576 {cells}/uL (ref 850–3900)
Absolute Monocytes: 757 {cells}/uL (ref 200–950)
Basophils Absolute: 45 {cells}/uL (ref 0–200)
Basophils Relative: 0.4 %
Eosinophils Absolute: 68 {cells}/uL (ref 15–500)
Eosinophils Relative: 0.6 %
HCT: 27.3 % — ABNORMAL LOW (ref 38.5–50.0)
Hemoglobin: 8.2 g/dL — ABNORMAL LOW (ref 13.2–17.1)
MCH: 26.2 pg — ABNORMAL LOW (ref 27.0–33.0)
MCHC: 30 g/dL — ABNORMAL LOW (ref 32.0–36.0)
MCV: 87.2 fL (ref 80.0–100.0)
MPV: 9.9 fL (ref 7.5–12.5)
Monocytes Relative: 6.7 %
Neutro Abs: 7854 {cells}/uL — ABNORMAL HIGH (ref 1500–7800)
Neutrophils Relative %: 69.5 %
Platelets: 367 Thousand/uL (ref 140–400)
RBC: 3.13 Million/uL — ABNORMAL LOW (ref 4.20–5.80)
RDW: 16 % — ABNORMAL HIGH (ref 11.0–15.0)
Total Lymphocyte: 22.8 %
WBC: 11.3 Thousand/uL — ABNORMAL HIGH (ref 3.8–10.8)

## 2023-08-28 LAB — RENAL FUNCTION PANEL
Albumin: 1.7 g/dL — ABNORMAL LOW (ref 3.5–5.0)
Anion gap: 9 (ref 5–15)
BUN: 23 mg/dL (ref 8–23)
CO2: 23 mmol/L (ref 22–32)
Calcium: 9 mg/dL (ref 8.9–10.3)
Chloride: 106 mmol/L (ref 98–111)
Creatinine, Ser: 1.55 mg/dL — ABNORMAL HIGH (ref 0.61–1.24)
GFR, Estimated: 49 mL/min — ABNORMAL LOW (ref >60)
Glucose, Bld: 165 mg/dL — ABNORMAL HIGH (ref 70–99)
Phosphorus: 3.7 mg/dL (ref 2.5–4.6)
Potassium: 4.3 mmol/L (ref 3.5–5.1)
Sodium: 138 mmol/L (ref 135–145)

## 2023-08-28 LAB — COMPLETE METABOLIC PANEL WITHOUT GFR
AG Ratio: 0.5 (calc) — ABNORMAL LOW (ref 1.0–2.5)
ALT: 15 U/L (ref 9–46)
AST: 16 U/L (ref 10–35)
Albumin: 2.7 g/dL — ABNORMAL LOW (ref 3.6–5.1)
Alkaline phosphatase (APISO): 107 U/L (ref 35–144)
BUN/Creatinine Ratio: 15 (calc) (ref 6–22)
BUN: 27 mg/dL — ABNORMAL HIGH (ref 7–25)
CO2: 22 mmol/L (ref 20–32)
Calcium: 8.8 mg/dL (ref 8.6–10.3)
Chloride: 96 mmol/L — ABNORMAL LOW (ref 98–110)
Creat: 1.77 mg/dL — ABNORMAL HIGH (ref 0.70–1.35)
Globulin: 5.5 g/dL — ABNORMAL HIGH (ref 1.9–3.7)
Glucose, Bld: 250 mg/dL — ABNORMAL HIGH (ref 65–99)
Potassium: 4.6 mmol/L (ref 3.5–5.3)
Sodium: 130 mmol/L — ABNORMAL LOW (ref 135–146)
Total Bilirubin: 0.7 mg/dL (ref 0.2–1.2)
Total Protein: 8.2 g/dL — ABNORMAL HIGH (ref 6.1–8.1)

## 2023-08-28 LAB — URINALYSIS, ROUTINE W REFLEX MICROSCOPIC
Bilirubin Urine: NEGATIVE
Glucose, UA: NEGATIVE mg/dL
Ketones, ur: NEGATIVE mg/dL
Nitrite: NEGATIVE
Protein, ur: 30 mg/dL — AB
Specific Gravity, Urine: 1.012 (ref 1.005–1.030)
WBC, UA: >50 WBC/hpf (ref 0–5)
pH: 5 (ref 5.0–8.0)

## 2023-08-28 LAB — CBC
HCT: 24.6 % — ABNORMAL LOW (ref 39.0–52.0)
Hemoglobin: 7.9 g/dL — ABNORMAL LOW (ref 13.0–17.0)
MCH: 27.4 pg (ref 26.0–34.0)
MCHC: 32.1 g/dL (ref 30.0–36.0)
MCV: 85.4 fL (ref 80.0–100.0)
Platelets: 422 K/uL — ABNORMAL HIGH (ref 150–400)
RBC: 2.88 MIL/uL — ABNORMAL LOW (ref 4.22–5.81)
RDW: 17.8 % — ABNORMAL HIGH (ref 11.5–15.5)
WBC: 9.3 K/uL (ref 4.0–10.5)
nRBC: 0 % (ref 0.0–0.2)

## 2023-08-28 LAB — CULTURE, BLOOD (SINGLE)
MICRO NUMBER:: 16642550
SPECIMEN QUALITY:: ADEQUATE

## 2023-08-28 LAB — GLUCOSE, CAPILLARY
Glucose-Capillary: 174 mg/dL — ABNORMAL HIGH (ref 70–99)
Glucose-Capillary: 164 mg/dL — ABNORMAL HIGH (ref 70–99)
Glucose-Capillary: 177 mg/dL — ABNORMAL HIGH (ref 70–99)
Glucose-Capillary: 144 mg/dL — ABNORMAL HIGH (ref 70–99)

## 2023-08-28 LAB — SODIUM, URINE, RANDOM: Sodium, Ur: 81 mmol/L

## 2023-08-28 NOTE — Progress Notes (Addendum)
   08/28/23 1244  Mobility  Activity Stood at bedside  Level of Assistance Moderate assist, patient does 50-74% (+2)  Assistive Device Other (Comment) (HHA (no RW in room))  Range of Motion/Exercises Active;Right leg;Left leg  Activity Response Tolerated fair  Mobility Referral Yes  Mobility visit 1 Mobility  Mobility Specialist Start Time (ACUTE ONLY) 1244  Mobility Specialist Stop Time (ACUTE ONLY) 1317  Mobility Specialist Time Calculation (min) (ACUTE ONLY) 33 min   Mobility Specialist: Progress Note  Pt agreeable to mobility session - received in bed. C/o Pain in R hip. Pt with large soft brown stool, required assistance with pericare. Returned to bed with all needs met - call bell within reach. Bed alarm on.   Exercises(x8): Kicks, Marches  STS x3, Lateral Scoot x3  Virgle Boards, BS Mobility Specialist Please contact via SecureChat or  Rehab office at [phone].

## 2023-08-28 NOTE — Plan of Care (Signed)

## 2023-08-28 NOTE — Progress Notes (Signed)
 [facility]                                                                              Other Atienza, is a 68 y.o. male, DOB - 06-10-1955, FMW:[id number] Admit date - 08/24/2023    Outpatient Primary MD for the patient is [name], [name], MD  LOS - 4  days  No chief complaint on file.    Called to the ER by ID clinic with positive culture reports.  Brief summary  Patient is a 68 year old male with history of HTN, hypothyroidism, diabetes mellitus, presented to ED with abnormal labs. Patient was admitted from 5/17 to 5/23 due to MRSA bacteremia that was thought to be secondary to chronic indwelling suprapubic catheter which was treated with IV vancomycin . TTE done on 07/12/2023 showed no vegetations and patient refused TEE. He was discharged and treated with IV daptomycin  daily for about 4 weeks. PICC line was placed prior to discharge at that time.  Per EDP, patient's nephew received a phone call from patient's physician to go to [facility] immediately due to bacteremia, gram-positive cocci in blood.  In ED, vital signs stable, temp 97.4 F, pulse 102, normocytic anemia, sodium 129, creatinine 1.77.  Blood cultures positive for gram-positive cocci.  He was placed on IV vancomycin  then subsequently on IV daptomycin . Patient was found to have right hip abscess, brought to [facility] for incision debridement.  08/28/2023: Patient seen.  No new complaints.  Patient underwent arthrotomy/I&D of right hip for septic arthritis on 08/25/2023.  Patient is currently on IV daptomycin .   Assessment & Plan    GPC bacteremia.  Recurrent MRSA bacteremia. -Recent admission 5/17 to 5/23 with MRSA bacteremia, thought to be secondary to chronic indwelling suprapubic catheter, was treated with IV vancomycin  and completed 4 weeks of IV therapy at home. -2D echo 5/20 had shown EF> 75%, no vegetations, patient had refused TEE -Culture 6/30, with MRSA. -Repeat cultures 7/3, negative  so far. -ID following.  Right hip MRI 6/26 as outpatient with severe destructive changes. I&D, debridement antibiotic placement and biopsy today Dr. Kendal. 08/28/2023: Status post I&D of right hip abscess on 08/25/2023.  Continue IV daptomycin .  Input from the infectious disease team is highly appreciated.   Chronic hyponatremia - Likely multifactorial. - Resolved. - Sodium of 138 today.     Acute kidney injury - Serum creatinine of 1. 55 today.   - Baseline serum creatinine of 1.20. - Current AKI is likely related to the bacteremia.  Cannot entirely rule out volume depletion.  Repeat urinalysis.   Type 2 diabetes mellitus with hyperglycemia -Hemoglobin A1c on 06/23/2023 was 7.0 -Continue sliding scale insulin  while inpatient    Acquired hypothyroidism Continue Synthroid    GERD Continue Protonix    Dementia Continue Aricept    Mixed hyperlipidemia Continue Crestor   Estimated body mass index is 26.32 kg/m (pended) as calculated from the following:   Height as of this encounter: (P) 5' 10 (1.778 m).   Weight as of this encounter: (P) 83.2 kg.  Code Status: Full code.  Consult palliative care team. DVT Prophylaxis:  SCDs Start: 08/25/23 0949 enoxaparin  (LOVENOX )  injection 40 mg Start: 08/24/23 1000 SCDs Start: 08/24/23 9378   Level of Care: Level of care: Med-Surg Family Communication: None today at the bedside. Disposition Plan:      Remains inpatient appropriate: per ID and orthopedics recommendations.  Transfer to [facility].   Procedures:  Right hip I&D.  Consultants:   Infectious disease, Dr. [facility]  Antimicrobials:   Anti-infectives (From admission, onward)    Start     Dose/Rate Route Frequency Ordered Stop   08/25/23 0824  tobramycin  (NEBCIN ) powder  Status:  Discontinued          As needed 08/25/23 0824 08/25/23 0855   08/25/23 0727  vancomycin  (VANCOCIN ) powder  Status:  Discontinued          As needed 08/25/23 0727 08/25/23 0855    08/25/23 0600  vancomycin  (VANCOREADY) IVPB 1250 mg/250 mL  Status:  Discontinued        1,250 mg 166.7 mL/hr over 90 Minutes Intravenous Every 24 hours 08/24/23 0625 08/24/23 1620   08/25/23 0600  ceFAZolin  (ANCEF ) IVPB 2g/100 mL premix        2 g 200 mL/hr over 30 Minutes Intravenous On call to O.R. 08/24/23 1725 08/25/23 0806   08/25/23 0600  vancomycin  (VANCOCIN ) IVPB 1000 mg/200 mL premix        1,000 mg 200 mL/hr over 60 Minutes Intravenous On call to O.R. 08/24/23 1725 08/25/23 0810   08/24/23 1730  DAPTOmycin  (CUBICIN ) IVPB 700 mg/172mL premix        8 mg/kg  83.2 kg 200 mL/hr over 30 Minutes Intravenous Daily 08/24/23 1632     08/24/23 0300  vancomycin  (VANCOREADY) IVPB 2000 mg/400 mL        2,000 mg 200 mL/hr over 120 Minutes Intravenous  Once 08/24/23 0258 08/24/23 0518          Medications  Chlorhexidine  Gluconate Cloth  6 each Topical Q0600   docusate sodium   100 mg Oral BID   donepezil   5 mg Oral Daily   enoxaparin  (LOVENOX ) injection  40 mg Subcutaneous Q24H   insulin  aspart  0-9 Units Subcutaneous TID WC   levothyroxine   75 mcg Oral Daily   mupirocin  ointment   Nasal BID   pantoprazole   40 mg Oral Daily   rosuvastatin   5 mg Oral Weekly      Subjective:   Patient seen. Poor historian.     Objective:   Vitals:   08/28/23 0410 08/28/23 0805 08/28/23 1010 08/28/23 1500  BP: 126/80 107/61  (!) 113/54  Pulse: (!) 102 (!) 105 94 88  Resp: 18 20 16 20   Temp: 98.5 F (36.9 C) 97.8 F (36.6 C)  98.4 F (36.9 C)  TempSrc: Oral     SpO2: 95% 93% 96% 96%  Weight:      Height:        Intake/Output Summary (Last 24 hours) at 08/28/2023 1531 Last data filed at 08/28/2023 1211 Gross per 24 hour  Intake 868.39 ml  Output 2360 ml  Net -1491.61 ml     Wt Readings from Last 3 Encounters:  08/25/23 (P) 83.2 kg  08/23/23 90.7 kg  07/17/23 87 kg     General: Chronically ill looking.    Cardiovascular: S1-S2 normal.  . Respiratory: Clear to  auscultation. Gastrointestinal: Soft.  Nontender.   Suprapubic catheter present. Ext: No swelling or edema.  Patient has postop dressing right hip looks clean and dry. Neuro: Patient is awake and alert.  Data Reviewed:  I have personally reviewed following labs    CBC Lab Results  Component Value Date   WBC 9.3 08/28/2023   RBC 2.88 (L) 08/28/2023   HGB 7.9 (L) 08/28/2023   HCT 24.6 (L) 08/28/2023   MCV 85.4 08/28/2023   MCH 27.4 08/28/2023   PLT 422 (H) 08/28/2023   MCHC 32.1 08/28/2023   RDW 17.8 (H) 08/28/2023   LYMPHSABS 3.2 08/27/2023   MONOABS 0.7 08/27/2023   EOSABS 0.2 08/27/2023   BASOSABS 0.1 08/27/2023     Last metabolic panel Lab Results  Component Value Date   NA 138 08/28/2023   K 4.3 08/28/2023   CL 106 08/28/2023   CO2 23 08/28/2023   BUN 23 08/28/2023   CREATININE 1.55 (H) 08/28/2023   GLUCOSE 165 (H) 08/28/2023   GFRNONAA 49 (L) 08/28/2023   GFRAA >90 09/20/2012   CALCIUM  9.0 08/28/2023   PHOS 3.7 08/28/2023   PROT 7.6 08/25/2023   ALBUMIN  1.7 (L) 08/28/2023   BILITOT 0.9 08/25/2023   ALKPHOS 88 08/25/2023   AST 21 08/25/2023   ALT 20 08/25/2023   ANIONGAP 9 08/28/2023    CBG (last 3)  Recent Labs    08/27/23 2057 08/28/23 0803 08/28/23 1111  GLUCAP 170* 174* 164*      Coagulation Profile: No results for input(s): INR, PROTIME in the last 168 hours.   Radiology Studies: I have personally reviewed the imaging studies  No results found.      Leatrice LILLETTE Chapel M.D. [facility] 08/28/2023, 3:31 PM  Total time spent: 52 minutes.

## 2023-08-29 DIAGNOSIS — B9562 Methicillin resistant Staphylococcus aureus infection as the cause of diseases classified elsewhere: Secondary | ICD-10-CM | POA: Diagnosis not present

## 2023-08-29 DIAGNOSIS — M00051 Staphylococcal arthritis, right hip: Secondary | ICD-10-CM | POA: Diagnosis not present

## 2023-08-29 DIAGNOSIS — M86151 Other acute osteomyelitis, right femur: Secondary | ICD-10-CM | POA: Diagnosis not present

## 2023-08-29 DIAGNOSIS — R7881 Bacteremia: Secondary | ICD-10-CM | POA: Diagnosis not present

## 2023-08-29 LAB — TYPE AND SCREEN
ABO/RH(D): B POS
Antibody Screen: NEGATIVE
Unit division: 0
Unit division: 0

## 2023-08-29 LAB — CULTURE, BLOOD (ROUTINE X 2)
Culture: NO GROWTH
Culture: NO GROWTH
Special Requests: ADEQUATE
Special Requests: ADEQUATE

## 2023-08-29 LAB — GLUCOSE, CAPILLARY
Glucose-Capillary: 163 mg/dL — ABNORMAL HIGH (ref 70–99)
Glucose-Capillary: 150 mg/dL — ABNORMAL HIGH (ref 70–99)
Glucose-Capillary: 143 mg/dL — ABNORMAL HIGH (ref 70–99)
Glucose-Capillary: 136 mg/dL — ABNORMAL HIGH (ref 70–99)

## 2023-08-29 LAB — BPAM RBC
Blood Product Expiration Date: 202507262359
Blood Product Expiration Date: 202507282359
ISSUE DATE / TIME: 202507030835
Unit Type and Rh: 7300
Unit Type and Rh: 7300

## 2023-08-29 NOTE — Progress Notes (Signed)
 Occupational Therapy Treatment Patient Details Name: [name] MRN: [id number] DOB: 1956/02/12 Today's Date: 08/29/2023   History of present illness Patient is 68 y.o. male presented to ED with abnormal labs on 08/24/23. Per EDP, patient's physician instructed pt to go to [facility] immediately due to bacteremia, gram-positive cocci in blood. Pt recently admitted from 5/17 to 5/23 due to MRSA bacteremia thought to be secondary to chronic indwelling suprapubic catheter, treated with IV vancomycin . TTE done on 07/12/2023 showed no vegetations and patient refused TEE. Pt discharged with IV daptomycin  daily for about 4 weeks via PICC. Right hip MRI 6/26 as outpatient with severe destructive changes. Pt now s/p arthrotomy I&D of Rt hip for deep abscess and septic arthritis. PMH significant for dementia, schizophrenia,   OT comments  Pt making progress with functional goals. Pt in bed upon arrival, pleasantly confused and agreeable to work with OT. Pt required mod A with increased time to complete sitting to EOB with min A to scoot hips forward. Pt participate din grooming/hygiene, UB dressing CGA, mod A sit - stand from EOB to SPT to BSC min A with multimodal cues for sequencing and safety. Pt limited by R hip pain and declined transfer to recliner after standing form BSC. Pt SPT min A back to sitting EOB and min A with LEs into bed, pt able to scoot to [facility] using rails to pull himself up. OT will continue to follow acutely to maximize level of function and safety      If plan is discharge home, recommend the following:  A little help with walking and/or transfers;A lot of help with bathing/dressing/bathroom;Assistance with cooking/housework;Direct supervision/assist for medications management;Direct supervision/assist for financial management;Assist for transportation;Help with stairs or ramp for entrance   Equipment Recommendations  Other (comment) (defer)    Recommendations for Other Services       Precautions / Restrictions Precautions Precautions: Fall Recall of Precautions/Restrictions: Intact Restrictions Weight Bearing Restrictions Per Provider Order: No       Mobility Bed Mobility Overal bed mobility: Needs Assistance Bed Mobility: Supine to Sit, Sit to Supine     Supine to sit: Mod assist, HOB elevated, Used rails Sit to supine: Min assist   General bed mobility comments: mod A with LE mgt and trunk elevation to sit EOB, cues for sequencing, min A with LEs back into bed    Transfers Overall transfer level: Needs assistance Equipment used: Rolling walker (2 wheels) Transfers: Sit to/from Stand, Bed to chair/wheelchair/BSC Sit to Stand: Mod assist Stand pivot transfers: Min assist         General transfer comment: cues for hand placement and upright posture     Balance Overall balance assessment: Needs assistance Sitting-balance support: No upper extremity supported, Feet supported Sitting balance-Leahy Scale: Good     Standing balance support: Bilateral upper extremity supported, During functional activity Standing balance-Leahy Scale: Poor                             ADL either performed or assessed with clinical judgement   ADL Overall ADL's : Needs assistance/impaired     Grooming: Wash/dry hands;Wash/dry face;Brushing hair;Contact guard assist           Upper Body Dressing : Contact guard assist;Sitting       Toilet Transfer: Minimal assistance;Stand-pivot;BSC/3in1;Rolling walker (2 wheels);Cueing for safety;Cueing for sequencing   Toileting- Clothing Manipulation and Hygiene: Total assistance;Sit to/from stand  Functional mobility during ADLs: Moderate assistance;Minimal assistance;Cueing for safety;Cueing for sequencing;Rolling walker (2 wheels)      Extremity/Trunk Assessment Upper Extremity Assessment Upper Extremity Assessment: Generalized weakness   Lower Extremity Assessment Lower Extremity Assessment:  Defer to PT evaluation        Vision Baseline Vision/History: 1 Wears glasses Ability to See in Adequate Light: 0 Adequate Patient Visual Report: No change from baseline     Perception     Praxis     Communication Communication Communication: No apparent difficulties   Cognition Arousal: Alert Behavior During Therapy: Flat affect               OT - Cognition Comments: hx of dementia, poor historian                 Following commands: Impaired Following commands impaired: Follows one step commands with increased time      Cueing   Cueing Techniques: Verbal cues, Tactile cues  Exercises      Shoulder Instructions       General Comments      Pertinent Vitals/ Pain       Pain Assessment Pain Assessment: Faces Faces Pain Scale: Hurts little more Pain Location: R hip Pain Descriptors / Indicators: Sore, Grimacing Pain Intervention(s): Limited activity within patient's tolerance, Monitored during session, Repositioned  Home Living                                          Prior Functioning/Environment              Frequency  Min 2X/week        Progress Toward Goals  OT Goals(current goals can now be found in the care plan section)  Progress towards OT goals: Progressing toward goals     Plan      Co-evaluation                 AM-PAC OT 6 Clicks Daily Activity     Outcome Measure   Help from another person eating meals?: None Help from another person taking care of personal grooming?: A Little Help from another person toileting, which includes using toliet, bedpan, or urinal?: A Lot Help from another person bathing (including washing, rinsing, drying)?: A Lot Help from another person to put on and taking off regular upper body clothing?: A Little Help from another person to put on and taking off regular lower body clothing?: A Lot 6 Click Score: 16    End of Session Equipment Utilized During Treatment:  Gait belt;Rolling walker (2 wheels);Other (comment) (BSC)  OT Visit Diagnosis: Unsteadiness on feet (R26.81);Other abnormalities of gait and mobility (R26.89);Pain;Muscle weakness (generalized) (M62.81);Other symptoms and signs involving cognitive function Pain - Right/Left: Right Pain - part of body: Hip   Activity Tolerance Patient limited by pain   Patient Left in bed;with call bell/phone within reach;with bed alarm set   Nurse Communication Mobility status        Time: 8581-8556 OT Time Calculation (min): 25 min  Charges: OT General Charges $OT Visit: 1 Visit OT Treatments $Self Care/Home Management : 8-22 mins $Therapeutic Activity: 8-22 mins    Jacques Karna Loose 08/29/2023, 2:52 PM

## 2023-08-29 NOTE — Discharge Instructions (Addendum)
 Orthopaedic Trauma Service Discharge Instructions   General Discharge Instructions  WEIGHT BEARING STATUS:weightbearing as tolerated to the right lower extremity   RANGE OF MOTION/ACTIVITY: ok for hip range of motion as tolerated  Wound Care: You may remove your surgical dressing on post op day #3 (Sunday 08/28/23). Incisions can be left open to air if there is no drainage. Once the incision is completely dry and without drainage, it may be left open to air out.  Showering may begin post op day #4 (Monday 08/29/23).  Clean incision gently with soap and water.  DVT/PE prophylaxis: Aspirin  81 mg daily x 30 days  Diet: as you were eating previously.  Can use over the counter stool softeners and bowel preparations, such as Miralax , to help with bowel movements.  Narcotics can be constipating.  Be sure to drink plenty of fluids  PAIN MEDICATION USE AND EXPECTATIONS  You have likely been given narcotic medications to help control your pain.  After a traumatic event that results in an fracture (broken bone) with or without surgery, it is ok to use narcotic pain medications to help control one's pain.  We understand that everyone responds to pain differently and each individual patient will be evaluated on a regular basis for the continued need for narcotic medications. Ideally, narcotic medication use should last no more than 6-8 weeks (coinciding with fracture healing).   As a patient it is your responsibility as well to monitor narcotic medication use and report the amount and frequency you use these medications when you come to your office visit.   We would also advise that if you are using narcotic medications, you should take a dose prior to therapy to maximize you participation.  IF YOU ARE ON NARCOTIC MEDICATIONS IT IS NOT PERMISSIBLE TO OPERATE A MOTOR VEHICLE (MOTORCYCLE/CAR/TRUCK/MOPED) OR HEAVY MACHINERY DO NOT MIX NARCOTICS WITH OTHER CNS (CENTRAL NERVOUS SYSTEM) DEPRESSANTS SUCH AS  ALCOHOL   POST-OPERATIVE OPIOID TAPER INSTRUCTIONS: It is important to wean off of your opioid medication as soon as possible. If you do not need pain medication after your surgery it is ok to stop day one. Opioids include: Codeine, Hydrocodone (Norco, Vicodin), Oxycodone (Percocet, oxycontin ) and hydromorphone  amongst others.  Long term and even short term use of opiods can cause: Increased pain response Dependence Constipation Depression Respiratory depression And more.  Withdrawal symptoms can include Flu like symptoms Nausea, vomiting And more Techniques to manage these symptoms Hydrate well Eat regular healthy meals Stay active Use relaxation techniques(deep breathing, meditating, yoga) Do Not substitute Alcohol  to help with tapering If you have been on opioids for less than two weeks and do not have pain than it is ok to stop all together.  Plan to wean off of opioids This plan should start within one week post op of your fracture surgery  Maintain the same interval or time between taking each dose and first decrease the dose.  Cut the total daily intake of opioids by one tablet each day Next start to increase the time between doses. The last dose that should be eliminated is the evening dose.    STOP SMOKING OR USING NICOTINE  PRODUCTS!!!!  As discussed nicotine  severely impairs your body's ability to heal surgical and traumatic wounds but also impairs bone healing.  Wounds and bone heal by forming microscopic blood vessels (angiogenesis) and nicotine  is a vasoconstrictor (essentially, shrinks blood vessels).  Therefore, if vasoconstriction occurs to these microscopic blood vessels they essentially disappear and are unable to deliver necessary nutrients to  the healing tissue.  This is one modifiable factor that you can do to dramatically increase your chances of healing your injury.  (This means no smoking, no nicotine  gum, patches, etc)  DO NOT USE NONSTEROIDAL  ANTI-INFLAMMATORY DRUGS (NSAID'S)  Using products such as Advil (ibuprofen), Aleve (naproxen), Motrin (ibuprofen) for additional pain control during fracture healing can delay and/or prevent the healing response.  If you would like to take over the counter (OTC) medication, Tylenol  (acetaminophen ) is ok.  However, some narcotic medications that are given for pain control contain acetaminophen  as well. Therefore, you should not exceed more than 4000 mg of tylenol  in a day if you do not have liver disease.  Also note that there are may OTC medicines, such as cold medicines and allergy medicines that my contain tylenol  as well.  If you have any questions about medications and/or interactions please ask your doctor/PA or your pharmacist.      ICE AND ELEVATE INJURED/OPERATIVE EXTREMITY  Using ice and elevating the injured extremity above your heart can help with swelling and pain control.  Icing in a pulsatile fashion, such as 20 minutes on and 20 minutes off, can be followed.    Do not place ice directly on skin. Make sure there is a barrier between to skin and the ice pack.    Using frozen items such as frozen peas works well as the conform nicely to the are that needs to be iced.  USE AN ACE WRAP OR TED HOSE FOR SWELLING CONTROL  In addition to icing and elevation, Ace wraps or TED hose are used to help limit and resolve swelling.  It is recommended to use Ace wraps or TED hose until you are informed to stop.    When using Ace Wraps start the wrapping distally (farthest away from the body) and wrap proximally (closer to the body)   Example: If you had surgery on your leg or thing and you do not have a splint on, start the ace wrap at the toes and work your way up to the thigh        If you had surgery on your upper extremity and do not have a splint on, start the ace wrap at your fingers and work your way up to the upper arm   CALL THE OFFICE FOR MEDICATION REFILLS OR WITH ANY QUESTIONS/CONCERNS:  [phone]   VISIT OUR WEBSITE FOR ADDITIONAL INFORMATION: [email or web address] Discharge Wound Care Instructions  Do NOT apply any ointments, solutions or lotions to pin sites or surgical wounds.  These prevent needed drainage and even though solutions like hydrogen peroxide kill bacteria, they also damage cells lining the pin sites that help fight infection.  Applying lotions or ointments can keep the wounds moist and can cause them to breakdown and open up as well. This can increase the risk for infection. When in doubt call the office.  Surgical incisions should be dressed daily.  If any drainage is noted, use foam dressing (mepilex) - These dressing supplies should be available at local medical supply stores [facility], [facility], etc) as well as Insurance claims handler ([facility], [facility], [employer], etc)  Once the incision is completely dry and without drainage, it may be left open to air out.  Showering may begin 36-48 hours later.  Cleaning gently with soap and water.    Call office for the following: Temperature greater than 101F Persistent nausea and vomiting Severe uncontrolled pain Redness, tenderness, or signs of infection (pain, swelling, redness, odor  or green/yellow discharge around the site) Difficulty breathing, headache or visual disturbances Hives Persistent dizziness or light-headedness Extreme fatigue Any other questions or concerns you may have after discharge  In an emergency, call 911 or go to an Emergency Department at a nearby hospital  OTHER HELPFUL INFORMATION  If you had a block, it will wear off between 8-24 hrs postop typically.  This is period when your pain may go from nearly zero to the pain you would have had postop without the block.  This is an abrupt transition but nothing dangerous is happening.  You may take an extra dose of narcotic when this happens.  You should wean off your narcotic medicines as soon as you are able.  Most patients will be  off or using minimal narcotics before their first postop appointment.   We suggest you use the pain medication the first night prior to going to bed, in order to ease any pain when the anesthesia wears off. You should avoid taking pain medications on an empty stomach as it will make you nauseous.  Do not drink alcoholic beverages or take illicit drugs when taking pain medications.  In most states it is against the law to drive while you are in a splint or sling.  And certainly against the law to drive while taking narcotics.  You may return to work/school in the next couple of days when you feel up to it.   Pain medication may make you constipated.  Below are a few solutions to try in this order: Decrease the amount of pain medication if you aren't having pain. Drink lots of decaffeinated fluids. Drink prune juice and/or each dried prunes  If the first 3 don't work start with additional solutions Take Colace - an over-the-counter stool softener Take Senokot - an over-the-counter laxative Take Miralax  - a stronger over-the-counter laxative

## 2023-08-29 NOTE — Telephone Encounter (Signed)
 Please advise on linezolid  refill Pt currently admitted.

## 2023-08-29 NOTE — Progress Notes (Addendum)
 Subjective: No new complaints  Antibiotics:  Anti-infectives (From admission, onward)    Start     Dose/Rate Route Frequency Ordered Stop   08/25/23 0824  tobramycin  (NEBCIN ) powder  Status:  Discontinued          As needed 08/25/23 0824 08/25/23 0855   08/25/23 0727  vancomycin  (VANCOCIN ) powder  Status:  Discontinued          As needed 08/25/23 0727 08/25/23 0855   08/25/23 0600  vancomycin  (VANCOREADY) IVPB 1250 mg/250 mL  Status:  Discontinued        1,250 mg 166.7 mL/hr over 90 Minutes Intravenous Every 24 hours 08/24/23 0625 08/24/23 1620   08/25/23 0600  ceFAZolin  (ANCEF ) IVPB 2g/100 mL premix        2 g 200 mL/hr over 30 Minutes Intravenous On call to O.R. 08/24/23 1725 08/25/23 0806   08/25/23 0600  vancomycin  (VANCOCIN ) IVPB 1000 mg/200 mL premix        1,000 mg 200 mL/hr over 60 Minutes Intravenous On call to O.R. 08/24/23 1725 08/25/23 0810   08/24/23 1730  DAPTOmycin  (CUBICIN ) IVPB 700 mg/175mL premix        8 mg/kg  83.2 kg 200 mL/hr over 30 Minutes Intravenous Daily 08/24/23 1632     08/24/23 0300  vancomycin  (VANCOREADY) IVPB 2000 mg/400 mL        2,000 mg 200 mL/hr over 120 Minutes Intravenous  Once 08/24/23 0258 08/24/23 0518       Medications: Scheduled Meds:  Chlorhexidine  Gluconate Cloth  6 each Topical Q0600   docusate sodium   100 mg Oral BID   donepezil   5 mg Oral Daily   enoxaparin  (LOVENOX ) injection  40 mg Subcutaneous Q24H   insulin  aspart  0-9 Units Subcutaneous TID WC   levothyroxine   75 mcg Oral Daily   mupirocin  ointment   Nasal BID   pantoprazole   40 mg Oral Daily   rosuvastatin   5 mg Oral Weekly   Continuous Infusions:  sodium chloride      DAPTOmycin  700 mg (08/28/23 1430)   PRN Meds:.acetaminophen  **OR** acetaminophen , methocarbamol  **OR** methocarbamol  (ROBAXIN ) injection, metoCLOPramide  **OR** metoCLOPramide  (REGLAN ) injection, morphine  injection, ondansetron  **OR** ondansetron  (ZOFRAN ) IV, oxyCODONE , polyethylene  glycol, sodium chloride  flush    Objective: Weight change:   Intake/Output Summary (Last 24 hours) at 08/29/2023 1247 Last data filed at 08/29/2023 0600 Gross per 24 hour  Intake 228.28 ml  Output 1290 ml  Net -1061.72 ml   Blood pressure (!) 103/57, pulse 87, temperature 97.9 F (36.6 C), resp. rate 14, height (P) 5' 10 (1.778 m), weight (P) 83.2 kg, SpO2 96%. Temp:  [97.6 F (36.4 C)-98.4 F (36.9 C)] 97.9 F (36.6 C) (07/07 0843) Pulse Rate:  [77-97] 87 (07/07 0843) Resp:  [14-20] 14 (07/07 0843) BP: (103-130)/(54-68) 103/57 (07/07 0843) SpO2:  [93 %-100 %] 96 % (07/07 0843)  Physical Exam: Physical Exam Constitutional:      Appearance: He is well-developed.  HENT:     Head: Normocephalic and atraumatic.  Eyes:     Conjunctiva/sclera: Conjunctivae normal.  Cardiovascular:     Rate and Rhythm: Normal rate and regular rhythm.  Pulmonary:     Effort: Pulmonary effort is normal. No respiratory distress.     Breath sounds: Normal breath sounds. No stridor. No wheezing.  Abdominal:     General: There is no distension.     Palpations: Abdomen is soft.  Musculoskeletal:  General: Normal range of motion.     Cervical back: Normal range of motion and neck supple.  Skin:    General: Skin is warm and dry.     Findings: No erythema or rash.  Neurological:     General: No focal deficit present.     Mental Status: He is alert.  Psychiatric:        Attention and Perception: He is inattentive.        Mood and Affect: Mood normal.        Speech: Speech is delayed.        Behavior: Behavior normal.        Thought Content: Thought content normal.        Cognition and Memory: He exhibits impaired recent memory and impaired remote memory.        Judgment: Judgment normal.     Right hip with bandage  CBC:    BMET Recent Labs    08/27/23 0311 08/28/23 0354  NA 133* 138  K 4.3 4.3  CL 103 106  CO2 22 23  GLUCOSE 170* 165*  BUN 22 23  CREATININE 1.61* 1.55*   CALCIUM  9.0 9.0     Liver Panel  Recent Labs    08/27/23 0311 08/28/23 0354  ALBUMIN  1.7* 1.7*       Sedimentation Rate No results for input(s): ESRSEDRATE in the last 72 hours.  C-Reactive Protein No results for input(s): CRP in the last 72 hours.   Micro Results: Recent Results (from the past 720 hours)  Culture, blood (single) w Reflex to ID Panel     Status: Abnormal   Collection Time: 08/22/23  3:20 PM   Specimen: Blood  Result Value Ref Range Status   MICRO NUMBER: [id number]  Final   SPECIMEN QUALITY: Adequate  Final   Source BLOOD 1  Final   STATUS: FINAL  Final   ISOLATE 1: methicillin resistant Staphylococcus aureus (AA)  Final    Comment: Methicillin resistant Staphylococcus aureus (MRSA) Negative for inducible clindamycin resistance. , from anaerobic bottle only   COMMENT: Aerobic and anaerobic bottle received.  Final      Susceptibility   Methicillin resistant staphylococcus aureus - BLOOD CULTURE, POSITIVE 1    VANCOMYCIN  1 Sensitive     CLINDAMYCIN <=0.25 Sensitive     ERYTHROMYCIN >=8 Resistant     GENTAMICIN <=0.5 Sensitive     OXACILLIN* NR Resistant      * Oxacillin-resistant staphylococci are resistant to all currently available beta-lactam antimicrobial agents with the possible exception of ceftaroline.     TETRACYCLINE <=1 Sensitive     TRIMETH /SULFA * <=10 Sensitive      * Oxacillin-resistant staphylococci are resistant to all currently available beta-lactam antimicrobial agents with the possible exception of ceftaroline. Legend: S = Susceptible  I = Intermediate R = Resistant  NS = Not susceptible SDD = Susceptible Dose Dependent * = Not Tested  NR = Not Reported **NN = See Therapy Comments   Culture, blood (single) w Reflex to ID Panel     Status: Abnormal   Collection Time: 08/22/23  3:22 PM   Specimen: Blood  Result Value Ref Range Status   MICRO NUMBER: [id number]  Final   SPECIMEN QUALITY: Adequate  Final   Source BLOOD 2   Final   STATUS: FINAL  Final   ISOLATE 1: methicillin resistant Staphylococcus aureus (AA)  Final    Comment: Methicillin resistant Staphylococcus aureus (MRSA) Negative for inducible clindamycin resistance. , from  aerobic and anaerobic bottles   COMMENT: Aerobic and anaerobic bottle received.  Final      Susceptibility   Methicillin resistant staphylococcus aureus - BLOOD CULTURE, POSITIVE 1    VANCOMYCIN  1 Sensitive     CLINDAMYCIN <=0.25 Sensitive     ERYTHROMYCIN >=8 Resistant     GENTAMICIN <=0.5 Sensitive     OXACILLIN* NR Resistant      * Oxacillin-resistant staphylococci are resistant to all currently available beta-lactam antimicrobial agents with the possible exception of ceftaroline.     TETRACYCLINE <=1 Sensitive     TRIMETH /SULFA * <=10 Sensitive      * Oxacillin-resistant staphylococci are resistant to all currently available beta-lactam antimicrobial agents with the possible exception of ceftaroline. Legend: S = Susceptible  I = Intermediate R = Resistant  NS = Not susceptible SDD = Susceptible Dose Dependent * = Not Tested  NR = Not Reported **NN = See Therapy Comments   Blood culture (routine x 2)     Status: None   Collection Time: 08/24/23  2:46 AM   Specimen: BLOOD LEFT WRIST  Result Value Ref Range Status   Specimen Description BLOOD LEFT WRIST  Final   Special Requests   Final    BOTTLES DRAWN AEROBIC AND ANAEROBIC Blood Culture adequate volume   Culture   Final    NO GROWTH 5 DAYS Performed at [facility], [address], [place], [place] [address]    Report Status 08/29/2023 FINAL  Final  Blood culture (routine x 2)     Status: None   Collection Time: 08/24/23  2:46 AM   Specimen: BLOOD RIGHT FOREARM  Result Value Ref Range Status   Specimen Description BLOOD RIGHT FOREARM  Final   Special Requests   Final    BOTTLES DRAWN AEROBIC AND ANAEROBIC Blood Culture adequate volume   Culture   Final    NO GROWTH 5 DAYS Performed at [facility],  [address] [place], [place] [address]    Report Status 08/29/2023 FINAL  Final  MRSA Next Gen by PCR, Nasal     Status: Abnormal   Collection Time: 08/24/23 11:45 AM   Specimen: Nasal Mucosa; Nasal Swab  Result Value Ref Range Status   MRSA by PCR Next Gen DETECTED (A) NOT DETECTED Final    Comment: RESULT CALLED TO, READ BACK BY AND VERIFIED WITH: W EARLY,RN@1546  08/24/23 MK (NOTE) The GeneXpert MRSA Assay (FDA approved for NASAL specimens only), is one component of a comprehensive MRSA colonization surveillance program. It is not intended to diagnose MRSA infection nor to guide or monitor treatment for MRSA infections. Test performance is not FDA approved in patients less than 58 years old. Performed at [facility], [address] [place], [place] [address]   Urine Culture     Status: Abnormal   Collection Time: 08/24/23  4:26 PM   Specimen: Urine, Random  Result Value Ref Range Status   Specimen Description URINE, RANDOM  Final   Special Requests   Final    NONE Reflexed from [id number] Performed at [facility], [address], [place], [place] [address]    Culture MULTIPLE SPECIES PRESENT, SUGGEST RECOLLECTION (A)  Final   Report Status 08/25/2023 FINAL  Final  Aerobic/Anaerobic Culture w Gram Stain (surgical/deep wound)     Status: None (Preliminary result)   Collection Time: 08/25/23  8:04 AM   Specimen: PATH Soft tissue  Result Value Ref Range Status   Specimen Description TISSUE  Final  Special Requests ABSCESS RIGHT HIP  Final   Gram Stain   Final    NO WBC SEEN NO ORGANISMS SEEN Performed at [facility], [address] [place], [place] [address]    Culture   Final    FEW STAPHYLOCOCCUS AUREUS SUSCEPTIBILITIES PERFORMED ON PREVIOUS CULTURE WITHIN THE LAST 5 DAYS. NO ANAEROBES ISOLATED; CULTURE IN PROGRESS FOR 5 DAYS    Report Status PENDING  Incomplete  Aerobic/Anaerobic Culture w Gram Stain (surgical/deep wound)     Status: None (Preliminary result)    Collection Time: 08/25/23  8:10 AM   Specimen: PATH Soft tissue  Result Value Ref Range Status   Specimen Description TISSUE  Final   Special Requests ABSCESS RIGHT HIP  Final   Gram Stain NO WBC SEEN NO ORGANISMS SEEN   Final   Culture   Final    FEW METHICILLIN RESISTANT STAPHYLOCOCCUS AUREUS NO ANAEROBES ISOLATED; CULTURE IN PROGRESS FOR 5 DAYS CULTURE REINCUBATED FOR BETTER GROWTH Performed at [facility], [address], [place], [place] [address]    Report Status PENDING  Incomplete   Organism ID, Bacteria METHICILLIN RESISTANT STAPHYLOCOCCUS AUREUS  Final      Susceptibility   Methicillin resistant staphylococcus aureus - MIC*    CIPROFLOXACIN  <=0.5 SENSITIVE Sensitive     ERYTHROMYCIN >=8 RESISTANT Resistant     GENTAMICIN <=0.5 SENSITIVE Sensitive     OXACILLIN >=4 RESISTANT Resistant     TETRACYCLINE <=1 SENSITIVE Sensitive     VANCOMYCIN  1 SENSITIVE Sensitive     TRIMETH /SULFA  <=10 SENSITIVE Sensitive     CLINDAMYCIN <=0.25 SENSITIVE Sensitive     RIFAMPIN <=0.5 SENSITIVE Sensitive     Inducible Clindamycin NEGATIVE Sensitive     LINEZOLID  2 SENSITIVE Sensitive     * FEW METHICILLIN RESISTANT STAPHYLOCOCCUS AUREUS  Culture, blood (Routine X 2) w Reflex to ID Panel     Status: None (Preliminary result)   Collection Time: 08/25/23  2:12 PM   Specimen: BLOOD RIGHT ARM  Result Value Ref Range Status   Specimen Description BLOOD RIGHT ARM  Final   Special Requests   Final    BOTTLES DRAWN AEROBIC ONLY Blood Culture adequate volume   Culture   Final    NO GROWTH 4 DAYS Performed at [facility], [address], [place], [place] [address]    Report Status PENDING  Incomplete  Culture, blood (Routine X 2) w Reflex to ID Panel     Status: None (Preliminary result)   Collection Time: 08/25/23  2:16 PM   Specimen: BLOOD RIGHT HAND  Result Value Ref Range Status   Specimen Description BLOOD RIGHT HAND  Final   Special Requests   Final    BOTTLES DRAWN  AEROBIC ONLY Blood Culture adequate volume   Culture   Final    NO GROWTH 4 DAYS Performed at [facility], [address] [place], [place] [address]    Report Status PENDING  Incomplete    Studies/Results: No results found.     Assessment/Plan:  INTERVAL HISTORY:   Operative cultures also with MRSA   Principal Problem:   MRSA bacteremia Active Problems:   AKI (acute kidney injury) (HCC)   Hyponatremia   Dementia without behavioral disturbance (HCC)   Type 2 diabetes mellitus with hyperglycemia (HCC)   Acquired hypothyroidism   GERD (gastroesophageal reflux disease)   Mixed hyperlipidemia   Acute osteomyelitis involving pelvic region and thigh (HCC)   Septic arthritis  of hip [facility])   Myositis   Bacteremia    [name] is a 68 y.o. male with dementia, nephronia who had MRSA bacteremia of unclear etiology May 2025 status post 4 weeks of daptomycin .  He had tensive bony destruction of his femur seen on CT of the abdomen pelvis on 17 May but it was not described as being osteomyelitis and was described as being stable?  MRI more recently shown a large effusion with destruction of the femoral head with areas in the right acetabulum also concerning for osteomyelitis along with a large joint effusion that was complex.  And some myositis in the gluteus musculature.  His repeat blood cultures taken in our clinic have also come back positive for MRSA.  He has not been admitted and undergone orthopedic surgery.  #1 Recurrent MRSA bacteremia  Likely source of THIS bacteremia and even original one may have been hip though he also had wounds  Undergone I&D and arthrotomy of the right hip with some debridement and placement of vancomycin  tobramycin  powder.  Continue daptomycin   Repeat blood cultures are wwithout growth so far at day #4 from admission  If these remain negative and final at day #5 would then place PICC line  2D echo does not no vegetations and  would not pursue TEE  ? He will eventually have surgery to address his femur  WHile he is on aggressive care we will plan on giving him 6 weeks of postoperative daptomycin  likely followed by attempts at suppression with oral antibiotics  Diagnosis: Recurrent MRSA bacteremia, septic hip, osteo of pelvis    Culture Result: MRSA  No Known Allergies  OPAT Orders Discharge antibiotics to be given via PICC line Discharge antibiotics: Daptomycin  700 mg IV daily Duration: 6 weeks End Date: 10/04/2023  [facility] Care Per Protocol:  Home health RN for IV administration and teaching; PICC line care and labs.    Labs weekly while on IV antibiotics: _x_ CBC with differential _x_ BMP __ CMP _x_ CRP _x_ ESR __ Vancomycin  trough _x_ CK  _x_ Please pull PIC at completion of IV antibiotics __ Please leave PIC in place until doctor has seen patient or been notified  Fax weekly labs to [phone]  Clinic Follow Up Appt:   DAMION KANT has an appointment on 09/21/2023 at 130PM with Dr. Dennise at  [facility], which  is located in the [facility] at  [address] in [place].  [address], which is located to the left of the elevators.  Phone: [phone]  Fax: [phone]  [email or web address]  The patient should arrive 30 minutes prior to their appoitment.  #3 Goals of care: Really appreciate palliative care seeing the patient  #4 Contact precautions for MRSA.  #5 TDM follow CK   I have personally spent 54 minutes involved in face-to-face and non-face-to-face activities for this patient on the day of the visit. Professional time spent includes the following activities: Preparing to see the patient (review of tests), Obtaining and/or reviewing separately obtained history (admission/discharge record), Performing a medically appropriate examination and/or evaluation , Ordering  medications/tests/procedures, referring and communicating with other health care professionals, Documenting clinical information in the EMR, Independently interpreting results (not separately reported), Communicating results to the patient/family/caregiver, Counseling and educating the patient/family/caregiver and Care coordination (not separately reported).   Evaluation of the patient requires complex antimicrobial therapy evaluation, counseling , isolation needs to reduce disease transmission and risk assessment and  mitigation.   I will sign off for now  Please call with further questions.    LOS: 5 days   Jomarie Fleeta Rothman 08/29/2023, 12:47 PM

## 2023-08-29 NOTE — Progress Notes (Signed)
 [facility]                                                                              [name], is a 68 y.o. male, DOB - Mar 21, 1955, FMW:[id number] Admit date - 08/24/2023    Outpatient Primary [name] for the patient is [name], Clifford NOVAK, [name]  LOS - 5  days  No chief complaint on file.    Called to the ER by ID clinic with positive culture reports.  Brief summary  Patient is a 68 year old male with history of HTN, hypothyroidism, diabetes mellitus, presented to ED with abnormal labs. Patient was admitted from 5/17 to 5/23 due to MRSA bacteremia that was thought to be secondary to chronic indwelling suprapubic catheter which was treated with IV vancomycin . TTE done on 07/12/2023 showed no vegetations and patient refused TEE. He was discharged and treated with IV daptomycin  daily for about 4 weeks. PICC line was placed prior to discharge at that time.  Per EDP, patient's nephew received a phone call from patient's physician to go to [facility] immediately due to bacteremia, gram-positive cocci in blood.  In ED, vital signs stable, temp 97.4 F, pulse 102, normocytic anemia, sodium 129, creatinine 1.77.  Blood cultures positive for gram-positive cocci.  He was placed on IV vancomycin  then subsequently on IV daptomycin . Patient was found to have right hip abscess, brought to [facility] for incision debridement.  08/29/2023: Patient seen.  No new complaints.  Patient underwent arthrotomy/I&D of right hip for septic arthritis on 08/25/2023.  Patient is currently on IV daptomycin .  Infectious disease team is directing care.   Assessment & Plan  GPC bacteremia.  Recurrent MRSA bacteremia. -Recent admission 5/17 to 5/23 with MRSA bacteremia, thought to be secondary to chronic indwelling suprapubic catheter, was treated with IV vancomycin  and completed 4 weeks of IV therapy at home. -2D echo 5/20 had shown EF> 75%, no vegetations, patient had refused TEE -Culture 6/30,  with MRSA. -Repeat cultures 7/3, negative so far. -ID following.  Right hip MRI 6/26 as outpatient with severe destructive changes. I&D, debridement antibiotic placement and biopsy today Dr. Kendal. 08/29/2023: Status post I&D of right hip abscess on 08/25/2023.  Continue IV daptomycin .  Input from the infectious disease team is highly appreciated.   Chronic hyponatremia - Likely multifactorial. - Resolved. - Last sodium level was 138.      Acute kidney injury - Last serum creatinine of 1. 55   - Baseline serum creatinine of 1.20. - Current AKI is likely related to the bacteremia.  Cannot entirely rule out volume depletion.  Repeat urinalysis.   Type 2 diabetes mellitus with hyperglycemia -Hemoglobin A1c on 06/23/2023 was 7.0 -Continue sliding scale insulin  while inpatient    Acquired hypothyroidism Continue Synthroid    GERD Continue Protonix    Dementia Continue Aricept    Mixed hyperlipidemia Continue Crestor   Estimated body mass index is 26.32 kg/m (pended) as calculated from the following:   Height as of this encounter: (P) 5' 10 (1.778 m).   Weight as of this encounter: (P) 83.2 kg.  Code Status: Full code.  Consult palliative care team. DVT Prophylaxis:  SCDs Start: 08/25/23 0949 enoxaparin  (LOVENOX ) injection 40 mg Start: 08/24/23 1000 SCDs Start: 08/24/23 0621   Level of Care: Level of care: Med-Surg Family Communication: None today at the bedside. Disposition Plan:      Remains inpatient appropriate: per ID and orthopedics recommendations.  Transfer to [facility].   Procedures:  Right hip I&D.  Consultants:   Infectious disease, Dr. [facility]  Antimicrobials:   Anti-infectives (From admission, onward)    Start     Dose/Rate Route Frequency Ordered Stop   08/25/23 0824  tobramycin  (NEBCIN ) powder  Status:  Discontinued          As needed 08/25/23 0824 08/25/23 0855   08/25/23 0727  vancomycin  (VANCOCIN ) powder  Status:  Discontinued           As needed 08/25/23 0727 08/25/23 0855   08/25/23 0600  vancomycin  (VANCOREADY) IVPB 1250 mg/250 mL  Status:  Discontinued        1,250 mg 166.7 mL/hr over 90 Minutes Intravenous Every 24 hours 08/24/23 0625 08/24/23 1620   08/25/23 0600  ceFAZolin  (ANCEF ) IVPB 2g/100 mL premix        2 g 200 mL/hr over 30 Minutes Intravenous On call to O.R. 08/24/23 1725 08/25/23 0806   08/25/23 0600  vancomycin  (VANCOCIN ) IVPB 1000 mg/200 mL premix        1,000 mg 200 mL/hr over 60 Minutes Intravenous On call to O.R. 08/24/23 1725 08/25/23 0810   08/24/23 1730  DAPTOmycin  (CUBICIN ) IVPB 700 mg/161mL premix        8 mg/kg  83.2 kg 200 mL/hr over 30 Minutes Intravenous Daily 08/24/23 1632     08/24/23 0300  vancomycin  (VANCOREADY) IVPB 2000 mg/400 mL        2,000 mg 200 mL/hr over 120 Minutes Intravenous  Once 08/24/23 0258 08/24/23 0518          Medications  Chlorhexidine  Gluconate Cloth  6 each Topical Q0600   docusate sodium   100 mg Oral BID   donepezil   5 mg Oral Daily   enoxaparin  (LOVENOX ) injection  40 mg Subcutaneous Q24H   insulin  aspart  0-9 Units Subcutaneous TID WC   levothyroxine   75 mcg Oral Daily   mupirocin  ointment   Nasal BID   pantoprazole   40 mg Oral Daily   rosuvastatin   5 mg Oral Weekly      Subjective:   Patient seen. Poor historian.     Objective:   Vitals:   08/28/23 2020 08/29/23 0418 08/29/23 0843 08/29/23 1738  BP: 130/68 123/67 (!) 103/57 126/65  Pulse: 97 77 87 92  Resp: 18 18 14 16   Temp: 97.6 F (36.4 C) 98.2 F (36.8 C) 97.9 F (36.6 C) 97.9 F (36.6 C)  TempSrc: Oral Oral    SpO2: 100% 93% 96% 96%  Weight:      Height:        Intake/Output Summary (Last 24 hours) at 08/29/2023 1928 Last data filed at 08/29/2023 1800 Gross per 24 hour  Intake 240 ml  Output 1600 ml  Net -1360 ml     Wt Readings from Last 3 Encounters:  08/25/23 (P) 83.2 kg  08/23/23 90.7 kg  07/17/23 87 kg     General: Chronically ill looking.     Cardiovascular: S1-S2 normal.  . Respiratory: Clear to auscultation. Gastrointestinal: Soft.  Nontender.   Suprapubic catheter present. Ext: No swelling or edema.  Patient has postop dressing right hip looks clean and dry. Neuro: Patient  is awake and alert.    Data Reviewed:  I have personally reviewed following labs    CBC Lab Results  Component Value Date   WBC 9.3 08/28/2023   RBC 2.88 (L) 08/28/2023   HGB 7.9 (L) 08/28/2023   HCT 24.6 (L) 08/28/2023   MCV 85.4 08/28/2023   MCH 27.4 08/28/2023   PLT 422 (H) 08/28/2023   MCHC 32.1 08/28/2023   RDW 17.8 (H) 08/28/2023   LYMPHSABS 3.2 08/27/2023   MONOABS 0.7 08/27/2023   EOSABS 0.2 08/27/2023   BASOSABS 0.1 08/27/2023     Last metabolic panel Lab Results  Component Value Date   NA 138 08/28/2023   K 4.3 08/28/2023   CL 106 08/28/2023   CO2 23 08/28/2023   BUN 23 08/28/2023   CREATININE 1.55 (H) 08/28/2023   GLUCOSE 165 (H) 08/28/2023   GFRNONAA 49 (L) 08/28/2023   GFRAA >90 09/20/2012   CALCIUM  9.0 08/28/2023   PHOS 3.7 08/28/2023   PROT 7.6 08/25/2023   ALBUMIN  1.7 (L) 08/28/2023   BILITOT 0.9 08/25/2023   ALKPHOS 88 08/25/2023   AST 21 08/25/2023   ALT 20 08/25/2023   ANIONGAP 9 08/28/2023    CBG (last 3)  Recent Labs    08/29/23 0825 08/29/23 1239 08/29/23 1733  GLUCAP 163* 150* 143*      Coagulation Profile: No results for input(s): INR, PROTIME in the last 168 hours.   Radiology Studies: I have personally reviewed the imaging studies  No results found.      Leatrice LILLETTE Chapel M.D. [facility] 08/29/2023, 7:28 PM  Total time spent: 52 minutes.

## 2023-08-29 NOTE — Progress Notes (Signed)
 PHARMACY CONSULT NOTE FOR:  OUTPATIENT  PARENTERAL ANTIBIOTIC THERAPY (OPAT)  Indication: MRSA bacteremia, septic hip and osteo of pelvis Regimen: Daptomycin  700 mg IV Q 24 hours  End date: 10/04/23  IV antibiotic discharge orders are pended. To discharging provider:  please sign these orders via discharge navigator,  Select New Orders & click on the button choice - Manage This Unsigned Work.     Thank you for allowing pharmacy to be a part of this patient's care.  Damien Quiet, PharmD, BCPS, BCIDP Infectious Diseases Clinical Pharmacist Phone: [phone] 08/29/2023, 1:04 PM

## 2023-08-29 NOTE — Progress Notes (Incomplete)
 [facility]                                                                              [name], is a 68 y.o. male, DOB - [birth year], FMW:[id number] Admit date - 08/24/2023    Outpatient Primary MD for the patient is [name], Clifford NOVAK, MD  LOS - 5  days  No chief complaint on file.    Called to the ER by ID clinic with positive culture reports.  Brief summary  Patient is a 69 year old male with history of HTN, hypothyroidism, diabetes mellitus, presented to ED with abnormal labs. Patient was admitted from 5/17 to 5/23 due to MRSA bacteremia that was thought to be secondary to chronic indwelling suprapubic catheter which was treated with IV vancomycin . TTE done on 07/12/2023 showed no vegetations and patient refused TEE. He was discharged and treated with IV daptomycin  daily for about 4 weeks. PICC line was placed prior to discharge at that time.  Per EDP, patient's nephew received a phone call from patient's physician to go to [facility] immediately due to bacteremia, gram-positive cocci in blood.  In ED, vital signs stable, temp 97.4 F, pulse 102, normocytic anemia, sodium 129, creatinine 1.77.  Blood cultures positive for gram-positive cocci.  He was placed on IV vancomycin  then subsequently on IV daptomycin . Patient was found to have right hip abscess, brought to [facility] for incision debridement.  08/28/2023: Patient seen.  No new complaints.  Patient underwent arthrotomy/I&D of right hip for septic arthritis on 08/25/2023.  Patient is currently on IV daptomycin .   Assessment & Plan    GPC bacteremia.  Recurrent MRSA bacteremia. -Recent admission 5/17 to 5/23 with MRSA bacteremia, thought to be secondary to chronic indwelling suprapubic catheter, was treated with IV vancomycin  and completed 4 weeks of IV therapy at home. -2D echo 5/20 had shown EF> 75%, no vegetations, patient had refused TEE -Culture 6/30, with MRSA. -Repeat cultures 7/3, negative  so far. -ID following.  Right hip MRI 6/26 as outpatient with severe destructive changes. I&D, debridement antibiotic placement and biopsy today Dr. Kendal. 08/28/2023: Status post I&D of right hip abscess on 08/25/2023.  Continue IV daptomycin .  Input from the infectious disease team is highly appreciated.   Chronic hyponatremia - Likely multifactorial. - Resolved. - Sodium of 138 today.     Acute kidney injury - Serum creatinine of 1. 55 today.   - Baseline serum creatinine of 1.20. - Current AKI is likely related to the bacteremia.  Cannot entirely rule out volume depletion.  Repeat urinalysis.   Type 2 diabetes mellitus with hyperglycemia -Hemoglobin A1c on 06/23/2023 was 7.0 -Continue sliding scale insulin  while inpatient    Acquired hypothyroidism Continue Synthroid    GERD Continue Protonix    Dementia Continue Aricept    Mixed hyperlipidemia Continue Crestor   Estimated body mass index is 26.32 kg/m (pended) as calculated from the following:   Height as of this encounter: (P) 5' 10 (1.778 m).   Weight as of this encounter: (P) 83.2 kg.  Code Status: Full code.  Consult palliative care team. DVT Prophylaxis:  SCDs Start: 08/25/23 0949 enoxaparin  (LOVENOX )  injection 40 mg Start: 08/24/23 1000 SCDs Start: 08/24/23 9378   Level of Care: Level of care: Med-Surg Family Communication: None today at the bedside. Disposition Plan:      Remains inpatient appropriate: per ID and orthopedics recommendations.  Transfer to [facility].   Procedures:  Right hip I&D.  Consultants:   Infectious disease, Dr. [facility]  Antimicrobials:   Anti-infectives (From admission, onward)    Start     Dose/Rate Route Frequency Ordered Stop   08/25/23 0824  tobramycin  (NEBCIN ) powder  Status:  Discontinued          As needed 08/25/23 0824 08/25/23 0855   08/25/23 0727  vancomycin  (VANCOCIN ) powder  Status:  Discontinued          As needed 08/25/23 0727 08/25/23 0855    08/25/23 0600  vancomycin  (VANCOREADY) IVPB 1250 mg/250 mL  Status:  Discontinued        1,250 mg 166.7 mL/hr over 90 Minutes Intravenous Every 24 hours 08/24/23 0625 08/24/23 1620   08/25/23 0600  ceFAZolin  (ANCEF ) IVPB 2g/100 mL premix        2 g 200 mL/hr over 30 Minutes Intravenous On call to O.R. 08/24/23 1725 08/25/23 0806   08/25/23 0600  vancomycin  (VANCOCIN ) IVPB 1000 mg/200 mL premix        1,000 mg 200 mL/hr over 60 Minutes Intravenous On call to O.R. 08/24/23 1725 08/25/23 0810   08/24/23 1730  DAPTOmycin  (CUBICIN ) IVPB 700 mg/185mL premix        8 mg/kg  83.2 kg 200 mL/hr over 30 Minutes Intravenous Daily 08/24/23 1632     08/24/23 0300  vancomycin  (VANCOREADY) IVPB 2000 mg/400 mL        2,000 mg 200 mL/hr over 120 Minutes Intravenous  Once 08/24/23 0258 08/24/23 0518          Medications  Chlorhexidine  Gluconate Cloth  6 each Topical Q0600   docusate sodium   100 mg Oral BID   donepezil   5 mg Oral Daily   enoxaparin  (LOVENOX ) injection  40 mg Subcutaneous Q24H   insulin  aspart  0-9 Units Subcutaneous TID WC   levothyroxine   75 mcg Oral Daily   mupirocin  ointment   Nasal BID   pantoprazole   40 mg Oral Daily   rosuvastatin   5 mg Oral Weekly      Subjective:   Patient seen. Poor historian.     Objective:   Vitals:   08/28/23 1500 08/28/23 2020 08/29/23 0418 08/29/23 0843  BP: (!) 113/54 130/68 123/67 (!) 103/57  Pulse: 88 97 77 87  Resp: 20 18 18 14   Temp: 98.4 F (36.9 C) 97.6 F (36.4 C) 98.2 F (36.8 C) 97.9 F (36.6 C)  TempSrc:  Oral Oral   SpO2: 96% 100% 93% 96%  Weight:      Height:        Intake/Output Summary (Last 24 hours) at 08/29/2023 1557 Last data filed at 08/29/2023 1353 Gross per 24 hour  Intake 348.28 ml  Output 1790 ml  Net -1441.72 ml     Wt Readings from Last 3 Encounters:  08/25/23 (P) 83.2 kg  08/23/23 90.7 kg  07/17/23 87 kg     General: Chronically ill looking.    Cardiovascular: S1-S2 normal.   . Respiratory: Clear to auscultation. Gastrointestinal: Soft.  Nontender.   Suprapubic catheter present. Ext: No swelling or edema.  Patient has postop dressing right hip looks clean and dry. Neuro: Patient is awake and alert.  Data Reviewed:  I have personally reviewed following labs    CBC Lab Results  Component Value Date   WBC 9.3 08/28/2023   RBC 2.88 (L) 08/28/2023   HGB 7.9 (L) 08/28/2023   HCT 24.6 (L) 08/28/2023   MCV 85.4 08/28/2023   MCH 27.4 08/28/2023   PLT 422 (H) 08/28/2023   MCHC 32.1 08/28/2023   RDW 17.8 (H) 08/28/2023   LYMPHSABS 3.2 08/27/2023   MONOABS 0.7 08/27/2023   EOSABS 0.2 08/27/2023   BASOSABS 0.1 08/27/2023     Last metabolic panel Lab Results  Component Value Date   NA 138 08/28/2023   K 4.3 08/28/2023   CL 106 08/28/2023   CO2 23 08/28/2023   BUN 23 08/28/2023   CREATININE 1.55 (H) 08/28/2023   GLUCOSE 165 (H) 08/28/2023   GFRNONAA 49 (L) 08/28/2023   GFRAA >90 09/20/2012   CALCIUM  9.0 08/28/2023   PHOS 3.7 08/28/2023   PROT 7.6 08/25/2023   ALBUMIN  1.7 (L) 08/28/2023   BILITOT 0.9 08/25/2023   ALKPHOS 88 08/25/2023   AST 21 08/25/2023   ALT 20 08/25/2023   ANIONGAP 9 08/28/2023    CBG (last 3)  Recent Labs    08/28/23 2019 08/29/23 0825 08/29/23 1239  GLUCAP 144* 163* 150*      Coagulation Profile: No results for input(s): INR, PROTIME in the last 168 hours.   Radiology Studies: I have personally reviewed the imaging studies  No results found.      Leatrice LILLETTE Chapel M.D. [facility] 08/29/2023, 3:57 PM  Total time spent: 52 minutes.

## 2023-08-30 ENCOUNTER — Other Ambulatory Visit: Payer: Self-pay

## 2023-08-30 DIAGNOSIS — B9562 Methicillin resistant Staphylococcus aureus infection as the cause of diseases classified elsewhere: Secondary | ICD-10-CM | POA: Diagnosis not present

## 2023-08-30 DIAGNOSIS — R7881 Bacteremia: Secondary | ICD-10-CM | POA: Diagnosis not present

## 2023-08-30 LAB — CULTURE, BLOOD (ROUTINE X 2)
Culture: NO GROWTH
Culture: NO GROWTH
Special Requests: ADEQUATE
Special Requests: ADEQUATE

## 2023-08-30 LAB — GLUCOSE, CAPILLARY
Glucose-Capillary: 137 mg/dL — ABNORMAL HIGH (ref 70–99)
Glucose-Capillary: 150 mg/dL — ABNORMAL HIGH (ref 70–99)
Glucose-Capillary: 119 mg/dL — ABNORMAL HIGH (ref 70–99)
Glucose-Capillary: 121 mg/dL — ABNORMAL HIGH (ref 70–99)

## 2023-08-30 LAB — AEROBIC/ANAEROBIC CULTURE W GRAM STAIN (SURGICAL/DEEP WOUND): Gram Stain: NONE SEEN

## 2023-08-30 NOTE — Progress Notes (Signed)
 Physical Therapy Treatment Patient Details Name: [name] MRN: [id number] DOB: Jul 20, 1955 Today's Date: 08/30/2023   History of Present Illness Patient is 68 y.o. male presented to ED with abnormal labs on 08/24/23. Per EDP, patient's physician instructed pt to go to [facility] immediately due to bacteremia, gram-positive cocci in blood. Pt recently admitted from 5/17 to 5/23 due to MRSA bacteremia thought to be secondary to chronic indwelling suprapubic catheter, treated with IV vancomycin . TTE done on 07/12/2023 showed no vegetations and patient refused TEE. Pt discharged with IV daptomycin  daily for about 4 weeks via PICC. Right hip MRI 6/26 as outpatient with severe destructive changes. Pt now s/p arthrotomy I&D of Rt hip for deep abscess and septic arthritis. PMH significant for dementia, schizophrenia,    PT Comments  Pt mobility limited by cognitive deficits and R LE limited WBing toleranceT. Pt only WBing through toes. At this time patient requiring modA for transfers, is dependent for ADLs, and is unable to ambulate. Pt incontinent of stool during transfer to chair. Pt unaware and dependent for hygiene. Recommend inpatient rehab program < 3 hrs a day. Aware family is declining this and desires to take patient home. Family will need to proved 24/7 assist and pt would benefit from maximal home health services. Acute PT to cont to tofllow.    If plan is discharge home, recommend the following: Two people to help with walking and/or transfers;A lot of help with bathing/dressing/bathroom;Assistance with cooking/housework;Direct supervision/assist for medications management;Assist for transportation;Help with stairs or ramp for entrance;Supervision due to cognitive status   Can travel by private vehicle     No  Equipment Recommendations  None recommended by PT    Recommendations for Other Services       Precautions / Restrictions Precautions Precautions: Fall Recall of  Precautions/Restrictions: Intact Restrictions Weight Bearing Restrictions Per Provider Order: No     Mobility  Bed Mobility Overal bed mobility: Needs Assistance Bed Mobility: Supine to Sit     Supine to sit: Mod assist, HOB elevated, Used rails     General bed mobility comments: max directional verbal cues, mod A for LE mangement to EOB, once tactile cues provided to to use bed rail pt did assist with pulling trunk to EOB    Transfers Overall transfer level: Needs assistance Equipment used: Rolling walker (2 wheels) Transfers: Sit to/from Stand, Bed to chair/wheelchair/BSC Sit to Stand: Mod assist   Step pivot transfers: Mod assist       General transfer comment: pt completed step pvt transfer to recliner wtih modA and max directional verbal cues. Pt with solid but soft stool incontinence, pt unaware, dependent for hygiene. pt stood x3 up to walker with modA but was contact guard to maintain standing during hygiene, pt with R LE WBing through toes only, pt holding R foot in hip/knee flexion    Ambulation/Gait               General Gait Details: unable this date   Stairs             Wheelchair Mobility     Tilt Bed    Modified Rankin (Stroke Patients Only)       Balance Overall balance assessment: Needs assistance Sitting-balance support: No upper extremity supported, Feet supported Sitting balance-Leahy Scale: Good     Standing balance support: Bilateral upper extremity supported, During functional activity Standing balance-Leahy Scale: Poor Standing balance comment: reliant on external support  Communication Communication Communication: Impaired Factors Affecting Communication: Hearing impaired  Cognition Arousal: Alert Behavior During Therapy: Flat affect   PT - Cognitive impairments: History of cognitive impairments, No family/caregiver present to determine baseline                        PT - Cognition Comments: pt with known dementia, only oriented to self, reports i'm scared when trying to stand. no recall to beign in hospital once re-oriented, follows simple commands 75% of time but can not discern R from left Following commands: Impaired Following commands impaired: Follows one step commands with increased time    Cueing Cueing Techniques: Verbal cues, Tactile cues  Exercises      General Comments        Pertinent Vitals/Pain Pain Assessment Pain Assessment: Faces Faces Pain Scale: Hurts little more Pain Location: pt reports all over Pain Descriptors / Indicators: Sore, Grimacing (pt self limiting with R LE WBing) Pain Intervention(s): Monitored during session    Home Living                          Prior Function            PT Goals (current goals can now be found in the care plan section) Acute Rehab PT Goals Patient Stated Goal: none stated PT Goal Formulation: Patient unable to participate in goal setting Time For Goal Achievement: 09/09/23 Potential to Achieve Goals: Good Progress towards PT goals: Progressing toward goals    Frequency    Min 2X/week      PT Plan      Co-evaluation              AM-PAC PT 6 Clicks Mobility   Outcome Measure  Help needed turning from your back to your side while in a flat bed without using bedrails?: A Lot Help needed moving from lying on your back to sitting on the side of a flat bed without using bedrails?: A Lot Help needed moving to and from a bed to a chair (including a wheelchair)?: A Lot Help needed standing up from a chair using your arms (e.g., wheelchair or bedside chair)?: A Lot Help needed to walk in hospital room?: Total Help needed climbing 3-5 steps with a railing? : Total 6 Click Score: 10    End of Session Equipment Utilized During Treatment: Gait belt Activity Tolerance: Patient tolerated treatment well Patient left: in chair;with call bell/phone within  reach;with chair alarm set Nurse Communication: Mobility status PT Visit Diagnosis: Muscle weakness (generalized) (M62.81);Difficulty in walking, not elsewhere classified (R26.2);Other symptoms and signs involving the nervous system (R29.898);Unsteadiness on feet (R26.81);Pain;Other abnormalities of gait and mobility (R26.89);History of falling (Z91.81) Pain - Right/Left: Right Pain - part of body: Hip     Time: 8960-8895 PT Time Calculation (min) (ACUTE ONLY): 25 min  Charges:    $Therapeutic Activity: 23-37 mins PT General Charges $$ ACUTE PT VISIT: 1 Visit                     Norene Ames, PT, DPT Acute Rehabilitation Services Secure chat preferred Office #: [phone]    Norene CHRISTELLA Ames 08/30/2023, 11:22 AM

## 2023-08-30 NOTE — Plan of Care (Signed)

## 2023-08-30 NOTE — Progress Notes (Signed)
 PROGRESS NOTE [name]  [name] DOB: 1955-04-10 DOA: 08/24/2023 PCP: [name], MD  Brief Narrative/Hospital Course:  68 year old male with history of HTN, hypothyroidism, diabetes mellitus, presented to ED with abnormal labs. Patient was admitted from 5/17 to 5/23 due to MRSA bacteremia that was thought to be secondary to chronic indwelling suprapubic catheter which was treated with IV vancomycin . TTE done on 07/12/2023 showed no vegetations and patient refused TEE. He was discharged and treated with IV daptomycin  daily for about 4 weeks. PICC line was placed prior to discharge at that time.  Per EDP, patient's nephew received a phone call from patient's physician to go to [facility] immediately due to bacteremia, gram-positive cocci in blood. In ED, vital signs stable, temp 97.4 F, pulse 102, normocytic anemia, sodium 129, creatinine 1.77.  Blood cultures positive for gram-positive cocci.  He was placed on IV vancomycin  then subsequently on IV daptomycin . Patient was found to have right hip abscess, brought to [facility] for incision debridement.  Subjective: Patient seen and examined  I am dying, please send me to [facility] to live Overnight patient afebrile BP stable on room air. Blood sugar 137 last labs reviewed from 7/6 with creatinine 1.5 hemoglobin 7.9 not much change from previous  Assessment and plan:  Recurrent MRSA bacteremia Right hip septic arthritis: Likely source of current bacteremia and original one 1 may have been hip-underwent I&D and arthrotomy of the right hip with some debridement and placement of vancomycin  tobramycin  powder, followed by ID continue daptomycin  repeat blood cultures without growth so far from admission. Recent admission 5/17 to 5/23 with MRSA bacteremia-thought to be secondary to chronic indwelling suprapubic catheter, was treated with IV vancomycin  and completed 4 weeks of IV therapy at home. TTW 5/20 had shown EF> 75%, no  vegetations, patient had refused TEE If blood culture remains negative on final day #5 ID plans to put PICC line. He will need eventual surgery to address his femur  Chronic hyponatremia Resolved.  Aki: Last serum creatinine of 1. 55 , baseline creat  ~1.2, monitor.  Type 2 diabetes mellitus with hyperglycemia Hemoglobin A1c on 06/23/2023 was 7.0 stable, cont ssi   Acquired hypothyroidism Continue Synthroid    GERD Continue Protonix    Dementia Continue Aricept    Mixed hyperlipidemia Continue Crestor    Deconditioning/debility: Plan for skilled nursing facility   DVT prophylaxis: SCDs Start: 08/25/23 0949 enoxaparin  (LOVENOX ) injection 40 mg Start: 08/24/23 1000 SCDs Start: 08/24/23 9378 Code Status:   Code Status: Full Code Family Communication: plan of care discussed with patient at bedside. Patient status is: Remains hospitalized because of severity of illness Level of care: Med-Surg   Dispo: The patient is from: home            Anticipated disposition: SNF  Objective: Vitals last 24 hrs: Vitals:   08/29/23 1738 08/29/23 2105 08/30/23 0530 08/30/23 0737  BP: 126/65 103/62 124/70 113/66  Pulse: 92 91 97 93  Resp: 16 18 18 18   Temp: 97.9 F (36.6 C) 97.8 F (36.6 C) 97.6 F (36.4 C) 98 F (36.7 C)  TempSrc:  Oral Oral Oral  SpO2: 96% 96% 98% 96%  Weight:      Height:        Physical Examination: General exam: alert awake, older than stated age HEENT:Oral mucosa moist, Ear/Nose WNL grossly Respiratory system: Bilaterally CLEAR BS, no use of accessory muscle Cardiovascular system: S1 & S2 +. Gastrointestinal system: Abdomen soft,  NT,ND,BS+ Nervous System: Alert, awake,  following commands. Extremities: LE edema neg, warm extremities Skin: No rashes,warm. MSK: Normal muscle bulk/tone.  Right leg shorter than right hip with dressing in place  Data Reviewed: I have personally reviewed following labs and imaging studies ( see epic result tab) CBC: Recent  Labs  Lab 08/24/23 0246 08/25/23 0433 08/26/23 0413 08/27/23 0347 08/28/23 0354  WBC 10.1 11.6* 9.6 10.6* 9.3  NEUTROABS 7.1  --   --  6.4  --   HGB 7.7* 7.6* 7.9* 8.0* 7.9*  HCT 25.4* 24.8* 25.4* 25.4* 24.6*  MCV 87.3 85.8 84.7 84.1 85.4  PLT 352 408* 407* 438* 422*   CMP: Recent Labs  Lab 08/24/23 0246 08/25/23 0433 08/26/23 0413 08/27/23 0311 08/27/23 0347 08/28/23 0354  NA 129* 128* 133* 133*  --  138  K 4.2 4.4 4.5 4.3  --  4.3  CL 98 101 105 103  --  106  CO2 21* 19* 21* 22  --  23  GLUCOSE 238* 145* 153* 170*  --  165*  BUN 32* 25* 26* 22  --  23  CREATININE 1.77* 1.69* 1.72* 1.61*  --  1.55*  CALCIUM  8.9 8.7* 8.6* 9.0  --  9.0  MG 1.9  --   --   --  1.9  --   PHOS 3.5  --   --  3.5  --  3.7   GFR: Estimated Creatinine Clearance: 47.8 mL/min (A) (by C-G formula based on SCr of 1.55 mg/dL (H)). Recent Labs  Lab 08/24/23 0246 08/25/23 0433 08/27/23 0311 08/28/23 0354  AST 31 21  --   --   ALT 23 20  --   --   ALKPHOS 107 88  --   --   BILITOT 0.8 0.9  --   --   PROT 8.1 7.6  --   --   ALBUMIN  1.8* 1.5* 1.7* 1.7*   No results for input(s): LIPASE, AMYLASE in the last 168 hours. No results for input(s): AMMONIA in the last 168 hours. Coagulation Profile: No results for input(s): INR, PROTIME in the last 168 hours. Unresulted Labs (From admission, onward)     Start     Ordered   08/31/23 0500  Creatinine, serum  (enoxaparin  (LOVENOX )    CrCl >/= 30 ml/min)  Weekly,   R     Comments: while on enoxaparin  therapy    08/24/23 0622   08/29/23 0816  MIC (1 Drug)-Abscess right hip; 08/25/2023; Abscess; MRSA; Daptomycin   Add-on,   AD       Question Answer Comment  Original Culture Order (Name): Abscess right hip   Collection Date 08/25/2023   Collection Time 8:10 AM   Culture Source Abscess   Organism MRSA   Antibiotic 1: Daptomycin       08/29/23 0816   08/25/23 0500  CBC  Daily,   R      08/24/23 0632   08/25/23 0500  CK  Weekly,   R      08/24/23  1634           Antimicrobials/Microbiology: Anti-infectives (From admission, onward)    Start     Dose/Rate Route Frequency Ordered Stop   08/25/23 0824  tobramycin  (NEBCIN ) powder  Status:  Discontinued          As needed 08/25/23 0824 08/25/23 0855   08/25/23 0727  vancomycin  (VANCOCIN ) powder  Status:  Discontinued          As needed 08/25/23 0727 08/25/23 0855   08/25/23  0600  vancomycin  (VANCOREADY) IVPB 1250 mg/250 mL  Status:  Discontinued        1,250 mg 166.7 mL/hr over 90 Minutes Intravenous Every 24 hours 08/24/23 0625 08/24/23 1620   08/25/23 0600  ceFAZolin  (ANCEF ) IVPB 2g/100 mL premix        2 g 200 mL/hr over 30 Minutes Intravenous On call to O.R. 08/24/23 1725 08/25/23 0806   08/25/23 0600  vancomycin  (VANCOCIN ) IVPB 1000 mg/200 mL premix        1,000 mg 200 mL/hr over 60 Minutes Intravenous On call to O.R. 08/24/23 1725 08/25/23 0810   08/24/23 1730  DAPTOmycin  (CUBICIN ) IVPB 700 mg/154mL premix        8 mg/kg  83.2 kg 200 mL/hr over 30 Minutes Intravenous Daily 08/24/23 1632     08/24/23 0300  vancomycin  (VANCOREADY) IVPB 2000 mg/400 mL        2,000 mg 200 mL/hr over 120 Minutes Intravenous  Once 08/24/23 0258 08/24/23 0518         Component Value Date/Time   SDES BLOOD RIGHT HAND 08/25/2023 1416   SPECREQUEST  08/25/2023 1416    BOTTLES DRAWN AEROBIC ONLY Blood Culture adequate volume   CULT  08/25/2023 1416    NO GROWTH 5 DAYS Performed at [facility], [address] [place], [place] [address]    REPTSTATUS 08/30/2023 FINAL 08/25/2023 1416    Procedures: Procedure(s) (LRB): INCISION AND DRAINAGE OF DEEP ABSCESS, HIP JOINT, ANTERIOR APPROACH (Right) Medications reviewed:  Scheduled Meds:  Chlorhexidine  Gluconate Cloth  6 each Topical Q0600   docusate sodium   100 mg Oral BID   donepezil   5 mg Oral Daily   enoxaparin  (LOVENOX ) injection  40 mg Subcutaneous Q24H   insulin  aspart  0-9 Units Subcutaneous TID WC   levothyroxine   75 mcg Oral  Daily   mupirocin  ointment   Nasal BID   pantoprazole   40 mg Oral Daily   rosuvastatin   5 mg Oral Weekly   Continuous Infusions:  sodium chloride      DAPTOmycin  Stopped (08/29/23 1343)    Mennie LAMY, MD [facility] 08/30/2023, 10:49 AM

## 2023-08-30 NOTE — Progress Notes (Signed)
 Peripherally Inserted Central Catheter Placement  The IV Nurse has discussed with the patient and/or persons authorized to consent for the patient, the purpose of this procedure and the potential benefits and risks involved with this procedure.  The benefits include less needle sticks, lab draws from the catheter, and the patient may be discharged home with the catheter. Risks include, but not limited to, infection, bleeding, blood clot (thrombus formation), and puncture of an artery; nerve damage and irregular heartbeat and possibility to perform a PICC exchange if needed/ordered by physician.  Alternatives to this procedure were also discussed.  Bard Power PICC patient education guide, fact sheet on infection prevention and patient information card has been provided to patient /or left at bedside.    PICC Placement Documentation  PICC Single Lumen 08/30/23 Right Basilic 39 cm 0 cm (Active)  Indication for Insertion or Continuance of Line Prolonged intravenous therapies 08/30/23 2142  Exposed Catheter (cm) 0 cm 08/30/23 2142  Site Assessment Clean, Dry, Intact 08/30/23 2142  Line Status Blood return noted;Flushed;Saline locked 08/30/23 2142  Dressing Type Transparent;Securing device 08/30/23 2142  Dressing Status Antimicrobial disc/dressing in place 08/30/23 2142  Line Care Connections checked and tightened 08/30/23 2142  Line Adjustment (NICU/IV Team Only) No 08/30/23 2142  Dressing Intervention New dressing;Adhesive placed at insertion site (IV team only) 08/30/23 2142  Dressing Change Due 09/06/23 08/30/23 2142       Debby Salomon CROME 08/30/2023, 10:03 PM

## 2023-08-30 NOTE — Hospital Course (Addendum)
 68 year old male with history of HTN, hypothyroidism, diabetes mellitus, presented to ED with abnormal labs. Patient was admitted from 5/17 to 5/23 due to MRSA bacteremia that was thought to be secondary to chronic indwelling suprapubic catheter which was treated with IV vancomycin . TTE done on 07/12/2023 showed no vegetations and patient refused TEE. He was discharged and treated with IV daptomycin  daily for about 4 weeks. PICC line was placed prior to discharge at that time.  Per EDP, patient's nephew received a phone call from patient's physician to go to [facility] immediately due to bacteremia, gram-positive cocci in blood. In ED, vital signs stable, temp 97.4 F, pulse 102, normocytic anemia, sodium 129, creatinine 1.77.  Blood cultures positive for gram-positive cocci.  He was placed on IV vancomycin  then subsequently on IV daptomycin . Patient was found to have right hip abscess, brought to [facility] for incision debridement. Patient underwent debridement repeat blood culture has been negative x 5 days and PICC line placed, SNF was recommended and is being pursued but subsequently family decided to take him home with home health as no SNF option at [facility]  Subjective: Seen and examined No complaints and eager to go home Overnight afebrile BP stable Creatinine down to 1.5  Discharge diagnosis:  Recurrent MRSA bacteremia Right hip septic arthritis s/p I&D of deep abscess hip joints ant approach: Likely source of current bacteremia and original one 1 may have been hip-underwent I&D and arthrotomy of the right hip with some debridement and placement of vancomycin  tobramycin  powder, followed by ID continue daptomycin  repeat blood cultures without growth so far from admission. Recent admission 5/17 to 5/23 with MRSA bacteremia-thought to be secondary to chronic indwelling suprapubic catheter, was treated with IV vancomycin  and completed 4 weeks of IV therapy at home. TTW  5/20 had shown EF> 75%, no vegetations, patient had refused TEE blood culture remained negative on final day #5 and PICC placed He will need eventual surgery to address his femur-will need outpatient follow-up closely, discussed with ID team they are planning to follow-up as outpatient.  Chronic hyponatremia Resolved.  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  He will need outpatient follow-up Recent Labs    07/13/23 0500 07/14/23 1013 08/22/23 1520 08/24/23 0246 08/25/23 0433 08/26/23 0413 08/27/23 0311 08/28/23 0354 08/31/23 0333 09/02/23 0339  BUN 20 22 27* 32* 25* 26* 22 23 33* 28*  CREATININE 1.05 1.20 1.77* 1.77* 1.69* 1.72* 1.61* 1.55* 1.65* 1.57*  CO2 22 20* 22 21* 19* 21* 22 23 23 22   K 3.7 4.2 4.6 4.2 4.4 4.5 4.3 4.3 3.9 4.0    Type 2 diabetes mellitus with hyperglycemia Hemoglobin A1c on 06/23/2023 was 7.0 stable, cont ssi-some home meds upon discharge Recent Labs  Lab 09/01/23 0704 09/01/23 1159 09/01/23 1708 09/01/23 1953 09/02/23 0905  GLUCAP 122* 132* 100* 128* 139*    Acquired hypothyroidism Continue Synthroid    GERD Continue Protonix    Dementia Continue Aricept    Mixed hyperlipidemia Continue Crestor    Deconditioning/debility: Plan for skilled nursing facility as per PT OT but at this time patient and Sister wanted to go home, will plan for home with home health . Discussed with caseworker but at this time sister wants to get patient to SNF and not return home> no bed at [facility] SO sister and patient at this time has decided to go home with home health, home health has been secured along with antibiotics

## 2023-08-30 NOTE — TOC Progression Note (Signed)
 Transition of Care [facility]) - Progression Note    Patient Details  Name: [name] MRN: [id number] Date of Birth: [birth year]  Transition of Care [facility]) CM/SW Contact  Tom-Johnson, Levi Klaiber Daphne, RN Phone Number: 08/30/2023, 2:20 PM  Clinical Narrative:     CM consulted for Home health disciplines. CM spoke with sister, [name] and she requested patient resumes care with [facility]. CM called resumption of care referral to [facility] with acceptance voiced, info on AVS.   Patient not Medically ready for discharge.  CM will continue to follow for discharge needs as patient progresses with care towards discharge.            Expected Discharge Plan and Services                                               Social Determinants of Health (SDOH) Interventions SDOH Screenings   Food Insecurity: No Food Insecurity (08/24/2023)  Housing: Low Risk  (08/24/2023)  Transportation Needs: No Transportation Needs (08/24/2023)  Utilities: Not At Risk (08/24/2023)  Financial Resource Strain: Medium Risk (05/19/2022)   Received from [facility]  Social Connections: Unknown (08/24/2023)  Tobacco Use: Medium Risk (08/24/2023)  Health Literacy: High Risk (02/24/2021)   Received from [facility]    Readmission Risk Interventions    07/14/2023   12:12 PM 07/13/2023    1:18 PM  Readmission Risk Prevention Plan  Transportation Screening Complete Complete  HRI or Home Care Consult Complete Complete  Social Work Consult for Recovery Care Planning/Counseling Complete Complete  Palliative Care Screening Not Applicable Not Applicable  Medication Review Oceanographer) Complete Complete

## 2023-08-31 DIAGNOSIS — B9562 Methicillin resistant Staphylococcus aureus infection as the cause of diseases classified elsewhere: Secondary | ICD-10-CM | POA: Diagnosis not present

## 2023-08-31 DIAGNOSIS — R7881 Bacteremia: Secondary | ICD-10-CM | POA: Diagnosis not present

## 2023-08-31 LAB — GLUCOSE, CAPILLARY
Glucose-Capillary: 163 mg/dL — ABNORMAL HIGH (ref 70–99)
Glucose-Capillary: 127 mg/dL — ABNORMAL HIGH (ref 70–99)
Glucose-Capillary: 137 mg/dL — ABNORMAL HIGH (ref 70–99)
Glucose-Capillary: 132 mg/dL — ABNORMAL HIGH (ref 70–99)

## 2023-08-31 LAB — BASIC METABOLIC PANEL WITH GFR
Anion gap: 7 (ref 5–15)
BUN: 33 mg/dL — ABNORMAL HIGH (ref 8–23)
CO2: 23 mmol/L (ref 22–32)
Calcium: 8.2 mg/dL — ABNORMAL LOW (ref 8.9–10.3)
Chloride: 106 mmol/L (ref 98–111)
Creatinine, Ser: 1.65 mg/dL — ABNORMAL HIGH (ref 0.61–1.24)
GFR, Estimated: 45 mL/min — ABNORMAL LOW (ref >60)
Glucose, Bld: 139 mg/dL — ABNORMAL HIGH (ref 70–99)
Potassium: 3.9 mmol/L (ref 3.5–5.1)
Sodium: 136 mmol/L (ref 135–145)

## 2023-08-31 NOTE — Progress Notes (Signed)
 Occupational Therapy Treatment Patient Details Name: [name] MRN: [id number] DOB: November 27, 1955 Today's Date: 08/31/2023   History of present illness Patient is 68 y.o. male presented to ED with abnormal labs on 08/24/23. Per EDP, patient's physician instructed pt to go to [facility] immediately due to bacteremia, gram-positive cocci in blood. Pt recently admitted from 5/17 to 5/23 due to MRSA bacteremia thought to be secondary to chronic indwelling suprapubic catheter, treated with IV vancomycin . TTE done on 07/12/2023 showed no vegetations and patient refused TEE. Pt discharged with IV daptomycin  daily for about 4 weeks via PICC. Right hip MRI 6/26 as outpatient with severe destructive changes. Pt now s/p arthrotomy I&D of Rt hip for deep abscess and septic arthritis. PMH significant for dementia, schizophrenia,   OT comments  Pt readily willing to work with OT, eager to go home, feeling like his family doesn't want him to come home. Assured him that was not the case, but he did need IV antibiotics for a while. Mod assist for supine to sit, min assist to stabilize walker as pt stood from elevated bed and transferred to chair. Needs multimodal cues to follow commands for hand placement. Completed grooming and UB dressing with set up. Set pt up for lunch once in chair. Pt with significant leg length discrepancy he reports as new.Patient will benefit from continued inpatient follow up therapy, <3 hours/day.      If plan is discharge home, recommend the following:  A little help with walking and/or transfers;A lot of help with bathing/dressing/bathroom;Assistance with cooking/housework;Direct supervision/assist for medications management;Direct supervision/assist for financial management;Assist for transportation;Help with stairs or ramp for entrance   Equipment Recommendations  Other (comment) (defer)    Recommendations for Other Services      Precautions / Restrictions Precautions Precautions:  Fall Restrictions Weight Bearing Restrictions Per Provider Order: No Other Position/Activity Restrictions: significant leg length discrepancy       Mobility Bed Mobility Overal bed mobility: Needs Assistance Bed Mobility: Supine to Sit     Supine to sit: Mod assist, HOB elevated, Used rails          Transfers Overall transfer level: Needs assistance Equipment used: Rolling walker (2 wheels) Transfers: Sit to/from Stand, Bed to chair/wheelchair/BSC Sit to Stand: Min assist, From elevated surface     Step pivot transfers: Contact guard assist     General transfer comment: pt needs maximum, multimodal cues to sequence walker use and still wanting to pull up on walker, stabilized walker as pt stood, guided hand to arm of recliner with good control of descent to chair     Balance Overall balance assessment: Needs assistance   Sitting balance-Leahy Scale: Good     Standing balance support: Bilateral upper extremity supported, During functional activity Standing balance-Leahy Scale: Poor                             ADL either performed or assessed with clinical judgement   ADL Overall ADL's : Needs assistance/impaired Eating/Feeding: Set up;Sitting   Grooming: Wash/dry hands;Wash/dry face;Sitting;Set up           Upper Body Dressing : Set up;Sitting                          Extremity/Trunk Assessment              Vision       Perception     Praxis  Communication Communication Communication: Impaired Factors Affecting Communication: Hearing impaired   Cognition Arousal: Alert Behavior During Therapy: Flat affect Cognition: No family/caregiver present to determine baseline             OT - Cognition Comments: hx of dementia                 Following commands: Impaired Following commands impaired: Follows one step commands with increased time, Follows one step commands inconsistently      Cueing   Cueing  Techniques: Verbal cues, Tactile cues  Exercises      Shoulder Instructions       General Comments      Pertinent Vitals/ Pain       Pain Assessment Pain Assessment: Faces Faces Pain Scale: No hurt  Home Living                                          Prior Functioning/Environment              Frequency  Min 2X/week        Progress Toward Goals  OT Goals(current goals can now be found in the care plan section)  Progress towards OT goals: Progressing toward goals  Acute Rehab OT Goals OT Goal Formulation: With patient Time For Goal Achievement: 09/09/23 Potential to Achieve Goals: Good  Plan      Co-evaluation                 AM-PAC OT 6 Clicks Daily Activity     Outcome Measure   Help from another person eating meals?: None Help from another person taking care of personal grooming?: A Little Help from another person toileting, which includes using toliet, bedpan, or urinal?: Total Help from another person bathing (including washing, rinsing, drying)?: A Lot Help from another person to put on and taking off regular upper body clothing?: A Little Help from another person to put on and taking off regular lower body clothing?: Total 6 Click Score: 14    End of Session Equipment Utilized During Treatment: Gait belt;Rolling walker (2 wheels)  OT Visit Diagnosis: Unsteadiness on feet (R26.81);Muscle weakness (generalized) (M62.81);Other symptoms and signs involving cognitive function   Activity Tolerance Patient tolerated treatment well   Patient Left in chair;with call bell/phone within reach;with chair alarm set   Nurse Communication Mobility status        Time: 8860-8840 OT Time Calculation (min): 20 min  Charges: OT General Charges $OT Visit: 1 Visit OT Treatments $Self Care/Home Management : 8-22 mins  Mliss HERO, OTR/L Acute Rehabilitation Services Office: [phone]   Kennth Mliss Helling 08/31/2023, 2:08 PM

## 2023-08-31 NOTE — Progress Notes (Signed)
 PROGRESS NOTE [name]  FMW:[id number] DOB: 02-20-1956 DOA: 08/24/2023 PCP: [name], MD  Brief Narrative/Hospital Course:  68 year old male with history of HTN, hypothyroidism, diabetes mellitus, presented to ED with abnormal labs. Patient was admitted from 5/17 to 5/23 due to MRSA bacteremia that was thought to be secondary to chronic indwelling suprapubic catheter which was treated with IV vancomycin . TTE done on 07/12/2023 showed no vegetations and patient refused TEE. He was discharged and treated with IV daptomycin  daily for about 4 weeks. PICC line was placed prior to discharge at that time.  Per EDP, patient's nephew received a phone call from patient's physician to go to [facility] immediately due to bacteremia, gram-positive cocci in blood. In ED, vital signs stable, temp 97.4 F, pulse 102, normocytic anemia, sodium 129, creatinine 1.77.  Blood cultures positive for gram-positive cocci.  He was placed on IV vancomycin  then subsequently on IV daptomycin . Patient was found to have right hip abscess, brought to [facility] for incision debridement.  Subjective: Seen and examined, No complaints.  He is forgetful Overnight afebrile BP stable on room air Labs this morning creatinine slightly up 1.6  Assessment and plan:  Recurrent MRSA bacteremia Right hip septic arthritis s/p I&D of deep abscess hip joints ant approach: Likely source of current bacteremia and original one 1 may have been hip-underwent I&D and arthrotomy of the right hip with some debridement and placement of vancomycin  tobramycin  powder, followed by ID continue daptomycin  repeat blood cultures without growth so far from admission. Recent admission 5/17 to 5/23 with MRSA bacteremia-thought to be secondary to chronic indwelling suprapubic catheter, was treated with IV vancomycin  and completed 4 weeks of IV therapy at home. TTW 5/20 had shown EF> 75%, no vegetations, patient had refused TEE blood  culture remained negative on final day #5 and PICC placed He will need eventual surgery to address his femur-will need outpatient follow-up closely, discussed with ID team they are planning to follow-up as outpatient. Cont asa daily x 4 wk for vte prophylaxis.  Chronic hyponatremia Resolved.  Aki: Last serum creatinine of 1. 55 , baseline creat  ~1.2, monitor renal function intermittently encourage oral hydration avoid NSAIDs and nephrotoxic medication.  Type 2 diabetes mellitus with hyperglycemia Hemoglobin A1c on 06/23/2023 was 7.0 stable, cont ssi Recent Labs  Lab 08/30/23 0735 08/30/23 1136 08/30/23 1616 08/30/23 2022 08/31/23 0900  GLUCAP 137* 150* 119* 121* 163*      Acquired hypothyroidism Continue Synthroid    GERD Continue Protonix    Dementia Continue Aricept    Mixed hyperlipidemia Continue Crestor    Deconditioning/debility: Plan for skilled nursing facility as per PT OT but at this time patient and Sister wanted to go home, will plan for home with home health . Discussed with caseworker but at this time sister wants to get patient to SNF and not return home   DVT prophylaxis: SCDs Start: 08/25/23 0949 enoxaparin  (LOVENOX ) injection 40 mg Start: 08/24/23 1000 SCDs Start: 08/24/23 9378 Code Status:   Code Status: Full Code Family Communication: plan of care discussed with patient at bedside. Patient status is: Remains hospitalized because of severity of illness Level of care: Med-Surg   Dispo: The patient is from: home            Anticipated disposition: SNF once bed available.  Patient is medically stable  Objective: Vitals last 24 hrs: Vitals:   08/30/23 1618 08/30/23 2022 08/31/23 0451 08/31/23 0915  BP: 101/69 113/66 (!) 121/58 127/62  Pulse: 73  79 81 80  Resp: 18 18 18    Temp: 98 F (36.7 C) 98.1 F (36.7 C) 98.3 F (36.8 C) (!) 97.5 F (36.4 C)  TempSrc: Oral     SpO2: 96% 99% 97% 97%  Weight:      Height:        Physical  Examination: General exam: alert awake, oriented to self month place, but is forgetful HEENT:Oral mucosa moist, Ear/Nose WNL grossly Respiratory system: Bilaterally clear BS,no use of accessory muscle Cardiovascular system: S1 & S2 +, No JVD. Gastrointestinal system: Abdomen soft,NT,ND, BS+ Nervous System: Alert, awake, moving all extremities,and following commands. Extremities: LE edema neg,distal peripheral pulses palpable and warm.  Skin: No rashes,no icterus. MSK: Normal muscle bulk,tone, power Right hip with dressing in place PICC line placed on the right arm  Data Reviewed: I have personally reviewed following labs and imaging studies ( see epic result tab) CBC: Recent Labs  Lab 08/25/23 0433 08/26/23 0413 08/27/23 0347 08/28/23 0354  WBC 11.6* 9.6 10.6* 9.3  NEUTROABS  --   --  6.4  --   HGB 7.6* 7.9* 8.0* 7.9*  HCT 24.8* 25.4* 25.4* 24.6*  MCV 85.8 84.7 84.1 85.4  PLT 408* 407* 438* 422*   CMP: Recent Labs  Lab 08/25/23 0433 08/26/23 0413 08/27/23 0311 08/27/23 0347 08/28/23 0354 08/31/23 0333  NA 128* 133* 133*  --  138 136  K 4.4 4.5 4.3  --  4.3 3.9  CL 101 105 103  --  106 106  CO2 19* 21* 22  --  23 23  GLUCOSE 145* 153* 170*  --  165* 139*  BUN 25* 26* 22  --  23 33*  CREATININE 1.69* 1.72* 1.61*  --  1.55* 1.65*  CALCIUM  8.7* 8.6* 9.0  --  9.0 8.2*  MG  --   --   --  1.9  --   --   PHOS  --   --  3.5  --  3.7  --    GFR: Estimated Creatinine Clearance: 44.9 mL/min (A) (by C-G formula based on SCr of 1.65 mg/dL (H)). Recent Labs  Lab 08/25/23 0433 08/27/23 0311 08/28/23 0354  AST 21  --   --   ALT 20  --   --   ALKPHOS 88  --   --   BILITOT 0.9  --   --   PROT 7.6  --   --   ALBUMIN  1.5* 1.7* 1.7*   No results for input(s): LIPASE, AMYLASE in the last 168 hours. No results for input(s): AMMONIA in the last 168 hours. Coagulation Profile: No results for input(s): INR, PROTIME in the last 168 hours. Unresulted Labs (From admission,  onward)     Start     Ordered   08/31/23 0500  Creatinine, serum  (enoxaparin  (LOVENOX )    CrCl >/= 30 ml/min)  Weekly,   R     Comments: while on enoxaparin  therapy    08/24/23 0622   08/25/23 0500  CBC  Daily,   R      08/24/23 0632   08/25/23 0500  CK  Weekly,   R      08/24/23 1634           Antimicrobials/Microbiology: Anti-infectives (From admission, onward)    Start     Dose/Rate Route Frequency Ordered Stop   08/25/23 0824  tobramycin  (NEBCIN ) powder  Status:  Discontinued          As needed 08/25/23 9175  08/25/23 0855   08/25/23 0727  vancomycin  (VANCOCIN ) powder  Status:  Discontinued          As needed 08/25/23 0727 08/25/23 0855   08/25/23 0600  vancomycin  (VANCOREADY) IVPB 1250 mg/250 mL  Status:  Discontinued        1,250 mg 166.7 mL/hr over 90 Minutes Intravenous Every 24 hours 08/24/23 0625 08/24/23 1620   08/25/23 0600  ceFAZolin  (ANCEF ) IVPB 2g/100 mL premix        2 g 200 mL/hr over 30 Minutes Intravenous On call to O.R. 08/24/23 1725 08/25/23 0806   08/25/23 0600  vancomycin  (VANCOCIN ) IVPB 1000 mg/200 mL premix        1,000 mg 200 mL/hr over 60 Minutes Intravenous On call to O.R. 08/24/23 1725 08/25/23 0810   08/24/23 1730  DAPTOmycin  (CUBICIN ) IVPB 700 mg/130mL premix        8 mg/kg  83.2 kg 200 mL/hr over 30 Minutes Intravenous Daily 08/24/23 1632     08/24/23 0300  vancomycin  (VANCOREADY) IVPB 2000 mg/400 mL        2,000 mg 200 mL/hr over 120 Minutes Intravenous  Once 08/24/23 0258 08/24/23 0518         Component Value Date/Time   SDES BLOOD RIGHT HAND 08/25/2023 1416   SPECREQUEST  08/25/2023 1416    BOTTLES DRAWN AEROBIC ONLY Blood Culture adequate volume   CULT  08/25/2023 1416    NO GROWTH 5 DAYS Performed at [facility], [address] [place], [place] [address]    REPTSTATUS 08/30/2023 FINAL 08/25/2023 1416    Procedures: Procedure(s) (LRB): INCISION AND DRAINAGE OF DEEP ABSCESS, HIP JOINT, ANTERIOR APPROACH  (Right) Medications reviewed:  Scheduled Meds:  Chlorhexidine  Gluconate Cloth  6 each Topical Q0600   docusate sodium   100 mg Oral BID   donepezil   5 mg Oral Daily   enoxaparin  (LOVENOX ) injection  40 mg Subcutaneous Q24H   insulin  aspart  0-9 Units Subcutaneous TID WC   levothyroxine   75 mcg Oral Daily   mupirocin  ointment   Nasal BID   pantoprazole   40 mg Oral Daily   rosuvastatin   5 mg Oral Weekly   Continuous Infusions:  DAPTOmycin  Stopped (08/30/23 1358)    Mennie LAMY, MD [facility] 08/31/2023, 11:02 AM

## 2023-08-31 NOTE — [place] FL2 (Signed)
 [place]  MEDICAID FL2 LEVEL OF CARE FORM     IDENTIFICATION  Patient Name: [name] Birthdate: March 07, 1955 Sex: male Admission Date (Current Location): 08/24/2023  [facility] and [place] Number:  Producer, television/film/video and Address:  [facility] [place], [address], [place], [place] [address]      Provider Number: [id number]  Attending Physician Name and Address:  Christobal Guadalajara, MD  Relative Name and Phone Number:  Valon, Glasscock Sister   [phone]    Current Level of Care: Hospital Recommended Level of Care: Skilled Nursing Facility Prior Approval Number:    Date Approved/Denied:   PASRR Number: [id number]  Discharge Plan: SNF    Current Diagnoses: Patient Active Problem List   Diagnosis Date Noted   Bacteremia 08/26/2023   Type 2 diabetes mellitus with hyperglycemia (HCC) 08/24/2023   Acquired hypothyroidism 08/24/2023   GERD (gastroesophageal reflux disease) 08/24/2023   Mixed hyperlipidemia 08/24/2023   Acute osteomyelitis involving pelvic region and thigh (HCC) 08/24/2023   Septic arthritis of hip (HCC) 08/24/2023   Myositis 08/24/2023   Vitamin B12 deficiency 08/23/2023   Iron deficiency anemia due to chronic blood loss 08/23/2023   Weakness 07/15/2023   MRSA bacteremia 07/15/2023   Malnutrition of moderate degree 07/12/2023   Failure to thrive in adult 07/09/2023   Metabolic acidosis 07/09/2023   Protein deficiency (HCC) 07/09/2023   Normocytic anemia 07/09/2023   Lactic acidosis 07/09/2023   UTI (urinary tract infection) 06/23/2023   DM (diabetes mellitus) (HCC) 06/23/2023   Dementia without behavioral disturbance (HCC) 11/05/2021   Hypotonic bladder 07/24/2021   Urinary retention 03/27/2021   Constipation 03/27/2021   Acute metabolic encephalopathy 03/18/2021   Hypokalemia 03/18/2021   AKI (acute kidney injury) (HCC) 03/18/2021   Hyponatremia 03/18/2021   Paranoid schizophrenia, chronic condition (HCC) 09/22/2012     Orientation RESPIRATION BLADDER Height & Weight     Self  Normal Incontinent (suprapubic catheter) Weight: (P) 183 lb 6.8 oz (83.2 kg) Height:  (P) 5' 10 (177.8 cm)  BEHAVIORAL SYMPTOMS/MOOD NEUROLOGICAL BOWEL NUTRITION STATUS      Incontinent Diet (see discharge summary)  AMBULATORY STATUS COMMUNICATION OF NEEDS Skin   Total Care Verbally Surgical wounds                       Personal Care Assistance Level of Assistance  Bathing, Feeding, Dressing, Total care Bathing Assistance: Maximum assistance Feeding assistance: Independent Dressing Assistance: Maximum assistance Total Care Assistance: Maximum assistance   Functional Limitations Info             SPECIAL CARE FACTORS FREQUENCY  PT (By licensed PT), OT (By licensed OT)     PT Frequency: 5x week OT Frequency: 5x week            Contractures Contractures Info: Not present    Additional Factors Info  Code Status, Allergies, Insulin  Sliding Scale Code Status Info: full Allergies Info: NKA   Insulin  Sliding Scale Info: Novolog : see discahrge summary       Current Medications (08/31/2023):  This is the current hospital active medication list Current Facility-Administered Medications  Medication Dose Route Frequency Provider Last Rate Last Admin   acetaminophen  (TYLENOL ) tablet 650 mg  650 mg Oral Q6H PRN Danton Lauraine LABOR, PA-C   650 mg at 08/28/23 0440   Or   acetaminophen  (TYLENOL ) suppository 650 mg  650 mg Rectal Q6H PRN Danton Lauraine LABOR, PA-C       Chlorhexidine  Gluconate Cloth  2 % PADS 6 each  6 each Topical Q0600 Danton Lauraine LABOR, PA-C   6 each at 08/31/23 0530   DAPTOmycin  (CUBICIN ) IVPB 700 mg/170mL premix  8 mg/kg Intravenous Q1400 Danton Lauraine LABOR, PA-C   Stopping previously hung infusion at 08/30/23 1358   docusate sodium  (COLACE) capsule 100 mg  100 mg Oral BID Danton Lauraine LABOR, PA-C   100 mg at 08/31/23 9140   donepezil  (ARICEPT ) tablet 5 mg  5 mg Oral Daily Danton Lauraine LABOR, PA-C   5 mg at  08/31/23 9141   enoxaparin  (LOVENOX ) injection 40 mg  40 mg Subcutaneous Q24H Danton Lauraine LABOR, PA-C   40 mg at 08/31/23 9141   insulin  aspart (novoLOG ) injection 0-9 Units  0-9 Units Subcutaneous TID WC Danton Lauraine LABOR, PA-C   1 Units at 08/31/23 1224   levothyroxine  (SYNTHROID ) tablet 75 mcg  75 mcg Oral Daily Danton Lauraine LABOR, PA-C   75 mcg at 08/31/23 9471   methocarbamol  (ROBAXIN ) tablet 500 mg  500 mg Oral Q6H PRN Danton Lauraine LABOR, PA-C   500 mg at 08/28/23 9559   Or   methocarbamol  (ROBAXIN ) injection 500 mg  500 mg Intravenous Q6H PRN Danton Lauraine LABOR, PA-C   500 mg at 08/28/23 1428   metoCLOPramide  (REGLAN ) tablet 5-10 mg  5-10 mg Oral Q8H PRN Danton Lauraine LABOR, PA-C       Or   metoCLOPramide  (REGLAN ) injection 5-10 mg  5-10 mg Intravenous Q8H PRN Danton Lauraine LABOR, PA-C       morphine  (PF) 2 MG/ML injection 0.5 mg  0.5 mg Intravenous Q2H PRN Danton Lauraine LABOR, PA-C   0.5 mg at 08/27/23 1411   mupirocin  ointment (BACTROBAN ) 2 %   Nasal BID Danton Lauraine LABOR, PA-C   Given at 08/31/23 9140   ondansetron  (ZOFRAN ) tablet 4 mg  4 mg Oral Q6H PRN Danton Lauraine LABOR, PA-C       Or   ondansetron  (ZOFRAN ) injection 4 mg  4 mg Intravenous Q6H PRN Danton Lauraine LABOR, PA-C       oxyCODONE  (Oxy IR/ROXICODONE ) immediate release tablet 2.5 mg  2.5 mg Oral Q4H PRN Danton Lauraine LABOR, PA-C   2.5 mg at 08/27/23 9142   pantoprazole  (PROTONIX ) EC tablet 40 mg  40 mg Oral Daily Danton Lauraine LABOR, PA-C   40 mg at 08/31/23 9141   polyethylene glycol (MIRALAX  / GLYCOLAX ) packet 17 g  17 g Oral Daily PRN Danton Lauraine LABOR, PA-C       rosuvastatin  (CRESTOR ) tablet 5 mg  5 mg Oral Weekly Danton Lauraine A, PA-C   5 mg at 08/26/23 9051   sodium chloride  flush (NS) 0.9 % injection 10-40 mL  10-40 mL Intracatheter PRN Danton Lauraine LABOR, PA-C         Discharge Medications: Please see discharge summary for a list of discharge medications.  Relevant Imaging Results:  Relevant Lab Results:   Additional Information SSN:  [id number]. Will require IV abx:Daptomycin  700 mg IV daily  Duration:  6 weeks  End Date:  10/04/2023  Bridget Cordella Simmonds, LCSW

## 2023-08-31 NOTE — TOC Progression Note (Signed)
 Transition of Care [facility]) - Progression Note    Patient Details  Name: [name] MRN: [id number] Date of Birth: 1955-09-17  Transition of Care [facility]) CM/SW Contact  Tom-Johnson, Raylee Adamec Daphne, RN Phone Number: 08/31/2023, 11:02 AM  Clinical Narrative:     CM notified by MD that patient will need to discharge home with IV abx via PICC. CM called and spoke with patient's sister, [name] and she states she is not a teachable person and does not feel safe for patient to go home with [facility] for IV abx. Requests patient go to SNF as was previously recommended by PT/OT.  MD, RN and LCSW notified.    TOC will continue to follow with discharge needs.        Expected Discharge Plan and Services                                   HH Arranged: PT, RN, OT [facility] Agency: [facility] Date [facility] Agency Contacted: 08/30/23 Time HH Agency Contacted: 1415     Social Determinants of Health (SDOH) Interventions SDOH Screenings   Food Insecurity: No Food Insecurity (08/24/2023)  Housing: Low Risk  (08/24/2023)  Transportation Needs: No Transportation Needs (08/24/2023)  Utilities: Not At Risk (08/24/2023)  Financial Resource Strain: Medium Risk (05/19/2022)   Received from [facility]  Social Connections: Unknown (08/24/2023)  Tobacco Use: Medium Risk (08/24/2023)  Health Literacy: High Risk (02/24/2021)   Received from [facility]    Readmission Risk Interventions    07/14/2023   12:12 PM 07/13/2023    1:18 PM  Readmission Risk Prevention Plan  Transportation Screening Complete Complete  HRI or Home Care Consult Complete Complete  Social Work Consult for Recovery Care Planning/Counseling Complete Complete  Palliative Care Screening Not Applicable Not Applicable  Medication Review Oceanographer) Complete Complete

## 2023-08-31 NOTE — Plan of Care (Signed)

## 2023-08-31 NOTE — TOC Progression Note (Addendum)
 Transition of Care [facility]) - Progression Note    Patient Details  Name: [name] MRN: [id number] Date of Birth: [birth year]  Transition [facility] Care [facility]) CM/SW Contact  Bridget Cordella Simmonds, LCSW Phone Number: 08/31/2023, 2:07 PM  Clinical Narrative:   Referral sent out in hub for SNF.    1610: TC pt sister [name], discussed [facility] has offered.  She would like response from [facility].  CSW reached out to [facility] to review.       Expected Discharge Plan and Services                                   HH Arranged: PT, RN, OT [facility] Agency: [facility] Date [facility] Agency Contacted: 08/30/23 Time HH Agency Contacted: 1415     Social Determinants of Health (SDOH) Interventions SDOH Screenings   Food Insecurity: No Food Insecurity (08/24/2023)  Housing: Low Risk  (08/24/2023)  Transportation Needs: No Transportation Needs (08/24/2023)  Utilities: Not At Risk (08/24/2023)  Financial Resource Strain: Medium Risk (05/19/2022)   Received from [facility]  Social Connections: Unknown (08/24/2023)  Tobacco Use: Medium Risk (08/24/2023)  Health Literacy: High Risk (02/24/2021)   Received from [facility]    Readmission Risk Interventions    07/14/2023   12:12 PM 07/13/2023    1:18 PM  Readmission Risk Prevention Plan  Transportation Screening Complete Complete  HRI or Home Care Consult Complete Complete  Social Work Consult for Recovery Care Planning/Counseling Complete Complete  Palliative Care Screening Not Applicable Not Applicable  Medication Review Oceanographer) Complete Complete

## 2023-09-01 ENCOUNTER — Ambulatory Visit

## 2023-09-01 DIAGNOSIS — B9562 Methicillin resistant Staphylococcus aureus infection as the cause of diseases classified elsewhere: Secondary | ICD-10-CM | POA: Diagnosis not present

## 2023-09-01 DIAGNOSIS — R7881 Bacteremia: Secondary | ICD-10-CM | POA: Diagnosis not present

## 2023-09-01 LAB — GLUCOSE, CAPILLARY
Glucose-Capillary: 122 mg/dL — ABNORMAL HIGH (ref 70–99)
Glucose-Capillary: 132 mg/dL — ABNORMAL HIGH (ref 70–99)
Glucose-Capillary: 100 mg/dL — ABNORMAL HIGH (ref 70–99)
Glucose-Capillary: 128 mg/dL — ABNORMAL HIGH (ref 70–99)

## 2023-09-01 LAB — CK: Total CK: 26 U/L — ABNORMAL LOW (ref 49–397)

## 2023-09-01 MED ORDER — DAPTOMYCIN IV (FOR PTA / DISCHARGE USE ONLY): 700.0000 mg | INTRAVENOUS | 0 refills | Status: AC

## 2023-09-01 NOTE — Progress Notes (Signed)
 PROGRESS NOTE [name]  FMW:[id number] DOB: 02/10/1956 DOA: 08/24/2023 PCP: [name], MD  Brief Narrative/Hospital Course:  68 year old male with history of HTN, hypothyroidism, diabetes mellitus, presented to ED with abnormal labs. Patient was admitted from 5/17 to 5/23 due to MRSA bacteremia that was thought to be secondary to chronic indwelling suprapubic catheter which was treated with IV vancomycin . TTE done on 07/12/2023 showed no vegetations and patient refused TEE. He was discharged and treated with IV daptomycin  daily for about 4 weeks. PICC line was placed prior to discharge at that time.  Per EDP, patient's nephew received a phone call from patient's physician to go to [facility] immediately due to bacteremia, gram-positive cocci in blood. In ED, vital signs stable, temp 97.4 F, pulse 102, normocytic anemia, sodium 129, creatinine 1.77.  Blood cultures positive for gram-positive cocci.  He was placed on IV vancomycin  then subsequently on IV daptomycin . Patient was found to have right hip abscess, brought to [facility] for incision debridement.  Subjective: Patient seen and examined Overnight afebrile BP 90s-113  Remains on daptomycin  CK in 20s  Assessment and plan:  Recurrent MRSA bacteremia Right hip septic arthritis s/p I&D of deep abscess hip joints ant approach: Likely source of current bacteremia and original one 1 may have been hip-underwent I&D and arthrotomy of the right hip with some debridement and placement of vancomycin  tobramycin  powder, followed by ID continue daptomycin  repeat blood cultures without growth so far from admission. Recent admission 5/17 to 5/23 with MRSA bacteremia-thought to be secondary to chronic indwelling suprapubic catheter, was treated with IV vancomycin  and completed 4 weeks of IV therapy at home. TTW 5/20 had shown EF> 75%, no vegetations, patient had refused TEE blood culture remained negative on final day #5 and PICC  placed He will need eventual surgery to address his femur-will need outpatient follow-up closely, discussed with ID team they are planning to follow-up as outpatient. Cont asa daily x 4 wk for vte prophylaxis.  Chronic hyponatremia Resolved.  Aki: Last serum creatinine of 1. 55 , baseline creat  ~1.2, monitor renal function intermittently encourage oral hydration avoid NSAIDs and nephrotoxic medication.  Type 2 diabetes mellitus with hyperglycemia Hemoglobin A1c on 06/23/2023 was 7.0 stable, cont ssi Recent Labs  Lab 08/31/23 0900 08/31/23 1145 08/31/23 1739 08/31/23 1956 09/01/23 0704  GLUCAP 163* 127* 137* 132* 122*      Acquired hypothyroidism Continue Synthroid    GERD Continue Protonix    Dementia Continue Aricept    Mixed hyperlipidemia Continue Crestor    Deconditioning/debility: Plan for skilled nursing facility as per PT OT but at this time patient and Sister wanted to go home, will plan for home with home health . Discussed with caseworker but at this time sister wants to get patient to SNF and not return home   DVT prophylaxis: SCDs Start: 08/25/23 0949 enoxaparin  (LOVENOX ) injection 40 mg Start: 08/24/23 1000 SCDs Start: 08/24/23 9378 Code Status:   Code Status: Full Code Family Communication: plan of care discussed with patient at bedside.  Patient's sister was called and updated previously Patient status is: Remains hospitalized because of severity of illness Level of care: Med-Surg   Dispo: The patient is from: home            Anticipated disposition: SNF once bed available.  Patient is medically stable  Objective: Vitals last 24 hrs: Vitals:   08/31/23 1704 08/31/23 1953 09/01/23 0514 09/01/23 0702  BP: 119/67 (!) 109/55 (!) 113/54 (!) 93/36  Pulse: 82 70 82 83  Resp: 17 18 18 18   Temp: 98 F (36.7 C) (!) 97.4 F (36.3 C) 97.7 F (36.5 C) 97.7 F (36.5 C)  TempSrc: Oral  Oral Oral  SpO2: 99% 100% 100% 99%  Weight:      Height:        Physical  Examination: General exam: alert awake, oriented to self place  HEENT:Oral mucosa moist, Ear/Nose WNL grossly Respiratory system: Bilaterally clear BS,no use of accessory muscle Cardiovascular system: S1 & S2 +, No JVD. Gastrointestinal system: Abdomen soft,NT,ND, BS+ Nervous System: Alert, awake, moving all extremities,and following commands. Extremities: LE edema neg,distal peripheral pulses palpable and warm.  Skin: No rashes,no icterus. MSK: Normal muscle bulk,tone, power Right hip with dressing in place PICC line on the right arm  Data Reviewed: I have personally reviewed following labs and imaging studies ( see epic result tab) CBC: Recent Labs  Lab 08/26/23 0413 08/27/23 0347 08/28/23 0354  WBC 9.6 10.6* 9.3  NEUTROABS  --  6.4  --   HGB 7.9* 8.0* 7.9*  HCT 25.4* 25.4* 24.6*  MCV 84.7 84.1 85.4  PLT 407* 438* 422*   CMP: Recent Labs  Lab 08/26/23 0413 08/27/23 0311 08/27/23 0347 08/28/23 0354 08/31/23 0333  NA 133* 133*  --  138 136  K 4.5 4.3  --  4.3 3.9  CL 105 103  --  106 106  CO2 21* 22  --  23 23  GLUCOSE 153* 170*  --  165* 139*  BUN 26* 22  --  23 33*  CREATININE 1.72* 1.61*  --  1.55* 1.65*  CALCIUM  8.6* 9.0  --  9.0 8.2*  MG  --   --  1.9  --   --   PHOS  --  3.5  --  3.7  --    GFR: Estimated Creatinine Clearance: 44.9 mL/min (A) (by C-G formula based on SCr of 1.65 mg/dL (H)). Recent Labs  Lab 08/27/23 0311 08/28/23 0354  ALBUMIN  1.7* 1.7*   No results for input(s): LIPASE, AMYLASE in the last 168 hours. No results for input(s): AMMONIA in the last 168 hours. Coagulation Profile: No results for input(s): INR, PROTIME in the last 168 hours. Unresulted Labs (From admission, onward)     Start     Ordered   08/31/23 0500  Creatinine, serum  (enoxaparin  (LOVENOX )    CrCl >/= 30 ml/min)  Weekly,   R     Comments: while on enoxaparin  therapy    08/24/23 0622   08/25/23 0500  CBC  Daily,   R      08/24/23 0632   08/25/23 0500  CK   Weekly,   R      08/24/23 1634           Antimicrobials/Microbiology: Anti-infectives (From admission, onward)    Start     Dose/Rate Route Frequency Ordered Stop   08/25/23 0824  tobramycin  (NEBCIN ) powder  Status:  Discontinued          As needed 08/25/23 0824 08/25/23 0855   08/25/23 0727  vancomycin  (VANCOCIN ) powder  Status:  Discontinued          As needed 08/25/23 0727 08/25/23 0855   08/25/23 0600  vancomycin  (VANCOREADY) IVPB 1250 mg/250 mL  Status:  Discontinued        1,250 mg 166.7 mL/hr over 90 Minutes Intravenous Every 24 hours 08/24/23 0625 08/24/23 1620   08/25/23 0600  ceFAZolin  (ANCEF ) IVPB 2g/100 mL  premix        2 g 200 mL/hr over 30 Minutes Intravenous On call to O.R. 08/24/23 1725 08/25/23 0806   08/25/23 0600  vancomycin  (VANCOCIN ) IVPB 1000 mg/200 mL premix        1,000 mg 200 mL/hr over 60 Minutes Intravenous On call to O.R. 08/24/23 1725 08/25/23 0810   08/24/23 1730  DAPTOmycin  (CUBICIN ) IVPB 700 mg/148mL premix        8 mg/kg  83.2 kg 200 mL/hr over 30 Minutes Intravenous Daily 08/24/23 1632     08/24/23 0300  vancomycin  (VANCOREADY) IVPB 2000 mg/400 mL        2,000 mg 200 mL/hr over 120 Minutes Intravenous  Once 08/24/23 0258 08/24/23 0518         Component Value Date/Time   SDES BLOOD RIGHT HAND 08/25/2023 1416   SPECREQUEST  08/25/2023 1416    BOTTLES DRAWN AEROBIC ONLY Blood Culture adequate volume   CULT  08/25/2023 1416    NO GROWTH 5 DAYS Performed at [facility], [address] [place], [place] [address]    REPTSTATUS 08/30/2023 FINAL 08/25/2023 1416    Procedures: Procedure(s) (LRB): INCISION AND DRAINAGE OF DEEP ABSCESS, HIP JOINT, ANTERIOR APPROACH (Right) Medications reviewed:  Scheduled Meds:  Chlorhexidine  Gluconate Cloth  6 each Topical Q0600   docusate sodium   100 mg Oral BID   donepezil   5 mg Oral Daily   enoxaparin  (LOVENOX ) injection  40 mg Subcutaneous Q24H   insulin  aspart  0-9 Units Subcutaneous TID WC    levothyroxine   75 mcg Oral Daily   mupirocin  ointment   Nasal BID   pantoprazole   40 mg Oral Daily   rosuvastatin   5 mg Oral Weekly   Continuous Infusions:  DAPTOmycin  Stopped (08/31/23 1451)    Mennie LAMY, MD [facility] 09/01/2023, 11:43 AM

## 2023-09-01 NOTE — TOC Progression Note (Signed)
 Transition of Care [facility]) - Progression Note    Patient Details  Name: [name] MRN: [id number] Date of Birth: [birth year]  Transition of Care [facility]) CM/SW Contact  Tom-Johnson, Ersa Delaney Daphne, RN Phone Number: 09/01/2023, 3:54 PM  Clinical Narrative:     CM notified patient will be discharging home with IV abx. Patient's nephew's wife, [name] agreed to be teachable and trained.  CM notified Shylise with [facility] accepted RN for home IV abx.  CM called in referral to [facility] and Pam voiced acceptance, info on AVS.  Plan for discharge home tomorrow.   CM will continue to follow for discharge needs as patient progresses with care towards discharge.            Expected Discharge Plan and Services                                   HH Arranged: PT, RN, OT [facility] Agency: [facility] Date [facility] Agency Contacted: 08/30/23 Time HH Agency Contacted: 1415     Social Determinants of Health (SDOH) Interventions SDOH Screenings   Food Insecurity: No Food Insecurity (08/24/2023)  Housing: Low Risk  (08/24/2023)  Transportation Needs: No Transportation Needs (08/24/2023)  Utilities: Not At Risk (08/24/2023)  Financial Resource Strain: Medium Risk (05/19/2022)   Received from [facility]  Social Connections: Unknown (08/24/2023)  Tobacco Use: Medium Risk (08/24/2023)  Health Literacy: High Risk (02/24/2021)   [facility]    Readmission Risk Interventions    07/14/2023   12:12 PM 07/13/2023    1:18 PM  Readmission Risk Prevention Plan  Transportation Screening Complete Complete  HRI or Home Care Consult Complete Complete  Social Work Consult for Recovery Care Planning/Counseling Complete Complete  Palliative Care Screening Not Applicable Not Applicable  Medication Review Oceanographer) Complete Complete

## 2023-09-01 NOTE — Progress Notes (Signed)
 Physical Therapy Treatment Patient Details Name: [name] MRN: [id number] DOB: [birth year] Today's Date: 09/01/2023   History of Present Illness Patient is 68 y.o. male presented to ED with abnormal labs on 08/24/23. Per EDP, patient's physician instructed pt to go to [facility] immediately due to bacteremia, gram-positive cocci in blood. Pt recently admitted from 5/17 to 5/23 due to MRSA bacteremia thought to be secondary to chronic indwelling suprapubic catheter, treated with IV vancomycin . TTE done on 07/12/2023 showed no vegetations and patient refused TEE. Pt discharged with IV daptomycin  daily for about 4 weeks via PICC. Right hip MRI 6/26 as outpatient with severe destructive changes. Pt now s/p arthrotomy I&D of Rt hip for deep abscess and septic arthritis. PMH significant for dementia, schizophrenia,    PT Comments  Pt agreeable to participate but declining transfer to chair today. Performed bed level RLE AAROM exercises and transfer training. Pt requiring min-mod assist for functional mobility. TotalA for posterior peri care in standing position. Patient will benefit from continued inpatient follow up therapy, <3 hours/day.   If plan is discharge home, recommend the following: A lot of help with bathing/dressing/bathroom;Assistance with cooking/housework;Direct supervision/assist for medications management;Assist for transportation;Help with stairs or ramp for entrance;Supervision due to cognitive status;A lot of help with walking and/or transfers   Can travel by private vehicle     No  Equipment Recommendations  None recommended by PT    Recommendations for Other Services       Precautions / Restrictions Precautions Precautions: Fall Recall of Precautions/Restrictions: Impaired Restrictions Weight Bearing Restrictions Per Provider Order: No Other Position/Activity Restrictions: significant leg length discrepancy     Mobility  Bed Mobility Overal bed mobility: Needs  Assistance Bed Mobility: Supine to Sit, Sit to Supine     Supine to sit: Mod assist Sit to supine: Mod assist   General bed mobility comments: Pt with decreased initiation overall, requires assist to get BLE's moving, hand over hand guidance for use of rail.    Transfers Overall transfer level: Needs assistance Equipment used: Rolling walker (2 wheels) Transfers: Sit to/from Stand Sit to Stand: Min assist           General transfer comment: Consistent verbal cueing for hand placement, minA to power up to standing    Ambulation/Gait                   Stairs             Wheelchair Mobility     Tilt Bed    Modified Rankin (Stroke Patients Only)       Balance Overall balance assessment: Needs assistance Sitting-balance support: No upper extremity supported, Feet supported Sitting balance-Leahy Scale: Good     Standing balance support: Bilateral upper extremity supported, During functional activity Standing balance-Leahy Scale: Poor Standing balance comment: reliant on external support                            Communication Communication Communication: Impaired Factors Affecting Communication: Hearing impaired  Cognition Arousal: Alert Behavior During Therapy: Flat affect   PT - Cognitive impairments: History of cognitive impairments, No family/caregiver present to determine baseline                         Following commands: Impaired Following commands impaired: Follows one step commands with increased time, Follows one step commands inconsistently    Cueing Cueing Techniques: Verbal cues,  Tactile cues  Exercises      General Comments        Pertinent Vitals/Pain Pain Assessment Pain Assessment: Faces Faces Pain Scale: Hurts little more Pain Location: R hip with movement Pain Descriptors / Indicators: Discomfort, Sore Pain Intervention(s): Monitored during session    Home Living                           Prior Function            PT Goals (current goals can now be found in the care plan section) Acute Rehab PT Goals Patient Stated Goal: none stated Potential to Achieve Goals: Fair    Frequency    Min 2X/week      PT Plan      Co-evaluation              AM-PAC PT 6 Clicks Mobility   Outcome Measure  Help needed turning from your back to your side while in a flat bed without using bedrails?: A Lot Help needed moving from lying on your back to sitting on the side of a flat bed without using bedrails?: A Lot Help needed moving to and from a bed to a chair (including a wheelchair)?: A Little Help needed standing up from a chair using your arms (e.g., wheelchair or bedside chair)?: A Little Help needed to walk in hospital room?: Total Help needed climbing 3-5 steps with a railing? : Total 6 Click Score: 12    End of Session Equipment Utilized During Treatment: Gait belt Activity Tolerance: Patient tolerated treatment well Patient left: in bed;with call bell/phone within reach;with bed alarm set Nurse Communication: Mobility status PT Visit Diagnosis: Muscle weakness (generalized) (M62.81);Difficulty in walking, not elsewhere classified (R26.2);Other symptoms and signs involving the nervous system (R29.898);Unsteadiness on feet (R26.81);Pain;Other abnormalities of gait and mobility (R26.89);History of falling (Z91.81) Pain - Right/Left: Right Pain - part of body: Hip     Time: 8971-8942 PT Time Calculation (min) (ACUTE ONLY): 29 min  Charges:    $Therapeutic Activity: 23-37 mins PT General Charges $$ ACUTE PT VISIT: 1 Visit                     Aleck Huerta, PT, DPT Acute Rehabilitation Services Office [phone]    [name] 09/01/2023, 1:44 PM

## 2023-09-01 NOTE — TOC Progression Note (Signed)
 Transition of Care [facility]) - Progression Note    Patient Details  Name: [name] MRN: [id number] Date of Birth: March 02, 1955  [facility] CM/SW Contact  Bridget Cordella Simmonds, LCSW Phone Number: 09/01/2023, 3:09 PM  Clinical Narrative:   [facility] unable to offer bed. CSW spoke with pt sister [name], discussed bed offers at [facility], [place] [facility].  [name] is not willing to accept offer at either of these SNF.  If pt cannot go to [facility], then she does want to have pt DC home.  Pt lives with his nephew [name] and Paul's wife [name] and they can assist with the IV abx at home.  CSW spoke with [name], [phone], and she confirmed that pt lives with them and she can assist with IV abx.  She reports pt was on IV abx before and she assisted then as well.  She is available to come to [facility] for teaching as needed.  Team updated.          Expected Discharge Plan and Services                                   HH Arranged: PT, RN, OT [facility] Agency: [facility] Date [facility] Agency Contacted: 08/30/23 Time HH Agency Contacted: [phone]     Social Determinants of Health (SDOH) Interventions SDOH Screenings   Food Insecurity: No Food Insecurity (08/24/2023)  Housing: Low Risk  (08/24/2023)  Transportation Needs: No Transportation Needs (08/24/2023)  Utilities: Not At Risk (08/24/2023)  Financial Resource Strain: Medium Risk (05/19/2022)   Received from [facility]  Social Connections: Unknown (08/24/2023)  Tobacco Use: Medium Risk (08/24/2023)  Health Literacy: High Risk (02/24/2021)   Received from [facility]    Readmission Risk Interventions    07/14/2023   12:12 PM 07/13/2023    1:18 PM  Readmission Risk Prevention Plan  Transportation Screening Complete Complete  HRI or Home Care Consult Complete Complete  Social Work Consult for Recovery Care Planning/Counseling Complete Complete  Palliative Care Screening Not Applicable Not  Applicable  Medication Review [employer]) Complete Complete

## 2023-09-01 NOTE — Plan of Care (Signed)

## 2023-09-02 DIAGNOSIS — R7881 Bacteremia: Secondary | ICD-10-CM | POA: Diagnosis not present

## 2023-09-02 DIAGNOSIS — B9562 Methicillin resistant Staphylococcus aureus infection as the cause of diseases classified elsewhere: Secondary | ICD-10-CM | POA: Diagnosis not present

## 2023-09-02 LAB — BASIC METABOLIC PANEL WITH GFR
Anion gap: 9 (ref 5–15)
BUN: 28 mg/dL — ABNORMAL HIGH (ref 8–23)
CO2: 22 mmol/L (ref 22–32)
Calcium: 8.4 mg/dL — ABNORMAL LOW (ref 8.9–10.3)
Chloride: 107 mmol/L (ref 98–111)
Creatinine, Ser: 1.57 mg/dL — ABNORMAL HIGH (ref 0.61–1.24)
GFR, Estimated: 48 mL/min — ABNORMAL LOW (ref >60)
Glucose, Bld: 163 mg/dL — ABNORMAL HIGH (ref 70–99)
Potassium: 4 mmol/L (ref 3.5–5.1)
Sodium: 138 mmol/L (ref 135–145)

## 2023-09-02 LAB — GLUCOSE, CAPILLARY
Glucose-Capillary: 139 mg/dL — ABNORMAL HIGH (ref 70–99)
Glucose-Capillary: 146 mg/dL — ABNORMAL HIGH (ref 70–99)

## 2023-09-02 NOTE — Plan of Care (Signed)
   Problem: Education: Goal: Knowledge of General Education information will improve Description: Including pain rating scale, medication(s)/side effects and non-pharmacologic comfort measures Outcome: Progressing   Problem: Nutrition: Goal: Adequate nutrition will be maintained Outcome: Progressing

## 2023-09-02 NOTE — Discharge Summary (Signed)
 Physician Discharge Summary  [name] FMW:[id number] DOB: 1955/06/29 DOA: 08/24/2023  PCP: [name], MD  Admit date: 08/24/2023 Discharge date: 09/02/2023 Recommendations for Outpatient Follow-up:  Follow up with PCP in 1 weeks-call for appointment Please obtain BMP/CBC in one week Follow-up with orthopedics Dr. Kendal  Discharge Dispo: Home with home health Discharge Condition: Stable Code Status:   Code Status: Full Code Diet recommendation:  Diet Order             Diet - low sodium heart healthy           Diet Carb Modified Fluid consistency: Thin; Room service appropriate? Yes  Diet effective now                   Brief/Interim Summary:  68 year old male with history of HTN, hypothyroidism, diabetes mellitus, presented to ED with abnormal labs. Patient was admitted from 5/17 to 5/23 due to MRSA bacteremia that was thought to be secondary to chronic indwelling suprapubic catheter which was treated with IV vancomycin . TTE done on 07/12/2023 showed no vegetations and patient refused TEE. He was discharged and treated with IV daptomycin  daily for about 4 weeks. PICC line was placed prior to discharge at that time.  Per EDP, patient's nephew received a phone call from patient's physician to go to [facility] immediately due to bacteremia, gram-positive cocci in blood. In ED, vital signs stable, temp 97.4 F, pulse 102, normocytic anemia, sodium 129, creatinine 1.77.  Blood cultures positive for gram-positive cocci.  He was placed on IV vancomycin  then subsequently on IV daptomycin . Patient was found to have right hip abscess, brought to [facility] for incision debridement. 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 Recent admission 5/17 to 5/23 with MRSA bacteremia-thought to be secondary to chronic indwelling suprapubic catheter, was treated with IV vancomycin  and completed 4 weeks of IV therapy at home. TTW 5/20 had shown EF> 75%, no vegetations, patient had refused TEE blood culture remained negative on final day #5 and PICC placed He will need eventual surgery to address his femur-will need outpatient follow-up closely, discussed with ID team they are planning to follow-up as outpatient.  Chronic hyponatremia Resolved.  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  He will need outpatient follow-up Recent Labs    07/13/23 0500 07/14/23 1013 08/22/23 1520 08/24/23 0246 08/25/23 0433 08/26/23 0413 08/27/23 0311 08/28/23 0354 08/31/23 0333 09/02/23 0339  BUN 20 22 27* 32* 25* 26* 22 23 33* 28*  CREATININE 1.05 1.20 1.77* 1.77* 1.69* 1.72* 1.61* 1.55* 1.65* 1.57*  CO2 22 20* 22 21* 19* 21* 22 23 23 22   K 3.7 4.2 4.6 4.2 4.4 4.5 4.3 4.3 3.9 4.0    Type 2 diabetes mellitus with hyperglycemia Hemoglobin A1c  on 06/23/2023 was 7.0 stable, cont ssi-some home meds upon discharge Recent Labs  Lab 09/01/23 0704 09/01/23 1159 09/01/23 1708 09/01/23 1953 09/02/23 0905  GLUCAP 122* 132* 100* 128* 139*    Acquired hypothyroidism Continue Synthroid    GERD Continue Protonix    Dementia Continue Aricept    Mixed hyperlipidemia Continue Crestor    Deconditioning/debility: Plan for skilled nursing  facility as per PT OT but at this time patient and Sister wanted to go home, will plan for home with home health . Discussed with caseworker but at this time sister wants to get patient to SNF and not return home> no bed at [facility] SO sister and patient at this time has decided to go home with home health, home health has been secured along with antibiotics   Consults: Dr. Kendal Infectious disease  Procedure(s) (LRB): INCISION AND DRAINAGE OF DEEP ABSCESS, HIP JOINT, ANTERIOR APPROACH (Right)   Discharge Exam: Vitals:   09/02/23 0513 09/02/23 0905  BP: 125/63 96/70  Pulse: 78 81  Resp: 17 19  Temp: 97.6 F (36.4 C) 98.2 F (36.8 C)  SpO2: 97% 100%   General: Pt is alert, awake, not in acute distress Cardiovascular: RRR, S1/S2 +, no rubs, no gallops Respiratory: CTA bilaterally, no wheezing, no rhonchi Abdominal: Soft, NT, ND, bowel sounds + Extremities: no edema, no cyanosis  Discharge Instructions  Discharge Instructions     [facility] pharmacist to adjust dose for Vancomycin , Aminoglycosides and other anti-infective therapies as requested by physician.   Complete by: As directed    [facility] to provide Cath Flo 2mg    Complete by: As directed    Administer for PICC line occlusion and as ordered by physician for other access device issues.   Anaphylaxis Kit: Provided to treat any anaphylactic reaction to the medication being provided to the patient if First Dose or when requested by physician   Complete by: As directed    Epinephrine 1mg /ml vial / amp: Administer 0.3mg  (0.8ml) subcutaneously once for moderate to severe anaphylaxis, nurse to call physician and pharmacy when reaction occurs and call 911 if needed for immediate care   Diphenhydramine  50mg /ml IV vial: Administer 25-50mg  IV/IM PRN for first dose reaction, rash, itching, mild reaction, nurse to call physician and pharmacy when reaction occurs   Sodium Chloride  0.9% NS 500ml IV:  Administer if needed for hypovolemic blood pressure drop or as ordered by physician after call to physician with anaphylactic reaction   Change dressing on IV access line weekly and PRN   Complete by: As directed    Diet - low sodium heart healthy   Complete by: As directed    Discharge instructions   Complete by: As directed    Please call call MD or return to ER for similar or worsening recurring problem that brought you to hospital or if any fever,nausea/vomiting,abdominal pain, uncontrolled pain, chest pain,  shortness of breath or any other alarming symptoms.  YOU WILL NEED TO FOLLOW UP WITH Dr Fleeta Rothman, ID clinic as scheduled in few weeks  Please follow-up your doctor as instructed in a week time and call the office for appointment.  Please avoid alcohol , smoking, or any other illicit substance and maintain healthy habits including taking your regular medications as prescribed.  You were cared for by a hospitalist during your hospital stay. If you have any questions about your discharge medications or the care you received while you were in the hospital after you are discharged, you can call the  unit and ask to speak with the hospitalist on call if the hospitalist that took care of you is not available.  Once you are discharged, your primary care physician will handle any further medical issues. Please note that NO REFILLS for any discharge medications will be authorized once you are discharged, as it is imperative that you return to your primary care physician (or establish a relationship with a primary care physician if you do not have one) for your aftercare needs so that they can reassess your need for medications and monitor your lab values   Discharge wound care:   Complete by: As directed    Reinforce dressing   Flush IV access with Sodium Chloride  0.9% and Heparin  10 units/ml or 100 units/ml   Complete by: As directed    Home infusion instructions - [facility]    Complete by: As directed    Instructions: Flush IV access with Sodium Chloride  0.9% and Heparin  10units/ml or 100units/ml   Change dressing on IV access line: Weekly and PRN   Instructions Cath Flo 2mg : Administer for PICC Line occlusion and as ordered by physician for other access device   [facility] pharmacist to adjust dose for: Vancomycin , Aminoglycosides and other anti-infective therapies as requested by physician   Method of administration may be changed at the discretion of home infusion pharmacist based upon assessment of the patient and/or caregiver's ability to self-administer the medication ordered   Complete by: As directed       Allergies as of 09/02/2023   No Known Allergies      Medication List     STOP taking these medications    DAPTOmycin  500 MG injection Commonly known as: CUBICIN  Replaced by: daptomycin  IVPB   HYDROcodone -acetaminophen  5-325 MG tablet Commonly known as: NORCO/VICODIN   linezolid  600 MG tablet Commonly known as: ZYVOX    QUEtiapine  200 MG tablet Commonly known as: SEROQUEL        TAKE these medications    aspirin  EC 81 MG tablet Take 1 tablet (81 mg total) by mouth 2 (two) times daily for 28 days. Swallow whole.   benztropine  2 MG tablet Commonly known as: COGENTIN  Take 1 tablet (2 mg total) by mouth at bedtime.   chlorhexidine  4 % external liquid Commonly known as: HIBICLENS  Apply 15 mLs (1 Application total) topically as directed for 30 doses. Use as directed daily for 5 days every other week for 6 weeks.   daptomycin  IVPB Commonly known as: CUBICIN  Inject 700 mg into the vein daily. Indication:  MRSA Bacteremia/septic hip/osteomyelitis of pelvis  First Dose: Yes Last Day of Therapy:  10/04/23 Labs - Once weekly:  CBC/D, BMP, and CPK Labs - Once weekly: ESR and CRP Method of administration: IV Push Method of administration may be changed at the discretion of home infusion pharmacist based upon assessment of the  patient and/or caregiver's ability to self-administer the medication ordered. Replaces: DAPTOmycin  500 MG injection   donepezil  5 MG tablet Commonly known as: ARICEPT  Take 5 mg by mouth daily.   levothyroxine  75 MCG tablet Commonly known as: SYNTHROID  Take 1 tablet (75 mcg total) by mouth daily.   meloxicam 15 MG tablet Commonly known as: MOBIC Take 15 mg by mouth daily.   metFORMIN 500 MG 24 hr tablet Commonly known as: GLUCOPHAGE-XR Take 500 mg by mouth daily.   mupirocin  ointment 2 % Commonly known as: BACTROBAN  Place 1 Application into the nose 2 (two) times daily for 60 doses. Use as directed  2 times daily for 5 days every other week for 6 weeks.   omeprazole  20 MG capsule Commonly known as: PRILOSEC Take 1 capsule (20 mg total) by mouth daily.   oxybutynin  5 MG tablet Commonly known as: DITROPAN  Take 1 tablet (5 mg total) by mouth 3 (three) times daily.   paliperidone  3 MG 24 hr tablet Commonly known as: INVEGA  Take 1 tablet by mouth 2 (two) times daily.   polyethylene glycol 17 g packet Commonly known as: MIRALAX  / GLYCOLAX  Take 17 g by mouth 2 (two) times daily.   rosuvastatin  5 MG tablet Commonly known as: CRESTOR  Take 1 tablet (5 mg total) by mouth once a week. Take 5 mg by mouth every week on Friday (please hold medication until August 16, 2023 to minimize risk of rhabdomyolysis while using daptomycin ).       ASK your doctor about these medications    HYDROcodone -acetaminophen  5-325 MG tablet Commonly known as: NORCO/VICODIN Take 1 tablet by mouth every 6 (six) hours as needed for up to 5 days for moderate pain (pain score 4-6). Ask about: Should I take this medication?               Discharge Care Instructions  (From admission, onward)           Start     Ordered   09/01/23 0000  Change dressing on IV access line weekly and PRN  (Home infusion instructions - [facility] )        09/01/23 1529   09/01/23 0000  Discharge wound  care:       Comments: Reinforce dressing   09/01/23 1529            Follow-up Information     [facility]  Follow up.   Why: Someone will call you to scheudle resumption of care visit. Contact information: [address] [place] [address] [phone]         Rosamond Mon B, MD Follow up in 1 week(s).   Specialty: Internal Medicine Contact information: [address] [place] [address] [phone]         [facility] Follow up.   Why: Someone will call you to schedule first home visit. Contact information: [address] [place], [place] [address]               No Known Allergies  The results of significant diagnostics from this hospitalization (including imaging, microbiology, ancillary and laboratory) are listed below for reference.    Microbiology: Recent Results (from the past 240 hours)  Blood culture (routine x 2)     Status: None   Collection Time: 08/24/23  2:46 AM   Specimen: BLOOD LEFT WRIST  Result Value Ref Range Status   Specimen Description BLOOD LEFT WRIST  Final   Special Requests   Final    BOTTLES DRAWN AEROBIC AND ANAEROBIC Blood Culture adequate volume   Culture   Final    NO GROWTH 5 DAYS Performed at [facility], [address] [place], [place] [address]    Report Status 08/29/2023 FINAL  Final  Blood culture (routine x 2)     Status: None   Collection Time: 08/24/23  2:46 AM   Specimen: BLOOD RIGHT FOREARM  Result Value Ref Range Status   Specimen Description BLOOD RIGHT FOREARM  Final   Special Requests   Final    BOTTLES DRAWN AEROBIC AND ANAEROBIC Blood Culture  adequate volume   Culture   Final    NO GROWTH 5 DAYS Performed at [facility], [address], [place], [place] [address]    Report Status 08/29/2023 FINAL  Final  MRSA Next Gen by PCR, Nasal     Status: Abnormal   Collection Time: 08/24/23 11:45 AM   Specimen: Nasal Mucosa; Nasal Swab  Result  Value Ref Range Status   MRSA by PCR Next Gen DETECTED (A) NOT DETECTED Final    Comment: RESULT CALLED TO, READ BACK BY AND VERIFIED WITH: W EARLY,RN@1546  08/24/23 MK (NOTE) The GeneXpert MRSA Assay (FDA approved for NASAL specimens only), is one component of a comprehensive MRSA colonization surveillance program. It is not intended to diagnose MRSA infection nor to guide or monitor treatment for MRSA infections. Test performance is not FDA approved in patients less than 71 years old. Performed at [facility], [address], [place], [place] [address]   Urine Culture     Status: Abnormal   Collection Time: 08/24/23  4:26 PM   Specimen: Urine, Random  Result Value Ref Range Status   Specimen Description URINE, RANDOM  Final   Special Requests   Final    NONE Reflexed from [id number] Performed at [facility], [address], [place], [place] [address]    Culture MULTIPLE SPECIES PRESENT, SUGGEST RECOLLECTION (A)  Final   Report Status 08/25/2023 FINAL  Final  Aerobic/Anaerobic Culture w Gram Stain (surgical/deep wound)     Status: None   Collection Time: 08/25/23  8:04 AM   Specimen: PATH Soft tissue  Result Value Ref Range Status   Specimen Description TISSUE  Final   Special Requests ABSCESS RIGHT HIP  Final   Gram Stain NO WBC SEEN NO ORGANISMS SEEN   Final   Culture   Final    FEW STAPHYLOCOCCUS AUREUS SUSCEPTIBILITIES PERFORMED ON PREVIOUS CULTURE WITHIN THE LAST 5 DAYS. NO ANAEROBES ISOLATED Performed at [facility], [address], [place], [place] [address]    Report Status 08/30/2023 FINAL  Final  Aerobic/Anaerobic Culture w Gram Stain (surgical/deep wound)     Status: None (Preliminary result)   Collection Time: 08/25/23  8:10 AM   Specimen: PATH Soft tissue  Result Value Ref Range Status   Specimen Description TISSUE  Final   Special Requests ABSCESS RIGHT HIP  Final   Gram Stain NO WBC SEEN NO ORGANISMS SEEN   Final   Culture   Final    FEW  METHICILLIN RESISTANT STAPHYLOCOCCUS AUREUS NO ANAEROBES ISOLATED Sent to [facility] for further susceptibility testing. Performed at [facility], [address], [place], [place] [address]    Report Status PENDING  Incomplete   Organism ID, Bacteria METHICILLIN RESISTANT STAPHYLOCOCCUS AUREUS  Final      Susceptibility   Methicillin resistant staphylococcus aureus - MIC*    CIPROFLOXACIN  <=0.5 SENSITIVE Sensitive     ERYTHROMYCIN >=8 RESISTANT Resistant     GENTAMICIN <=0.5 SENSITIVE Sensitive     OXACILLIN >=4 RESISTANT Resistant     TETRACYCLINE <=1 SENSITIVE Sensitive     VANCOMYCIN  1 SENSITIVE Sensitive     TRIMETH /SULFA  <=10 SENSITIVE Sensitive     CLINDAMYCIN <=0.25 SENSITIVE Sensitive     RIFAMPIN <=0.5 SENSITIVE Sensitive     Inducible Clindamycin NEGATIVE Sensitive     LINEZOLID  2 SENSITIVE Sensitive     * FEW METHICILLIN RESISTANT STAPHYLOCOCCUS AUREUS  Culture, blood (Routine X 2) w Reflex to ID Panel  Status: None   Collection Time: 08/25/23  2:12 PM   Specimen: BLOOD RIGHT ARM  Result Value Ref Range Status   Specimen Description BLOOD RIGHT ARM  Final   Special Requests   Final    BOTTLES DRAWN AEROBIC ONLY Blood Culture adequate volume   Culture   Final    NO GROWTH 5 DAYS Performed at [facility], [address], [place], [place] [address]    Report Status 08/30/2023 FINAL  Final  Culture, blood (Routine X 2) w Reflex to ID Panel     Status: None   Collection Time: 08/25/23  2:16 PM   Specimen: BLOOD RIGHT HAND  Result Value Ref Range Status   Specimen Description BLOOD RIGHT HAND  Final   Special Requests   Final    BOTTLES DRAWN AEROBIC ONLY Blood Culture adequate volume   Culture   Final    NO GROWTH 5 DAYS Performed at [facility], [address] [place], [place] [address]    Report Status 08/30/2023 FINAL  Final  MIC (1 Drug)-Abscess right hip; 08/25/2023; Abscess; MRSA; Daptomycin      Status: Abnormal   Collection Time: 08/29/23   8:16 AM   Specimen: Abscess  Result Value Ref Range Status   Min Inhibitory Conc (1 Drug) Preliminary report (A)  Final    Comment: (NOTE) Performed At: [facility] [address] [place] [address] [place], [place] [id number] Jennette Shorter MD Ey:[id number]    Source daptomycin  rt hip mrsa  Final    Comment: Performed at [facility], [address], [place], [place] [address]  MIC Result     Status: Abnormal   Collection Time: 08/29/23  8:16 AM  Result Value Ref Range Status   Result 1 (MIC) Comment (A)  Final    Comment: (NOTE) Methicillin - resistant Staphylococcus aureus Identification performed by account, not confirmed by this laboratory. Performed At: [facility] [address] [place], [place] [id number] Jennette Shorter MD Ey:[id number]     Procedures/Studies: US  EKG SITE RITE Result Date: 08/30/2023 If Site Rite image not attached, placement could not be confirmed due to current cardiac rhythm.  DG HIP UNILAT WITH PELVIS 1V RIGHT Result Date: 08/25/2023 CLINICAL DATA:  Right hip debridement for septic joint EXAM: DG HIP (WITH OR WITHOUT PELVIS) 1V RIGHT COMPARISON:  Plain film from the previous day. FLUOROSCOPY TIME:  Radiation Exposure Index (as provided by the fluoroscopic device): 0.42 mGy If the device does not provide the exposure index: Fluoroscopy Time:  3 seconds Number of Acquired Images:  1 FINDINGS: Single spot film is obtained with a grasping device debriding the right acetabulum. IMPRESSION: Right hip joint debridement. Electronically Signed   By: Oneil Devonshire M.D.   On: 08/25/2023 10:23   DG C-Arm 1-60 Min-No Report Result Date: 08/25/2023 Fluoroscopy was utilized by the requesting physician.  No radiographic interpretation.   ECHOCARDIOGRAM LIMITED Result Date: 08/24/2023    ECHOCARDIOGRAM LIMITED REPORT   Patient Name:   SOLMON BOHR Date of Exam: 08/24/2023 Medical Rec #:  [id number]       Height:       70.0 in Accession #:    [id number]      Weight:        183.4 lb Date of Birth:  1955-11-14       BSA:          2.012 m Patient Age:    35 years  BP:           106/49 mmHg Patient Gender: M               HR:           97 bpm. Exam Location:  Inpatient Procedure: Limited Echo Indications:    Bacteremia R78.81  History:        Patient has prior history of Echocardiogram examinations, most                 recent 07/12/2023. Risk Factors:Diabetes.  Sonographer:    Jayson Gaskins Referring Phys: [id number] OLADAPO ADEFESO IMPRESSIONS  1. Left ventricular ejection fraction, by estimation, is 55 to 60%. The left ventricle has normal function. The left ventricle has no regional wall motion abnormalities. Left ventricular diastolic parameters are indeterminate.  2. Right ventricular systolic function is normal. The right ventricular size is normal. Tricuspid regurgitation signal is inadequate for assessing PA pressure.  3. The aortic valve is tricuspid. There is mild calcification of the aortic valve.  4. The inferior vena cava is normal in size with greater than 50% respiratory variability, suggesting right atrial pressure of 3 mmHg.  5. Limited echo with no color doppler. Poor images, no valvular vegetation visualized but images limited. FINDINGS  Left Ventricle: Left ventricular ejection fraction, by estimation, is 55 to 60%. The left ventricle has normal function. The left ventricle has no regional wall motion abnormalities. The left ventricular internal cavity size was normal in size. There is  no left ventricular hypertrophy. Left ventricular diastolic parameters are indeterminate. Right Ventricle: The right ventricular size is normal. No increase in right ventricular wall thickness. Right ventricular systolic function is normal. Tricuspid regurgitation signal is inadequate for assessing PA pressure. Left Atrium: Left atrial size was normal in size. Right Atrium: Right atrial size was normal in size. Mitral Valve: There is mild calcification of the mitral valve  leaflet(s). Tricuspid Valve: The tricuspid valve is normal in structure. Tricuspid valve regurgitation is not demonstrated. Aortic Valve: The aortic valve is tricuspid. There is mild calcification of the aortic valve. Venous: The inferior vena cava is normal in size with greater than 50% respiratory variability, suggesting right atrial pressure of 3 mmHg. IAS/Shunts: No atrial level shunt detected by color flow Doppler. LEFT VENTRICLE PLAX 2D LVIDd:         4.90 cm LVIDs:         3.10 cm LV PW:         0.90 cm LV IVS:        1.00 cm  Dalton McleanMD Electronically signed by Ezra Kanner Signature Date/Time: 08/24/2023/5:08:15 PM    Final    DG HIP UNILAT WITH PELVIS 2-3 VIEWS RIGHT Result Date: 08/24/2023 CLINICAL DATA:  Right hip swelling and pain. EXAM: DG HIP (WITH OR WITHOUT PELVIS) 2-3V RIGHT COMPARISON:  MR right hip 08/18/2023, CT abdomen pelvis 06/23/2023. FINDINGS: Complete resorption of the right femoral head and neck with superior dislocation of the right femoral shaft. Erosive changes in the right acetabulum. Findings are similar to 06/23/2023. Mild subchondral sclerosis and osteophytosis in the left hip. IMPRESSION: Complete resorption/destruction of the right femoral head and neck with involvement of the right acetabulum and superior displacement of the right femoral shaft. Findings are worrisome for osteomyelitis. Please correlate with recent MR right hip performed 08/18/2023. Electronically Signed   By: Newell Eke M.D.   On: 08/24/2023 11:37   MR HIP RIGHT W WO CONTRAST Result Date: 08/18/2023 EXAM DESCRIPTION:  MR HIP RIGHT W WO CONTRAST CLINICAL HISTORY: Pain. COMPARISON: None Available. TECHNIQUE: MRI of the hip is performed according to our usual protocol with multiplanar multi sequence imaging. FINDINGS: There is diffuse cortical destruction of the femoral head and proximal neck. Severe marrow edema. There is superior migration of the residual proximal femur. There is mild to moderate  marrow edema and remodeling to the inferior and medial acetabulum as well as the superior acetabulum. There is a moderate to large complex joint effusion. Septic arthritis/osteomyelitis should be excluded. Findings could also be seen with paralysis. The marrow signal is otherwise unremarkable. The sacroiliac joints are unremarkable. There are patchy areas of ill-defined curvilinear fluid posterior to the right hip as well as in the greater trochanteric region with the largest collection in the greater trochanteric region measuring up to 4 cm. Infectious bursitis should be excluded as well as phlegmon/small abscesses posterior to the residual femur. These findings are best seen on the postcontrast axial sequence. There is also a small collection within the right iliac as musculature measuring 2 cm suspicious for abscess. Moderate to severe myositis to the gluteal musculature on the right. Soft tissue structures are otherwise unremarkable. Suprapubic catheter in place. IMPRESSION: Severe destruction to the right proximal femur and areas of the right acetabulum. Septic arthritis/osteomyelitis should be excluded. Correlate with laboratory values. Destructive changes can also be seen with paralysis. Moderate to large joint effusion which is complex. There are also areas of complex ill-defined fluid posterior to the joint space suspicious for phlegmon/developing abscesses as well as greater trochanteric bursal fluid which could reflect infected bursitis. This could also be aspirated if clinically warranted. Moderate to severe myositis to the gluteus musculature on the right. Electronically signed by: Reyes Frees MD 08/18/2023 08:19 PM EDT RP Workstation: [id number]    Labs: BNP (last 3 results) Recent Labs    07/09/23 1314  BNP 40.0   Basic Metabolic Panel: Recent Labs  Lab 08/27/23 0311 08/27/23 0347 08/28/23 0354 08/31/23 0333 09/02/23 0339  NA 133*  --  138 136 138  K 4.3  --  4.3 3.9 4.0  CL  103  --  106 106 107  CO2 22  --  23 23 22   GLUCOSE 170*  --  165* 139* 163*  BUN 22  --  23 33* 28*  CREATININE 1.61*  --  1.55* 1.65* 1.57*  CALCIUM  9.0  --  9.0 8.2* 8.4*  MG  --  1.9  --   --   --   PHOS 3.5  --  3.7  --   --    Liver Function Tests: Recent Labs  Lab 08/27/23 0311 08/28/23 0354  ALBUMIN  1.7* 1.7*   No results for input(s): LIPASE, AMYLASE in the last 168 hours. No results for input(s): AMMONIA in the last 168 hours. CBC: Recent Labs  Lab 08/27/23 0347 08/28/23 0354  WBC 10.6* 9.3  NEUTROABS 6.4  --   HGB 8.0* 7.9*  HCT 25.4* 24.6*  MCV 84.1 85.4  PLT 438* 422*   CBG: Recent Labs  Lab 09/01/23 0704 09/01/23 1159 09/01/23 1708 09/01/23 1953 09/02/23 0905  GLUCAP 122* 132* 100* 128* 139*   Recent Labs  Lab 09/01/23 0408  CKTOTAL 26*  Urinalysis    Component Value Date/Time   COLORURINE YELLOW 08/28/2023 0045   APPEARANCEUR CLOUDY (A) 08/28/2023 0045   APPEARANCEUR Cloudy (A) 04/13/2021 1059   LABSPEC 1.012 08/28/2023 0045   PHURINE 5.0 08/28/2023 0045   GLUCOSEU NEGATIVE 08/28/2023  0045   HGBUR SMALL (A) 08/28/2023 0045   BILIRUBINUR NEGATIVE 08/28/2023 0045   BILIRUBINUR Negative 04/13/2021 1059   KETONESUR NEGATIVE 08/28/2023 0045   PROTEINUR 30 (A) 08/28/2023 0045   UROBILINOGEN 1.0 09/21/2012 0530   NITRITE NEGATIVE 08/28/2023 0045   LEUKOCYTESUR LARGE (A) 08/28/2023 0045   Sepsis Labs Recent Labs  Lab 08/27/23 0347 08/28/23 0354  WBC 10.6* 9.3   Microbiology Recent Results (from the past 240 hours)  Blood culture (routine x 2)     Status: None   Collection Time: 08/24/23  2:46 AM   Specimen: BLOOD LEFT WRIST  Result Value Ref Range Status   Specimen Description BLOOD LEFT WRIST  Final   Special Requests   Final    BOTTLES DRAWN AEROBIC AND ANAEROBIC Blood Culture adequate volume   Culture   Final    NO GROWTH 5 DAYS Performed at [facility], [address] [place], [place] [address]    Report Status  08/29/2023 FINAL  Final  Blood culture (routine x 2)     Status: None   Collection Time: 08/24/23  2:46 AM   Specimen: BLOOD RIGHT FOREARM  Result Value Ref Range Status   Specimen Description BLOOD RIGHT FOREARM  Final   Special Requests   Final    BOTTLES DRAWN AEROBIC AND ANAEROBIC Blood Culture adequate volume   Culture   Final    NO GROWTH 5 DAYS Performed at [facility], [address], [place], [place] [address]    Report Status 08/29/2023 FINAL  Final  MRSA Next Gen by PCR, Nasal     Status: Abnormal   Collection Time: 08/24/23 11:45 AM   Specimen: Nasal Mucosa; Nasal Swab  Result Value Ref Range Status   MRSA by PCR Next Gen DETECTED (A) NOT DETECTED Final    Comment: RESULT CALLED TO, READ BACK BY AND VERIFIED WITH: W EARLY,RN@1546  08/24/23 MK (NOTE) The GeneXpert MRSA Assay (FDA approved for NASAL specimens only), is one component of a comprehensive MRSA colonization surveillance program. It is not intended to diagnose MRSA infection nor to guide or monitor treatment for MRSA infections. Test performance is not FDA approved in patients less than 25 years old. Performed at [facility], [address], [place], [place] [address]   Urine Culture     Status: Abnormal   Collection Time: 08/24/23  4:26 PM   Specimen: Urine, Random  Result Value Ref Range Status   Specimen Description URINE, RANDOM  Final   Special Requests   Final    NONE Reflexed from [id number] Performed at [facility], [address], [place], [place] [address]    Culture MULTIPLE SPECIES PRESENT, SUGGEST RECOLLECTION (A)  Final   Report Status 08/25/2023 FINAL  Final  Aerobic/Anaerobic Culture w Gram Stain (surgical/deep wound)     Status: None   Collection Time: 08/25/23  8:04 AM   Specimen: PATH Soft tissue  Result Value Ref Range Status   Specimen Description TISSUE  Final   Special Requests ABSCESS RIGHT HIP  Final   Gram Stain NO WBC SEEN NO ORGANISMS SEEN   Final   Culture   Final     FEW STAPHYLOCOCCUS AUREUS SUSCEPTIBILITIES PERFORMED ON PREVIOUS CULTURE WITHIN THE LAST 5 DAYS. NO ANAEROBES ISOLATED Performed at [facility], [address] [place], [place] [address]    Report Status 08/30/2023 FINAL  Final  Aerobic/Anaerobic Culture w Gram Stain (surgical/deep wound)     Status: None (Preliminary result)  Collection Time: 08/25/23  8:10 AM   Specimen: PATH Soft tissue  Result Value Ref Range Status   Specimen Description TISSUE  Final   Special Requests ABSCESS RIGHT HIP  Final   Gram Stain NO WBC SEEN NO ORGANISMS SEEN   Final   Culture   Final    FEW METHICILLIN RESISTANT STAPHYLOCOCCUS AUREUS NO ANAEROBES ISOLATED Sent to [facility] for further susceptibility testing. Performed at [facility], [address] [place], [place] [address]    Report Status PENDING  Incomplete   Organism ID, Bacteria METHICILLIN RESISTANT STAPHYLOCOCCUS AUREUS  Final      Susceptibility   Methicillin resistant staphylococcus aureus - MIC*    CIPROFLOXACIN  <=0.5 SENSITIVE Sensitive     ERYTHROMYCIN >=8 RESISTANT Resistant     GENTAMICIN <=0.5 SENSITIVE Sensitive     OXACILLIN >=4 RESISTANT Resistant     TETRACYCLINE <=1 SENSITIVE Sensitive     VANCOMYCIN  1 SENSITIVE Sensitive     TRIMETH /SULFA  <=10 SENSITIVE Sensitive     CLINDAMYCIN <=0.25 SENSITIVE Sensitive     RIFAMPIN <=0.5 SENSITIVE Sensitive     Inducible Clindamycin NEGATIVE Sensitive     LINEZOLID  2 SENSITIVE Sensitive     * FEW METHICILLIN RESISTANT STAPHYLOCOCCUS AUREUS  Culture, blood (Routine X 2) w Reflex to ID Panel     Status: None   Collection Time: 08/25/23  2:12 PM   Specimen: BLOOD RIGHT ARM  Result Value Ref Range Status   Specimen Description BLOOD RIGHT ARM  Final   Special Requests   Final    BOTTLES DRAWN AEROBIC ONLY Blood Culture adequate volume   Culture   Final    NO GROWTH 5 DAYS Performed at [facility], [address] [place], [place] [address]    Report Status  08/30/2023 FINAL  Final  Culture, blood (Routine X 2) w Reflex to ID Panel     Status: None   Collection Time: 08/25/23  2:16 PM   Specimen: BLOOD RIGHT HAND  Result Value Ref Range Status   Specimen Description BLOOD RIGHT HAND  Final   Special Requests   Final    BOTTLES DRAWN AEROBIC ONLY Blood Culture adequate volume   Culture   Final    NO GROWTH 5 DAYS Performed at [facility], [address], [place], [place] [address]    Report Status 08/30/2023 FINAL  Final  MIC (1 Drug)-Abscess right hip; 08/25/2023; Abscess; MRSA; Daptomycin      Status: Abnormal   Collection Time: 08/29/23  8:16 AM   Specimen: Abscess  Result Value Ref Range Status   Min Inhibitory Conc (1 Drug) Preliminary report (A)  Final    Comment: (NOTE) Performed At: [facility] [address] [place], [place] [id number] Jennette Shorter MD Ey:[id number]    Source daptomycin  rt hip mrsa  Final    Comment: Performed at [facility], [address], [place], [place] [address]  MIC Result     Status: Abnormal   Collection Time: 08/29/23  8:16 AM  Result Value Ref Range Status   Result 1 (MIC) Comment (A)  Final    Comment: (NOTE) Methicillin - resistant Staphylococcus aureus Identification performed by account, not confirmed by this laboratory. Performed At: [facility] [address] [place], [place] [id number] Jennette Shorter MD Ey:[id number]      Time coordinating discharge: 35 minutes  SIGNED: Mennie LAMY, MD  [facility] 09/02/2023, 9:20 AM  If 7PM-7AM, please contact night-coverage [email or web address]

## 2023-09-02 NOTE — Plan of Care (Signed)

## 2023-09-02 NOTE — TOC Transition Note (Signed)
 Transition of Care [facility]) - Discharge Note   Patient Details  Name: [name] MRN: [id number] Date of Birth: 10-Mar-1955  Transition of [facility]) CM/SW Contact:  Tom-Johnson, Harvest Muskrat, RN Phone Number: 09/02/2023, 10:04 AM   Clinical Narrative:     Patient is scheduled for discharge today.  Readmission Risk Assessment done. Home health info, hospital f/u and discharge instructions on AVS. Pam with [facility] has done education with patient's nephew's wife, [name] and understanding verbalized.  Family to transport at discharge.  No further TOC needs noted.          Final next level of care: Home w Home Health Services Barriers to Discharge: Barriers Resolved   Patient Goals and CMS Choice Patient states their goals for this hospitalization and ongoing recovery are:: To return home CMS Medicare.gov Compare Post Acute Care list provided to:: Patient Choice offered to / list presented to : Patient, Sibling (Sister [name])      Discharge Placement                Patient to be transferred to facility by: Family      Discharge Plan and Services Additional resources added to the After Visit Summary for                  DME Arranged: N/A DME Agency: NA       HH Arranged: PT, RN, OT HH Agency: [facility] Date HH Agency Contacted: 08/30/23 Time HH Agency Contacted: 1415    Social Drivers of Health (SDOH) Interventions SDOH Screenings   Food Insecurity: No Food Insecurity (08/24/2023)  Housing: Low Risk  (08/24/2023)  Transportation Needs: No Transportation Needs (08/24/2023)  Utilities: Not At Risk (08/24/2023)  Financial Resource Strain: Medium Risk (05/19/2022)   Received from [facility]  Social Connections: Unknown (08/24/2023)  Tobacco Use: Medium Risk (08/24/2023)  Health Literacy: High Risk (02/24/2021)   Received from [facility]     Readmission Risk Interventions    09/02/2023   10:03 AM 07/14/2023   12:12 PM  07/13/2023    1:18 PM  Readmission Risk Prevention Plan  Transportation Screening Complete Complete Complete  PCP or Specialist Appt within 3-5 Days Complete    HRI or Home Care Consult Complete Complete Complete  Social Work Consult for Recovery Care Planning/Counseling Complete Complete Complete  Palliative Care Screening Not Applicable Not Applicable Not Applicable  Medication Review Oceanographer) Referral to Pharmacy Complete Complete

## 2023-09-02 NOTE — Progress Notes (Signed)
 DISCHARGE NOTE HOME [name] to be discharged Home per MD order. Discussed prescriptions and follow up appointments with the patient. Prescriptions given to patient; medication list explained in detail. Patient verbalized understanding.  Skin clean, dry and intact without evidence of skin break down, no evidence of skin tears noted.PICC to remain in place and home health set up. Site without signs and symptoms of complications. Dressing and pressure applied. Pt denies pain at the site currently. No complaints noted.  An After Visit Summary (AVS) was printed and given to the patient. Has written script for daptomycin  and vicodin.  Patient escorted via wheelchair, and discharged home via private auto.

## 2023-09-03 DIAGNOSIS — E1165 Type 2 diabetes mellitus with hyperglycemia: Secondary | ICD-10-CM | POA: Diagnosis not present

## 2023-09-03 DIAGNOSIS — R7881 Bacteremia: Secondary | ICD-10-CM | POA: Diagnosis not present

## 2023-09-03 LAB — MIC RESULT

## 2023-09-03 LAB — MINIMUM INHIBITORY CONC. (1 DRUG)

## 2023-09-04 LAB — AEROBIC/ANAEROBIC CULTURE W GRAM STAIN (SURGICAL/DEEP WOUND): Gram Stain: NONE SEEN

## 2023-09-05 ENCOUNTER — Telehealth: Payer: Self-pay

## 2023-09-05 ENCOUNTER — Telehealth: Payer: Self-pay | Admitting: Urology

## 2023-09-05 DIAGNOSIS — I10 Essential (primary) hypertension: Secondary | ICD-10-CM | POA: Diagnosis not present

## 2023-09-05 DIAGNOSIS — N390 Urinary tract infection, site not specified: Secondary | ICD-10-CM | POA: Diagnosis not present

## 2023-09-05 DIAGNOSIS — Z791 Long term (current) use of non-steroidal anti-inflammatories (NSAID): Secondary | ICD-10-CM | POA: Diagnosis not present

## 2023-09-05 DIAGNOSIS — E119 Type 2 diabetes mellitus without complications: Secondary | ICD-10-CM | POA: Diagnosis not present

## 2023-09-05 DIAGNOSIS — Z7984 Long term (current) use of oral hypoglycemic drugs: Secondary | ICD-10-CM | POA: Diagnosis not present

## 2023-09-05 DIAGNOSIS — E039 Hypothyroidism, unspecified: Secondary | ICD-10-CM | POA: Diagnosis not present

## 2023-09-05 DIAGNOSIS — M71051 Abscess of bursa, right hip: Secondary | ICD-10-CM | POA: Diagnosis not present

## 2023-09-05 DIAGNOSIS — M86051 Acute hematogenous osteomyelitis, right femur: Secondary | ICD-10-CM | POA: Diagnosis not present

## 2023-09-05 DIAGNOSIS — K219 Gastro-esophageal reflux disease without esophagitis: Secondary | ICD-10-CM | POA: Diagnosis not present

## 2023-09-05 DIAGNOSIS — Z87891 Personal history of nicotine dependence: Secondary | ICD-10-CM | POA: Diagnosis not present

## 2023-09-05 DIAGNOSIS — E1165 Type 2 diabetes mellitus with hyperglycemia: Secondary | ICD-10-CM | POA: Diagnosis not present

## 2023-09-05 DIAGNOSIS — R7881 Bacteremia: Secondary | ICD-10-CM | POA: Diagnosis not present

## 2023-09-05 DIAGNOSIS — N179 Acute kidney failure, unspecified: Secondary | ICD-10-CM | POA: Diagnosis not present

## 2023-09-05 DIAGNOSIS — E782 Mixed hyperlipidemia: Secondary | ICD-10-CM | POA: Diagnosis not present

## 2023-09-05 DIAGNOSIS — L89610 Pressure ulcer of right heel, unstageable: Secondary | ICD-10-CM | POA: Diagnosis not present

## 2023-09-05 DIAGNOSIS — E1169 Type 2 diabetes mellitus with other specified complication: Secondary | ICD-10-CM | POA: Diagnosis not present

## 2023-09-05 DIAGNOSIS — Z7982 Long term (current) use of aspirin: Secondary | ICD-10-CM | POA: Diagnosis not present

## 2023-09-05 DIAGNOSIS — Z556 Problems related to health literacy: Secondary | ICD-10-CM | POA: Diagnosis not present

## 2023-09-05 DIAGNOSIS — M609 Myositis, unspecified: Secondary | ICD-10-CM | POA: Diagnosis not present

## 2023-09-05 DIAGNOSIS — M009 Pyogenic arthritis, unspecified: Secondary | ICD-10-CM | POA: Diagnosis not present

## 2023-09-05 NOTE — Telephone Encounter (Signed)
 Patient was in hospital and missed appointment. He needs cath change is possible this Wednesday

## 2023-09-05 NOTE — Telephone Encounter (Signed)
 Patient missed his July 10th cath change due to being hospitalized.  Requesting a soon as possible appointment for cath change.  Please advise.  Call:  [phone]

## 2023-09-06 NOTE — Telephone Encounter (Signed)
 Cath change schedule spoke w/ pt nephew see previous encounter

## 2023-09-06 NOTE — Telephone Encounter (Signed)
 Duplicate message

## 2023-09-08 ENCOUNTER — Ambulatory Visit: Admitting: Infectious Diseases

## 2023-09-08 DIAGNOSIS — E1165 Type 2 diabetes mellitus with hyperglycemia: Secondary | ICD-10-CM | POA: Diagnosis not present

## 2023-09-08 DIAGNOSIS — Z7982 Long term (current) use of aspirin: Secondary | ICD-10-CM | POA: Diagnosis not present

## 2023-09-08 DIAGNOSIS — K219 Gastro-esophageal reflux disease without esophagitis: Secondary | ICD-10-CM | POA: Diagnosis not present

## 2023-09-08 DIAGNOSIS — E1169 Type 2 diabetes mellitus with other specified complication: Secondary | ICD-10-CM | POA: Diagnosis not present

## 2023-09-08 DIAGNOSIS — Z556 Problems related to health literacy: Secondary | ICD-10-CM | POA: Diagnosis not present

## 2023-09-08 DIAGNOSIS — R7881 Bacteremia: Secondary | ICD-10-CM | POA: Diagnosis not present

## 2023-09-08 DIAGNOSIS — M71051 Abscess of bursa, right hip: Secondary | ICD-10-CM | POA: Diagnosis not present

## 2023-09-08 DIAGNOSIS — E782 Mixed hyperlipidemia: Secondary | ICD-10-CM | POA: Diagnosis not present

## 2023-09-08 DIAGNOSIS — Z87891 Personal history of nicotine dependence: Secondary | ICD-10-CM | POA: Diagnosis not present

## 2023-09-08 DIAGNOSIS — M009 Pyogenic arthritis, unspecified: Secondary | ICD-10-CM | POA: Diagnosis not present

## 2023-09-08 DIAGNOSIS — M86051 Acute hematogenous osteomyelitis, right femur: Secondary | ICD-10-CM | POA: Diagnosis not present

## 2023-09-08 DIAGNOSIS — I10 Essential (primary) hypertension: Secondary | ICD-10-CM | POA: Diagnosis not present

## 2023-09-08 DIAGNOSIS — E039 Hypothyroidism, unspecified: Secondary | ICD-10-CM | POA: Diagnosis not present

## 2023-09-08 DIAGNOSIS — Z7984 Long term (current) use of oral hypoglycemic drugs: Secondary | ICD-10-CM | POA: Diagnosis not present

## 2023-09-08 DIAGNOSIS — N179 Acute kidney failure, unspecified: Secondary | ICD-10-CM | POA: Diagnosis not present

## 2023-09-08 DIAGNOSIS — L89610 Pressure ulcer of right heel, unstageable: Secondary | ICD-10-CM | POA: Diagnosis not present

## 2023-09-08 DIAGNOSIS — M609 Myositis, unspecified: Secondary | ICD-10-CM | POA: Diagnosis not present

## 2023-09-08 DIAGNOSIS — Z791 Long term (current) use of non-steroidal anti-inflammatories (NSAID): Secondary | ICD-10-CM | POA: Diagnosis not present

## 2023-09-09 DIAGNOSIS — E039 Hypothyroidism, unspecified: Secondary | ICD-10-CM | POA: Diagnosis not present

## 2023-09-09 DIAGNOSIS — Z791 Long term (current) use of non-steroidal anti-inflammatories (NSAID): Secondary | ICD-10-CM | POA: Diagnosis not present

## 2023-09-09 DIAGNOSIS — Z556 Problems related to health literacy: Secondary | ICD-10-CM | POA: Diagnosis not present

## 2023-09-09 DIAGNOSIS — M86051 Acute hematogenous osteomyelitis, right femur: Secondary | ICD-10-CM | POA: Diagnosis not present

## 2023-09-09 DIAGNOSIS — M609 Myositis, unspecified: Secondary | ICD-10-CM | POA: Diagnosis not present

## 2023-09-09 DIAGNOSIS — I10 Essential (primary) hypertension: Secondary | ICD-10-CM | POA: Diagnosis not present

## 2023-09-09 DIAGNOSIS — K219 Gastro-esophageal reflux disease without esophagitis: Secondary | ICD-10-CM | POA: Diagnosis not present

## 2023-09-09 DIAGNOSIS — R7881 Bacteremia: Secondary | ICD-10-CM | POA: Diagnosis not present

## 2023-09-09 DIAGNOSIS — Z7982 Long term (current) use of aspirin: Secondary | ICD-10-CM | POA: Diagnosis not present

## 2023-09-09 DIAGNOSIS — N179 Acute kidney failure, unspecified: Secondary | ICD-10-CM | POA: Diagnosis not present

## 2023-09-09 DIAGNOSIS — M009 Pyogenic arthritis, unspecified: Secondary | ICD-10-CM | POA: Diagnosis not present

## 2023-09-09 DIAGNOSIS — E1169 Type 2 diabetes mellitus with other specified complication: Secondary | ICD-10-CM | POA: Diagnosis not present

## 2023-09-09 DIAGNOSIS — E782 Mixed hyperlipidemia: Secondary | ICD-10-CM | POA: Diagnosis not present

## 2023-09-09 DIAGNOSIS — Z87891 Personal history of nicotine dependence: Secondary | ICD-10-CM | POA: Diagnosis not present

## 2023-09-09 DIAGNOSIS — Z7984 Long term (current) use of oral hypoglycemic drugs: Secondary | ICD-10-CM | POA: Diagnosis not present

## 2023-09-09 DIAGNOSIS — L89610 Pressure ulcer of right heel, unstageable: Secondary | ICD-10-CM | POA: Diagnosis not present

## 2023-09-09 DIAGNOSIS — M71051 Abscess of bursa, right hip: Secondary | ICD-10-CM | POA: Diagnosis not present

## 2023-09-09 DIAGNOSIS — E1165 Type 2 diabetes mellitus with hyperglycemia: Secondary | ICD-10-CM | POA: Diagnosis not present

## 2023-09-12 DIAGNOSIS — Z7984 Long term (current) use of oral hypoglycemic drugs: Secondary | ICD-10-CM | POA: Diagnosis not present

## 2023-09-12 DIAGNOSIS — M86051 Acute hematogenous osteomyelitis, right femur: Secondary | ICD-10-CM | POA: Diagnosis not present

## 2023-09-12 DIAGNOSIS — Z7982 Long term (current) use of aspirin: Secondary | ICD-10-CM | POA: Diagnosis not present

## 2023-09-12 DIAGNOSIS — N179 Acute kidney failure, unspecified: Secondary | ICD-10-CM | POA: Diagnosis not present

## 2023-09-12 DIAGNOSIS — I10 Essential (primary) hypertension: Secondary | ICD-10-CM | POA: Diagnosis not present

## 2023-09-12 DIAGNOSIS — E1165 Type 2 diabetes mellitus with hyperglycemia: Secondary | ICD-10-CM | POA: Diagnosis not present

## 2023-09-12 DIAGNOSIS — E782 Mixed hyperlipidemia: Secondary | ICD-10-CM | POA: Diagnosis not present

## 2023-09-12 DIAGNOSIS — Z556 Problems related to health literacy: Secondary | ICD-10-CM | POA: Diagnosis not present

## 2023-09-12 DIAGNOSIS — Z791 Long term (current) use of non-steroidal anti-inflammatories (NSAID): Secondary | ICD-10-CM | POA: Diagnosis not present

## 2023-09-12 DIAGNOSIS — M71051 Abscess of bursa, right hip: Secondary | ICD-10-CM | POA: Diagnosis not present

## 2023-09-12 DIAGNOSIS — R7881 Bacteremia: Secondary | ICD-10-CM | POA: Diagnosis not present

## 2023-09-12 DIAGNOSIS — E1169 Type 2 diabetes mellitus with other specified complication: Secondary | ICD-10-CM | POA: Diagnosis not present

## 2023-09-12 DIAGNOSIS — E039 Hypothyroidism, unspecified: Secondary | ICD-10-CM | POA: Diagnosis not present

## 2023-09-12 DIAGNOSIS — Z87891 Personal history of nicotine dependence: Secondary | ICD-10-CM | POA: Diagnosis not present

## 2023-09-12 DIAGNOSIS — M009 Pyogenic arthritis, unspecified: Secondary | ICD-10-CM | POA: Diagnosis not present

## 2023-09-12 DIAGNOSIS — K219 Gastro-esophageal reflux disease without esophagitis: Secondary | ICD-10-CM | POA: Diagnosis not present

## 2023-09-12 DIAGNOSIS — L89610 Pressure ulcer of right heel, unstageable: Secondary | ICD-10-CM | POA: Diagnosis not present

## 2023-09-12 DIAGNOSIS — M609 Myositis, unspecified: Secondary | ICD-10-CM | POA: Diagnosis not present

## 2023-09-13 DIAGNOSIS — Z299 Encounter for prophylactic measures, unspecified: Secondary | ICD-10-CM | POA: Diagnosis not present

## 2023-09-13 DIAGNOSIS — M009 Pyogenic arthritis, unspecified: Secondary | ICD-10-CM | POA: Diagnosis not present

## 2023-09-13 DIAGNOSIS — N3281 Overactive bladder: Secondary | ICD-10-CM | POA: Diagnosis not present

## 2023-09-15 ENCOUNTER — Ambulatory Visit

## 2023-09-15 ENCOUNTER — Telehealth: Payer: Self-pay

## 2023-09-15 DIAGNOSIS — E1165 Type 2 diabetes mellitus with hyperglycemia: Secondary | ICD-10-CM | POA: Diagnosis not present

## 2023-09-15 DIAGNOSIS — Z7984 Long term (current) use of oral hypoglycemic drugs: Secondary | ICD-10-CM | POA: Diagnosis not present

## 2023-09-15 DIAGNOSIS — Z87891 Personal history of nicotine dependence: Secondary | ICD-10-CM | POA: Diagnosis not present

## 2023-09-15 DIAGNOSIS — N179 Acute kidney failure, unspecified: Secondary | ICD-10-CM | POA: Diagnosis not present

## 2023-09-15 DIAGNOSIS — E1169 Type 2 diabetes mellitus with other specified complication: Secondary | ICD-10-CM | POA: Diagnosis not present

## 2023-09-15 DIAGNOSIS — R339 Retention of urine, unspecified: Secondary | ICD-10-CM | POA: Diagnosis not present

## 2023-09-15 DIAGNOSIS — E782 Mixed hyperlipidemia: Secondary | ICD-10-CM | POA: Diagnosis not present

## 2023-09-15 DIAGNOSIS — Z556 Problems related to health literacy: Secondary | ICD-10-CM | POA: Diagnosis not present

## 2023-09-15 DIAGNOSIS — M009 Pyogenic arthritis, unspecified: Secondary | ICD-10-CM | POA: Diagnosis not present

## 2023-09-15 DIAGNOSIS — Z7982 Long term (current) use of aspirin: Secondary | ICD-10-CM | POA: Diagnosis not present

## 2023-09-15 DIAGNOSIS — Z791 Long term (current) use of non-steroidal anti-inflammatories (NSAID): Secondary | ICD-10-CM | POA: Diagnosis not present

## 2023-09-15 DIAGNOSIS — M609 Myositis, unspecified: Secondary | ICD-10-CM | POA: Diagnosis not present

## 2023-09-15 DIAGNOSIS — L89610 Pressure ulcer of right heel, unstageable: Secondary | ICD-10-CM | POA: Diagnosis not present

## 2023-09-15 DIAGNOSIS — I10 Essential (primary) hypertension: Secondary | ICD-10-CM | POA: Diagnosis not present

## 2023-09-15 DIAGNOSIS — K219 Gastro-esophageal reflux disease without esophagitis: Secondary | ICD-10-CM | POA: Diagnosis not present

## 2023-09-15 DIAGNOSIS — M86051 Acute hematogenous osteomyelitis, right femur: Secondary | ICD-10-CM | POA: Diagnosis not present

## 2023-09-15 DIAGNOSIS — E039 Hypothyroidism, unspecified: Secondary | ICD-10-CM | POA: Diagnosis not present

## 2023-09-15 DIAGNOSIS — R7881 Bacteremia: Secondary | ICD-10-CM | POA: Diagnosis not present

## 2023-09-15 DIAGNOSIS — M71051 Abscess of bursa, right hip: Secondary | ICD-10-CM | POA: Diagnosis not present

## 2023-09-15 MED ORDER — CIPROFLOXACIN HCL 500 MG PO TABS: 500.0000 mg | ORAL_TABLET | Freq: Once | ORAL | Status: DC

## 2023-09-15 NOTE — Telephone Encounter (Signed)
 Patient in office today for sp catheter change accompanied by his nephew.  Nephew states patient was recently in [facility] were he was prescribed  Oxybutynin  5 mg. Nephew states he does not know why patient was placed on the medication but he wanted to know if he should continue and if so of we could give write a new prescription.

## 2023-09-15 NOTE — Progress Notes (Signed)
 Suprapubic Cath Change  Patient is present today for a suprapubic catheter change due to urinary retention.  10ml of water was drained from the balloon, a 16FR foley cath was removed from the tract with out difficulty.  Suprapubic catheter site was cleaned and prepped in a sterile fashion with Betadinex3  A 16FR foley cath was replaced into the tract no complications were noted. Urine return was noted, urine Clear yellow in color . 10 ml of sterile water was inflated into the balloon and a bedside bag was attached for drainage.  Patient tolerated well.   Performed by: Guilherme Schwenke LPN  Follow up: 4 weeks

## 2023-09-15 NOTE — Addendum Note (Signed)
 Addended byBETHA MALACHY SLICE on: 09/15/2023 03:13 PM   Modules accepted: Orders

## 2023-09-15 NOTE — Patient Instructions (Signed)

## 2023-09-16 NOTE — Telephone Encounter (Signed)
 Nephew called made aware and voiced understanding.

## 2023-09-18 ENCOUNTER — Other Ambulatory Visit: Payer: Self-pay

## 2023-09-18 ENCOUNTER — Encounter (HOSPITAL_COMMUNITY): Payer: Self-pay

## 2023-09-18 ENCOUNTER — Emergency Department (HOSPITAL_COMMUNITY)
Admission: EM | Admit: 2023-09-18 | Discharge: 2023-09-18 | Disposition: A | Discharge status: Home / Self Care | Attending: Emergency Medicine | Admitting: Emergency Medicine

## 2023-09-18 DIAGNOSIS — L97519 Non-pressure chronic ulcer of other part of right foot with unspecified severity: Secondary | ICD-10-CM | POA: Insufficient documentation

## 2023-09-18 DIAGNOSIS — F0390 Unspecified dementia without behavioral disturbance: Secondary | ICD-10-CM | POA: Diagnosis not present

## 2023-09-18 DIAGNOSIS — Z4800 Encounter for change or removal of nonsurgical wound dressing: Secondary | ICD-10-CM | POA: Diagnosis not present

## 2023-09-18 DIAGNOSIS — E039 Hypothyroidism, unspecified: Secondary | ICD-10-CM | POA: Diagnosis not present

## 2023-09-18 DIAGNOSIS — L97529 Non-pressure chronic ulcer of other part of left foot with unspecified severity: Secondary | ICD-10-CM | POA: Diagnosis not present

## 2023-09-18 DIAGNOSIS — Z4801 Encounter for change or removal of surgical wound dressing: Secondary | ICD-10-CM | POA: Insufficient documentation

## 2023-09-18 DIAGNOSIS — S79919A Unspecified injury of unspecified hip, initial encounter: Secondary | ICD-10-CM | POA: Diagnosis not present

## 2023-09-18 DIAGNOSIS — Z5189 Encounter for other specified aftercare: Secondary | ICD-10-CM

## 2023-09-18 DIAGNOSIS — Z7982 Long term (current) use of aspirin: Secondary | ICD-10-CM | POA: Insufficient documentation

## 2023-09-18 DIAGNOSIS — Z79890 Hormone replacement therapy (postmenopausal): Secondary | ICD-10-CM | POA: Diagnosis not present

## 2023-09-18 DIAGNOSIS — T8130XA Disruption of wound, unspecified, initial encounter: Secondary | ICD-10-CM | POA: Diagnosis not present

## 2023-09-18 DIAGNOSIS — Y999 Unspecified external cause status: Secondary | ICD-10-CM | POA: Diagnosis not present

## 2023-09-18 LAB — COMPREHENSIVE METABOLIC PANEL WITH GFR
ALT: 12 U/L (ref 0–44)
AST: 15 U/L (ref 15–41)
Albumin: 2.4 g/dL — ABNORMAL LOW (ref 3.5–5.0)
Alkaline Phosphatase: 58 U/L (ref 38–126)
Anion gap: 7 (ref 5–15)
BUN: 19 mg/dL (ref 8–23)
CO2: 23 mmol/L (ref 22–32)
Calcium: 9 mg/dL (ref 8.9–10.3)
Chloride: 104 mmol/L (ref 98–111)
Creatinine, Ser: 1.54 mg/dL — ABNORMAL HIGH (ref 0.61–1.24)
GFR, Estimated: 49 mL/min — ABNORMAL LOW (ref >60)
Glucose, Bld: 141 mg/dL — ABNORMAL HIGH (ref 70–99)
Potassium: 4.8 mmol/L (ref 3.5–5.1)
Sodium: 134 mmol/L — ABNORMAL LOW (ref 135–145)
Total Bilirubin: 0.4 mg/dL (ref 0.0–1.2)
Total Protein: 7.7 g/dL (ref 6.5–8.1)

## 2023-09-18 LAB — CBC WITH DIFFERENTIAL/PLATELET
Abs Immature Granulocytes: 0.03 K/uL (ref 0.00–0.07)
Basophils Absolute: 0.1 K/uL (ref 0.0–0.1)
Basophils Relative: 1 %
Eosinophils Absolute: 0.2 K/uL (ref 0.0–0.5)
Eosinophils Relative: 2 %
HCT: 24.5 % — ABNORMAL LOW (ref 39.0–52.0)
Hemoglobin: 7.3 g/dL — ABNORMAL LOW (ref 13.0–17.0)
Immature Granulocytes: 0 %
Lymphocytes Relative: 33 %
Lymphs Abs: 2.5 K/uL (ref 0.7–4.0)
MCH: 26.9 pg (ref 26.0–34.0)
MCHC: 29.8 g/dL — ABNORMAL LOW (ref 30.0–36.0)
MCV: 90.4 fL (ref 80.0–100.0)
Monocytes Absolute: 0.3 K/uL (ref 0.1–1.0)
Monocytes Relative: 4 %
Neutro Abs: 4.3 K/uL (ref 1.7–7.7)
Neutrophils Relative %: 60 %
Platelets: 192 K/uL (ref 150–400)
RBC: 2.71 MIL/uL — ABNORMAL LOW (ref 4.22–5.81)
RDW: 18.6 % — ABNORMAL HIGH (ref 11.5–15.5)
WBC: 7.4 K/uL (ref 4.0–10.5)
nRBC: 0 % (ref 0.0–0.2)

## 2023-09-18 LAB — SEDIMENTATION RATE: Sed Rate: 135 mm/h — ABNORMAL HIGH (ref 0–16)

## 2023-09-18 LAB — C-REACTIVE PROTEIN: CRP: 1.6 mg/dL — ABNORMAL HIGH (ref <1.0)

## 2023-09-18 NOTE — ED Notes (Signed)
 Surgical site cleaned and covered with ABD pad and secured with 2 inch paper tape along the borders.

## 2023-09-18 NOTE — Discharge Instructions (Signed)
 Keep wound clean, dry and covered. Continue with current treatment plan. Follow up with Dr Kendal and return to ER with new or worsening symptoms.

## 2023-09-18 NOTE — ED Provider Notes (Signed)
 [place] EMERGENCY DEPARTMENT AT [facility] Provider Note   CSN: [id number] Arrival date & time: 09/18/23  1248     Patient presents with: Wound Check   [name] is a 68 y.o. male schizophrenia, dementia, hypothyroidism, hyperlipidemia presents to emergency room for wound check.  Patient is recently status post incision and washout with Dr. Kendal for right hip abscess/osteomyelitis.  Things have been going well at home however noted slight oozing from incision. Small and red tinged. No purulence. Spoke to patient's nephew who reports this started last night after patient sat down to use the toilet.  Noticed very small amount of blood draining from incision site and has been constant throughout today.  Otherwise did not notice any increasing erythema or swelling of the area.  Patient has not complained of pain or fever and otherwise is acting at baseline.  Have been on daptomycin  IV at home daily and have not missed any doses.  Follow-up with orthopedics on 8 August.  No injury trauma or fall prior to this starting.    Wound Check       Prior to Admission medications   Medication Sig Start Date End Date Taking? Authorizing Provider  aspirin  EC 81 MG tablet Take 1 tablet (81 mg total) by mouth 2 (two) times daily for 28 days. Swallow whole. 08/27/23 09/24/23  Renae Bernarda HERO, PA-C  benztropine  (COGENTIN ) 2 MG tablet Take 1 tablet (2 mg total) by mouth at bedtime. 09/28/12   [name] LABOR, NP  chlorhexidine  (HIBICLENS ) 4 % external liquid Apply 15 mLs (1 Application total) topically as directed for 30 doses. Use as directed daily for 5 days every other week for 6 weeks. 08/25/23   Danton Lauraine LABOR, PA-C  daptomycin  (CUBICIN ) IVPB Inject 700 mg into the vein daily. Indication:  MRSA Bacteremia/septic hip/osteomyelitis of pelvis  First Dose: Yes Last Day of Therapy:  10/04/23 Labs - Once weekly:  CBC/D, BMP, and CPK Labs - Once weekly: ESR and CRP Method of administration:  IV Push Method of administration may be changed at the discretion of home infusion pharmacist based upon assessment of the patient and/or caregiver's ability to self-administer the medication ordered. 09/01/23 10/07/23  Christobal Guadalajara, MD  donepezil  (ARICEPT ) 5 MG tablet Take 5 mg by mouth daily. 04/17/21   [provider]  levothyroxine  (SYNTHROID , LEVOTHROID) 75 MCG tablet Take 1 tablet (75 mcg total) by mouth daily. 09/28/12   [name] LABOR, NP  meloxicam (MOBIC) 15 MG tablet Take 15 mg by mouth daily. 08/23/23   [provider]  metFORMIN (GLUCOPHAGE-XR) 500 MG 24 hr tablet Take 500 mg by mouth daily. 03/13/21   [provider]  mupirocin  ointment (BACTROBAN ) 2 % Place 1 Application into the nose 2 (two) times daily for 60 doses. Use as directed 2 times daily for 5 days every other week for 6 weeks. 08/25/23 09/24/23  Danton Lauraine LABOR, PA-C  omeprazole  (PRILOSEC) 20 MG capsule Take 1 capsule (20 mg total) by mouth daily. 09/28/12   [name] LABOR, NP  oxybutynin  (DITROPAN ) 5 MG tablet Take 1 tablet (5 mg total) by mouth 3 (three) times daily. 07/15/23   Ricky Fines, MD  paliperidone  (INVEGA ) 3 MG 24 hr tablet Take 1 tablet by mouth 2 (two) times daily. 03/13/21   [provider]  polyethylene glycol (MIRALAX  / GLYCOLAX ) 17 g packet Take 17 g by mouth 2 (two) times daily. 03/20/21   [name] LABOR, MD  rosuvastatin  (CRESTOR )  5 MG tablet Take 1 tablet (5 mg total) by mouth once a week. Take 5 mg by mouth every week on Friday (please hold medication until August 16, 2023 to minimize risk of rhabdomyolysis while using daptomycin ). 08/13/23   Ricky Fines, MD    Allergies: Patient has no known allergies.    Review of Systems  Skin:  Positive for wound.    Updated Vital Signs There were no vitals taken for this visit.  Physical Exam Vitals and nursing note reviewed.  Constitutional:      General: He is not in acute distress.    Appearance: He is not toxic-appearing.   HENT:     Head: Normocephalic and atraumatic.  Eyes:     General: No scleral icterus.    Conjunctiva/sclera: Conjunctivae normal.  Cardiovascular:     Rate and Rhythm: Normal rate and regular rhythm.     Pulses: Normal pulses.     Heart sounds: Normal heart sounds.  Pulmonary:     Effort: Pulmonary effort is normal. No respiratory distress.     Breath sounds: Normal breath sounds.  Abdominal:     General: Abdomen is flat. Bowel sounds are normal.     Palpations: Abdomen is soft.     Tenderness: There is no abdominal tenderness.  Musculoskeletal:     Right lower leg: No edema.     Left lower leg: No edema.  Skin:    General: Skin is warm and dry.     Findings: No lesion.     Comments: B/l heel ulcers, see photo. Intact DP pulse.  Right hip incision clean, dry, mostly intact, see photo, small oozing from center of incision.   Neurological:     General: No focal deficit present.     Mental Status: He is alert and oriented to person, place, and time. Mental status is at baseline.          (all labs ordered are listed, but only abnormal results are displayed) Labs Reviewed  CBC WITH DIFFERENTIAL/PLATELET - Abnormal; Notable for the following components:      Result Value   RBC 2.71 (*)    Hemoglobin 7.3 (*)    HCT 24.5 (*)    MCHC 29.8 (*)    RDW 18.6 (*)    All other components within normal limits  COMPREHENSIVE METABOLIC PANEL WITH GFR - Abnormal; Notable for the following components:   Sodium 134 (*)    Glucose, Bld 141 (*)    Creatinine, Ser 1.54 (*)    Albumin  2.4 (*)    GFR, Estimated 49 (*)    All other components within normal limits  SEDIMENTATION RATE  C-REACTIVE PROTEIN    EKG: None  Radiology: No results found.   Procedures   Medications Ordered in the ED - No data to display  Clinical Course as of 09/18/23 1354  Sun Sep 18, 2023  1348 [phone] nephew  [JB]    Clinical Course User Index [JB] Breon Diss, Warren SAILOR, PA-C                                  Medical Decision Making Amount and/or Complexity of Data Reviewed Labs: ordered.   This patient presents to the ED for concern of incision site problem, this involves an extensive number of treatment options, and is a complaint that carries with it a high risk of complications and morbidity.  The differential  diagnosis includes cellulitis, abscess, osteomyelitis, wound dehiscence   Co morbidities that complicate the patient evaluation  Schizophrenia, dementia, hypothyroidism, hyperlipidemia    Additional history obtained:  Additional history obtained from recent admission and discharge from 09/02/2023 in which patient had incision and washout with Dr. Kendal during his admission, he was discharged with IV daptomycin  which he has been compliant with at home.   Lab Tests:  I personally interpreted labs.  The pertinent results include:   CBC shows no leukocytosis.  Hemoglobin is 7.3 which is comparable to prior recent labs done 3 weeks ago (7.9) CMP shows baseline kidney function with creatinine of 1.5  ESR and CRP obtained.   Imaging Studies ordered:  Considered however no sign of infectious etiology or worsening pain.   Cardiac Monitoring: / EKG:  The patient was maintained on a cardiac monitor.     Consultations Obtained:  I requested consultation with the orthopedics, Dr. Beverley,  and discussed lab and imaging findings as well as pertinent plan - they recommend: Redress the area have Dr. Kendal office get him in next week. Patient will call tomorrow.    Problem List / ED Course / Critical interventions / Medication management  Patient presents to emergency room with problem with right incision status post incision and drainage with washout for right hip septic arthritis and abscess.  He is hemodynamically stable and well-appearing here.  He does not have elevated white blood cell count nor fever.  Family members report that he has been acting at baseline  at home.  Overall reassuring picture other than having small amount of oozing from incision site. No purulent drainage nor surrounding erythema that stops with direct pressure.  Will check ESR and CRP.  Will check in with orthopedics for further recommendations here or for follow-up. Both CRP and a sed rate have improved since prior labs.  No white count or sign of systemic illness.  Will redress incision site and send with Dr. Kendal follow-up. I have reviewed the patients home medicines and have made adjustments as needed   Plan F/u w/ PCP in 2-3d to ensure resolution of sx.  Patient was given return precautions. Patient stable for discharge at this time.  Patient educated on sx/dx and verbalized understanding of plan. Return to ER w/ new or worsening sx.       Final diagnoses:  Visit for wound check    ED Discharge Orders     None          Shermon Warren LOISE DEVONNA 09/18/23 1644    Yolande Lamar BROCKS, MD 09/18/23 2000

## 2023-09-18 NOTE — ED Triage Notes (Signed)
 Pt bib [facility] from home for wound check. Pt had right hip sx earlier this month due to osteomyelitis and is currently on IV abx. Pts niece is his caregiver who does routine dressing changes and states the wound has started to drain a lot today. Pt is alert and oriented, hx of dementia. Denies fever or pain

## 2023-09-19 DIAGNOSIS — N179 Acute kidney failure, unspecified: Secondary | ICD-10-CM | POA: Diagnosis not present

## 2023-09-19 DIAGNOSIS — M009 Pyogenic arthritis, unspecified: Secondary | ICD-10-CM | POA: Diagnosis not present

## 2023-09-19 DIAGNOSIS — Z791 Long term (current) use of non-steroidal anti-inflammatories (NSAID): Secondary | ICD-10-CM | POA: Diagnosis not present

## 2023-09-19 DIAGNOSIS — M609 Myositis, unspecified: Secondary | ICD-10-CM | POA: Diagnosis not present

## 2023-09-19 DIAGNOSIS — I10 Essential (primary) hypertension: Secondary | ICD-10-CM | POA: Diagnosis not present

## 2023-09-19 DIAGNOSIS — E1165 Type 2 diabetes mellitus with hyperglycemia: Secondary | ICD-10-CM | POA: Diagnosis not present

## 2023-09-19 DIAGNOSIS — E1169 Type 2 diabetes mellitus with other specified complication: Secondary | ICD-10-CM | POA: Diagnosis not present

## 2023-09-19 DIAGNOSIS — E039 Hypothyroidism, unspecified: Secondary | ICD-10-CM | POA: Diagnosis not present

## 2023-09-19 DIAGNOSIS — E782 Mixed hyperlipidemia: Secondary | ICD-10-CM | POA: Diagnosis not present

## 2023-09-19 DIAGNOSIS — L89610 Pressure ulcer of right heel, unstageable: Secondary | ICD-10-CM | POA: Diagnosis not present

## 2023-09-19 DIAGNOSIS — R7881 Bacteremia: Secondary | ICD-10-CM | POA: Diagnosis not present

## 2023-09-19 DIAGNOSIS — M86051 Acute hematogenous osteomyelitis, right femur: Secondary | ICD-10-CM | POA: Diagnosis not present

## 2023-09-19 DIAGNOSIS — Z556 Problems related to health literacy: Secondary | ICD-10-CM | POA: Diagnosis not present

## 2023-09-19 DIAGNOSIS — M71051 Abscess of bursa, right hip: Secondary | ICD-10-CM | POA: Diagnosis not present

## 2023-09-19 DIAGNOSIS — Z7984 Long term (current) use of oral hypoglycemic drugs: Secondary | ICD-10-CM | POA: Diagnosis not present

## 2023-09-19 DIAGNOSIS — K219 Gastro-esophageal reflux disease without esophagitis: Secondary | ICD-10-CM | POA: Diagnosis not present

## 2023-09-19 DIAGNOSIS — Z87891 Personal history of nicotine dependence: Secondary | ICD-10-CM | POA: Diagnosis not present

## 2023-09-19 DIAGNOSIS — Z7982 Long term (current) use of aspirin: Secondary | ICD-10-CM | POA: Diagnosis not present

## 2023-09-20 ENCOUNTER — Ambulatory Visit: Admitting: Gastroenterology

## 2023-09-20 DIAGNOSIS — M00851 Arthritis due to other bacteria, right hip: Secondary | ICD-10-CM | POA: Diagnosis not present

## 2023-09-21 ENCOUNTER — Other Ambulatory Visit: Payer: Self-pay

## 2023-09-21 ENCOUNTER — Telehealth: Payer: Self-pay

## 2023-09-21 ENCOUNTER — Encounter: Payer: Self-pay | Admitting: Internal Medicine

## 2023-09-21 ENCOUNTER — Ambulatory Visit (INDEPENDENT_AMBULATORY_CARE_PROVIDER_SITE_OTHER): Admitting: Internal Medicine

## 2023-09-21 VITALS — BP 129/76 | HR 89 | Temp 98.7°F

## 2023-09-21 DIAGNOSIS — E039 Hypothyroidism, unspecified: Secondary | ICD-10-CM | POA: Diagnosis not present

## 2023-09-21 DIAGNOSIS — B9562 Methicillin resistant Staphylococcus aureus infection as the cause of diseases classified elsewhere: Secondary | ICD-10-CM | POA: Diagnosis not present

## 2023-09-21 DIAGNOSIS — E1165 Type 2 diabetes mellitus with hyperglycemia: Secondary | ICD-10-CM | POA: Diagnosis not present

## 2023-09-21 DIAGNOSIS — M71051 Abscess of bursa, right hip: Secondary | ICD-10-CM | POA: Diagnosis not present

## 2023-09-21 DIAGNOSIS — M609 Myositis, unspecified: Secondary | ICD-10-CM | POA: Diagnosis not present

## 2023-09-21 DIAGNOSIS — R7881 Bacteremia: Secondary | ICD-10-CM | POA: Diagnosis not present

## 2023-09-21 DIAGNOSIS — I10 Essential (primary) hypertension: Secondary | ICD-10-CM | POA: Diagnosis not present

## 2023-09-21 DIAGNOSIS — M009 Pyogenic arthritis, unspecified: Secondary | ICD-10-CM | POA: Diagnosis not present

## 2023-09-21 DIAGNOSIS — L89610 Pressure ulcer of right heel, unstageable: Secondary | ICD-10-CM | POA: Diagnosis not present

## 2023-09-21 DIAGNOSIS — Z556 Problems related to health literacy: Secondary | ICD-10-CM | POA: Diagnosis not present

## 2023-09-21 DIAGNOSIS — Z87891 Personal history of nicotine dependence: Secondary | ICD-10-CM | POA: Diagnosis not present

## 2023-09-21 DIAGNOSIS — K219 Gastro-esophageal reflux disease without esophagitis: Secondary | ICD-10-CM | POA: Diagnosis not present

## 2023-09-21 DIAGNOSIS — Z7982 Long term (current) use of aspirin: Secondary | ICD-10-CM | POA: Diagnosis not present

## 2023-09-21 DIAGNOSIS — Z7984 Long term (current) use of oral hypoglycemic drugs: Secondary | ICD-10-CM | POA: Diagnosis not present

## 2023-09-21 DIAGNOSIS — Z791 Long term (current) use of non-steroidal anti-inflammatories (NSAID): Secondary | ICD-10-CM | POA: Diagnosis not present

## 2023-09-21 DIAGNOSIS — M86051 Acute hematogenous osteomyelitis, right femur: Secondary | ICD-10-CM | POA: Diagnosis not present

## 2023-09-21 DIAGNOSIS — E782 Mixed hyperlipidemia: Secondary | ICD-10-CM | POA: Diagnosis not present

## 2023-09-21 DIAGNOSIS — E1169 Type 2 diabetes mellitus with other specified complication: Secondary | ICD-10-CM | POA: Diagnosis not present

## 2023-09-21 DIAGNOSIS — N179 Acute kidney failure, unspecified: Secondary | ICD-10-CM | POA: Diagnosis not present

## 2023-09-21 MED ORDER — DOXYCYCLINE HYCLATE 100 MG PO TABS: 100.0000 mg | ORAL_TABLET | Freq: Two times a day (BID) | ORAL | 0 refills | Status: DC

## 2023-09-21 NOTE — Progress Notes (Signed)
 Patient: [name]  DOB: 06/21/1955 MRN: [id number] PCP: [name], MD      Patient Active Problem List   Diagnosis Date Noted   Bacteremia 08/26/2023   Type 2 diabetes mellitus with hyperglycemia (HCC) 08/24/2023   Acquired hypothyroidism 08/24/2023   GERD (gastroesophageal reflux disease) 08/24/2023   Mixed hyperlipidemia 08/24/2023   Acute osteomyelitis involving pelvic region and thigh (HCC) 08/24/2023   Septic arthritis of hip (HCC) 08/24/2023   Myositis 08/24/2023   Vitamin B12 deficiency 08/23/2023   Iron deficiency anemia due to chronic blood loss 08/23/2023   Weakness 07/15/2023   MRSA bacteremia 07/15/2023   Malnutrition of moderate degree 07/12/2023   Failure to thrive in adult 07/09/2023   Metabolic acidosis 07/09/2023   Protein deficiency (HCC) 07/09/2023   Normocytic anemia 07/09/2023   Lactic acidosis 07/09/2023   UTI (urinary tract infection) 06/23/2023   DM (diabetes mellitus) (HCC) 06/23/2023   Dementia without behavioral disturbance (HCC) 11/05/2021   Hypotonic bladder 07/24/2021   Urinary retention 03/27/2021   Constipation 03/27/2021   Acute metabolic encephalopathy 03/18/2021   Hypokalemia 03/18/2021   AKI (acute kidney injury) (HCC) 03/18/2021   Hyponatremia 03/18/2021   Paranoid schizophrenia, chronic condition (HCC) 09/22/2012     Subjective:  [name] is a 68 y.o. M with Hx of dementia, schizophrenia, suprapubic catheter , MRSA bacteremia treated with dapt x 4 weeks presents for MRSA bacteremia and right septic hip concern for osteomyelitis.  At last ID visit on 6/30 patient had noted to have osteomyelitis on an MRI done by Dr. Orpha along with a chronic wound tertiary care facility.  MRI on 6/26 showed possible septic arthritis/OM , severe destruction to right proximal femur and right acetabulum, mysositis, right hipfluid collection, moderate large joint effusion, possible phlegmon blood cultures obtained at ID clinic grew  MRSA as such she was sent back to the ED.  He did not go to tertiary care, went to [facility].  He underwent I&D and arthrotomy of right hip.  PICC line placed and discharged on daptomycin  x 6 weeks EOT 10/04/2023.  7/3 OR cultures grew MRSA as well. Today 09/21/2023: Tolerating abx, no missed doses. Pt presents with nephew who provides history.  Review of Systems  All other systems reviewed and are negative.   Past Medical History:  Diagnosis Date   Dementia (HCC)    Schizophrenia (HCC)     Outpatient Medications Prior to Visit  Medication Sig Dispense Refill   aspirin  EC 81 MG tablet Take 1 tablet (81 mg total) by mouth 2 (two) times daily for 28 days. Swallow whole.     benztropine  (COGENTIN ) 2 MG tablet Take 1 tablet (2 mg total) by mouth at bedtime. 30 tablet 0   chlorhexidine  (HIBICLENS ) 4 % external liquid Apply 15 mLs (1 Application total) topically as directed for 30 doses. Use as directed daily for 5 days every other week for 6 weeks. 946 mL 1   daptomycin  (CUBICIN ) IVPB Inject 700 mg into the vein daily. Indication:  MRSA Bacteremia/septic hip/osteomyelitis of pelvis  First Dose: Yes Last Day of Therapy:  10/04/23 Labs - Once weekly:  CBC/D, BMP, and CPK Labs - Once weekly: ESR and CRP Method of administration: IV Push Method of administration may be changed at the discretion of home infusion pharmacist based upon assessment of the patient and/or caregiver's ability to self-administer the medication ordered. 36 Units 0   donepezil  (ARICEPT ) 5 MG tablet Take 5 mg by  mouth daily.     levothyroxine  (SYNTHROID , LEVOTHROID) 75 MCG tablet Take 1 tablet (75 mcg total) by mouth daily.     meloxicam (MOBIC) 15 MG tablet Take 15 mg by mouth daily.     metFORMIN (GLUCOPHAGE-XR) 500 MG 24 hr tablet Take 500 mg by mouth daily.     mupirocin  ointment (BACTROBAN ) 2 % Place 1 Application into the nose 2 (two) times daily for 60 doses. Use as directed 2 times daily for 5 days every other week for  6 weeks. 60 g 0   omeprazole  (PRILOSEC) 20 MG capsule Take 1 capsule (20 mg total) by mouth daily.     oxybutynin  (DITROPAN ) 5 MG tablet Take 1 tablet (5 mg total) by mouth 3 (three) times daily. 90 tablet 0   paliperidone  (INVEGA ) 3 MG 24 hr tablet Take 1 tablet by mouth 2 (two) times daily.     polyethylene glycol (MIRALAX  / GLYCOLAX ) 17 g packet Take 17 g by mouth 2 (two) times daily. 14 each 0   rosuvastatin  (CRESTOR ) 5 MG tablet Take 1 tablet (5 mg total) by mouth once a week. Take 5 mg by mouth every week on Friday (please hold medication until August 16, 2023 to minimize risk of rhabdomyolysis while using daptomycin ).     No facility-administered medications prior to visit.     No Known Allergies  Social History   Tobacco Use   Smoking status: Former    Current packs/day: 1.00    Types: Cigarettes  Vaping Use   Vaping status: Never Used  Substance Use Topics   Alcohol  use: Yes    Comment: occasionaly   Drug use: No    No family history on file.  Objective:  There were no vitals filed for this visit. There is no height or weight on file to calculate BMI.  Physical Exam Constitutional:      General: He is not in acute distress.    Appearance: He is normal weight. He is not toxic-appearing.  HENT:     Head: Normocephalic and atraumatic.     Right Ear: External ear normal.     Left Ear: External ear normal.     Nose: No congestion or rhinorrhea.     Mouth/Throat:     Mouth: Mucous membranes are moist.     Pharynx: Oropharynx is clear.  Eyes:     Extraocular Movements: Extraocular movements intact.     Conjunctiva/sclera: Conjunctivae normal.     Pupils: Pupils are equal, round, and reactive to light.  Cardiovascular:     Rate and Rhythm: Normal rate and regular rhythm.     Heart sounds: No murmur heard.    No friction rub. No gallop.  Pulmonary:     Effort: Pulmonary effort is normal.     Breath sounds: Normal breath sounds.  Abdominal:     General: Abdomen is  flat. Bowel sounds are normal.     Palpations: Abdomen is soft.  Musculoskeletal:        General: No swelling.     Cervical back: Normal range of motion and neck supple.     Comments: Suprapubic catheter In wheelchair  Skin:    General: Skin is warm and dry.  Neurological:     General: No focal deficit present.     Mental Status: He is oriented to person, place, and time.  Psychiatric:        Mood and Affect: Mood normal.     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  He presented with an MRI showing possible septic arthritis/OM , severe destruction to right proximal femur and right acetabulum, mysositis, right hipfluid collection, moderate large joint effusion, possible phlegmon.  At that point he had completed 6 weeks of antibiotics 2/18.  She had elevated WBC on 6/18 as well.  We got blood cultures which grew MRSA sent back to the ED. -Reviewed OR note from 7/3 he underwent I&D of right hip septic arthritis with destructive lesion of the acetabulum femoral head IntraOp findings showed phlegmonous material without gross purulence - During hospitalization he underwent I&D arthrotomy with culture growing MRSA as well. - Discharged on daptomycin  x 6 weeks EOT  10/04/2023. -Seen by Dr Octavio in the interim, butterfly stiches placed. Sees Dr hendricks again on 8/4. - No hardware in place but given recurrent bacteremia and unclear if  source control achieved, elevated esr/crp will transition to doxy x 1 month and ID follow up with Dr. Fleeta Rothman in one month -Counseled sign of infection from hip to let ID know as well as surgery.   #Medication manage #PICC line placement #CKD(baseline scr 1.5) -labs: wbc 7.4, scr 1.64, esr 86, ck 30 -Pull picc after last dose of abx then start doxy  Loney Stank, MD [facility]   09/21/23  6:25 AM   I have personally spent 50 minutes involved in face-to-face and non-face-to-face activities for this patient on the day of the visit. Professional time spent includes the following activities: Preparing to see the patient (review of tests), Obtaining and/or reviewing separately obtained history (admission/discharge record), Performing a medically appropriate examination and/or evaluation , Ordering medications/tests/procedures, referring and communicating with other health care professionals, Documenting clinical information in the EMR, Independently interpreting results (not separately reported), Communicating results to the patient/family/caregiver, Counseling and educating the patient/family/caregiver and Care coordination (not separately reported).

## 2023-09-21 NOTE — Telephone Encounter (Signed)
 Per Dr.Singh picc can be pull after last dose 8/12. Message sent to [employer] with orders. Lorenda CHRISTELLA Code, RMA

## 2023-09-21 NOTE — Patient Instructions (Signed)
 IV antibioics end on 8/12 then on 8/13 start doxycycline  twice a day till seen by infectious Disease.

## 2023-09-23 DIAGNOSIS — E1165 Type 2 diabetes mellitus with hyperglycemia: Secondary | ICD-10-CM | POA: Diagnosis not present

## 2023-09-23 DIAGNOSIS — R7881 Bacteremia: Secondary | ICD-10-CM | POA: Diagnosis not present

## 2023-09-26 DIAGNOSIS — N179 Acute kidney failure, unspecified: Secondary | ICD-10-CM | POA: Diagnosis not present

## 2023-09-26 DIAGNOSIS — E1169 Type 2 diabetes mellitus with other specified complication: Secondary | ICD-10-CM | POA: Diagnosis not present

## 2023-09-26 DIAGNOSIS — Z7982 Long term (current) use of aspirin: Secondary | ICD-10-CM | POA: Diagnosis not present

## 2023-09-26 DIAGNOSIS — I10 Essential (primary) hypertension: Secondary | ICD-10-CM | POA: Diagnosis not present

## 2023-09-26 DIAGNOSIS — E782 Mixed hyperlipidemia: Secondary | ICD-10-CM | POA: Diagnosis not present

## 2023-09-26 DIAGNOSIS — Z7984 Long term (current) use of oral hypoglycemic drugs: Secondary | ICD-10-CM | POA: Diagnosis not present

## 2023-09-26 DIAGNOSIS — M609 Myositis, unspecified: Secondary | ICD-10-CM | POA: Diagnosis not present

## 2023-09-26 DIAGNOSIS — Z556 Problems related to health literacy: Secondary | ICD-10-CM | POA: Diagnosis not present

## 2023-09-26 DIAGNOSIS — M009 Pyogenic arthritis, unspecified: Secondary | ICD-10-CM | POA: Diagnosis not present

## 2023-09-26 DIAGNOSIS — M86051 Acute hematogenous osteomyelitis, right femur: Secondary | ICD-10-CM | POA: Diagnosis not present

## 2023-09-26 DIAGNOSIS — R7881 Bacteremia: Secondary | ICD-10-CM | POA: Diagnosis not present

## 2023-09-26 DIAGNOSIS — Z87891 Personal history of nicotine dependence: Secondary | ICD-10-CM | POA: Diagnosis not present

## 2023-09-26 DIAGNOSIS — E039 Hypothyroidism, unspecified: Secondary | ICD-10-CM | POA: Diagnosis not present

## 2023-09-26 DIAGNOSIS — K219 Gastro-esophageal reflux disease without esophagitis: Secondary | ICD-10-CM | POA: Diagnosis not present

## 2023-09-26 DIAGNOSIS — L89610 Pressure ulcer of right heel, unstageable: Secondary | ICD-10-CM | POA: Diagnosis not present

## 2023-09-26 DIAGNOSIS — M71051 Abscess of bursa, right hip: Secondary | ICD-10-CM | POA: Diagnosis not present

## 2023-09-26 DIAGNOSIS — Z791 Long term (current) use of non-steroidal anti-inflammatories (NSAID): Secondary | ICD-10-CM | POA: Diagnosis not present

## 2023-09-26 DIAGNOSIS — E1165 Type 2 diabetes mellitus with hyperglycemia: Secondary | ICD-10-CM | POA: Diagnosis not present

## 2023-09-28 ENCOUNTER — Encounter: Payer: Self-pay | Admitting: Gastroenterology

## 2023-09-28 ENCOUNTER — Other Ambulatory Visit: Payer: Self-pay | Admitting: *Deleted

## 2023-09-28 ENCOUNTER — Ambulatory Visit (INDEPENDENT_AMBULATORY_CARE_PROVIDER_SITE_OTHER): Admitting: Gastroenterology

## 2023-09-28 VITALS — BP 108/70 | HR 102 | Temp 97.4°F | Ht 70.0 in | Wt 200.0 lb

## 2023-09-28 DIAGNOSIS — K5909 Other constipation: Secondary | ICD-10-CM

## 2023-09-28 DIAGNOSIS — E538 Deficiency of other specified B group vitamins: Secondary | ICD-10-CM | POA: Diagnosis not present

## 2023-09-28 DIAGNOSIS — Z800 Family history of malignant neoplasm of digestive organs: Secondary | ICD-10-CM | POA: Diagnosis not present

## 2023-09-28 DIAGNOSIS — D500 Iron deficiency anemia secondary to blood loss (chronic): Secondary | ICD-10-CM

## 2023-09-28 DIAGNOSIS — D649 Anemia, unspecified: Secondary | ICD-10-CM

## 2023-09-28 DIAGNOSIS — K5900 Constipation, unspecified: Secondary | ICD-10-CM

## 2023-09-28 MED ORDER — VITAMIN B-12 100 MCG PO TABS: 100.0000 ug | ORAL_TABLET | Freq: Every day | ORAL | 2 refills | Status: AC

## 2023-09-28 MED ORDER — FERROUS SULFATE 324 (65 FE) MG PO TBEC: 1.0000 | DELAYED_RELEASE_TABLET | Freq: Every day | ORAL | 3 refills | Status: DC

## 2023-09-28 NOTE — Progress Notes (Signed)
 GI Office Note    Referring Provider: [name], MD Primary Care Physician:  [name], MD Primary Gastroenterologist: Deatrice Casilda Dine, MD  Date:  09/28/2023  ID:  [name], DOB 1955-09-29, MRN [id number]  Chief Complaint   Chief Complaint  Patient presents with   Follow-up   History of Present Illness  [name] is a 68 y.o. male with a history of dementia, schizophrenia, HTN, hypothyroidism, diabetes, chronic indwelling suprapubic catheter with bacteremia, septic arthritis and possible osteomyelitis of right hip wound presenting today with complaint of   EGD: Never Colonoscopy: Never  CT chest, abdomen, pelvis Jul 09, 2023: - Moderate splenomegaly - Large amount of stool seen throughout the colon suggesting constipation - Stable extensive bony destruction of right femoral head noted with superior subluxation of the right femoral head relative to Aceta bellum with stable prominence of medial musculature and soft tissues of the right hip may represent joint effusion although findings concerning for chronic arthropathy of right hip  Last office visit 08/23/2023 for evaluation of acute on chronic anemia.  Hemoglobin normal in 2023 but continuing to downtrend.  Had received blood transfusion x 2 in June 2025 at [facility].  He presented with his nephew who helps provide history and his caretaker.  Unsure about color of stools but denied any overt blood or dark-colored stools.  Denied any NSAIDs.  Also denied any nausea, vomiting, fever, chills, hematemesis, abdominal distention, abdominal pain, diarrhea, jaundice, and pruritus.  CRP elevated at 72 but likely secondary to wound infection.  Noted a family history of colon cancer in patient's father at age 67.  No prior abdominal surgeries and no current or recent alcohol , illicit drug use, or smoking.  Was recommended to have endoscopy and colonoscopy for workup of severe anemia as well as family history of colon cancer.  They  initially deferred procedures by nephew given the issue with wound healing and osteomyelitis.  Given B12 deficiency also advised B12 supplementation and recommended starting on MiraLAX  twice daily given he is wheelchair-bound and prior CT scan with significant stool burden.  He was admitted to the hospital in 08/24/2023 due to outpatient wound cultures growing MRSA.  He underwent I&D and arthrotomy of his right hip on 7/3.  These OR cultures also grew MRSA.  He was given a PICC line and discharged with daptomycin  IV for 6 weeks.  Seen by infectious disease 09/21/2023 with nephew providing history who recommended continuing patient's IV daptomycin  which ends on 8/12 and on 8/13 and she will start oral doxycycline  twice daily until his follow-up.     Latest Ref Rng & Units 09/18/2023    1:21 PM 08/28/2023    3:54 AM 08/27/2023    3:47 AM 08/26/2023    4:13 AM 08/25/2023    4:33 AM 08/24/2023    2:46 AM 08/22/2023    3:20 PM  CBC  WBC 4.0 - 10.5 K/uL 7.4  9.3  10.6  9.6  11.6  10.1  11.3   Hemoglobin 13.0 - 17.0 g/dL 7.3  7.9  8.0  7.9  7.6  7.7  8.2   Hematocrit 39.0 - 52.0 % 24.5  24.6  25.4  25.4  24.8  25.4  27.3   Platelets 150 - 400 K/uL 192  422  438  407  408  352  367    Labs in May 2025 with vitamin B12 145, folate 10.5, ferritin 676, iron 15, TIBC 163, iron saturation 9%.  Today:  Discussed the use of AI scribe software for clinical note transcription with the patient, who gave verbal consent to proceed.  He has a history of anemia, with recent blood work revealing vitamin B12 deficiency (B12 level of 145), normal folate, low iron levels (15), and low iron saturation (9%). His hemoglobin was in the 7 range and remains there. Further evaluation of anemia, including EGD and colonoscopy, was delayed due to ongoing wound issues.  He underwent right hip arthroscopy on July 3rd after being sent to the ED on July 2nd possible osteomyelitis and active MRSA infection of the right hip. Cultures of the  wound from surgery revealed MRSA as well. He was discharged home with a PICC line for IV daptomycin  for six weeks. He was seen by infectious disease on July 30th, with a recommendation to continue daptomycin  until August 12th, followed by oral doxycycline . There is a small amount of bleeding from the hip wound, requiring dressing changes once a day.  He is wheelchair-bound and has been experiencing constipation. He was recommended to start MiraLAX  twice daily. His bowel movements have improved recently with MiraLAX  as needed, with good movements reported in the past few days. He uses a bedside commode at home which he is able to pivot from chair or bed to.   He is currently on an 81 mg baby aspirin  and metformin once daily, likely in the morning. He has a good appetite. No nausea, vomiting, chest pain, shortness of breath, blood in stool, or swelling in the belly, legs, or feet.      Wt Readings from Last 5 Encounters:  09/28/23 200 lb (90.7 kg)  08/25/23 (P) 183 lb 6.8 oz (83.2 kg)  08/23/23 200 lb (90.7 kg)  07/17/23 191 lb 12.8 oz (87 kg)  07/09/23 191 lb 12.8 oz (87 kg)    Current Outpatient Medications  Medication Sig Dispense Refill   aspirin  81 MG chewable tablet Chew 81 mg by mouth daily.     benztropine  (COGENTIN ) 2 MG tablet Take 1 tablet (2 mg total) by mouth at bedtime. 30 tablet 0   chlorhexidine  (HIBICLENS ) 4 % external liquid Apply 15 mLs (1 Application total) topically as directed for 30 doses. Use as directed daily for 5 days every other week for 6 weeks. 946 mL 1   DAPTOmycin  (CUBICIN ) 500 MG injection Inject 500 mg into the vein.     daptomycin  (CUBICIN ) IVPB Inject 700 mg into the vein daily. Indication:  MRSA Bacteremia/septic hip/osteomyelitis of pelvis  First Dose: Yes Last Day of Therapy:  10/04/23 Labs - Once weekly:  CBC/D, BMP, and CPK Labs - Once weekly: ESR and CRP Method of administration: IV Push Method of administration may be changed at the discretion of  home infusion pharmacist based upon assessment of the patient and/or caregiver's ability to self-administer the medication ordered. 36 Units 0   donepezil  (ARICEPT ) 5 MG tablet Take 5 mg by mouth daily.     ferrous sulfate  324 (65 Fe) MG TBEC Take 1 tablet (325 mg total) by mouth daily. 30 tablet 3   HYDROcodone -acetaminophen  (NORCO/VICODIN) 5-325 MG tablet Take 1 tablet by mouth every 6 (six) hours as needed.     levothyroxine  (SYNTHROID , LEVOTHROID) 75 MCG tablet Take 1 tablet (75 mcg total) by mouth daily.     meloxicam (MOBIC) 15 MG tablet Take 15 mg by mouth daily.     metFORMIN (GLUCOPHAGE-XR) 500 MG 24 hr tablet Take 500 mg by mouth daily.  mupirocin  ointment (BACTROBAN ) 2 % Apply 1 Application topically daily.     omeprazole  (PRILOSEC) 20 MG capsule Take 1 capsule (20 mg total) by mouth daily.     oxybutynin  (DITROPAN ) 5 MG tablet Take 1 tablet (5 mg total) by mouth 3 (three) times daily. 90 tablet 0   paliperidone  (INVEGA ) 3 MG 24 hr tablet Take 1 tablet by mouth 2 (two) times daily.     polyethylene glycol (MIRALAX  / GLYCOLAX ) 17 g packet Take 17 g by mouth 2 (two) times daily. 14 each 0   QUEtiapine  (SEROQUEL ) 200 MG tablet Take 600 mg by mouth at bedtime.     rosuvastatin  (CRESTOR ) 5 MG tablet Take 1 tablet (5 mg total) by mouth once a week. Take 5 mg by mouth every week on Friday (please hold medication until August 16, 2023 to minimize risk of rhabdomyolysis while using daptomycin ).     vitamin B-12 (CYANOCOBALAMIN ) 100 MCG tablet Take 1 tablet (100 mcg total) by mouth daily. 30 tablet 2   No current facility-administered medications for this visit.    Past Medical History:  Diagnosis Date   Dementia (HCC)    Schizophrenia (HCC)     Past Surgical History:  Procedure Laterality Date   INCISION AND DRAINAGE OF DEEP ABSCESS, HIP JOINT, ANTERIOR APPROACH Right 08/25/2023   Procedure: INCISION AND DRAINAGE OF DEEP ABSCESS, HIP JOINT, ANTERIOR APPROACH;  Surgeon: Kendal Franky SQUIBB,  MD;  Location: [facility] OR;  Service: Orthopedics;  Laterality: Right;   IR CATHETER TUBE CHANGE  08/05/2021   SHOULDER SURGERY      History reviewed. No pertinent family history.  Allergies as of 09/28/2023   (No Known Allergies)    Social History   Socioeconomic History   Marital status: Single    Spouse name: Not on file   Number of children: Not on file   Years of education: Not on file   Highest education level: Not on file  Occupational History   Not on file  Tobacco Use   Smoking status: Former    Current packs/day: 1.00    Types: Cigarettes   Smokeless tobacco: Not on file  Vaping Use   Vaping status: Never Used  Substance and Sexual Activity   Alcohol  use: Yes    Comment: occasionaly   Drug use: No   Sexual activity: Not on file  Other Topics Concern   Not on file  Social History Narrative   Not on file   Social Drivers of Health   Financial Resource Strain: Medium Risk (05/19/2022)   Received from [facility]   Overall Financial Resource Strain (CARDIA)    Difficulty of Paying Living Expenses: Somewhat hard  Food Insecurity: No Food Insecurity (08/24/2023)   Hunger Vital Sign    Worried About Running Out of Food in the Last Year: Never true    Ran Out of Food in the Last Year: Never true  Transportation Needs: No Transportation Needs (08/24/2023)   PRAPARE - Administrator, Civil Service (Medical): No    Lack of Transportation (Non-Medical): No  Physical Activity: Not on file  Stress: Not on file  Social Connections: Unknown (08/24/2023)   Social Connection and Isolation Panel    Frequency of Communication with Friends and Family: Twice a week    Frequency of Social Gatherings with Friends and Family: Twice a week    Attends Religious Services: 1 to 4 times per year    Active Member of [employer] or Organizations:  No    Attends Banker Meetings: Never    Marital Status: Patient declined   Review of Systems   Gen: Denies fever,  chills, anorexia. Denies fatigue, weakness, weight loss.  CV: Denies chest pain, palpitations, syncope, peripheral edema, and claudication. Resp: Denies dyspnea at rest, cough, wheezing, coughing up blood, and pleurisy. GI: See HPI Derm: + wound to right hip. Denies rash, itching, dry skin Neuro/Psych: + confusion and memory loss. Denies depression, anxiety. No homicidal or suicidal ideation.  Heme: + bleeding hip wound. Denies bruising and enlarged lymph nodes.  Physical Exam   BP 108/70 (BP Location: Left Arm, Patient Position: Sitting, Cuff Size: Normal)   Pulse (!) 102   Temp (!) 97.4 F (36.3 C) (Oral)   Ht 5' 10 (1.778 m)   Wt 200 lb (90.7 kg) Comment: per guardian  SpO2 99%   BMI 28.70 kg/m   General:   Alert. No distress noted. Chronically ill appearing. Frail. Pleasant and cooperative.  Head:  Normocephalic and atraumatic. Eyes:  Conjuctiva clear without scleral icterus. Mouth:  Oral mucosa pink and moist. Good dentition. No lesions. Lungs:  Clear to auscultation bilaterally. No wheezes, rales, or rhonchi. No distress.  Heart:  S1, S2 present without murmurs appreciated.  Abdomen:  +BS, soft, non-tender and non-distended. No rebound or guarding. No HSM or masses noted. Suprapubic catheter present, dry and intact.  Rectal: deferred Msk: Sitting in wheelchair.  Extremities:  Without edema. Neurologic:  Alert and oriented to self and place.  Psych:  Alert and cooperative. Interactive.   Assessment & Plan  AHMAUD DUTHIE is a 68 y.o. male presenting today for follow up of IDA and discuss scheduling procedures.     Anemia with iron and vitamin B12 deficiency Chronic anemia with iron and vitamin B12 deficiency. Hemoglobin levels remain in the 7-8 range. Recent hip surgery and ongoing wound issues may be contributing to anemia. Family history of colon cancer raises concern for potential gastrointestinal bleeding or malignancy. Bidirectional endoscopy recommended for further  evaluation given degree of anemia. Differential includes gastrointestinal bleeding secondary to polyps, vascular anomalies, chronic inflammation, PUD, malabsorption, or nutritional deficiency. - Start over-the-counter iron supplement once daily to improve iron stores. Prescription sent to pharmacy.  - Start oral B12 supplementation, prescription sent to pharmacy.  - Schedule upper endoscopy and colonoscopy for end of September or early October per Nephew request to evaluate anemia and screen for colon cancer.   Proceed with colonoscopy with propofol  by Dr. Cinderella in near future: the risks, benefits, and alternatives have been discussed with the patient in detail. The patient states understanding and desires to proceed. ASA 3 Hold metformin morning of procedure Hold iron 1 week prior to procedure MiraLAX  twice daily 1 week prior to procedure  Constipation Chronic constipation with recent improvement in bowel movements recently. Previous CT scan showed large amount of stool in the colon. Currently managed with MiraLAX  as needed. - Administer MiraLAX  twice daily for one week prior to colonoscopy preparation to ensure complete bowel cleansing. - Continue MiraLAX  17g as needed, may need to consider daily administration.     Follow up   Follow up post procedures CBC, Iron panel, B12 level in 3 months.   Charmaine Melia, MSN, FNP-BC, AGACNP-BC [facility]

## 2023-09-28 NOTE — Patient Instructions (Signed)
 VISIT SUMMARY:  Today, you came in for a follow-up visit regarding your anemia and recent hip surgery. You were accompanied by your nephew. We discussed your ongoing anemia, recent hip surgery, and constipation issues.  YOUR PLAN:  -ANEMIA WITH IRON AND VITAMIN B12 DEFICIENCY: Anemia is a condition where you don't have enough healthy red blood cells to carry adequate oxygen to your body's tissues. Your anemia is due to low levels of iron and vitamin B12 and likely secondary to chronic hip procedures and infection. We will schedule an upper endoscopy and colonoscopy for the end of September or early October to check for any potential sources of bleeding or other issues in your gastrointestinal tract. You should start taking an over-the-counter iron supplement once daily to improve your iron levels, but stop taking it one week before the procedure. Detailed instructions for the colonoscopy preparation will be provided, and you should not take metformin on the morning of the procedure.  I also want to make sure you are taking a daily vitamin B12 supplement, 1000 mcg once daily.  I sent prescription to [facility] however they may direct you to get this over-the-counter.  -CONSTIPATION: Constipation is when you have infrequent or difficult bowel movements. Your bowel movements have improved recently with the use of Miralax  as needed. To ensure your bowels are completely cleansed before the colonoscopy, take Miralax  twice daily for one week before the procedure.  INSTRUCTIONS:  Please follow up with the scheduled upper endoscopy and colonoscopy at the end of September or early October.  This will be performed at [facility].  Stop taking the iron supplement one week before the procedure and hold metformin on the morning of the procedure. Continue taking Miralax  twice daily for one week prior to the colonoscopy to ensure complete bowel cleansing.  We will plan to have a follow-up after procedures.   Recheck CBC, iron panel, and B12 in 3 months.  It was a pleasure to see you today. I want to create trusting relationships with patients. If you receive a survey regarding your visit,  I greatly appreciate you taking time to fill this out on paper or through your MyChart. I value your feedback.  Charmaine Melia, MSN, FNP-BC, AGACNP-BC [facility]

## 2023-09-29 ENCOUNTER — Other Ambulatory Visit: Payer: Self-pay | Admitting: Internal Medicine

## 2023-09-29 DIAGNOSIS — M009 Pyogenic arthritis, unspecified: Secondary | ICD-10-CM | POA: Diagnosis not present

## 2023-09-29 DIAGNOSIS — Z7984 Long term (current) use of oral hypoglycemic drugs: Secondary | ICD-10-CM | POA: Diagnosis not present

## 2023-09-29 DIAGNOSIS — M609 Myositis, unspecified: Secondary | ICD-10-CM | POA: Diagnosis not present

## 2023-09-29 DIAGNOSIS — E1169 Type 2 diabetes mellitus with other specified complication: Secondary | ICD-10-CM | POA: Diagnosis not present

## 2023-09-29 DIAGNOSIS — R7881 Bacteremia: Secondary | ICD-10-CM | POA: Diagnosis not present

## 2023-09-29 DIAGNOSIS — K219 Gastro-esophageal reflux disease without esophagitis: Secondary | ICD-10-CM | POA: Diagnosis not present

## 2023-09-29 DIAGNOSIS — M86051 Acute hematogenous osteomyelitis, right femur: Secondary | ICD-10-CM | POA: Diagnosis not present

## 2023-09-29 DIAGNOSIS — N179 Acute kidney failure, unspecified: Secondary | ICD-10-CM | POA: Diagnosis not present

## 2023-09-29 DIAGNOSIS — M71051 Abscess of bursa, right hip: Secondary | ICD-10-CM | POA: Diagnosis not present

## 2023-09-29 DIAGNOSIS — I10 Essential (primary) hypertension: Secondary | ICD-10-CM | POA: Diagnosis not present

## 2023-09-29 DIAGNOSIS — E1165 Type 2 diabetes mellitus with hyperglycemia: Secondary | ICD-10-CM | POA: Diagnosis not present

## 2023-09-29 DIAGNOSIS — E039 Hypothyroidism, unspecified: Secondary | ICD-10-CM | POA: Diagnosis not present

## 2023-09-29 DIAGNOSIS — Z791 Long term (current) use of non-steroidal anti-inflammatories (NSAID): Secondary | ICD-10-CM | POA: Diagnosis not present

## 2023-09-29 DIAGNOSIS — Z87891 Personal history of nicotine dependence: Secondary | ICD-10-CM | POA: Diagnosis not present

## 2023-09-29 DIAGNOSIS — E782 Mixed hyperlipidemia: Secondary | ICD-10-CM | POA: Diagnosis not present

## 2023-09-29 DIAGNOSIS — L89610 Pressure ulcer of right heel, unstageable: Secondary | ICD-10-CM | POA: Diagnosis not present

## 2023-09-29 DIAGNOSIS — Z7982 Long term (current) use of aspirin: Secondary | ICD-10-CM | POA: Diagnosis not present

## 2023-09-29 DIAGNOSIS — Z556 Problems related to health literacy: Secondary | ICD-10-CM | POA: Diagnosis not present

## 2023-09-30 DIAGNOSIS — R7881 Bacteremia: Secondary | ICD-10-CM | POA: Diagnosis not present

## 2023-09-30 DIAGNOSIS — E1165 Type 2 diabetes mellitus with hyperglycemia: Secondary | ICD-10-CM | POA: Diagnosis not present

## 2023-10-03 DIAGNOSIS — M009 Pyogenic arthritis, unspecified: Secondary | ICD-10-CM | POA: Diagnosis not present

## 2023-10-03 DIAGNOSIS — Z791 Long term (current) use of non-steroidal anti-inflammatories (NSAID): Secondary | ICD-10-CM | POA: Diagnosis not present

## 2023-10-03 DIAGNOSIS — Z7982 Long term (current) use of aspirin: Secondary | ICD-10-CM | POA: Diagnosis not present

## 2023-10-03 DIAGNOSIS — E1169 Type 2 diabetes mellitus with other specified complication: Secondary | ICD-10-CM | POA: Diagnosis not present

## 2023-10-03 DIAGNOSIS — K219 Gastro-esophageal reflux disease without esophagitis: Secondary | ICD-10-CM | POA: Diagnosis not present

## 2023-10-03 DIAGNOSIS — L89610 Pressure ulcer of right heel, unstageable: Secondary | ICD-10-CM | POA: Diagnosis not present

## 2023-10-03 DIAGNOSIS — M609 Myositis, unspecified: Secondary | ICD-10-CM | POA: Diagnosis not present

## 2023-10-03 DIAGNOSIS — N179 Acute kidney failure, unspecified: Secondary | ICD-10-CM | POA: Diagnosis not present

## 2023-10-03 DIAGNOSIS — Z7984 Long term (current) use of oral hypoglycemic drugs: Secondary | ICD-10-CM | POA: Diagnosis not present

## 2023-10-03 DIAGNOSIS — Z556 Problems related to health literacy: Secondary | ICD-10-CM | POA: Diagnosis not present

## 2023-10-03 DIAGNOSIS — Z87891 Personal history of nicotine dependence: Secondary | ICD-10-CM | POA: Diagnosis not present

## 2023-10-03 DIAGNOSIS — M71051 Abscess of bursa, right hip: Secondary | ICD-10-CM | POA: Diagnosis not present

## 2023-10-03 DIAGNOSIS — E1165 Type 2 diabetes mellitus with hyperglycemia: Secondary | ICD-10-CM | POA: Diagnosis not present

## 2023-10-03 DIAGNOSIS — E039 Hypothyroidism, unspecified: Secondary | ICD-10-CM | POA: Diagnosis not present

## 2023-10-03 DIAGNOSIS — M86051 Acute hematogenous osteomyelitis, right femur: Secondary | ICD-10-CM | POA: Diagnosis not present

## 2023-10-03 DIAGNOSIS — I10 Essential (primary) hypertension: Secondary | ICD-10-CM | POA: Diagnosis not present

## 2023-10-03 DIAGNOSIS — R7881 Bacteremia: Secondary | ICD-10-CM | POA: Diagnosis not present

## 2023-10-03 DIAGNOSIS — E782 Mixed hyperlipidemia: Secondary | ICD-10-CM | POA: Diagnosis not present

## 2023-10-04 ENCOUNTER — Telehealth: Payer: Self-pay | Admitting: *Deleted

## 2023-10-04 NOTE — Telephone Encounter (Signed)
 Called pt to schedule him for September. He states he wants to wait until October.  TCS/EGD w/Dr.Ahmed, asa 3

## 2023-10-06 DIAGNOSIS — Z7982 Long term (current) use of aspirin: Secondary | ICD-10-CM | POA: Diagnosis not present

## 2023-10-06 DIAGNOSIS — E7800 Pure hypercholesterolemia, unspecified: Secondary | ICD-10-CM | POA: Diagnosis not present

## 2023-10-06 DIAGNOSIS — N179 Acute kidney failure, unspecified: Secondary | ICD-10-CM | POA: Diagnosis not present

## 2023-10-06 DIAGNOSIS — K219 Gastro-esophageal reflux disease without esophagitis: Secondary | ICD-10-CM | POA: Diagnosis not present

## 2023-10-06 DIAGNOSIS — L89610 Pressure ulcer of right heel, unstageable: Secondary | ICD-10-CM | POA: Diagnosis not present

## 2023-10-06 DIAGNOSIS — M86051 Acute hematogenous osteomyelitis, right femur: Secondary | ICD-10-CM | POA: Diagnosis not present

## 2023-10-06 DIAGNOSIS — M009 Pyogenic arthritis, unspecified: Secondary | ICD-10-CM | POA: Diagnosis not present

## 2023-10-06 DIAGNOSIS — E782 Mixed hyperlipidemia: Secondary | ICD-10-CM | POA: Diagnosis not present

## 2023-10-06 DIAGNOSIS — E1169 Type 2 diabetes mellitus with other specified complication: Secondary | ICD-10-CM | POA: Diagnosis not present

## 2023-10-06 DIAGNOSIS — M71051 Abscess of bursa, right hip: Secondary | ICD-10-CM | POA: Diagnosis not present

## 2023-10-06 DIAGNOSIS — E1165 Type 2 diabetes mellitus with hyperglycemia: Secondary | ICD-10-CM | POA: Diagnosis not present

## 2023-10-06 DIAGNOSIS — M609 Myositis, unspecified: Secondary | ICD-10-CM | POA: Diagnosis not present

## 2023-10-06 DIAGNOSIS — E119 Type 2 diabetes mellitus without complications: Secondary | ICD-10-CM | POA: Diagnosis not present

## 2023-10-06 DIAGNOSIS — Z791 Long term (current) use of non-steroidal anti-inflammatories (NSAID): Secondary | ICD-10-CM | POA: Diagnosis not present

## 2023-10-06 DIAGNOSIS — Z299 Encounter for prophylactic measures, unspecified: Secondary | ICD-10-CM | POA: Diagnosis not present

## 2023-10-06 DIAGNOSIS — I10 Essential (primary) hypertension: Secondary | ICD-10-CM | POA: Diagnosis not present

## 2023-10-06 DIAGNOSIS — R7881 Bacteremia: Secondary | ICD-10-CM | POA: Diagnosis not present

## 2023-10-06 DIAGNOSIS — Z87891 Personal history of nicotine dependence: Secondary | ICD-10-CM | POA: Diagnosis not present

## 2023-10-06 DIAGNOSIS — E039 Hypothyroidism, unspecified: Secondary | ICD-10-CM | POA: Diagnosis not present

## 2023-10-06 DIAGNOSIS — Z7984 Long term (current) use of oral hypoglycemic drugs: Secondary | ICD-10-CM | POA: Diagnosis not present

## 2023-10-06 DIAGNOSIS — N3281 Overactive bladder: Secondary | ICD-10-CM | POA: Diagnosis not present

## 2023-10-06 DIAGNOSIS — Z556 Problems related to health literacy: Secondary | ICD-10-CM | POA: Diagnosis not present

## 2023-10-12 ENCOUNTER — Ambulatory Visit

## 2023-10-12 DIAGNOSIS — R339 Retention of urine, unspecified: Secondary | ICD-10-CM | POA: Diagnosis not present

## 2023-10-12 NOTE — Progress Notes (Signed)
 Suprapubic Cath Change  Patient is present today for a suprapubic catheter change due to urinary retention.  10 ml of water was drained from the balloon, a 16 FR foley cath was removed from the tract with out difficulty.  Suprapubic catheter site was cleaned and prepped in a sterile fashion with Betadinex3  A 16 FR foley cath was replaced into the tract no complications were noted. Urine return was noted, urine Clear yellow in color . 10 ml of sterile water was inflated into the balloon and a night bag was attached for drainage.  Patient tolerated well. A night bag was given to patient and proper instruction was given on how to switch bags.    Performed by: Carlos, CMA  Follow up: Keep Monthly SP TUBE cath change

## 2023-10-13 ENCOUNTER — Encounter: Payer: Self-pay | Admitting: Internal Medicine

## 2023-10-13 DIAGNOSIS — Z7982 Long term (current) use of aspirin: Secondary | ICD-10-CM | POA: Diagnosis not present

## 2023-10-13 DIAGNOSIS — E039 Hypothyroidism, unspecified: Secondary | ICD-10-CM | POA: Diagnosis not present

## 2023-10-13 DIAGNOSIS — Z556 Problems related to health literacy: Secondary | ICD-10-CM | POA: Diagnosis not present

## 2023-10-13 DIAGNOSIS — Z87891 Personal history of nicotine dependence: Secondary | ICD-10-CM | POA: Diagnosis not present

## 2023-10-13 DIAGNOSIS — E1165 Type 2 diabetes mellitus with hyperglycemia: Secondary | ICD-10-CM | POA: Diagnosis not present

## 2023-10-13 DIAGNOSIS — M609 Myositis, unspecified: Secondary | ICD-10-CM | POA: Diagnosis not present

## 2023-10-13 DIAGNOSIS — M86051 Acute hematogenous osteomyelitis, right femur: Secondary | ICD-10-CM | POA: Diagnosis not present

## 2023-10-13 DIAGNOSIS — K219 Gastro-esophageal reflux disease without esophagitis: Secondary | ICD-10-CM | POA: Diagnosis not present

## 2023-10-13 DIAGNOSIS — I10 Essential (primary) hypertension: Secondary | ICD-10-CM | POA: Diagnosis not present

## 2023-10-13 DIAGNOSIS — M71051 Abscess of bursa, right hip: Secondary | ICD-10-CM | POA: Diagnosis not present

## 2023-10-13 DIAGNOSIS — N179 Acute kidney failure, unspecified: Secondary | ICD-10-CM | POA: Diagnosis not present

## 2023-10-13 DIAGNOSIS — Z791 Long term (current) use of non-steroidal anti-inflammatories (NSAID): Secondary | ICD-10-CM | POA: Diagnosis not present

## 2023-10-13 DIAGNOSIS — E1169 Type 2 diabetes mellitus with other specified complication: Secondary | ICD-10-CM | POA: Diagnosis not present

## 2023-10-13 DIAGNOSIS — R7881 Bacteremia: Secondary | ICD-10-CM | POA: Diagnosis not present

## 2023-10-13 DIAGNOSIS — Z7984 Long term (current) use of oral hypoglycemic drugs: Secondary | ICD-10-CM | POA: Diagnosis not present

## 2023-10-13 DIAGNOSIS — E782 Mixed hyperlipidemia: Secondary | ICD-10-CM | POA: Diagnosis not present

## 2023-10-13 DIAGNOSIS — L89610 Pressure ulcer of right heel, unstageable: Secondary | ICD-10-CM | POA: Diagnosis not present

## 2023-10-13 DIAGNOSIS — M009 Pyogenic arthritis, unspecified: Secondary | ICD-10-CM | POA: Diagnosis not present

## 2023-10-18 DIAGNOSIS — E039 Hypothyroidism, unspecified: Secondary | ICD-10-CM | POA: Diagnosis not present

## 2023-10-18 DIAGNOSIS — M86051 Acute hematogenous osteomyelitis, right femur: Secondary | ICD-10-CM | POA: Diagnosis not present

## 2023-10-18 DIAGNOSIS — M71051 Abscess of bursa, right hip: Secondary | ICD-10-CM | POA: Diagnosis not present

## 2023-10-18 DIAGNOSIS — R7881 Bacteremia: Secondary | ICD-10-CM | POA: Diagnosis not present

## 2023-10-18 DIAGNOSIS — I10 Essential (primary) hypertension: Secondary | ICD-10-CM | POA: Diagnosis not present

## 2023-10-18 DIAGNOSIS — Z7982 Long term (current) use of aspirin: Secondary | ICD-10-CM | POA: Diagnosis not present

## 2023-10-18 DIAGNOSIS — Z7984 Long term (current) use of oral hypoglycemic drugs: Secondary | ICD-10-CM | POA: Diagnosis not present

## 2023-10-18 DIAGNOSIS — E1165 Type 2 diabetes mellitus with hyperglycemia: Secondary | ICD-10-CM | POA: Diagnosis not present

## 2023-10-18 DIAGNOSIS — L89610 Pressure ulcer of right heel, unstageable: Secondary | ICD-10-CM | POA: Diagnosis not present

## 2023-10-18 DIAGNOSIS — N179 Acute kidney failure, unspecified: Secondary | ICD-10-CM | POA: Diagnosis not present

## 2023-10-18 DIAGNOSIS — K219 Gastro-esophageal reflux disease without esophagitis: Secondary | ICD-10-CM | POA: Diagnosis not present

## 2023-10-18 DIAGNOSIS — M609 Myositis, unspecified: Secondary | ICD-10-CM | POA: Diagnosis not present

## 2023-10-18 DIAGNOSIS — Z791 Long term (current) use of non-steroidal anti-inflammatories (NSAID): Secondary | ICD-10-CM | POA: Diagnosis not present

## 2023-10-18 DIAGNOSIS — E1169 Type 2 diabetes mellitus with other specified complication: Secondary | ICD-10-CM | POA: Diagnosis not present

## 2023-10-18 DIAGNOSIS — Z556 Problems related to health literacy: Secondary | ICD-10-CM | POA: Diagnosis not present

## 2023-10-18 DIAGNOSIS — Z87891 Personal history of nicotine dependence: Secondary | ICD-10-CM | POA: Diagnosis not present

## 2023-10-18 DIAGNOSIS — E782 Mixed hyperlipidemia: Secondary | ICD-10-CM | POA: Diagnosis not present

## 2023-10-18 DIAGNOSIS — M009 Pyogenic arthritis, unspecified: Secondary | ICD-10-CM | POA: Diagnosis not present

## 2023-10-25 DIAGNOSIS — I10 Essential (primary) hypertension: Secondary | ICD-10-CM | POA: Diagnosis not present

## 2023-10-25 DIAGNOSIS — M009 Pyogenic arthritis, unspecified: Secondary | ICD-10-CM | POA: Diagnosis not present

## 2023-10-25 DIAGNOSIS — K219 Gastro-esophageal reflux disease without esophagitis: Secondary | ICD-10-CM | POA: Diagnosis not present

## 2023-10-25 DIAGNOSIS — Z791 Long term (current) use of non-steroidal anti-inflammatories (NSAID): Secondary | ICD-10-CM | POA: Diagnosis not present

## 2023-10-25 DIAGNOSIS — Z87891 Personal history of nicotine dependence: Secondary | ICD-10-CM | POA: Diagnosis not present

## 2023-10-25 DIAGNOSIS — M609 Myositis, unspecified: Secondary | ICD-10-CM | POA: Diagnosis not present

## 2023-10-25 DIAGNOSIS — Z556 Problems related to health literacy: Secondary | ICD-10-CM | POA: Diagnosis not present

## 2023-10-25 DIAGNOSIS — E782 Mixed hyperlipidemia: Secondary | ICD-10-CM | POA: Diagnosis not present

## 2023-10-25 DIAGNOSIS — E1165 Type 2 diabetes mellitus with hyperglycemia: Secondary | ICD-10-CM | POA: Diagnosis not present

## 2023-10-25 DIAGNOSIS — R7881 Bacteremia: Secondary | ICD-10-CM | POA: Diagnosis not present

## 2023-10-25 DIAGNOSIS — L89610 Pressure ulcer of right heel, unstageable: Secondary | ICD-10-CM | POA: Diagnosis not present

## 2023-10-25 DIAGNOSIS — N179 Acute kidney failure, unspecified: Secondary | ICD-10-CM | POA: Diagnosis not present

## 2023-10-25 DIAGNOSIS — M71051 Abscess of bursa, right hip: Secondary | ICD-10-CM | POA: Diagnosis not present

## 2023-10-25 DIAGNOSIS — M86051 Acute hematogenous osteomyelitis, right femur: Secondary | ICD-10-CM | POA: Diagnosis not present

## 2023-10-25 DIAGNOSIS — E1169 Type 2 diabetes mellitus with other specified complication: Secondary | ICD-10-CM | POA: Diagnosis not present

## 2023-10-25 DIAGNOSIS — E039 Hypothyroidism, unspecified: Secondary | ICD-10-CM | POA: Diagnosis not present

## 2023-10-25 DIAGNOSIS — Z7984 Long term (current) use of oral hypoglycemic drugs: Secondary | ICD-10-CM | POA: Diagnosis not present

## 2023-10-25 DIAGNOSIS — Z7982 Long term (current) use of aspirin: Secondary | ICD-10-CM | POA: Diagnosis not present

## 2023-11-03 NOTE — Telephone Encounter (Signed)
 Called pt to schedule TCS/EGD, no answer. LVM

## 2023-11-07 DIAGNOSIS — I10 Essential (primary) hypertension: Secondary | ICD-10-CM | POA: Diagnosis not present

## 2023-11-07 DIAGNOSIS — Z556 Problems related to health literacy: Secondary | ICD-10-CM | POA: Diagnosis not present

## 2023-11-07 DIAGNOSIS — E782 Mixed hyperlipidemia: Secondary | ICD-10-CM | POA: Diagnosis not present

## 2023-11-07 DIAGNOSIS — L89610 Pressure ulcer of right heel, unstageable: Secondary | ICD-10-CM | POA: Diagnosis not present

## 2023-11-07 DIAGNOSIS — Z7984 Long term (current) use of oral hypoglycemic drugs: Secondary | ICD-10-CM | POA: Diagnosis not present

## 2023-11-07 DIAGNOSIS — M009 Pyogenic arthritis, unspecified: Secondary | ICD-10-CM | POA: Diagnosis not present

## 2023-11-07 DIAGNOSIS — Z791 Long term (current) use of non-steroidal anti-inflammatories (NSAID): Secondary | ICD-10-CM | POA: Diagnosis not present

## 2023-11-07 DIAGNOSIS — Z7982 Long term (current) use of aspirin: Secondary | ICD-10-CM | POA: Diagnosis not present

## 2023-11-07 DIAGNOSIS — M609 Myositis, unspecified: Secondary | ICD-10-CM | POA: Diagnosis not present

## 2023-11-07 DIAGNOSIS — Z87891 Personal history of nicotine dependence: Secondary | ICD-10-CM | POA: Diagnosis not present

## 2023-11-07 DIAGNOSIS — K219 Gastro-esophageal reflux disease without esophagitis: Secondary | ICD-10-CM | POA: Diagnosis not present

## 2023-11-07 DIAGNOSIS — E039 Hypothyroidism, unspecified: Secondary | ICD-10-CM | POA: Diagnosis not present

## 2023-11-07 DIAGNOSIS — E119 Type 2 diabetes mellitus without complications: Secondary | ICD-10-CM | POA: Diagnosis not present

## 2023-11-14 ENCOUNTER — Ambulatory Visit: Admitting: Internal Medicine

## 2023-11-14 DIAGNOSIS — Z7984 Long term (current) use of oral hypoglycemic drugs: Secondary | ICD-10-CM | POA: Diagnosis not present

## 2023-11-14 DIAGNOSIS — I10 Essential (primary) hypertension: Secondary | ICD-10-CM | POA: Diagnosis not present

## 2023-11-14 DIAGNOSIS — Z7982 Long term (current) use of aspirin: Secondary | ICD-10-CM | POA: Diagnosis not present

## 2023-11-14 DIAGNOSIS — Z791 Long term (current) use of non-steroidal anti-inflammatories (NSAID): Secondary | ICD-10-CM | POA: Diagnosis not present

## 2023-11-14 DIAGNOSIS — E039 Hypothyroidism, unspecified: Secondary | ICD-10-CM | POA: Diagnosis not present

## 2023-11-14 DIAGNOSIS — M609 Myositis, unspecified: Secondary | ICD-10-CM | POA: Diagnosis not present

## 2023-11-14 DIAGNOSIS — E119 Type 2 diabetes mellitus without complications: Secondary | ICD-10-CM | POA: Diagnosis not present

## 2023-11-14 DIAGNOSIS — E782 Mixed hyperlipidemia: Secondary | ICD-10-CM | POA: Diagnosis not present

## 2023-11-14 DIAGNOSIS — K219 Gastro-esophageal reflux disease without esophagitis: Secondary | ICD-10-CM | POA: Diagnosis not present

## 2023-11-14 DIAGNOSIS — Z556 Problems related to health literacy: Secondary | ICD-10-CM | POA: Diagnosis not present

## 2023-11-14 DIAGNOSIS — M009 Pyogenic arthritis, unspecified: Secondary | ICD-10-CM | POA: Diagnosis not present

## 2023-11-14 DIAGNOSIS — L89610 Pressure ulcer of right heel, unstageable: Secondary | ICD-10-CM | POA: Diagnosis not present

## 2023-11-14 DIAGNOSIS — Z87891 Personal history of nicotine dependence: Secondary | ICD-10-CM | POA: Diagnosis not present

## 2023-11-15 ENCOUNTER — Ambulatory Visit

## 2023-11-15 DIAGNOSIS — R339 Retention of urine, unspecified: Secondary | ICD-10-CM | POA: Diagnosis not present

## 2023-11-15 NOTE — Progress Notes (Signed)
 Suprapubic Cath Change  Patient is present today for a suprapubic catheter change due to urinary retention.  10 ml of water was drained from the balloon, a 16 FR foley cath was removed from the tract with out difficulty.  Suprapubic catheter site was cleaned and prepped in a sterile fashion with Betadinex3  A 16 FR foley cath was replaced into the tract no complications were noted. Urine return was noted, urine Clear yellow in color . 10 ml of sterile water was inflated into the balloon and a night bag was attached for drainage.  Patient tolerated well. A night bag was given to patient and proper instruction was given on how to switch bags.    Performed by: Carlos, CMA  Follow up: Keep monthly SP Tube change

## 2023-11-21 ENCOUNTER — Ambulatory Visit (INDEPENDENT_AMBULATORY_CARE_PROVIDER_SITE_OTHER): Admitting: Internal Medicine

## 2023-11-21 ENCOUNTER — Other Ambulatory Visit: Payer: Self-pay

## 2023-11-21 ENCOUNTER — Encounter: Payer: Self-pay | Admitting: Internal Medicine

## 2023-11-21 VITALS — BP 108/72 | HR 103 | Temp 97.4°F

## 2023-11-21 DIAGNOSIS — B9562 Methicillin resistant Staphylococcus aureus infection as the cause of diseases classified elsewhere: Secondary | ICD-10-CM | POA: Diagnosis not present

## 2023-11-21 DIAGNOSIS — R7881 Bacteremia: Secondary | ICD-10-CM

## 2023-11-21 NOTE — Progress Notes (Signed)
 Patient Active Problem List   Diagnosis Date Noted   Bacteremia 08/26/2023   Type 2 diabetes mellitus with hyperglycemia (HCC) 08/24/2023   Acquired hypothyroidism 08/24/2023   GERD (gastroesophageal reflux disease) 08/24/2023   Mixed hyperlipidemia 08/24/2023   Acute osteomyelitis involving pelvic region and thigh (HCC) 08/24/2023   Septic arthritis of hip (HCC) 08/24/2023   Myositis 08/24/2023   Vitamin B12 deficiency 08/23/2023   Iron deficiency anemia due to chronic blood loss 08/23/2023   Weakness 07/15/2023   MRSA bacteremia 07/15/2023   Malnutrition of moderate degree 07/12/2023   Failure to thrive in adult 07/09/2023   Metabolic acidosis 07/09/2023   Protein deficiency 07/09/2023   Normocytic anemia 07/09/2023   Lactic acidosis 07/09/2023   UTI (urinary tract infection) 06/23/2023   DM (diabetes mellitus) (HCC) 06/23/2023   Dementia without behavioral disturbance (HCC) 11/05/2021   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   BENZTROPINE  (COGENTIN ) 2 MG TABLET    Take 1 tablet (2 mg total) by mouth at bedtime.   CHLORHEXIDINE  (HIBICLENS ) 4 % EXTERNAL LIQUID    Apply 15 mLs (1 Application total) topically as directed for 30 doses. Use as directed daily for 5 days every other week for 6 weeks.   DAPTOMYCIN  (CUBICIN ) 500 MG INJECTION    Inject 500 mg into the vein.   DONEPEZIL  (ARICEPT ) 5 MG TABLET    Take 5 mg by mouth daily.   FERROUS SULFATE  324 (65 FE) MG TBEC    Take 1 tablet (325 mg total) by mouth daily.   HYDROCODONE -ACETAMINOPHEN  (NORCO/VICODIN) 5-325 MG TABLET     Take 1 tablet by mouth every 6 (six) hours as needed.   LEVOTHYROXINE  (SYNTHROID , LEVOTHROID) 75 MCG TABLET    Take 1 tablet (75 mcg total) by mouth daily.   MELOXICAM (MOBIC) 15 MG TABLET    Take 15 mg by mouth daily.   METFORMIN (GLUCOPHAGE-XR) 500 MG 24 HR TABLET    Take 500 mg by mouth daily.   MUPIROCIN  OINTMENT (BACTROBAN ) 2 %    Apply 1 Application topically daily.   OMEPRAZOLE  (PRILOSEC) 20 MG CAPSULE    Take 1 capsule (20 mg total) by mouth daily.   OXYBUTYNIN  (DITROPAN ) 5 MG TABLET    Take 1 tablet (5 mg total) by mouth 3 (three) times daily.   PALIPERIDONE  (INVEGA ) 3 MG 24 HR TABLET    Take 1 tablet by mouth 2 (two) times daily.   POLYETHYLENE GLYCOL (MIRALAX  / GLYCOLAX ) 17 G PACKET    Take 17 g by mouth 2 (two) times daily.   QUETIAPINE  (SEROQUEL ) 200 MG TABLET    Take 600 mg by mouth at bedtime.   ROSUVASTATIN  (CRESTOR ) 5 MG TABLET    Take 1 tablet (5 mg total) by mouth once a week. Take 5 mg by mouth every week on Friday (please hold medication until August 16, 2023 to minimize risk of rhabdomyolysis while using daptomycin ).   VITAMIN B-12 (CYANOCOBALAMIN ) 100 MCG TABLET    Take 1 tablet (100 mcg total) by mouth daily.  Modified Medications  No medications on file  Discontinued Medications   No medications on file    Subjective: 68 y.o. M with Hx of dementia, schizophrenia, suprapubic catheter , MRSA bacteremia treated with dapt x 4 weeks presents for MRSA bacteremia and right septic hip concern for osteomyelitis.  At last ID visit on 6/30 patient had noted to have osteomyelitis on an MRI done by Dr. Orpha along with a chronic wound tertiary care facility.  MRI on 6/26 showed possible septic arthritis/OM , severe destruction to right proximal femur and right acetabulum, mysositis, right hipfluid collection, moderate large joint effusion, possible phlegmon blood cultures obtained at ID clinic grew MRSA as such she was sent back to the ED.  He did not go to tertiary care, went to  [facility].  He underwent I&D and arthrotomy of right hip.  PICC line placed and discharged on daptomycin  x 6 weeks EOT 10/04/2023.  7/3 OR cultures grew MRSA as well.  09/21/2023: Tolerating abx, no missed doses. Pt presents with nephew who provides history.   Today; stopped doxy 3 weeks ago. Denies fevers and chills Review of Systems: Review of Systems  All other systems reviewed and are negative.   Past Medical History:  Diagnosis Date   Dementia (HCC)    Schizophrenia (HCC)     Social History   Tobacco Use   Smoking status: Former    Current packs/day: 1.00    Types: Cigarettes  Vaping Use   Vaping status: Never Used  Substance Use Topics   Alcohol  use: Yes    Comment: occasionaly   Drug use: No    No family history on file.  No Known Allergies  Health Maintenance  Topic Date Due   COVID-19 Vaccine (1) Never done   FOOT EXAM  Never done   Diabetic kidney evaluation - Urine ACR  Never done   Hepatitis C Screening  Never done   DTaP/Tdap/Td (1 - Tdap) Never done   Zoster Vaccines- Shingrix (1 of 2) Never done   Colonoscopy  Never done   Pneumococcal Vaccine: 50+ Years (2 of 2 - PCV) 12/12/2013   OPHTHALMOLOGY EXAM  01/19/2023   Medicare Annual Wellness (AWV)  07/29/2023   Influenza Vaccine  09/23/2023   HEMOGLOBIN A1C  12/24/2023   Diabetic kidney evaluation - eGFR measurement  09/17/2024   HPV VACCINES  Aged Out   Meningococcal B Vaccine  Aged Out    Objective:  Vitals:   11/21/23 1421  BP: 108/72  Pulse: (!) 103  Temp: (!) 97.4 F (36.3 C)  TempSrc: Oral  SpO2: 100%   There is no height or weight on file to calculate BMI.  Physical Exam Constitutional:      General: He is not in acute distress.    Appearance: He is normal weight. He is not toxic-appearing.  HENT:     Head: Normocephalic and atraumatic.     Right Ear: External ear normal.     Left Ear: External ear normal.     Nose: No congestion or rhinorrhea.     Mouth/Throat:     Mouth:  Mucous membranes are moist.     Pharynx: Oropharynx is clear.  Eyes:     Extraocular Movements: Extraocular movements intact.     Conjunctiva/sclera: Conjunctivae normal.     Pupils: Pupils are equal, round, and reactive to light.  Cardiovascular:     Rate and Rhythm: Normal rate and regular rhythm.     Heart sounds: No murmur heard.  No friction rub. No gallop.  Pulmonary:     Effort: Pulmonary effort is normal.     Breath sounds: Normal breath sounds.  Abdominal:     General: Abdomen is flat. Bowel sounds are normal.     Palpations: Abdomen is soft.  Musculoskeletal:        General: No swelling. Normal range of motion.     Cervical back: Normal range of motion and neck supple.  Skin:    General: Skin is warm and dry.  Neurological:     General: No focal deficit present.     Mental Status: He is oriented to person, place, and time.  Psychiatric:        Mood and Affect: Mood normal.    Lab Results Lab Results  Component Value Date   WBC 7.4 09/18/2023   HGB 7.3 (L) 09/18/2023   HCT 24.5 (L) 09/18/2023   MCV 90.4 09/18/2023   PLT 192 09/18/2023    Lab Results  Component Value Date   CREATININE 1.54 (H) 09/18/2023   BUN 19 09/18/2023   NA 134 (L) 09/18/2023   K 4.8 09/18/2023   CL 104 09/18/2023   CO2 23 09/18/2023    Lab Results  Component Value Date   ALT 12 09/18/2023   AST 15 09/18/2023   ALKPHOS 58 09/18/2023   BILITOT 0.4 09/18/2023    No results found for: CHOL, HDL, LDLCALC, LDLDIRECT, TRIG, CHOLHDL No results found for: LABRPR, RPRTITER No results found for: HIV1RNAQUANT, HIV1RNAVL, CD4TABS   Problem List Items Addressed This Visit   None  Results   Assessment/Plan 68 year old male with history of dementia presents for follow-up of #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  He presented with an MRI showing possible septic arthritis/OM , severe destruction to right proximal femur and right acetabulum, mysositis, right hipfluid collection, moderate large joint effusion, possible phlegmon.  At that point he had completed 6 weeks of antibiotics 2/18.  She had elevated WBC on 6/18 as well.  We got blood cultures which grew MRSA sent back to the ED. -Reviewed OR note from 7/3 he underwent I&D of right hip septic arthritis with destructive lesion of the acetabulum femoral head IntraOp findings showed phlegmonous material without gross purulence - During hospitalization he underwent I&D arthrotomy with culture growing MRSA as well. - Discharged on daptomycin  x 6 weeks EOT 10/04/2023. -Seen by Dr Octavio in the interim, butterfly stiches placed. Sees Dr hendricks again on 8/4. - No hardware in place but given recurrent bacteremia and unclear if  source control achieved, elevated esr/crp will transition to doxy x 1 month. Stopped doxy 3 weeks ago Plan Labs and blood cx today. Nephew requested call back about labs.  F/u with ID prn    Loney Stank, MD [facility] for Infectious Disease [facility] 11/21/2023, 2:28 PM   I have personally spent 42 minutes involved in face-to-face and non-face-to-face activities for this patient on the day of the visit. Professional time spent includes the following activities: Preparing to see the patient (review of tests), Obtaining and/or reviewing separately obtained history (admission/discharge record), Performing a medically appropriate examination and/or evaluation , Ordering medications/tests/procedures, referring and communicating with other health care professionals, Documenting clinical information in the EMR, Independently interpreting results (not separately reported), Communicating results to the patient/family/caregiver, Counseling  and educating the patient/family/caregiver and Care coordination  (not separately reported).

## 2023-11-22 DIAGNOSIS — M609 Myositis, unspecified: Secondary | ICD-10-CM | POA: Diagnosis not present

## 2023-11-22 DIAGNOSIS — Z791 Long term (current) use of non-steroidal anti-inflammatories (NSAID): Secondary | ICD-10-CM | POA: Diagnosis not present

## 2023-11-22 DIAGNOSIS — E782 Mixed hyperlipidemia: Secondary | ICD-10-CM | POA: Diagnosis not present

## 2023-11-22 DIAGNOSIS — E039 Hypothyroidism, unspecified: Secondary | ICD-10-CM | POA: Diagnosis not present

## 2023-11-22 DIAGNOSIS — I10 Essential (primary) hypertension: Secondary | ICD-10-CM | POA: Diagnosis not present

## 2023-11-22 DIAGNOSIS — K219 Gastro-esophageal reflux disease without esophagitis: Secondary | ICD-10-CM | POA: Diagnosis not present

## 2023-11-22 DIAGNOSIS — M009 Pyogenic arthritis, unspecified: Secondary | ICD-10-CM | POA: Diagnosis not present

## 2023-11-22 DIAGNOSIS — Z556 Problems related to health literacy: Secondary | ICD-10-CM | POA: Diagnosis not present

## 2023-11-22 DIAGNOSIS — Z7982 Long term (current) use of aspirin: Secondary | ICD-10-CM | POA: Diagnosis not present

## 2023-11-22 DIAGNOSIS — Z87891 Personal history of nicotine dependence: Secondary | ICD-10-CM | POA: Diagnosis not present

## 2023-11-22 DIAGNOSIS — E119 Type 2 diabetes mellitus without complications: Secondary | ICD-10-CM | POA: Diagnosis not present

## 2023-11-22 DIAGNOSIS — Z7984 Long term (current) use of oral hypoglycemic drugs: Secondary | ICD-10-CM | POA: Diagnosis not present

## 2023-11-22 DIAGNOSIS — L89610 Pressure ulcer of right heel, unstageable: Secondary | ICD-10-CM | POA: Diagnosis not present

## 2023-11-27 LAB — CULTURE, BLOOD (SINGLE)
MICRO NUMBER:: 17032294
MICRO NUMBER:: 17032295
Result:: NO GROWTH
Result:: NO GROWTH
SPECIMEN QUALITY:: ADEQUATE
SPECIMEN QUALITY:: ADEQUATE

## 2023-11-27 LAB — SEDIMENTATION RATE: Sed Rate: >130 mm/h — ABNORMAL HIGH (ref 0–20)

## 2023-11-27 LAB — C-REACTIVE PROTEIN: CRP: 97.7 mg/L — ABNORMAL HIGH (ref <8.0)

## 2023-11-28 DIAGNOSIS — E782 Mixed hyperlipidemia: Secondary | ICD-10-CM | POA: Diagnosis not present

## 2023-11-28 DIAGNOSIS — L89610 Pressure ulcer of right heel, unstageable: Secondary | ICD-10-CM | POA: Diagnosis not present

## 2023-11-28 DIAGNOSIS — E119 Type 2 diabetes mellitus without complications: Secondary | ICD-10-CM | POA: Diagnosis not present

## 2023-11-28 DIAGNOSIS — K219 Gastro-esophageal reflux disease without esophagitis: Secondary | ICD-10-CM | POA: Diagnosis not present

## 2023-11-28 DIAGNOSIS — M609 Myositis, unspecified: Secondary | ICD-10-CM | POA: Diagnosis not present

## 2023-11-28 DIAGNOSIS — Z7982 Long term (current) use of aspirin: Secondary | ICD-10-CM | POA: Diagnosis not present

## 2023-11-28 DIAGNOSIS — E039 Hypothyroidism, unspecified: Secondary | ICD-10-CM | POA: Diagnosis not present

## 2023-11-28 DIAGNOSIS — Z791 Long term (current) use of non-steroidal anti-inflammatories (NSAID): Secondary | ICD-10-CM | POA: Diagnosis not present

## 2023-11-28 DIAGNOSIS — Z556 Problems related to health literacy: Secondary | ICD-10-CM | POA: Diagnosis not present

## 2023-11-28 DIAGNOSIS — M009 Pyogenic arthritis, unspecified: Secondary | ICD-10-CM | POA: Diagnosis not present

## 2023-11-28 DIAGNOSIS — I10 Essential (primary) hypertension: Secondary | ICD-10-CM | POA: Diagnosis not present

## 2023-11-28 DIAGNOSIS — Z7984 Long term (current) use of oral hypoglycemic drugs: Secondary | ICD-10-CM | POA: Diagnosis not present

## 2023-11-28 DIAGNOSIS — Z87891 Personal history of nicotine dependence: Secondary | ICD-10-CM | POA: Diagnosis not present

## 2023-12-01 ENCOUNTER — Encounter (INDEPENDENT_AMBULATORY_CARE_PROVIDER_SITE_OTHER): Payer: Self-pay | Admitting: *Deleted

## 2023-12-01 ENCOUNTER — Other Ambulatory Visit (INDEPENDENT_AMBULATORY_CARE_PROVIDER_SITE_OTHER): Payer: Self-pay | Admitting: *Deleted

## 2023-12-01 MED ORDER — PEG 3350-KCL-NA BICARB-NACL 420 G PO SOLR: 4000.0000 mL | Freq: Once | ORAL | 0 refills | Status: AC

## 2023-12-01 NOTE — Telephone Encounter (Signed)
 Pt has been scheduled for 12/27/23. Instructions mailed and prep sent to pharmacy

## 2023-12-07 DIAGNOSIS — E1165 Type 2 diabetes mellitus with hyperglycemia: Secondary | ICD-10-CM | POA: Diagnosis not present

## 2023-12-07 DIAGNOSIS — E039 Hypothyroidism, unspecified: Secondary | ICD-10-CM | POA: Diagnosis not present

## 2023-12-07 DIAGNOSIS — Z299 Encounter for prophylactic measures, unspecified: Secondary | ICD-10-CM | POA: Diagnosis not present

## 2023-12-07 DIAGNOSIS — Z23 Encounter for immunization: Secondary | ICD-10-CM | POA: Diagnosis not present

## 2023-12-07 DIAGNOSIS — Z0000 Encounter for general adult medical examination without abnormal findings: Secondary | ICD-10-CM | POA: Diagnosis not present

## 2023-12-07 DIAGNOSIS — L02415 Cutaneous abscess of right lower limb: Secondary | ICD-10-CM | POA: Diagnosis not present

## 2023-12-07 DIAGNOSIS — I10 Essential (primary) hypertension: Secondary | ICD-10-CM | POA: Diagnosis not present

## 2023-12-19 ENCOUNTER — Emergency Department (HOSPITAL_COMMUNITY)
Admission: EM | Admit: 2023-12-19 | Discharge: 2023-12-19 | Disposition: A | Discharge status: Home / Self Care | Attending: Emergency Medicine | Admitting: Emergency Medicine

## 2023-12-19 ENCOUNTER — Other Ambulatory Visit: Payer: Self-pay

## 2023-12-19 ENCOUNTER — Encounter (HOSPITAL_COMMUNITY): Payer: Self-pay

## 2023-12-19 DIAGNOSIS — X58XXXD Exposure to other specified factors, subsequent encounter: Secondary | ICD-10-CM | POA: Insufficient documentation

## 2023-12-19 DIAGNOSIS — S71001D Unspecified open wound, right hip, subsequent encounter: Secondary | ICD-10-CM | POA: Diagnosis not present

## 2023-12-19 DIAGNOSIS — L89219 Pressure ulcer of right hip, unspecified stage: Secondary | ICD-10-CM | POA: Diagnosis not present

## 2023-12-19 DIAGNOSIS — Z7982 Long term (current) use of aspirin: Secondary | ICD-10-CM | POA: Diagnosis not present

## 2023-12-19 DIAGNOSIS — T148XXA Other injury of unspecified body region, initial encounter: Secondary | ICD-10-CM

## 2023-12-19 MED ORDER — DOXYCYCLINE HYCLATE 100 MG PO CAPS: 100.0000 mg | ORAL_CAPSULE | Freq: Two times a day (BID) | ORAL | 0 refills | Status: DC

## 2023-12-19 MED ORDER — DOXYCYCLINE HYCLATE 100 MG PO TABS
100.0000 mg | ORAL_TABLET | Freq: Once | ORAL | Status: AC
  Administered 2023-12-19: 100 mg via ORAL
  Filled 2023-12-19: qty 1

## 2023-12-19 NOTE — ED Triage Notes (Signed)
 Pt arrived via POV from home c/o a draining wound on his right hip. Pt reports recent surgery at this site as well and reports finishing PO antibiotics 2 days ago.

## 2023-12-19 NOTE — ED Provider Notes (Signed)
 [place] EMERGENCY DEPARTMENT AT [facility] Provider Note   CSN: [id number] Arrival date & time: 12/19/23  1100     Patient presents with: Wound Check   [name] is a 68 y.o. male.   Pt with c/o chronic open wound to right hip - had developed chronic infection to area, mrsa, s/p I and D/arthrotomy of hip 7/25. Has been on several course of antibiotic therapy. Family brought to ED b/c they were questioning whether needed to be placed back on antibiotic therapy, indicate they have been squeezing a small amount of purulent drainage from the chronically open wound. Deny any acute change in the wound, whether appearance or size. Are changing dressings per normal routine. No fever, chills or sweats. No new or worsening pain, swelling or redness to area.  No nausea/vomiting. Normal appetite. Systemically does not feel acutely ill or sick.  The history is provided by the patient, medical records and a relative. The history is limited by the condition of the patient.  Wound Check Pertinent negatives include no chest pain, no abdominal pain and no shortness of breath.       Prior to Admission medications   Medication Sig Start Date End Date Taking? Authorizing Provider  doxycycline  (VIBRAMYCIN ) 100 MG capsule Take 1 capsule (100 mg total) by mouth 2 (two) times daily. 12/19/23  Yes Bernard Drivers, MD  aspirin  81 MG chewable tablet Chew 81 mg by mouth daily.    [provider]  benztropine  (COGENTIN ) 2 MG tablet Take 1 tablet (2 mg total) by mouth at bedtime. 09/28/12   [name] LABOR, NP  chlorhexidine  (HIBICLENS ) 4 % external liquid Apply 15 mLs (1 Application total) topically as directed for 30 doses. Use as directed daily for 5 days every other week for 6 weeks. 08/25/23   Danton Lauraine LABOR, PA-C  DAPTOmycin  (CUBICIN ) 500 MG injection Inject 500 mg into the vein. 09/20/23   [provider]  donepezil  (ARICEPT ) 5 MG tablet Take 5 mg by mouth daily. 04/17/21    [provider]  ferrous sulfate  324 (65 Fe) MG TBEC Take 1 tablet (325 mg total) by mouth daily. 09/28/23   Kennedy Charmaine CROME, NP  HYDROcodone -acetaminophen  (NORCO/VICODIN) 5-325 MG tablet Take 1 tablet by mouth every 6 (six) hours as needed. 09/04/23   [provider]  levothyroxine  (SYNTHROID , LEVOTHROID) 75 MCG tablet Take 1 tablet (75 mcg total) by mouth daily. 09/28/12   [name] LABOR, NP  meloxicam (MOBIC) 15 MG tablet Take 15 mg by mouth daily. 08/23/23   [provider]  metFORMIN (GLUCOPHAGE-XR) 500 MG 24 hr tablet Take 500 mg by mouth daily. 03/13/21   [provider]  mupirocin  ointment (BACTROBAN ) 2 % Apply 1 Application topically daily. 08/25/23   [provider]  omeprazole  (PRILOSEC) 20 MG capsule Take 1 capsule (20 mg total) by mouth daily. 09/28/12   [name] LABOR, NP  oxybutynin  (DITROPAN ) 5 MG tablet Take 1 tablet (5 mg total) by mouth 3 (three) times daily. 07/15/23   Ricky Fines, MD  paliperidone  (INVEGA ) 3 MG 24 hr tablet Take 1 tablet by mouth 2 (two) times daily. 03/13/21   [provider]  polyethylene glycol (MIRALAX  / GLYCOLAX ) 17 g packet Take 17 g by mouth 2 (two) times daily. 03/20/21   [name] LABOR, MD  QUEtiapine  (SEROQUEL ) 200 MG tablet Take 600 mg by mouth at bedtime. 09/20/23   [provider]  rosuvastatin  (CRESTOR ) 5 MG tablet Take 1  tablet (5 mg total) by mouth once a week. Take 5 mg by mouth every week on Friday (please hold medication until August 16, 2023 to minimize risk of rhabdomyolysis while using daptomycin ). 08/13/23   Ricky Fines, MD  vitamin B-12 (CYANOCOBALAMIN ) 100 MCG tablet Take 1 tablet (100 mcg total) by mouth daily. 09/28/23   Kennedy Charmaine CROME, NP    Allergies: Patient has no known allergies.    Review of Systems  Constitutional:  Negative for chills, diaphoresis and fever.  Respiratory:  Negative for shortness of breath.   Cardiovascular:  Negative for chest pain.  Gastrointestinal:   Negative for abdominal pain, nausea and vomiting.  Musculoskeletal:        Chronic open wound right hip  Skin:  Negative for rash.  Neurological:  Negative for weakness and numbness.    Updated Vital Signs BP 118/68 (BP Location: Right Arm)   Pulse 92   Temp 97.8 F (36.6 C) (Oral)   Resp 16   Ht 1.778 m (5' 10)   Wt 90.7 kg   SpO2 100%   BMI 28.69 kg/m   Physical Exam Vitals and nursing note reviewed.  Constitutional:      Appearance: Normal appearance. He is well-developed.  HENT:     Head: Atraumatic.     Nose: Nose normal.     Mouth/Throat:     Mouth: Mucous membranes are moist.     Pharynx: Oropharynx is clear.  Eyes:     General: No scleral icterus.    Conjunctiva/sclera: Conjunctivae normal.  Neck:     Trachea: No tracheal deviation.  Cardiovascular:     Rate and Rhythm: Normal rate and regular rhythm.     Pulses: Normal pulses.     Heart sounds: Normal heart sounds. No murmur heard.    No friction rub. No gallop.  Pulmonary:     Effort: Pulmonary effort is normal. No accessory muscle usage or respiratory distress.     Breath sounds: Normal breath sounds.  Abdominal:     General: Bowel sounds are normal. There is no distension.     Palpations: Abdomen is soft.     Tenderness: There is no abdominal tenderness.  Musculoskeletal:     Cervical back: Normal range of motion and neck supple. No rigidity.     Comments: Chronically open wound to right hip - see photo. No necrotic or devitalized tissue. Currently no purulent drainage or other drainage able to be expressed from wound. Good passive rom hip and knee without pain. No significant swelling to thigh or hip area. Distal pulses palp.   Skin:    General: Skin is warm and dry.     Findings: No rash.  Neurological:     Mental Status: He is alert.     Comments: Alert, speech clear.   Psychiatric:        Mood and Affect: Mood normal.     (all labs ordered are listed, but only abnormal results are  displayed) Labs Reviewed - No data to display  EKG: None  Radiology: No results found.   Procedures   Medications Ordered in the ED  doxycycline  (VIBRA -TABS) tablet 100 mg (has no administration in time range)                                    Medical Decision Making Problems Addressed: Chronic wound: chronic illness or injury that poses a threat  to life or bodily functions Open wound of right hip, subsequent encounter: chronic illness or injury  Amount and/or Complexity of Data Reviewed Independent Historian:     Details: family Labs:  Decision-making details documented in ED Course.  Risk Prescription drug management.   Reviewed nursing notes and prior charts for additional history.   Recent prior labs reviewed/interpreted by me - most recent blood cultures 9/29 are negative. Pts inflamm markers appear chronically elevated.   Given report of recent purulent d/c, will place on doxycycline .   Pt systemically/generally does not appear sick or ill.   Rec close f/u with his pcp, ID doc, and ortho.   Return precautions provided.      Final diagnoses:  Chronic wound  Open wound of right hip, subsequent encounter    ED Discharge Orders          Ordered    doxycycline  (VIBRAMYCIN ) 100 MG capsule  2 times daily        12/19/23 1202               Bernard Drivers, MD 12/19/23 1203

## 2023-12-19 NOTE — Discharge Instructions (Signed)
 It was our pleasure to provide your ER care today - we hope that you feel better.  Continue to keep the area very clean, change dressings 1-2x/day.   Take antibiotic as prescribed.   Follow up with your primary care doctor, ID doctor and orthopedist in the next 1-2 weeks.  Return to ER if worse, new symptoms, fevers, increased swelling/spreading redness, severe pain, or other concern.

## 2023-12-20 ENCOUNTER — Ambulatory Visit

## 2023-12-20 DIAGNOSIS — R339 Retention of urine, unspecified: Secondary | ICD-10-CM | POA: Diagnosis not present

## 2023-12-20 MED ORDER — CIPROFLOXACIN HCL 500 MG PO TABS
500.0000 mg | ORAL_TABLET | Freq: Once | ORAL | Status: AC
  Administered 2023-12-20: 500 mg via ORAL

## 2023-12-20 NOTE — Progress Notes (Addendum)
 Suprapubic Cath Change  Patient is present today for a suprapubic catheter change due to urinary retention.  Urinary Retention 10 ml of water was drained from the balloon, a 16 FR foley cath was removed from the tract with out difficulty.  Suprapubic catheter site was cleaned and prepped in a sterile fashion with Betadinex3  A 16 FR foley cath was replaced into the tract no complications were noted. Urine return was noted, urine Clear yellow in color . 10 ml of sterile water was inflated into the balloon and a night bag was attached for drainage.  Patient tolerated well. A night bag was given to patient and proper instruction was given on how to switch bags.    Performed by: Carlos, CMA  Follow up: 4 weeks SP Tube Cath Change

## 2023-12-22 ENCOUNTER — Encounter (HOSPITAL_COMMUNITY)
Admission: RE | Admit: 2023-12-22 | Discharge: 2023-12-22 | Disposition: A | Discharge status: Home / Self Care | Source: Ambulatory Visit | Attending: Gastroenterology | Admitting: Gastroenterology

## 2023-12-27 ENCOUNTER — Encounter (HOSPITAL_COMMUNITY): Payer: Self-pay | Admitting: Gastroenterology

## 2023-12-27 ENCOUNTER — Other Ambulatory Visit: Payer: Self-pay

## 2023-12-27 ENCOUNTER — Telehealth: Payer: Self-pay | Admitting: *Deleted

## 2023-12-27 ENCOUNTER — Ambulatory Visit (HOSPITAL_COMMUNITY)
Admission: RE | Admit: 2023-12-27 | Discharge: 2023-12-27 | Disposition: A | Discharge status: Home / Self Care | Attending: Gastroenterology | Admitting: Gastroenterology

## 2023-12-27 ENCOUNTER — Ambulatory Visit (HOSPITAL_COMMUNITY): Admitting: Anesthesiology

## 2023-12-27 ENCOUNTER — Encounter (HOSPITAL_COMMUNITY)
Admission: RE | Disposition: A | Discharge status: Home / Self Care | Payer: Self-pay | Source: Home / Self Care | Attending: Gastroenterology

## 2023-12-27 DIAGNOSIS — D510 Vitamin B12 deficiency anemia due to intrinsic factor deficiency: Secondary | ICD-10-CM | POA: Diagnosis not present

## 2023-12-27 DIAGNOSIS — E1165 Type 2 diabetes mellitus with hyperglycemia: Secondary | ICD-10-CM | POA: Diagnosis not present

## 2023-12-27 DIAGNOSIS — Z87891 Personal history of nicotine dependence: Secondary | ICD-10-CM | POA: Insufficient documentation

## 2023-12-27 DIAGNOSIS — K219 Gastro-esophageal reflux disease without esophagitis: Secondary | ICD-10-CM | POA: Insufficient documentation

## 2023-12-27 DIAGNOSIS — D509 Iron deficiency anemia, unspecified: Secondary | ICD-10-CM | POA: Insufficient documentation

## 2023-12-27 DIAGNOSIS — Z59869 Financial insecurity, unspecified: Secondary | ICD-10-CM | POA: Insufficient documentation

## 2023-12-27 DIAGNOSIS — K648 Other hemorrhoids: Secondary | ICD-10-CM | POA: Diagnosis not present

## 2023-12-27 DIAGNOSIS — K317 Polyp of stomach and duodenum: Secondary | ICD-10-CM

## 2023-12-27 DIAGNOSIS — D519 Vitamin B12 deficiency anemia, unspecified: Secondary | ICD-10-CM | POA: Diagnosis not present

## 2023-12-27 DIAGNOSIS — K3189 Other diseases of stomach and duodenum: Secondary | ICD-10-CM | POA: Diagnosis not present

## 2023-12-27 DIAGNOSIS — Z7984 Long term (current) use of oral hypoglycemic drugs: Secondary | ICD-10-CM | POA: Insufficient documentation

## 2023-12-27 DIAGNOSIS — Z800 Family history of malignant neoplasm of digestive organs: Secondary | ICD-10-CM

## 2023-12-27 DIAGNOSIS — E119 Type 2 diabetes mellitus without complications: Secondary | ICD-10-CM | POA: Diagnosis not present

## 2023-12-27 DIAGNOSIS — K640 First degree hemorrhoids: Secondary | ICD-10-CM

## 2023-12-27 DIAGNOSIS — Z1211 Encounter for screening for malignant neoplasm of colon: Secondary | ICD-10-CM

## 2023-12-27 DIAGNOSIS — D500 Iron deficiency anemia secondary to blood loss (chronic): Secondary | ICD-10-CM | POA: Insufficient documentation

## 2023-12-27 DIAGNOSIS — K2970 Gastritis, unspecified, without bleeding: Secondary | ICD-10-CM

## 2023-12-27 HISTORY — PX: ESOPHAGOGASTRODUODENOSCOPY: SHX5428

## 2023-12-27 HISTORY — PX: COLONOSCOPY: SHX5424

## 2023-12-27 LAB — CBC
HCT: 29.5 % — ABNORMAL LOW (ref 39.0–52.0)
Hemoglobin: 9.3 g/dL — ABNORMAL LOW (ref 13.0–17.0)
MCH: 28.7 pg (ref 26.0–34.0)
MCHC: 31.5 g/dL (ref 30.0–36.0)
MCV: 91 fL (ref 80.0–100.0)
Platelets: 190 K/uL (ref 150–400)
RBC: 3.24 MIL/uL — ABNORMAL LOW (ref 4.22–5.81)
RDW: 15.8 % — ABNORMAL HIGH (ref 11.5–15.5)
WBC: 5.5 K/uL (ref 4.0–10.5)
nRBC: 0 % (ref 0.0–0.2)

## 2023-12-27 LAB — BASIC METABOLIC PANEL WITH GFR
Anion gap: 10 (ref 5–15)
BUN: 16 mg/dL (ref 8–23)
CO2: 26 mmol/L (ref 22–32)
Calcium: 9 mg/dL (ref 8.9–10.3)
Chloride: 100 mmol/L (ref 98–111)
Creatinine, Ser: 1.51 mg/dL — ABNORMAL HIGH (ref 0.61–1.24)
GFR, Estimated: 50 mL/min — ABNORMAL LOW (ref >60)
Glucose, Bld: 106 mg/dL — ABNORMAL HIGH (ref 70–99)
Potassium: 4.8 mmol/L (ref 3.5–5.1)
Sodium: 136 mmol/L (ref 135–145)

## 2023-12-27 SURGERY — COLONOSCOPY
Anesthesia: General

## 2023-12-27 MED ORDER — EPHEDRINE SULFATE (PRESSORS) 25 MG/5ML IV SOSY
PREFILLED_SYRINGE | INTRAVENOUS | Status: DC | PRN
  Administered 2023-12-27: 10 mg via INTRAVENOUS

## 2023-12-27 MED ORDER — PROPOFOL 10 MG/ML IV BOLUS
INTRAVENOUS | Status: DC | PRN
  Administered 2023-12-27: 40 mg via INTRAVENOUS
  Administered 2023-12-27: 100 mg via INTRAVENOUS
  Administered 2023-12-27: 125 ug/kg/min via INTRAVENOUS

## 2023-12-27 MED ORDER — LIDOCAINE 2% (20 MG/ML) 5 ML SYRINGE
INTRAMUSCULAR | Status: DC | PRN
  Administered 2023-12-27: 40 mg via INTRAVENOUS
  Administered 2023-12-27: 60 mg via INTRAVENOUS

## 2023-12-27 MED ORDER — VASOPRESSIN 20 UNIT/ML IV SOLN
INTRAVENOUS | Status: DC | PRN
  Administered 2023-12-27 (×2): 1 [IU] via INTRAVENOUS

## 2023-12-27 MED ORDER — ATROPINE SULFATE 1 MG/ML IV SOLN
INTRAVENOUS | Status: DC | PRN
  Administered 2023-12-27: .5 mg via INTRAVENOUS

## 2023-12-27 MED ORDER — GLYCOPYRROLATE PF 0.2 MG/ML IJ SOSY
PREFILLED_SYRINGE | INTRAMUSCULAR | Status: DC | PRN
  Administered 2023-12-27: .2 mg via INTRAVENOUS

## 2023-12-27 MED ORDER — LACTATED RINGERS IV SOLN: INTRAVENOUS | Status: DC

## 2023-12-27 MED ORDER — PHENYLEPHRINE 80 MCG/ML (10ML) SYRINGE FOR IV PUSH (FOR BLOOD PRESSURE SUPPORT)
PREFILLED_SYRINGE | INTRAVENOUS | Status: DC | PRN
  Administered 2023-12-27 (×2): 160 ug via INTRAVENOUS
  Administered 2023-12-27: 80 ug via INTRAVENOUS
  Administered 2023-12-27: 160 ug via INTRAVENOUS
  Administered 2023-12-27: 80 ug via INTRAVENOUS

## 2023-12-27 NOTE — Discharge Instructions (Signed)

## 2023-12-27 NOTE — Op Note (Addendum)
 [facility] Patient Name: [name] Procedure Date: 12/27/2023 12:49 PM MRN: [id number] Date of Birth: 1955-12-28 Attending MD: Deatrice Dine , MD, [id number] CSN: [id number] Age: 68 Admit Type: Outpatient Procedure:                Colonoscopy Indications:              Screening patient at increased risk: Family history                            of colorectal cancer in multiple 1st-degree                            relatives Providers:                Deatrice Dine, MD, Harlene Lips, Madelin Hunter, RN Referring MD:             Deatrice Dine, MD Medicines:                Monitored Anesthesia Care Complications:            No immediate complications. Estimated Blood Loss:     Estimated blood loss: none. Procedure:                Pre-Anesthesia Assessment:                           - Prior to the procedure, a History and Physical                            was performed, and patient medications and                            allergies were reviewed. The patient's tolerance of                            previous anesthesia was also reviewed. The risks                            and benefits of the procedure and the sedation                            options and risks were discussed with the patient.                            All questions were answered, and informed consent                            was obtained. Prior Anticoagulants: The patient has                            taken no anticoagulant or antiplatelet agents                            except for aspirin . ASA  Grade Assessment: II - A                            patient with mild systemic disease. After reviewing                            the risks and benefits, the patient was deemed in                            satisfactory condition to undergo the procedure.                           After obtaining informed consent, the colonoscope                            was passed under  direct vision. Throughout the                            procedure, the patient's blood pressure, pulse, and                            oxygen saturations were monitored continuously. The                            [id number]-[id number] ([id number]) Colon was introduced through                            the anus and advanced to the the cecum, identified                            by its appearance. The colonoscopy was somewhat                            difficult due to poor bowel prep. The patient                            tolerated the procedure well. The quality of the                            bowel preparation was evaluated using the BBPS                            [facility] Bowel Preparation Scale) with scores of:                            Right Colon = 1 (portion of mucosa seen, but other                            areas not well seen due to staining, residual stool                            and/or opaque liquid), Transverse Colon = 1                            (  portion of mucosa seen, but other areas not well                            seen due to staining, residual stool and/or opaque                            liquid) and Left Colon = 1 (portion of mucosa seen,                            but other areas not well seen due to staining,                            residual stool and/or opaque liquid). The total                            BBPS score equals 3. The quality of the bowel                            preparation was inadequate. Scope In: 2:22:09 PM Scope Out: 2:30:43 PM Scope Withdrawal Time: 0 hours 3 minutes 19 seconds  Total Procedure Duration: 0 hours 8 minutes 34 seconds  Findings:      A large amount of stool was found in the entire colon, precluding       visualization. Lavage of the area was performed using a large amount of       sterile water, resulting in incomplete clearance with continued poor       visualization.      Non-bleeding internal hemorrhoids were found during  endoscopy. The       hemorrhoids were small. Impression:               - Preparation of the colon was inadequate.                           - Stool in the entire examined colon.                           - Non-bleeding internal hemorrhoids.                           - No specimens collected. Moderate Sedation:      Per Anesthesia Care Recommendation:           - Patient has a contact number available for                            emergencies. The signs and symptoms of potential                            delayed complications were discussed with the                            patient. Return to normal activities tomorrow.                            Written discharge instructions  were provided to the                            patient.                           - Resume previous diet.                           - Continue present medications.                           - Repeat colonoscopy at next available appointment                            (within 3 months) because the bowel preparation was                            poor. Extended bowel prep with miralax  twice daily                            10 days and 2 day complete bowel prep                           - Return to GI clinic as previously scheduled.                           -Spoke with sister [name] Cunning - [phone]                            over the phone Procedure Code(s):        --- Professional ---                           G0105, Colorectal cancer screening; colonoscopy on                            individual at high risk Diagnosis Code(s):        --- Professional ---                           Z80.0, Family history of malignant neoplasm of                            digestive organs                           K64.8, Other hemorrhoids CPT copyright 2022 American Medical Association. All rights reserved. The codes documented in this report are preliminary and upon coder review may  be revised to meet current  compliance requirements. Deatrice Dine, MD Deatrice Dine, MD 12/27/2023 2:47:22 PM This report has been signed electronically. Number of Addenda: 0

## 2023-12-27 NOTE — Anesthesia Preprocedure Evaluation (Addendum)
 Anesthesia Evaluation  Patient identified by MRN, date of birth, ID band Patient awake    Reviewed: Allergy & Precautions, NPO status , Patient's Chart, lab work & pertinent test results  Airway Mallampati: III  TM Distance: >3 FB Neck ROM: Full    Dental  (+) Poor Dentition, Missing, Dental Advisory Given   Pulmonary former smoker   Pulmonary exam normal breath sounds clear to auscultation       Cardiovascular negative cardio ROS Normal cardiovascular exam Rhythm:Regular Rate:Normal  Echo 08/2023 1. Left ventricular ejection fraction, by estimation, is 55 to 60%. The  left ventricle has normal function. The left ventricle has no regional  wall motion abnormalities. Left ventricular diastolic parameters are  indeterminate.   2. Right ventricular systolic function is normal. The right ventricular  size is normal. Tricuspid regurgitation signal is inadequate for assessing  PA pressure.   3. The aortic valve is tricuspid. There is mild calcification of the  aortic valve.   4. The inferior vena cava is normal in size with greater than 50%  respiratory variability, suggesting right atrial pressure of 3 mmHg.   5. Limited echo with no color doppler. Poor images, no valvular  vegetation visualized but images limited.     Neuro/Psych  PSYCHIATRIC DISORDERS    Schizophrenia Dementia negative neurological ROS     GI/Hepatic Neg liver ROS,GERD  Controlled,,  Endo/Other  diabetes, Well Controlled, Type 2, Oral Hypoglycemic AgentsHypothyroidism    Renal/GU Renal diseaseCr 1.69. indwelling foley catheter for urinary retention ( hypotonic bladder )  negative genitourinary   Musculoskeletal  (+) Arthritis , Osteoarthritis,    Abdominal   Peds  Hematology  (+) Blood dyscrasia, anemia Hb 7.3. will repeat   Anesthesia Other Findings History of metabolic acidosis and encephalopathy  Reproductive/Obstetrics negative OB ROS                               Anesthesia Physical Anesthesia Plan  ASA: 3  Anesthesia Plan: General   Post-op Pain Management:    Induction: Intravenous  PONV Risk Score and Plan: Propofol  infusion  Airway Management Planned: Nasal Cannula and Natural Airway  Additional Equipment: None  Intra-op Plan:   Post-operative Plan:   Informed Consent: I have reviewed the patients History and Physical, chart, labs and discussed the procedure including the risks, benefits and alternatives for the proposed anesthesia with the patient or authorized representative who has indicated his/her understanding and acceptance.     Dental advisory given  Plan Discussed with: CRNA  Anesthesia Plan Comments:          Anesthesia Quick Evaluation

## 2023-12-27 NOTE — Op Note (Addendum)
 [facility] Patient Name: [name] Procedure Date: 12/27/2023 12:50 PM MRN: [id number] Date of Birth: 1955/09/19 Attending MD: Deatrice Dine , MD, [id number] CSN: [id number] Age: 68 Admit Type: Outpatient Procedure:                Upper GI endoscopy Indications:              Unexplained iron deficiency anemia, Vitamin B12                            deficiency anemia Providers:                Deatrice Dine, MD, Harlene Lips, Madelin Hunter, RN Referring MD:             Deatrice Dine, MD Medicines:                Monitored Anesthesia Care Complications:            No immediate complications. Estimated Blood Loss:     Estimated blood loss was minimal. Procedure:                Pre-Anesthesia Assessment:                           - Prior to the procedure, a History and Physical                            was performed, and patient medications and                            allergies were reviewed. The patient's tolerance of                            previous anesthesia was also reviewed. The risks                            and benefits of the procedure and the sedation                            options and risks were discussed with the patient.                            All questions were answered, and informed consent                            was obtained. Prior Anticoagulants: The patient has                            taken no anticoagulant or antiplatelet agents                            except for aspirin . ASA Grade Assessment: III - A  patient with severe systemic disease. After                            reviewing the risks and benefits, the patient was                            deemed in satisfactory condition to undergo the                            procedure.                           After obtaining informed consent, the endoscope was                            passed under direct vision. Throughout  the                            procedure, the patient's blood pressure, pulse, and                            oxygen saturations were monitored continuously. The                            [id number] ([id number]) Upper was introduced through                            the mouth, and advanced to the second part of                            duodenum. The upper GI endoscopy was accomplished                            without difficulty. The patient tolerated the                            procedure well. Scope In: 2:05:42 PM Scope Out: 2:14:51 PM Total Procedure Duration: 0 hours 9 minutes 9 seconds  Findings:      The examined esophagus was normal.      A single 8 mm sessile polyp with bleeding and stigmata of recent       bleeding was found in the gastric body. The polyp was removed with a       cold snare. Resection and retrieval were complete. For hemostasis, one       hemostatic clip was successfully placed (MR conditional). Clip       manufacturer: [employer]. There was no bleeding at the end of the       procedure.      Two 2 to 3 mm sessile polyps with no stigmata of recent bleeding were       found in the stomach. The polyp was removed with a cold snare. Resection       and retrieval were complete.      Mild inflammation characterized by erythema was found in the gastric       antrum. Biopsies were taken with a cold forceps for  histology.      The duodenal bulb and second portion of the duodenum were normal. Impression:               - Normal esophagus.                           - A single gastric polyp. Resected and retrieved.                            Clip (MR conditional) was placed. Clip                            manufacturer: [employer].                           - Two gastric polyps. Resected and retrieved.                           - Gastritis. Biopsied.                           - Normal duodenal bulb and second portion of the                             duodenum. Moderate Sedation:      Per Anesthesia Care Recommendation:           - Patient has a contact number available for                            emergencies. The signs and symptoms of potential                            delayed complications were discussed with the                            patient. Return to normal activities tomorrow.                            Written discharge instructions were provided to the                            patient.                           - Resume previous diet.                           - Continue present medications.                           - Await pathology results.                           - Repeat upper endoscopy for surveillance based on  pathology results.                           - Return to GI clinic as previously scheduled. Procedure Code(s):        --- Professional ---                           [phone], Esophagogastroduodenoscopy, flexible,                            transoral; with removal of tumor(s), polyp(s), or                            other lesion(s) by snare technique                           43239, 59, Esophagogastroduodenoscopy, flexible,                            transoral; with biopsy, single or multiple Diagnosis Code(s):        --- Professional ---                           K31.7, Polyp of stomach and duodenum                           K29.70, Gastritis, unspecified, without bleeding                           D50.9, Iron deficiency anemia, unspecified                           D51.9, Vitamin B12 deficiency anemia, unspecified CPT copyright 2022 American Medical Association. All rights reserved. The codes documented in this report are preliminary and upon coder review may  be revised to meet current compliance requirements. Deatrice Dine, MD Deatrice Dine, MD 12/27/2023 2:21:04 PM This report has been signed electronically. Number of Addenda: 0

## 2023-12-27 NOTE — Telephone Encounter (Signed)
-----   Message from Deatrice JULIANNA Dine sent at 12/27/2023  2:47 PM EST ----- Regarding: TCS Patient had poor prep today  Please reschedule with 10 days Miralax  BID and 2 day complete bowel prep

## 2023-12-27 NOTE — H&P (Signed)
 Primary Care Physician:  [name], MD Primary Gastroenterologist:  Dr. Cinderella  Pre-Procedure History & Physical: HPI:  [name] is a 68 y.o. male with a history of dementia, schizophrenia, HTN, hypothyroidism, diabetes, chronic indwelling suprapubic catheter with bacteremia, septic arthritis and possible osteomyelitis of right hip wound presenting today for evaluation of evaluation of acute on chronic anemia     Past Medical History:  Diagnosis Date   Dementia (HCC)    Schizophrenia (HCC)     Past Surgical History:  Procedure Laterality Date   INCISION AND DRAINAGE OF DEEP ABSCESS, HIP JOINT, ANTERIOR APPROACH Right 08/25/2023   Procedure: INCISION AND DRAINAGE OF DEEP ABSCESS, HIP JOINT, ANTERIOR APPROACH;  Surgeon: Kendal Franky SQUIBB, MD;  Location: [facility] OR;  Service: Orthopedics;  Laterality: Right;   IR CATHETER TUBE CHANGE  08/05/2021   SHOULDER SURGERY      Prior to Admission medications   Medication Sig Start Date End Date Taking? Authorizing Provider  aspirin  81 MG chewable tablet Chew 81 mg by mouth daily.    [provider]  benztropine  (COGENTIN ) 2 MG tablet Take 1 tablet (2 mg total) by mouth at bedtime. 09/28/12   [name] LABOR, NP  chlorhexidine  (HIBICLENS ) 4 % external liquid Apply 15 mLs (1 Application total) topically as directed for 30 doses. Use as directed daily for 5 days every other week for 6 weeks. 08/25/23   Danton Lauraine LABOR, PA-C  DAPTOmycin  (CUBICIN ) 500 MG injection Inject 500 mg into the vein. 09/20/23   [provider]  donepezil  (ARICEPT ) 5 MG tablet Take 5 mg by mouth daily. 04/17/21   [provider]  doxycycline  (VIBRAMYCIN ) 100 MG capsule Take 1 capsule (100 mg total) by mouth 2 (two) times daily. 12/19/23   Bernard Franky, MD  ferrous sulfate  324 (65 Fe) MG TBEC Take 1 tablet (325 mg total) by mouth daily. 09/28/23   Kennedy Charmaine CROME, NP  HYDROcodone -acetaminophen  (NORCO/VICODIN) 5-325 MG tablet Take 1 tablet by mouth every 6  (six) hours as needed. 09/04/23   [provider]  levothyroxine  (SYNTHROID , LEVOTHROID) 75 MCG tablet Take 1 tablet (75 mcg total) by mouth daily. 09/28/12   [name] LABOR, NP  meloxicam (MOBIC) 15 MG tablet Take 15 mg by mouth daily. 08/23/23   [provider]  metFORMIN (GLUCOPHAGE-XR) 500 MG 24 hr tablet Take 500 mg by mouth daily. 03/13/21   [provider]  mupirocin  ointment (BACTROBAN ) 2 % Apply 1 Application topically daily. 08/25/23   [provider]  omeprazole  (PRILOSEC) 20 MG capsule Take 1 capsule (20 mg total) by mouth daily. 09/28/12   [name] LABOR, NP  oxybutynin  (DITROPAN ) 5 MG tablet Take 1 tablet (5 mg total) by mouth 3 (three) times daily. 07/15/23   Ricky Fines, MD  paliperidone  (INVEGA ) 3 MG 24 hr tablet Take 1 tablet by mouth 2 (two) times daily. 03/13/21   [provider]  polyethylene glycol (MIRALAX  / GLYCOLAX ) 17 g packet Take 17 g by mouth 2 (two) times daily. 03/20/21   [name] LABOR, MD  QUEtiapine  (SEROQUEL ) 200 MG tablet Take 600 mg by mouth at bedtime. 09/20/23   [provider]  rosuvastatin  (CRESTOR ) 5 MG tablet Take 1 tablet (5 mg total) by mouth once a week. Take 5 mg by mouth every week on Friday (please hold medication until August 16, 2023 to minimize risk of rhabdomyolysis while using daptomycin ). 08/13/23   Ricky Fines, MD  vitamin B-12 (CYANOCOBALAMIN ) 100 MCG  tablet Take 1 tablet (100 mcg total) by mouth daily. 09/28/23   Kennedy Charmaine CROME, NP    Allergies as of 12/01/2023   (No Known Allergies)    No family history on file.  Social History   Socioeconomic History   Marital status: Single    Spouse name: Not on file   Number of children: Not on file   Years of education: Not on file   Highest education level: Not on file  Occupational History   Not on file  Tobacco Use   Smoking status: Former    Current packs/day: 1.00    Types: Cigarettes    Passive exposure: Past   Smokeless tobacco: Not  on file  Vaping Use   Vaping status: Never Used  Substance and Sexual Activity   Alcohol  use: Yes    Comment: occasionaly   Drug use: No   Sexual activity: Not on file  Other Topics Concern   Not on file  Social History Narrative   Not on file   Social Drivers of Health   Financial Resource Strain: Medium Risk (05/19/2022)   Received from [facility]   Overall Financial Resource Strain (CARDIA)    Difficulty of Paying Living Expenses: Somewhat hard  Food Insecurity: No Food Insecurity (08/24/2023)   Hunger Vital Sign    Worried About Running Out of Food in the Last Year: Never true    Ran Out of Food in the Last Year: Never true  Transportation Needs: No Transportation Needs (08/24/2023)   PRAPARE - Administrator, Civil Service (Medical): No    Lack of Transportation (Non-Medical): No  Physical Activity: Not on file  Stress: Not on file  Social Connections: Unknown (08/24/2023)   Social Connection and Isolation Panel    Frequency of Communication with Friends and Family: Twice a week    Frequency of Social Gatherings with Friends and Family: Twice a week    Attends Religious Services: 1 to 4 times per year    Active Member of [employer] or Organizations: No    Attends Banker Meetings: Never    Marital Status: Patient declined  Catering Manager Violence: Not At Risk (08/24/2023)   Humiliation, Afraid, Rape, and Kick questionnaire    Fear of Current or Ex-Partner: No    Emotionally Abused: No    Physically Abused: No    Sexually Abused: No    Review of Systems: See HPI, otherwise negative ROS  Physical Exam: Vital signs in last 24 hours:     General:   Alert,  Well-developed, well-nourished, pleasant and cooperative in NAD Head:  Normocephalic and atraumatic. Eyes:  Sclera clear, no icterus.   Conjunctiva pink. Ears:  Normal auditory acuity. Nose:  No deformity, discharge,  or lesions. Msk:  Symmetrical without gross deformities. Normal  posture. Extremities:  Without clubbing or edema. Neurologic:  Alert and  oriented x4;  grossly normal neurologically. Skin:  Intact without significant lesions or rashes. Psych:  Alert and cooperative. Normal mood and affect.  Impression/Plan:   [name] is a 68 y.o. male with dementia, schizophrenia, hypertension, hypothyroidism, diabetes, chronic indwelling suprapubic catheter with bacteremia , septic arthritis who presents for evaluation of acute on chronic anemia /IDA  Proceed with upper endoscopy and colonoscopy   The risks of the procedure including infection, bleed, or perforation as well as benefits, limitations, alternatives and imponderables have been reviewed with the patient. Questions have been answered. All parties agreeable.

## 2023-12-27 NOTE — Transfer of Care (Addendum)
 Immediate Anesthesia Transfer of Care Note  Patient: [name]  Procedure(s) Performed: COLONOSCOPY EGD (ESOPHAGOGASTRODUODENOSCOPY)  Patient Location: PACU  Anesthesia Type:General  Level of Consciousness: drowsy and patient cooperative  Airway & Oxygen Therapy: Patient Spontanous Breathing  Post-op Assessment: Report given to RN and Post -op Vital signs reviewed and stable  Post vital signs: Reviewed and stable  Last Vitals:  Vitals Value Taken Time  BP 100/44 12/27/23 14:54  Temp 36.4 12/27/23   14:54  Pulse 74 12/27/23 14:57  Resp 15 12/27/23 14:57  SpO2 100 % 12/27/23 14:57  Vitals shown include unfiled device data.  Last Pain:  Vitals:   12/27/23 1400  TempSrc:   PainSc: 0-No pain      Patients Stated Pain Goal: 4 (12/27/23 1150)  Complications: No notable events documented.

## 2023-12-27 NOTE — Anesthesia Procedure Notes (Signed)
 Date/Time: 12/27/2023 2:00 PM  Performed by: Para Jerelene CROME, CRNAOxygen Delivery Method: Simple face mask Comments: POM Face Mask.

## 2023-12-28 ENCOUNTER — Encounter (HOSPITAL_COMMUNITY): Payer: Self-pay | Admitting: Gastroenterology

## 2023-12-28 NOTE — Anesthesia Postprocedure Evaluation (Signed)
 Anesthesia Post Note  Patient: [name]  Procedure(s) Performed: COLONOSCOPY EGD (ESOPHAGOGASTRODUODENOSCOPY)  Patient location during evaluation: Phase II Anesthesia Type: General Level of consciousness: awake and alert Pain management: pain level controlled Vital Signs Assessment: post-procedure vital signs reviewed and stable Respiratory status: spontaneous breathing, nonlabored ventilation and respiratory function stable Cardiovascular status: stable Anesthetic complications: no   No notable events documented.   Last Vitals:  Vitals:   12/27/23 1507 12/27/23 1517  BP: (!) 104/53 117/65  Pulse: 77   Resp: 14   Temp: 36.4 C   SpO2: 97%     Last Pain:  Vitals:   12/27/23 1507  TempSrc: Axillary  PainSc: 0-No pain                 Kenedi Cilia L Asees Manfredi

## 2023-12-28 NOTE — Telephone Encounter (Signed)
Called pt, no answer and VM not set up 

## 2023-12-29 NOTE — Telephone Encounter (Signed)
Called pt, no answer and not able to leave VM

## 2023-12-30 ENCOUNTER — Ambulatory Visit (INDEPENDENT_AMBULATORY_CARE_PROVIDER_SITE_OTHER): Payer: Self-pay | Admitting: Gastroenterology

## 2023-12-30 LAB — SURGICAL PATHOLOGY

## 2023-12-30 NOTE — Telephone Encounter (Signed)
 Letter mailed

## 2024-01-03 NOTE — Progress Notes (Signed)
 1 yr EGD noted in recall Patient result letter mailed procedure note and pathology result faxed to PCP

## 2024-01-05 ENCOUNTER — Other Ambulatory Visit: Payer: Self-pay | Admitting: Gastroenterology

## 2024-01-05 DIAGNOSIS — D649 Anemia, unspecified: Secondary | ICD-10-CM

## 2024-01-05 DIAGNOSIS — D500 Iron deficiency anemia secondary to blood loss (chronic): Secondary | ICD-10-CM

## 2024-01-10 ENCOUNTER — Other Ambulatory Visit: Payer: Self-pay | Admitting: *Deleted

## 2024-01-10 DIAGNOSIS — E538 Deficiency of other specified B group vitamins: Secondary | ICD-10-CM

## 2024-01-10 DIAGNOSIS — D500 Iron deficiency anemia secondary to blood loss (chronic): Secondary | ICD-10-CM

## 2024-01-24 ENCOUNTER — Ambulatory Visit

## 2024-01-31 ENCOUNTER — Ambulatory Visit: Admitting: Urology

## 2024-02-03 ENCOUNTER — Ambulatory Visit

## 2024-02-03 DIAGNOSIS — R339 Retention of urine, unspecified: Secondary | ICD-10-CM

## 2024-02-03 MED ORDER — CIPROFLOXACIN HCL 500 MG PO TABS
500.0000 mg | ORAL_TABLET | Freq: Once | ORAL | Status: AC
  Administered 2024-02-03: 500 mg via ORAL

## 2024-02-03 NOTE — Progress Notes (Signed)
 Suprapubic Cath Change  Patient is present today for a suprapubic catheter change due to urinary retention.  10ml of water was drained from the balloon, a 16FR foley cath was removed from the tract with out difficulty.  Suprapubic catheter site was cleaned and prepped in a sterile fashion with Betadinex3  A 16FR foley cath was replaced into the tract no complications were noted. Urine return was noted, urine Dark yellow in color . 10 ml of sterile water was inflated into the balloon and a bed bag was attached for drainage.  Patient tolerated well. A night bag was given to patient and proper instruction was given on how to switch bags.    Performed by: Exie DASEN. CMA  Follow up: as scheduled

## 2024-02-11 NOTE — ED Provider Notes (Signed)
 ------------------------------------------------------------------------------- Attestation signed by Cherie Ardeen Hanger, MD at 02/12/24 [phone] I was the attending physician on duty, and was available in Emergency Department for any consultations. I did not personally care for this patient. The patient was managed by the mid-level provider. I am co-signing the chart as the attending physician.   Signed by Ardeen SHAUNNA Cherie, MD February 12, 2024 at 5:57 AM  -------------------------------------------------------------------------------                                                                                     Emergency Department Provider Note    ED Clinical Impression   Final diagnoses:  Chronic wound (Primary)    ED Assessment/Plan    Condition: Stable Disposition: Discharge  This chart has been completed using Dragon Medical Dictation software, and while attempts have been made to ensure accuracy, certain words and phrases may not be transcribed as intended.   History   Chief Complaint  Patient presents with   Abscess   HPI  [name] is a 68 y.o. male who presents today to the emergency department complaining of possible abscess on his right hip.  He had a chronic open wound on the hip that was treated with antibiotics a couple of months ago his nephew is concerned that it may have become infected again.  The wound has healed over since he was last treated with antibiotics and family members have been cleaning and dressing the wound.  His nephew reports that he had some redness around the wound site this morning and he was concerned that it may be infected again.  Allergies: has no known allergies. Medications: has a current medication list which includes the following long-term medication(s): benztropine , donepezil , invega  sustenna, levothyroxine , paliperidone , paliperidone  palmitate, quetiapine , and rosuvastatin . PMHx:  has a past medical history of  Dementia (CMS-HCC), Diabetes mellitus    (CMS-HCC), EKG, abnormal (2017), GERD (gastroesophageal reflux disease), Hypertension, Hypothyroidism, and Schizophrenia    (CMS-HCC). PSHx:  has a past surgical history that includes Shoulder surgery (Left); Abscess drainage (Left); pr incision & drainage abscess simple/single (Right, 02/26/2021); and pr debridement subcutaneous tissue 1st 20 sq cm/< (Right, 02/26/2021). SocHx:  reports that he has quit smoking. His smoking use included cigarettes. He has never used smokeless tobacco. He reports that he does not drink alcohol  and does not use drugs. Allergies, Medications, Medical, Surgical, and Social History were reviewed as documented above.   Social Drivers of Health with Concerns   Tobacco Use: Medium Risk (12/27/2023)   Received from [facility]   Patient History    Smoking Tobacco Use: Former    Smokeless Tobacco Use: Unknown    Passive Exposure: Past  Physical Activity: Not on file  Stress: Not on file  Interpersonal Safety: Not on file  Substance Use: Not on file (12/27/2022)  Social Connections: Unknown (08/24/2023)   Received from [facility]   Social Connection and Isolation Panel    In a typical week, how many times do you talk on the phone with family, friends, or neighbors?: Twice a week    How often do you get together with friends or relatives?: Twice a week  How often do you attend church or religious services?: 1 to 4 times per year    Do you belong to any clubs or organizations such as church groups, unions, fraternal or athletic groups, or school groups?: No    How often do you attend meetings of the clubs or organizations you belong to?: Never    Are you married, widowed, divorced, separated, never married, or living with a partner?: Patient declined  Physicist, Medical Strain: Medium Risk (05/19/2022)   Overall Financial Resource Strain (CARDIA)    Difficulty of Paying Living Expenses: Somewhat hard  Health Literacy:  High Risk (02/24/2021)   Health Literacy    : Always  Internet Connectivity: Not on file     Review Of Systems  Review of Systems  Constitutional:  Negative for chills and fever.  Respiratory:  Negative for shortness of breath.   Cardiovascular:  Negative for chest pain.  Gastrointestinal:  Negative for nausea and vomiting.  Skin:  Positive for wound.  All other systems reviewed and are negative.   Physical Exam   BP 145/70   Pulse 94   Temp 36.7 C (98.1 F) (Temporal)   Resp 18   Ht 172.7 cm (5' 8)   Wt 81.6 kg (180 lb)   SpO2 100%   BMI 27.37 kg/m   Physical Exam Vitals and nursing note reviewed.  Constitutional:      General: He is not in acute distress.    Appearance: He is well-developed.  HENT:     Head: Normocephalic and atraumatic.  Cardiovascular:     Rate and Rhythm: Normal rate and regular rhythm.     Heart sounds: Normal heart sounds.  Pulmonary:     Effort: Pulmonary effort is normal.     Breath sounds: Normal breath sounds.  Skin:    General: Skin is warm and dry.     Capillary Refill: Capillary refill takes less than 2 seconds.     Findings: Wound present.     Comments: 4 cm wound site on right hip - healed over without any drainage or surrounding erythema, edema, increased temperature or TTP  Neurological:     Mental Status: He is alert and oriented to person, place, and time.  Psychiatric:        Mood and Affect: Mood normal.        Behavior: Behavior normal.     ED Course  Medical Decision Making Remove the bandage to check the wound site and there were no signs of infection.  His nephew looked at it and stated that it does not look red like it did this morning.  Given that he pointed away from the outside of the wound site and current presentation; suspect the redness was from the tape.  Area cleaned and dressing applied.  Compared to picture from 12/19/2023; wound site has improved and no longer open.  Advised Tylenol  if/as needed, to  keep the area clean and dry and to consider using paper tape; may try bordered gauze.  Follow-up with PCP.  I have reviewed my clinical findings and my clinical impression with the patient's caregiver. The patient's caregiver has expressed understanding that at this time there is no evidence for a more serious underlying process, but also understands that early in the process of a condition such as this, an initial workup can be falsely reassuring. I have counseled and discussed follow-up with the patient's caregiver, stressing the importance of appropriate follow-up. I have also counseled the patient's caregiver  to return if symptoms worsen or if there are any concerns. Routine discharge counseling was given to the patient's caregiver and the patient's caregiver understands that worsening, changing or persistent symptoms should prompt an immediate call or follow up with their primary physician or return to the emergency department for reevaluation. Patient's caregiver has expressed understanding.  Risk OTC drugs. Prescription drug management. Decision regarding hospitalization.     Procedures   No results found for this visit on 02/11/24 (from the past 4464 hours).   ED Results No results found for any visits on 02/11/24. No results found.  Medications Administered: Medications - No data to display  Discharge Medications (Medications Prescribed during this  ED visit and Patient's Home Medications) :    Your Medication List     ASK your doctor about these medications    benztropine  2 MG tablet Commonly known as: COGENTIN  Take 1 tablet (2 mg total) by mouth two (2) times a day.   donepezil  5 MG tablet Commonly known as: ARICEPT  Take 1 tablet (5 mg total) by mouth nightly.   paliperidone  palmitate 234 mg/1.5 mL Syrg Commonly known as: INVEGA  SUSTENNA Inject 1.5 mL (234 mg total) into the muscle.   INVEGA  SUSTENNA 234 mg/1.5 mL Syrg Generic drug: paliperidone  palmitate INJECT  1ML INTO THE MUSCLE EVERY FOUR WEEKS   levothyroxine  75 MCG tablet Commonly known as: SYNTHROID  TAKE ONE TABLET BY MOUTH ONCE DAILY (MORNING)   meloxicam 15 MG tablet Commonly known as: MOBIC TAKE ONE TABLET BY MOUTH DAILY (MORNING)   paliperidone  3 MG 24 hr tablet Commonly known as: INVEGA  TAKE ONE TABLET BY MOUTH EVERY MORNING AND TAKE ONE TABLET BY MOUTH AT BEDTIME   QUEtiapine  200 MG tablet Commonly known as: SEROQUEL  TAKE THREE (3) TABLETS BY MOUTH AT BEDTIME   rosuvastatin  5 MG tablet Commonly known as: CRESTOR  TAKE ONE TABLET BY MOUTH WEEKLY (WEDNESDAY BEDTIME)   senna-docusate 8.6-50 mg Commonly known as: PERICOLACE Take 1 tablet by mouth nightly as needed for constipation.          Rockey Mallick [place], [place] 02/12/24 [phone]

## 2024-03-01 NOTE — ED Provider Notes (Signed)
 ------------------------------------------------------------------------------- Attestation signed by Moody Mar, DO at 03/06/24 1134 Patient was seen by PA or NP and I was not involved in patient's work-up, care plan, treatment or disposition.  I was available for consultation at all points during patient's visit.  -------------------------------------------------------------------------------                                                                                     Emergency Department Provider Note    ED Clinical Impression   Final diagnoses:  Constipation, unspecified constipation type (Primary)    ED Assessment/Plan    Condition: Stable Disposition: Discharge  This chart has been completed using Dragon Medical Dictation software, and while attempts have been made to ensure accuracy, certain words and phrases may not be transcribed as intended.   History   Chief Complaint  Patient presents with   Constipation   HPI  [name] is a 69 y.o. male who presents today to the emergency department complaining of constipation.  He has not had a bowel movement in almost 2 weeks now.  Denies any fever, nausea, vomiting or abdominal pain.  Allergies: has no known allergies. Medications: has a current medication list which includes the following long-term medication(s): benztropine , donepezil , invega  sustenna, levothyroxine , paliperidone , paliperidone  palmitate, quetiapine , and rosuvastatin . PMHx:  has a past medical history of Dementia (CMS-HCC), Diabetes mellitus    (CMS-HCC), EKG, abnormal (2017), GERD (gastroesophageal reflux disease), Hypertension, Hypothyroidism, and Schizophrenia    (CMS-HCC). PSHx:  has a past surgical history that includes Shoulder surgery (Left); Abscess drainage (Left); pr incision & drainage abscess simple/single (Right, 02/26/2021); and pr debridement subcutaneous tissue 1st 20 sq cm/< (Right, 02/26/2021). SocHx:  reports that he has  quit smoking. His smoking use included cigarettes. He has never used smokeless tobacco. He reports that he does not drink alcohol  and does not use drugs. Allergies, Medications, Medical, Surgical, and Social History were reviewed as documented above.   Social Drivers of Health with Concerns   Tobacco Use: Medium Risk (12/27/2023)   Received from [facility]   Patient History    Smoking Tobacco Use: Former    Smokeless Tobacco Use: Unknown    Passive Exposure: Past  Physical Activity: Not on file  Stress: Not on file  Interpersonal Safety: Not on file  Substance Use: Not on file (12/27/2022)  Social Connections: Unknown (08/24/2023)   Received from [facility]   Social Connection and Isolation Panel    In a typical week, how many times do you talk on the phone with family, friends, or neighbors?: Twice a week    How often do you get together with friends or relatives?: Twice a week    How often do you attend church or religious services?: 1 to 4 times per year    Do you belong to any clubs or organizations such as church groups, unions, fraternal or athletic groups, or school groups?: No    How often do you attend meetings of the clubs or organizations you belong to?: Never    Are you married, widowed, divorced, separated, never married, or living with a partner?: Patient declined  Physicist, Medical Strain: Medium Risk (05/19/2022)  Overall Financial Resource Strain (CARDIA)    Difficulty of Paying Living Expenses: Somewhat hard  Health Literacy: High Risk (02/24/2021)   Health Literacy    : Always  Internet Connectivity: Not on file     Review Of Systems  Review of Systems  Constitutional:  Negative for chills and fever.  Respiratory:  Negative for shortness of breath.   Cardiovascular:  Negative for chest pain.  Gastrointestinal:  Positive for constipation. Negative for nausea and vomiting.  All other systems reviewed and are negative.   Physical Exam   BP 134/70    Pulse 106   Temp 36.6 C (97.8 F) (Oral)   Resp 14   Ht 172.7 cm (5' 8)   Wt 83 kg (183 lb)   SpO2 97%   BMI 27.83 kg/m   Physical Exam Vitals and nursing note reviewed.  Constitutional:      General: He is not in acute distress.    Appearance: He is well-developed.  HENT:     Head: Normocephalic and atraumatic.  Cardiovascular:     Rate and Rhythm: Normal rate and regular rhythm.     Heart sounds: Normal heart sounds.  Pulmonary:     Effort: Pulmonary effort is normal.     Breath sounds: Normal breath sounds.  Abdominal:     General: Abdomen is flat. Bowel sounds are normal.     Palpations: Abdomen is soft.     Tenderness: There is no abdominal tenderness.  Neurological:     Mental Status: He is alert and oriented to person, place, and time.  Psychiatric:        Mood and Affect: Mood normal.        Behavior: Behavior normal.     ED Course  Medical Decision Making Reviewed and discussed x-ray results.  Will treat with lactulose and advised Tylenol  if/as needed, increased hydration and increase dietary fiber.  Follow-up with PCP.  I have reviewed my clinical findings and my clinical impression with the patient. The patient has expressed understanding that at this time there is no evidence for a more serious underlying process, but also understands that early in the process of a condition such as this, an initial workup can be falsely reassuring. I have counseled and discussed follow-up with the patient, stressing the importance of appropriate follow-up. I have also counseled the patient to return if symptoms worsen or if there are any concerns. Routine discharge counseling was given to the patient and the patient understands that worsening, changing or persistent symptoms should prompt an immediate call or follow up with their primary physician or return to the emergency department for reevaluation. Patient has expressed understanding.  Amount and/or Complexity of Data  Reviewed Radiology: ordered. Decision-making details documented in ED Course.  Risk OTC drugs. Prescription drug management. Decision regarding hospitalization. Diagnosis or treatment significantly limited by social determinants of health.     Procedures   No results found for this visit on 03/01/24 (from the past 4464 hours).   ED Results No results found for any visits on 03/01/24. XR Abdomen 2 Views and Chest 1 View Result Date: 03/01/2024 Exam: Abdomen 3 Views  History:  Constipation  Technique:  Three views.  Comparison:  Abdominal radiograph 05/20/2022  Findings:  Large amount of stool scattered throughout the colon. No abnormally dilated air-filled loops of small bowel. Clips in the left upper abdomen. 10 mm calcific density projects over the region of the left kidney.   The frontal view of the  chest reveals normal heart size and pulmonary vascularity.   The lungs are clear.  Mediastinal contours are normal. There is no pleural effusion or pneumothorax. The regional bones appear normal for age.   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    Large amount of stool scattered throughout the colon. No abnormally dilated air-filled loops of small bowel.  Signed (Electronic Signature): 03/01/2024 12:02 PM Signed By: Kristopher Tantillo, MD   Medications Administered: Medications - No data to display  Discharge Medications (Medications Prescribed during this  ED visit and Patient's Home Medications) :    Your Medication List     START taking these medications    lactulose 20 gram/30 mL Soln Take 30 mL (20 g total) by mouth daily as needed.       ASK your doctor about these medications    benztropine  2 MG tablet Commonly known as: COGENTIN  Take 1 tablet (2 mg total) by mouth two (2) times a day.   donepezil  5 MG tablet Commonly known as: ARICEPT  Take 1 tablet (5 mg total) by mouth nightly.   paliperidone  palmitate 234 mg/1.5 mL Syrg Commonly  known as: INVEGA  SUSTENNA Inject 1.5 mL (234 mg total) into the muscle.   INVEGA  SUSTENNA 234 mg/1.5 mL Syrg Generic drug: paliperidone  palmitate INJECT 1ML INTO THE MUSCLE EVERY FOUR WEEKS   levothyroxine  75 MCG tablet Commonly known as: SYNTHROID  TAKE ONE TABLET BY MOUTH ONCE DAILY (MORNING)   meloxicam 15 MG tablet Commonly known as: MOBIC TAKE ONE TABLET BY MOUTH DAILY (MORNING)   paliperidone  3 MG 24 hr tablet Commonly known as: INVEGA  TAKE ONE TABLET BY MOUTH EVERY MORNING AND TAKE ONE TABLET BY MOUTH AT BEDTIME   QUEtiapine  200 MG tablet Commonly known as: SEROQUEL  TAKE THREE (3) TABLETS BY MOUTH AT BEDTIME   rosuvastatin  5 MG tablet Commonly known as: CRESTOR  TAKE ONE TABLET BY MOUTH WEEKLY (WEDNESDAY BEDTIME)   senna-docusate 8.6-50 mg Commonly known as: PERICOLACE Take 1 tablet by mouth nightly as needed for constipation.          Rockey Alyce Ditch, [place] 03/01/24 2241

## 2024-03-12 NOTE — Progress Notes (Unsigned)
 "   Assessment:  1.  Neurogenic bladder with indwelling suprapubic tube-doing well with this  2.  Granulation tissue of tube entry site, previously cauterized.  No bleeding now   Plan: 1.  Continue monthly catheter changes  2.  He will stop in to see a doctor at 6 months  History of Present Illness:   He presented to [facility] on 03/17/2021 with altered mental status.  He does have a history of schizophrenia.  He was found to have a distended abdomen on presentation.  A Foley catheter was placed with return of 2300 mL of urine.  CT imaging from 03/18/2021 demonstrated no stones or hydronephrosis, no renal masses, and a large stool burden throughout the colon.  He was started on Flomax .  He was treated for constipation.  His creatinine was 2.69 on admission and improved to 0.83 prior to discharge on 03/20/2021. At his initial visit on 03/27/2021, his Foley catheter was draining well.  He continued to have problems with constipation.  He was taking MiraLAX  and Senokot S.  No prior history of retention. His nephew reports that he has been pulling at the Foley catheter during periods of confusion. A voiding trial was attempted on 03/27/2021 but was unsuccessful and the catheter was replaced. The patient returned for another voiding trial on 04/06/2021.  He was unable to void in the office and returned later that day unable to void requiring replacement of a Foley catheter.  At his visit on 04/13/21, he continued with a foley catheter.  He continued to have issues with constipation although he had multiple bowel movements approximately 4 days prior.  No recent bowel movement.  He continues on MiraLAX  and Senokot.  He also continued to take Flomax  daily. He was continued with the Foley catheter.  He was changed to Rapaflo  8 mg daily in place of tamsulosin . Urine culture grew >100 K MRSA.  He was treated with Bactrim .  He was evaluated with cystoscopy on 04/29/2021.  Cystoscopy showed no significant  enlargement of the prostate, a large capacity bladder with few trabeculations, and mucosal edema consistent with chronic Foley catheter irritation.  He was unable to void following the procedure. He underwent evaluation with urodynamics on 05/25/2021.  The patient had a bladder capacity of 987 mL with decreased sensation.  There was instability noted with leakage.  The patient was able to generate a slight voluntary contraction and void a small amount with straining.  Max detrusor pressure was 20 cm of water with a max flow of 5 mL/s.  He underwent placement of a suprapubic catheter by interventional radiology on 06/23/2021. The patient pulled out his suprapubic catheter on 08/03/2021.  He underwent replacement of a suprapubic catheter by interventional radiology on 08/05/2021.  He again pulled out his catheter on 08/23/2021.  The catheter was replaced in the emergency department. Urine culture showed no growth. His SP tube was changed on 09/24/2021 with placement of a 16 French catheter.  He presents today for change of his suprapubic catheter.  His tube has been draining well.  No recent hematuria, fevers, or chills.  He continues to have some mucus drainage around the SP tube site.  11.12.2024: He was seen for redness around his catheter.  He had significant amount of granulation tissue at the suprapubic site which was cauterized.  He was placed on doxycycline .  12.10.2024: Here today for recheck.  He is no longer having tenderness or redness at the suprapubic tube site.  6.10.2025: Here for  catheter change and every 6 month check. Apparently he has been treated for 1 UTI since his visit with me 6 months ago.  No problems with catheter changes or recurrent granulation tissue.  1.20.2026: Here for catheter change and doctor visit no real problems with catheter changes.  Occasional blood at the suprapubic site.  No recent UTIs.  Past Medical History:  Past Medical History:  Diagnosis Date   Dementia  (HCC)    Schizophrenia [facility])     Past Surgical History:  Past Surgical History:  Procedure Laterality Date   COLONOSCOPY N/A 12/27/2023   Procedure: COLONOSCOPY;  Surgeon: Cinderella Deatrice FALCON, MD;  Location: [facility]ITE;  Service: Endoscopy;  Laterality: N/A;  1:45 pm, asa 3   ESOPHAGOGASTRODUODENOSCOPY N/A 12/27/2023   Procedure: EGD (ESOPHAGOGASTRODUODENOSCOPY);  Surgeon: Cinderella Deatrice FALCON, MD;  Location: [facility];  Service: Endoscopy;  Laterality: N/A;   INCISION AND DRAINAGE OF DEEP ABSCESS, HIP JOINT, ANTERIOR APPROACH Right 08/25/2023   Procedure: INCISION AND DRAINAGE OF DEEP ABSCESS, HIP JOINT, ANTERIOR APPROACH;  Surgeon: Kendal Franky SQUIBB, MD;  Location: [facility] OR;  Service: Orthopedics;  Laterality: Right;   IR CATHETER TUBE CHANGE  08/05/2021   SHOULDER SURGERY      Allergies:  No Known Allergies  Family History:  No family history on file.  Social History:  Social History   Tobacco Use   Smoking status: Former    Current packs/day: 1.00    Types: Cigarettes    Passive exposure: Past  Vaping Use   Vaping status: Never Used  Substance Use Topics   Alcohol  use: Yes    Comment: occasionaly   Drug use: No    ROS: Constitutional:  Negative for fever, chills, weight loss CV: Negative for chest pain, previous MI, hypertension Respiratory:  Negative for shortness of breath, wheezing, sleep apnea, frequent cough GI:  Negative for nausea, vomiting, bloody stool, GERD   Physical exam: There were no vitals taken for this visit. GENERAL APPEARANCE:  Well appearing, well developed, well nourished, NAD HEENT:  Atraumatic, normocephalic, oropharynx clear NECK:  Supple without lymphadenopathy or thyromegaly ABDOMEN: Suprapubic tube site is clean.  Minimal granulation tissue.  No erythema. EXTREMITIES:  Moves all extremities well, without clubbing, cyanosis, or edema NEUROLOGIC:  Alert and oriented x 3, CN II-XII grossly intact MENTAL STATUS:  appropriate BACK:  Non-tender  to palpation, No CVAT SKIN:  Warm, dry, and intact    "

## 2024-03-13 ENCOUNTER — Ambulatory Visit: Admitting: Urology

## 2024-03-13 VITALS — BP 94/61 | HR 120

## 2024-03-13 DIAGNOSIS — L929 Granulomatous disorder of the skin and subcutaneous tissue, unspecified: Secondary | ICD-10-CM

## 2024-03-13 DIAGNOSIS — N319 Neuromuscular dysfunction of bladder, unspecified: Secondary | ICD-10-CM | POA: Diagnosis not present

## 2024-03-13 DIAGNOSIS — N312 Flaccid neuropathic bladder, not elsewhere classified: Secondary | ICD-10-CM

## 2024-03-13 DIAGNOSIS — R339 Retention of urine, unspecified: Secondary | ICD-10-CM

## 2024-03-13 DIAGNOSIS — Z435 Encounter for attention to cystostomy: Secondary | ICD-10-CM

## 2024-03-13 DIAGNOSIS — Z960 Presence of urogenital implants: Secondary | ICD-10-CM

## 2024-03-13 MED ORDER — CIPROFLOXACIN HCL 500 MG PO TABS
500.0000 mg | ORAL_TABLET | Freq: Once | ORAL | Status: AC
  Administered 2024-03-13: 500 mg via ORAL

## 2024-03-13 NOTE — Progress Notes (Signed)
 Suprapubic Cath Change  Patient is present today for a suprapubic catheter change due to urinary retention.  10ml of water was drained from the balloon, a 16  FR foley cath was removed from the tract with out difficulty.  Suprapubic catheter site was cleaned and prepped in a sterile fashion with [id number]  A 16 FR foley cath was replaced into the tract no complications were noted. Urine return was noted, urine Dark yellow in color . 10 ml of sterile water was inflated into the balloon and a bed bag was attached for drainage.  Patient tolerated well. A night bag was given to patient and proper instruction was given on how to switch bags.    Performed by: Exie DASEN. CMA  Follow up: 4 weeks SP catheter change

## 2024-04-17 ENCOUNTER — Ambulatory Visit

## 2024-09-26 ENCOUNTER — Ambulatory Visit: Admitting: Urology
